# Patient Record
Sex: Female | Born: 1935 | Race: White | Hispanic: No | Smoking: Former smoker
Health system: Southern US, Community
[De-identification: ages and names within clinical notes are randomized; demographics above are authoritative.]

## PROBLEM LIST (undated history)

## (undated) DIAGNOSIS — I872 Venous insufficiency (chronic) (peripheral): Secondary | ICD-10-CM

## (undated) DIAGNOSIS — I350 Nonrheumatic aortic (valve) stenosis: Secondary | ICD-10-CM

## (undated) DIAGNOSIS — Z7901 Long term (current) use of anticoagulants: Secondary | ICD-10-CM

## (undated) DIAGNOSIS — G459 Transient cerebral ischemic attack, unspecified: Secondary | ICD-10-CM

## (undated) DIAGNOSIS — I482 Chronic atrial fibrillation, unspecified: Secondary | ICD-10-CM

## (undated) DIAGNOSIS — Z950 Presence of cardiac pacemaker: Secondary | ICD-10-CM

## (undated) DIAGNOSIS — E079 Disorder of thyroid, unspecified: Secondary | ICD-10-CM

## (undated) DIAGNOSIS — S42009A Fracture of unspecified part of unspecified clavicle, initial encounter for closed fracture: Secondary | ICD-10-CM

## (undated) DIAGNOSIS — M1990 Unspecified osteoarthritis, unspecified site: Secondary | ICD-10-CM

## (undated) DIAGNOSIS — T4145XA Adverse effect of unspecified anesthetic, initial encounter: Secondary | ICD-10-CM

## (undated) DIAGNOSIS — I499 Cardiac arrhythmia, unspecified: Secondary | ICD-10-CM

## (undated) DIAGNOSIS — T8859XA Other complications of anesthesia, initial encounter: Secondary | ICD-10-CM

## (undated) DIAGNOSIS — I495 Sick sinus syndrome: Secondary | ICD-10-CM

## (undated) HISTORY — PX: INSERT / REPLACE / REMOVE PACEMAKER: SUR710

## (undated) HISTORY — PX: ABDOMINAL HYSTERECTOMY: SHX81

## (undated) HISTORY — PX: REPLACEMENT TOTAL KNEE BILATERAL: SUR1225

## (undated) HISTORY — PX: VARICOSE VEIN SURGERY: SHX832

## (undated) HISTORY — DX: Venous insufficiency (chronic) (peripheral): I87.2

## (undated) HISTORY — DX: Sick sinus syndrome: I49.5

## (undated) HISTORY — PX: PACEMAKER INSERTION: SHX728

## (undated) HISTORY — DX: Nonrheumatic aortic (valve) stenosis: I35.0

## (undated) HISTORY — DX: Long term (current) use of anticoagulants: Z79.01

## (undated) HISTORY — DX: Unspecified osteoarthritis, unspecified site: M19.90

## (undated) HISTORY — DX: Disorder of thyroid, unspecified: E07.9

## (undated) HISTORY — DX: Chronic atrial fibrillation, unspecified: I48.20

## (undated) HISTORY — DX: Presence of cardiac pacemaker: Z95.0

## (undated) HISTORY — DX: Transient cerebral ischemic attack, unspecified: G45.9

## (undated) HISTORY — PX: KNEE SURGERY: SHX244

## (undated) HISTORY — DX: Cardiac arrhythmia, unspecified: I49.9

---

## 1998-12-19 ENCOUNTER — Ambulatory Visit (HOSPITAL_COMMUNITY): Admission: RE | Admit: 1998-12-19 | Discharge: 1998-12-19 | Payer: Self-pay | Admitting: Obstetrics & Gynecology

## 1998-12-23 ENCOUNTER — Encounter: Payer: Self-pay | Admitting: Orthopedic Surgery

## 1998-12-30 ENCOUNTER — Inpatient Hospital Stay (HOSPITAL_COMMUNITY): Admission: RE | Admit: 1998-12-30 | Discharge: 1999-01-06 | Payer: Self-pay | Admitting: Orthopedic Surgery

## 1999-04-06 ENCOUNTER — Ambulatory Visit (HOSPITAL_COMMUNITY): Admission: RE | Admit: 1999-04-06 | Discharge: 1999-04-06 | Payer: Self-pay | Admitting: Cardiology

## 1999-05-29 ENCOUNTER — Inpatient Hospital Stay (HOSPITAL_COMMUNITY): Admission: RE | Admit: 1999-05-29 | Discharge: 1999-06-01 | Payer: Self-pay | Admitting: Orthopedic Surgery

## 1999-06-01 ENCOUNTER — Inpatient Hospital Stay (HOSPITAL_COMMUNITY)
Admission: RE | Admit: 1999-06-01 | Discharge: 1999-06-08 | Payer: Self-pay | Admitting: Physical Medicine & Rehabilitation

## 2001-08-03 ENCOUNTER — Other Ambulatory Visit: Admission: RE | Admit: 2001-08-03 | Discharge: 2001-08-03 | Payer: Self-pay | Admitting: *Deleted

## 2001-08-16 ENCOUNTER — Encounter: Payer: Self-pay | Admitting: Orthopedic Surgery

## 2001-08-16 ENCOUNTER — Encounter: Admission: RE | Admit: 2001-08-16 | Discharge: 2001-08-16 | Payer: Self-pay | Admitting: Orthopedic Surgery

## 2002-01-07 ENCOUNTER — Emergency Department (HOSPITAL_COMMUNITY): Admission: EM | Admit: 2002-01-07 | Discharge: 2002-01-07 | Payer: Self-pay | Admitting: Emergency Medicine

## 2002-10-22 ENCOUNTER — Emergency Department (HOSPITAL_COMMUNITY): Admission: EM | Admit: 2002-10-22 | Discharge: 2002-10-22 | Payer: Self-pay | Admitting: Emergency Medicine

## 2003-01-04 ENCOUNTER — Emergency Department (HOSPITAL_COMMUNITY): Admission: EM | Admit: 2003-01-04 | Discharge: 2003-01-04 | Payer: Self-pay | Admitting: Emergency Medicine

## 2003-01-04 ENCOUNTER — Encounter: Payer: Self-pay | Admitting: Emergency Medicine

## 2005-08-06 ENCOUNTER — Encounter: Admission: RE | Admit: 2005-08-06 | Discharge: 2005-08-06 | Payer: Self-pay | Admitting: Cardiology

## 2007-09-29 ENCOUNTER — Encounter: Admission: RE | Admit: 2007-09-29 | Discharge: 2007-09-29 | Payer: Self-pay | Admitting: Cardiology

## 2008-01-08 ENCOUNTER — Ambulatory Visit: Payer: Self-pay | Admitting: Vascular Surgery

## 2008-01-26 ENCOUNTER — Ambulatory Visit: Payer: Self-pay | Admitting: Vascular Surgery

## 2008-03-29 ENCOUNTER — Ambulatory Visit: Payer: Self-pay | Admitting: Vascular Surgery

## 2008-04-24 ENCOUNTER — Ambulatory Visit: Payer: Self-pay | Admitting: Vascular Surgery

## 2008-05-01 ENCOUNTER — Ambulatory Visit: Payer: Self-pay | Admitting: Vascular Surgery

## 2008-08-05 ENCOUNTER — Encounter: Admission: RE | Admit: 2008-08-05 | Discharge: 2008-08-05 | Payer: Self-pay | Admitting: Cardiology

## 2009-04-14 ENCOUNTER — Encounter: Admission: RE | Admit: 2009-04-14 | Discharge: 2009-04-14 | Payer: Self-pay | Admitting: Cardiology

## 2009-04-23 ENCOUNTER — Ambulatory Visit (HOSPITAL_COMMUNITY): Admission: RE | Admit: 2009-04-23 | Discharge: 2009-04-23 | Payer: Self-pay | Admitting: Cardiology

## 2009-07-31 ENCOUNTER — Ambulatory Visit (HOSPITAL_COMMUNITY): Admission: RE | Admit: 2009-07-31 | Discharge: 2009-07-31 | Payer: Self-pay | Admitting: Internal Medicine

## 2009-11-03 ENCOUNTER — Encounter (INDEPENDENT_AMBULATORY_CARE_PROVIDER_SITE_OTHER): Payer: Self-pay | Admitting: Surgery

## 2009-11-03 ENCOUNTER — Ambulatory Visit (HOSPITAL_COMMUNITY): Admission: RE | Admit: 2009-11-03 | Discharge: 2009-11-04 | Payer: Self-pay | Admitting: Surgery

## 2010-03-05 ENCOUNTER — Ambulatory Visit (HOSPITAL_COMMUNITY)
Admission: RE | Admit: 2010-03-05 | Discharge: 2010-03-05 | Payer: Self-pay | Source: Home / Self Care | Attending: Orthopedic Surgery | Admitting: Orthopedic Surgery

## 2010-05-27 ENCOUNTER — Other Ambulatory Visit: Payer: Self-pay | Admitting: Orthopedic Surgery

## 2010-05-27 ENCOUNTER — Encounter (HOSPITAL_COMMUNITY): Payer: Medicare Other

## 2010-05-27 ENCOUNTER — Ambulatory Visit (HOSPITAL_COMMUNITY)
Admission: RE | Admit: 2010-05-27 | Discharge: 2010-05-27 | Disposition: A | Discharge status: Home / Self Care | Payer: Medicare Other | Source: Ambulatory Visit | Attending: Orthopedic Surgery | Admitting: Orthopedic Surgery

## 2010-05-27 ENCOUNTER — Other Ambulatory Visit (HOSPITAL_COMMUNITY): Payer: Self-pay | Admitting: Orthopedic Surgery

## 2010-05-27 DIAGNOSIS — J449 Chronic obstructive pulmonary disease, unspecified: Secondary | ICD-10-CM | POA: Insufficient documentation

## 2010-05-27 DIAGNOSIS — Z950 Presence of cardiac pacemaker: Secondary | ICD-10-CM | POA: Insufficient documentation

## 2010-05-27 DIAGNOSIS — I517 Cardiomegaly: Secondary | ICD-10-CM | POA: Insufficient documentation

## 2010-05-27 DIAGNOSIS — Z01812 Encounter for preprocedural laboratory examination: Secondary | ICD-10-CM | POA: Insufficient documentation

## 2010-05-27 DIAGNOSIS — Z01818 Encounter for other preprocedural examination: Secondary | ICD-10-CM

## 2010-05-27 DIAGNOSIS — J4489 Other specified chronic obstructive pulmonary disease: Secondary | ICD-10-CM | POA: Insufficient documentation

## 2010-05-27 DIAGNOSIS — I4891 Unspecified atrial fibrillation: Secondary | ICD-10-CM | POA: Insufficient documentation

## 2010-05-27 LAB — URINALYSIS, ROUTINE W REFLEX MICROSCOPIC
Bilirubin Urine: NEGATIVE
Glucose, UA: NEGATIVE mg/dL
Ketones, ur: NEGATIVE mg/dL
Protein, ur: NEGATIVE mg/dL
pH: 7.5 (ref 5.0–8.0)

## 2010-05-27 LAB — CBC
MCHC: 31.6 g/dL (ref 30.0–36.0)
MCV: 82.3 fL (ref 78.0–100.0)
Platelets: 351 10*3/uL (ref 150–400)
WBC: 8.2 10*3/uL (ref 4.0–10.5)

## 2010-05-27 LAB — COMPREHENSIVE METABOLIC PANEL
AST: 15 U/L (ref 0–37)
Albumin: 3.8 g/dL (ref 3.5–5.2)
Alkaline Phosphatase: 86 U/L (ref 39–117)
BUN: 13 mg/dL (ref 6–23)
CO2: 30 mEq/L (ref 19–32)
Calcium: 9 mg/dL (ref 8.4–10.5)
Chloride: 98 mEq/L (ref 96–112)
GFR calc non Af Amer: >60 mL/min (ref >60)
Glucose, Bld: 91 mg/dL (ref 70–99)
Total Bilirubin: 0.5 mg/dL (ref 0.3–1.2)
Total Protein: 7.7 g/dL (ref 6.0–8.3)

## 2010-05-27 LAB — PROTIME-INR: Prothrombin Time: 27 seconds — ABNORMAL HIGH (ref 11.6–15.2)

## 2010-05-27 LAB — SURGICAL PCR SCREEN: MRSA, PCR: NEGATIVE

## 2010-06-03 ENCOUNTER — Other Ambulatory Visit: Payer: Self-pay | Admitting: Orthopedic Surgery

## 2010-06-03 ENCOUNTER — Inpatient Hospital Stay (HOSPITAL_COMMUNITY)
Admission: RE | Admit: 2010-06-03 | Discharge: 2010-06-08 | DRG: 468 | Disposition: A | Discharge status: Home Health | Payer: Medicare Other | Source: Ambulatory Visit | Attending: Orthopedic Surgery | Admitting: Orthopedic Surgery

## 2010-06-03 DIAGNOSIS — E039 Hypothyroidism, unspecified: Secondary | ICD-10-CM | POA: Diagnosis present

## 2010-06-03 DIAGNOSIS — M1710 Unilateral primary osteoarthritis, unspecified knee: Secondary | ICD-10-CM | POA: Diagnosis present

## 2010-06-03 DIAGNOSIS — I4891 Unspecified atrial fibrillation: Secondary | ICD-10-CM | POA: Diagnosis present

## 2010-06-03 DIAGNOSIS — Z950 Presence of cardiac pacemaker: Secondary | ICD-10-CM

## 2010-06-03 DIAGNOSIS — T84099A Other mechanical complication of unspecified internal joint prosthesis, initial encounter: Principal | ICD-10-CM | POA: Diagnosis present

## 2010-06-03 DIAGNOSIS — Z96659 Presence of unspecified artificial knee joint: Secondary | ICD-10-CM

## 2010-06-03 DIAGNOSIS — R0681 Apnea, not elsewhere classified: Secondary | ICD-10-CM | POA: Diagnosis not present

## 2010-06-03 DIAGNOSIS — Z01818 Encounter for other preprocedural examination: Secondary | ICD-10-CM

## 2010-06-03 DIAGNOSIS — Y831 Surgical operation with implant of artificial internal device as the cause of abnormal reaction of the patient, or of later complication, without mention of misadventure at the time of the procedure: Secondary | ICD-10-CM | POA: Diagnosis present

## 2010-06-03 LAB — GRAM STAIN

## 2010-06-03 LAB — ABO/RH: ABO/RH(D): O NEG

## 2010-06-04 LAB — CBC
Hemoglobin: 9.3 g/dL — ABNORMAL LOW (ref 12.0–15.0)
MCH: 25.4 pg — ABNORMAL LOW (ref 26.0–34.0)
Platelets: 242 10*3/uL (ref 150–400)
RBC: 3.66 MIL/uL — ABNORMAL LOW (ref 3.87–5.11)
RDW: 15.7 % — ABNORMAL HIGH (ref 11.5–15.5)

## 2010-06-04 LAB — BASIC METABOLIC PANEL
BUN: 12 mg/dL (ref 6–23)
CO2: 28 mEq/L (ref 19–32)
Chloride: 100 mEq/L (ref 96–112)
Creatinine, Ser: 0.69 mg/dL (ref 0.4–1.2)
GFR calc Af Amer: >60 mL/min (ref >60)
Glucose, Bld: 166 mg/dL — ABNORMAL HIGH (ref 70–99)

## 2010-06-04 LAB — PROTIME-INR: Prothrombin Time: 17.1 seconds — ABNORMAL HIGH (ref 11.6–15.2)

## 2010-06-05 LAB — BASIC METABOLIC PANEL
BUN: 7 mg/dL (ref 6–23)
BUN: 14 mg/dL (ref 6–23)
CO2: 29 mEq/L (ref 19–32)
Chloride: 96 mEq/L (ref 96–112)
Creatinine, Ser: 0.68 mg/dL (ref 0.4–1.2)
GFR calc Af Amer: >60 mL/min (ref >60)
GFR calc non Af Amer: >60 mL/min (ref >60)
GFR calc non Af Amer: >60 mL/min (ref >60)
Glucose, Bld: 124 mg/dL — ABNORMAL HIGH (ref 70–99)
Glucose, Bld: 93 mg/dL (ref 70–99)
Potassium: 4.4 mEq/L (ref 3.5–5.1)

## 2010-06-05 LAB — PROTIME-INR
INR: 1.97 — ABNORMAL HIGH (ref 0.00–1.49)
INR: 1.26 (ref 0.00–1.49)
Prothrombin Time: 16 seconds — ABNORMAL HIGH (ref 11.6–15.2)

## 2010-06-05 LAB — CBC
HCT: 29.5 % — ABNORMAL LOW (ref 36.0–46.0)
HCT: 38.7 % (ref 36.0–46.0)
Hemoglobin: 9.2 g/dL — ABNORMAL LOW (ref 12.0–15.0)
Hemoglobin: 13 g/dL (ref 12.0–15.0)
MCH: 28.1 pg (ref 26.0–34.0)
MCHC: 33.6 g/dL (ref 30.0–36.0)
MCV: 82.2 fL (ref 78.0–100.0)
RBC: 3.59 MIL/uL — ABNORMAL LOW (ref 3.87–5.11)
RDW: 16.6 % — ABNORMAL HIGH (ref 11.5–15.5)
WBC: 11 10*3/uL — ABNORMAL HIGH (ref 4.0–10.5)

## 2010-06-05 LAB — APTT: aPTT: 33 seconds (ref 24–37)

## 2010-06-05 LAB — URINALYSIS, ROUTINE W REFLEX MICROSCOPIC
Bilirubin Urine: NEGATIVE
Hgb urine dipstick: NEGATIVE
Ketones, ur: NEGATIVE mg/dL
Nitrite: NEGATIVE
Urobilinogen, UA: 0.2 mg/dL (ref 0.0–1.0)
pH: 7 (ref 5.0–8.0)

## 2010-06-05 LAB — DIFFERENTIAL
Basophils Absolute: 0.1 10*3/uL (ref 0.0–0.1)
Basophils Relative: 1 % (ref 0–1)
Eosinophils Absolute: 0.1 10*3/uL (ref 0.0–0.7)
Eosinophils Relative: 1 % (ref 0–5)
Monocytes Absolute: 0.8 10*3/uL (ref 0.1–1.0)
Monocytes Relative: 12 % (ref 3–12)
Neutro Abs: 4.8 10*3/uL (ref 1.7–7.7)

## 2010-06-05 LAB — SURGICAL PCR SCREEN: Staphylococcus aureus: NEGATIVE

## 2010-06-05 LAB — CALCIUM: Calcium: 9.6 mg/dL (ref 8.4–10.5)

## 2010-06-06 LAB — TISSUE CULTURE

## 2010-06-06 LAB — CBC
HCT: 28.7 % — ABNORMAL LOW (ref 36.0–46.0)
Hemoglobin: 9.2 g/dL — ABNORMAL LOW (ref 12.0–15.0)
MCH: 26.1 pg (ref 26.0–34.0)
MCHC: 32.1 g/dL (ref 30.0–36.0)
MCV: 81.3 fL (ref 78.0–100.0)
RBC: 3.53 MIL/uL — ABNORMAL LOW (ref 3.87–5.11)

## 2010-06-06 LAB — BASIC METABOLIC PANEL
CO2: 28 mEq/L (ref 19–32)
Calcium: 8 mg/dL — ABNORMAL LOW (ref 8.4–10.5)
Chloride: 102 mEq/L (ref 96–112)
Creatinine, Ser: 0.75 mg/dL (ref 0.4–1.2)
GFR calc Af Amer: >60 mL/min (ref >60)
Glucose, Bld: 102 mg/dL — ABNORMAL HIGH (ref 70–99)

## 2010-06-06 LAB — PROTIME-INR: INR: 1.76 — ABNORMAL HIGH (ref 0.00–1.49)

## 2010-06-06 LAB — WOUND CULTURE

## 2010-06-08 LAB — ANAEROBIC CULTURE

## 2010-06-08 LAB — PROTIME-INR
INR: 1.45 (ref 0.00–1.49)
Prothrombin Time: 17.8 seconds — ABNORMAL HIGH (ref 11.6–15.2)

## 2010-06-10 NOTE — Op Note (Signed)
NAMEREX, MAGEE          ACCOUNT NO.:  [id number]  MEDICAL RECORD NO.:  [id number]           PATIENT TYPE:  I  LOCATION:  0006                         FACILITY:  [facility]  PHYSICIAN:  Ollen Gross, M.D.    DATE OF BIRTH:  11/25/1930  DATE OF PROCEDURE: DATE OF DISCHARGE:                              OPERATIVE REPORT   PREOPERATIVE DIAGNOSIS:  Failed left total knee arthroplasty.  POSTOPERATIVE DIAGNOSIS:  Failed left total knee arthroplasty.  PROCEDURE:  Left total knee arthroplasty revision.  SURGEON:  Ollen Gross, MD  ASSISTANT:  Alexzandrew L. Perkins, [facility]  ANESTHESIA:  General.  BLOOD LOSS:  Minimal.  DRAIN:  Hemovac x1.  TOURNIQUET TIME:  UP 38 minutes and 300 mmHg; down 8 minutes, up additional 22 minutes at 300 mmHg.  COMPLICATIONS:  None.  CONDITION:  Stable to recovery.  BRIEF CLINICAL NOTE:  Ms. Swor is a 77 year old female, who had a left total knee arthroplasty done in 2001.  She did very well until about a year or 2 ago when she started developing discomfort with weightbearing.  She had a few episodes of bloody effusion.  Infection workup had been negative.  Bone scan suggested loosening of the knee. She presents now for revision versus resection arthroplasty.  PROCEDURE IN DETAIL:  After successful administration of general anesthetic, a tourniquet was placed on the left thigh and left lower extremity prepped and draped in the usual sterile fashion.  Extremities wrapped in Esmarch, knee flexed, tourniquet inflated to 300 mmHg. Midline incision was made with 10 blade through subcutaneous tissue to the level of the extensor mechanism.  Fresh blade used to make a medial parapatellar arthrotomy.  There was some bloody fluid present in the knee but no purulence.  This was sent for stat Gram stain which was negative.  There were moderate white cells and no organisms.  There is a lot of hemosiderin stained synovium in the joint.  There was  evidence of some gapping between the femoral component and bone on the medial side. I probed in this and there was some hemosiderin stained tissue that came out.  I did a thorough synovectomy and sent this also for Gram stain which showed no organisms, no white cells.  I sent this for frozen section also which showed scattered foci of the 5 neutrophils but no fields of greater than 10 neutrophils per high-power field.  We discussed this with pathology and he did see areas of 5 but not 10.  I then removed the tibial polyethylene from the tibial tray.  I fixed bearing tray.  There is evidence of significant wear around the post of the posterior stabilized portion.  This was consistent with some rotational motion and impingement of the post on the femur.  This very well could have created polyethylene debris leading to the osteolysis.  We decided to remove the complements disrupting interface first between the femoral component bone, removing it with minimal bone loss.  There was significant cavitary defect on the medial side as well as a smaller defect on lateral side.  Fortunately, the cortex remained intact.  I then removed the tibial component and there  was small cavitary defect on the medial side and then going down into the metaphysis, but again the cortical rim was intact.  We thoroughly debrided all of the lytic tissue from the defects.  I then removed the remainder of the cement from the tibial canal.  We then thoroughly irrigated both canals, then reamed on the femoral side up to 18 mm and the tibial side up to 13 mm.  Tibia subluxed forward and retractors placed.  I decided to place a tibial sleeve given the cavitary defect and blotched up to a size 45. This had excellent rotational stability.  We then made the cut off of the sleeve to get a flat platform for placement of the component.  She had a size 3 and then we placed a size 3 and did revision trial with a 45 sleeve in  neutral position and 13 x 30 stem extension.  This had excellent fit on the cut tibial bone surface.  Then placed 18 mm reamer to serve as our intramedullary guide to the distal femoral cut.  We did 5 degrees of valgus.  I went into the +4 position to cut the bone and then placed 4 mm augments medial and lateral distally.  We then broached for the sleeve on the femur and taken up to 34 mm sleeve.  We impacted that in the appropriate depth of the TC3 component.  The size 3 is the most appropriate AP cutting block and the rotation is marked by placing a spacer block in the flexion space.  The anterior and posterior cuts are made taking minimal bone. Impacted in a +4 position posteriorly, both medial and lateral, thus needing 4 mm medial and lateral augments posteriorly.  The intercondylar block is placed and neck cuts made with TC3.  The trial was then configured which is a size 3 TC3 femur with a 18 x 75 stem in a +2 position with 4 mm medial and lateral distal augments, 4 mm medial and lateral posterior augments, and then the 34 sleeve set in slight external rotation.  We placed both trial components and we had excellent fit on the cut bone surfaces.  A 15 mm TC3 rotating platform insert is placed.  Full extensions achieved with excellent varus-valgus anterior- posterior balance throughout the full range of motion.  The patella is then inspected and we did patelloplasty removing all the tissue that was covering the patella.  The components in good condition, in good position.  Tracked normally.  We then let the tourniquet down at 38 minutes, keeping down for 8 minutes while the components were assembled on the back table.  The components are of the same sizes and with the same augments that we did with the trials.  There was minimal bleeding in the joint, that bleeding was stopped with electrocautery.  After 8 minutes, we wrapped the leg in Esmarch and reinflated to 300 mmHg.  I then  removed the trials and placed a size 4 trial restrictor at the appropriate depth in tibial canal.  We thoroughly irrigated the joint about 2 L of saline with pulsatile lavage.  The components were assembled on the back table.  We mixed 3 batches of gentamicin impregnated cement and once ready for implantation, injected into the tibial canal and impacted the tibial component, removed all extruded cement.  We then cemented the femoral component distally and had a Press- Fit stem and sleeve.  The components impacted and extruded cement removed.  A 15 mm trial  was placed, knee held in full extension, and any additional extruded cement was removed.  When the cement fully hardened, we placed a permanent 15 mm TC3 rotating platform insert into the tibial tray.  Wound was then copiously irrigated with saline solution.  The arthrotomy closed over Hemovac drain with interrupted #1 PDS.  Flexion against gravity about 135 degrees.  Patella tracks normally.  Tourniquet was released.  Second tourniquet time was 22 minutes.  Subcu was closed with interrupted 2-0 Vicryl and skin with staples.  Catheter for Marcaine pain pump is placed and pumps initiated.  The drains hooked to suction.  A bulky sterile dressing was applied.  She is placed into a knee immobilizer, awakened and transported to recovery in stable condition.     Ollen Gross, M.D.     FA/MEDQ  D:  06/03/2010  T:  06/03/2010  Job:  [id number]  Electronically Signed by Ollen Gross M.D. on 06/10/2010 08:19:09 AM

## 2010-06-12 LAB — BASIC METABOLIC PANEL
BUN: 18 mg/dL (ref 6–23)
Chloride: 102 mEq/L (ref 96–112)
GFR calc non Af Amer: >60 mL/min (ref >60)
Glucose, Bld: 96 mg/dL (ref 70–99)
Potassium: 4.7 mEq/L (ref 3.5–5.1)
Sodium: 136 mEq/L (ref 135–145)

## 2010-06-12 LAB — APTT: aPTT: 32 seconds (ref 24–37)

## 2010-06-12 LAB — PROTIME-INR: Prothrombin Time: 16.8 seconds — ABNORMAL HIGH (ref 11.6–15.2)

## 2010-06-12 LAB — CBC
HCT: 38.4 % (ref 36.0–46.0)
Hemoglobin: 12.9 g/dL (ref 12.0–15.0)
Platelets: 251 10*3/uL (ref 150–400)
RDW: 15.8 % — ABNORMAL HIGH (ref 11.5–15.5)
WBC: 8.4 10*3/uL (ref 4.0–10.5)

## 2010-06-15 NOTE — H&P (Signed)
[name], [name]          ACCOUNT NO.:  [id number]  MEDICAL RECORD NO.:  [id number]           PATIENT TYPE:  I  LOCATION:  1236                         FACILITY:  [facility]  PHYSICIAN:  Ollen Gross, M.D.    DATE OF BIRTH:  [birth year]  DATE OF ADMISSION:  06/03/2010 DATE OF DISCHARGE:                             HISTORY & PHYSICAL   CHIEF COMPLAINT:  Left knee pain.  HISTORY OF PRESENT ILLNESS:  The patient is a 77 year old female well known to Dr. Lequita Halt having previously been treated for ongoing left knee pain.  She has a left total knee and has been in for many years now and started developing some swelling and continued problems with ambulation.  X-ray shows apparent lucency under the anterior flange of the femur.  Bone scan showed some uptake in all 3 phases and felt that she probably had some mechanical symptoms possibly loosening with no obvious signs of infection.  It was felt that she would benefit undergoing revision versus resection depending on the operative findings and subsequently admitted to the hospital.  ALLERGIES:  PENICILLIN.  CURRENT MEDICATIONS: 1. Thyroid medication. 2. Digoxin. 3. Diltiazem. 4. Celebrex. 5. Oxybutynin. 6. Coumadin. 7. Sertraline.  PAST MEDICAL HISTORY: 1. History of shingles. 2. Depression. 3. Past history of bronchitis. 4. Past history of pneumonia. 5. Valvular heart disease with cardiac murmur. 6. Chronic atrial fibrillation. 7. Status post pacemaker placement. 8. Hiatal hernia. 9. Hypothyroidism. 10.Past history of superficial phlebitis (no DVT or PE). 11.History of cystitis. 12.Degenerative disk disease.  CHILDHOOD ILLNESSES: 1. Measles. 2. Mumps.  PAST SURGICAL HISTORY: 1. Hysterectomy. 2. Gallbladder surgery. 3. Aneurysm repair. 4. Cataracts with lens implants. 5. Varicose veins surgery. 6. Pacemaker placement x3. 7. Parathyroid removal. 8. Knee replacement.  FAMILY HISTORY:  Father deceased at age  33 with heart disease and diabetes.  Mother deceased at 29 with diabetes and mental illness.  SOCIAL HISTORY:  Widowed.  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  Past smoker.  No alcohol.  She would like to go home following her surgery and try and arrange some assistance and care at home.  She does have healthcare power of attorney.  REVIEW OF SYSTEMS:  GENERAL:  No fevers or chills, occasional night sweats.  NEURO:  Little bit of blurred vision.  No seizures or syncope. RESPIRATORY:  No shortness breath, productive cough, or hemoptysis. CARDIOVASCULAR:  No chest pain, PND, or orthopnea.  GI:  No nausea, vomiting, diarrhea, or constipation.  GU:  Little bit of frequency, nocturia, and some incontinence.  No dysuria or hematuria. MUSCULOSKELETAL:  Knee pain.  PHYSICAL EXAMINATION:  VITAL SIGNS:  Pulse 68, respirations 12, and blood pressure 124/68. GENERAL:  A 77 year old white female well nourished, well developed, petite frame, alert, oriented and cooperative, good historian. HEENT:  Normocephalic, atraumatic.  Pupils are round and reactive.  EOMs intact. NECK:  Supple. CHEST:  Clear anterior posterior chest wall.  She does have somewhat of a kyphotic thoracic region.  She has a right-sided chest wall pacemaker. HEART:  Regular rate and rhythm with a grade 2-3/6 pansystolic ejection murmur. ABDOMEN:  Soft, nontender.  Bowel sounds  present. RECTAL, BREASTS, AND GENITALIA:  Not done, not pertinent to present illness. EXTREMITIES:  Left knee, no effusion.  No obvious erythema or warmth. Range of motion 0-125, slight laxity with varus and valgus but no gross instability.  IMPRESSION:  Left knee pain.  PLAN:  The patient admitted to [facility] to undergo revision versus resection of her left total knee.  Surgery will be performed by Ollen Gross.     Alexzandrew L. Julien Girt, P.A.C.   ______________________________ Ollen Gross, M.D.    ALP/MEDQ   D:  06/04/2010  T:  06/04/2010  Job:  [id number]  cc:   Geoffry Paradise, M.D. Fax: [phone]  W. Viann Fish, M.D. Fax: [phone] Email: [email or web address]  Electronically Signed by Patrica Duel P.A.C. on 06/12/2010 10:30:34 AM Electronically Signed by Ollen Gross M.D. on 06/15/2010 07:10:51 AM

## 2010-07-15 ENCOUNTER — Emergency Department (HOSPITAL_COMMUNITY): Payer: Medicare Other

## 2010-07-15 ENCOUNTER — Emergency Department (HOSPITAL_COMMUNITY)
Admission: EM | Admit: 2010-07-15 | Discharge: 2010-07-15 | Disposition: A | Discharge status: Home / Self Care | Payer: Medicare Other | Attending: Emergency Medicine | Admitting: Emergency Medicine

## 2010-07-15 DIAGNOSIS — Z950 Presence of cardiac pacemaker: Secondary | ICD-10-CM | POA: Insufficient documentation

## 2010-07-15 DIAGNOSIS — E039 Hypothyroidism, unspecified: Secondary | ICD-10-CM | POA: Insufficient documentation

## 2010-07-15 DIAGNOSIS — F411 Generalized anxiety disorder: Secondary | ICD-10-CM | POA: Insufficient documentation

## 2010-07-15 DIAGNOSIS — I10 Essential (primary) hypertension: Secondary | ICD-10-CM | POA: Insufficient documentation

## 2010-07-15 DIAGNOSIS — Z79899 Other long term (current) drug therapy: Secondary | ICD-10-CM | POA: Insufficient documentation

## 2010-07-15 DIAGNOSIS — S0120XA Unspecified open wound of nose, initial encounter: Secondary | ICD-10-CM | POA: Insufficient documentation

## 2010-07-15 DIAGNOSIS — Y929 Unspecified place or not applicable: Secondary | ICD-10-CM | POA: Insufficient documentation

## 2010-07-15 DIAGNOSIS — I4891 Unspecified atrial fibrillation: Secondary | ICD-10-CM | POA: Insufficient documentation

## 2010-07-15 DIAGNOSIS — M25569 Pain in unspecified knee: Secondary | ICD-10-CM | POA: Insufficient documentation

## 2010-07-15 DIAGNOSIS — Z7901 Long term (current) use of anticoagulants: Secondary | ICD-10-CM | POA: Insufficient documentation

## 2010-07-15 DIAGNOSIS — W06XXXA Fall from bed, initial encounter: Secondary | ICD-10-CM | POA: Insufficient documentation

## 2010-07-15 DIAGNOSIS — R51 Headache: Secondary | ICD-10-CM | POA: Insufficient documentation

## 2010-07-15 DIAGNOSIS — S022XXA Fracture of nasal bones, initial encounter for closed fracture: Secondary | ICD-10-CM | POA: Insufficient documentation

## 2010-07-15 DIAGNOSIS — D649 Anemia, unspecified: Secondary | ICD-10-CM | POA: Insufficient documentation

## 2010-07-15 LAB — BASIC METABOLIC PANEL
BUN: 16 mg/dL (ref 6–23)
CO2: 28 mEq/L (ref 19–32)
Chloride: 98 mEq/L (ref 96–112)
Creatinine, Ser: 0.84 mg/dL (ref 0.4–1.2)
Glucose, Bld: 107 mg/dL — ABNORMAL HIGH (ref 70–99)
Potassium: 3.9 mEq/L (ref 3.5–5.1)

## 2010-07-15 LAB — PROTIME-INR
INR: 2.52 — ABNORMAL HIGH (ref 0.00–1.49)
Prothrombin Time: 27.3 seconds — ABNORMAL HIGH (ref 11.6–15.2)

## 2010-07-15 LAB — CBC
Hemoglobin: 11 g/dL — ABNORMAL LOW (ref 12.0–15.0)
Platelets: 302 10*3/uL (ref 150–400)
RBC: 4.23 MIL/uL (ref 3.87–5.11)
WBC: 6.5 10*3/uL (ref 4.0–10.5)

## 2010-07-15 LAB — DIFFERENTIAL
Basophils Relative: 1 % (ref 0–1)
Eosinophils Absolute: 0.3 10*3/uL (ref 0.0–0.7)
Neutro Abs: 3.7 10*3/uL (ref 1.7–7.7)
Neutrophils Relative %: 57 % (ref 43–77)

## 2010-08-04 NOTE — Assessment & Plan Note (Signed)
OFFICE VISIT   [name], [name]  DOB:  12-Jun-1930                                       01/26/2008  CHART#:[id number]   The patient presents today for evaluation of right leg venous pathology.  She is a very pleasant 77 year old white female with a long history of  right leg venous difficulties.  She had undergone ligation and stripping  of tributary varicosities by me at [facility] in February  1996.  She had some progression over the following 13 years.  I had last  seen her in August 2007.  She had a venous ulcer at that time and had  eventual healing.  She did have remote history of bleeding prior to her  surgery in 1996 and has had no venous bleeding since that time.  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  She had been on  chronic Coumadin therapy for other indications, and had stopped this in  preparation for eye surgery.  She had extensive superficial  thrombophlebitis episode, which is slowly resolving.  Past history is  significant also for bilateral total knee replacement.  She has a  history of pacemaker and atrial fibrillation.   SOCIAL HISTORY:  She is widowed.  She does not smoke, having quit in  1965.  Does not drink alcohol.   REVIEW OF SYSTEMS:  GENERAL:  Multiple positives for weight loss, loss  of appetite.  Her weight is reported at 147 pounds.  She is 5 feet 2  inches tall.  CARDIAC:  Noted for palpitations, shortness of breath, atrial  fibrillation, and heart murmur.  PULMONARY:  Negative.  NEUROLOGIC:  She does have headaches and dizziness.  Does have eye  changes.   Her only medication allergy is penicillin.   PHYSICAL EXAM:  She does have 2+ dorsalis pedis pulses bilaterally.  She  has marked tributary varicosities in her right calf and also extending  up onto her thigh.  She does have a palpable thrombus in these  with  accompanying surrounding erythema.   She underwent formal venous duplex in our office on 01/08/2008 showing  no evidence of deep vein thrombosis.  She did have thrombus in her  varicosities.  The patient has been extremely diligent with her  graduated compression garments for many years.  She has had a difficult  time wearing these recently due to the severe pain associated with  placing these over the superficial thrombophlebitis.  On screening  duplex by me, she did have reflux in a saphenous vein from the knee to  the groin and did have communication of these with the thrombosed large  varices.  I explained to the patient that with the current thrombus in  her varices, that she could not have treatment of the thrombophlebitic  vein by removal currently.  I have recommended that we see her again in  2 months to determine the resolution of these.  I suspect that she will  recannulize these and continue to have difficulty, will require laser  ablation of her right greater saphenous vein and stab phlebectomy of  these very large multiple tributary varicosities.  We will make this  determination following her next visit in 2 months.  In the meantime,  she will continue with  her compression garments as much as possible as  the pain resolves.  I feel it is safe for her to proceed with eye  surgery as indicated, and I do not see any specific indication for  Coumadin regarding her venous pathology.   [name], M.D.  Electronically Signed   TFE/MEDQ  D:  01/26/2008  T:  01/29/2008  Job:  [id number]   cc:   Geoffry Paradise, M.D.

## 2010-08-04 NOTE — Assessment & Plan Note (Signed)
OFFICE VISIT   [name], [name]  DOB:  07-18-1930                                       03/29/2008  CHART#:[id number]   The patient presents today for continued follow-up of her extensive  right leg venous hypertension and varicose veins.  She continues to have  some resolution of the marked superficial thrombophlebitis throughout  her right medial calf.  Ultrasound reveals continued reflux in her great  saphenous vein from her groin down to the level of her knee, feeding  into all these marked varicosities.  She also has healed venous ulcers  over her right distal ankle.  She has worn thigh-high graduated  compression garments 20-30 mm for two years and continues to have  discomfort despite this. The patient has great difficulty with exercise  (walking and golfing) due to right leg pain. She also has difficulty  with housework and yardwork secondary to leg pain and swelling. I have  recommend that we proceed with laser ablation of her saphenous vein and  stab phlebectomy of her multiple tributary varicosities.  We will  schedule this at her earliest convenience.  I did explain that we would  stop her Coumadin for several days prior to this procedure and will  discuss this further with Dr. Jacky Kindle as well.  We will determine  whether she requires Lovenox bridge or can simply stop her Coumadin.  This was for chronic atrial fibrillation.   [name], M.D.  Electronically Signed   TFE/MEDQ  D:  03/29/2008  T:  04/02/2008  Job:  [id number]   cc:   Geoffry Paradise, M.D.

## 2010-08-04 NOTE — Procedures (Signed)
DUPLEX DEEP VENOUS EXAM - LOWER EXTREMITY   INDICATION:  Followup post greater saphenous vein ablation.   HISTORY:  Edema:  Right leg.  Trauma/Surgery:  Right greater saphenous vein laser ablation on  04/24/2008.  Pain:  Right leg pain.  PE:  No.  Previous DVT:  No.  Anticoagulants:  The patient is on Coumadin.  Other:  No.   DUPLEX EXAM:                CFV   SFV   PopV  PTV    GSV                R  L  R  L  R  L  R   L  R  L  Thrombosis    0     0     0     0      +  Spontaneous   +     +     +     +      0  Phasic        +     +     +     +      0  Augmentation  +     +     +     +      0  Compressible  +     +     +     +      0  Competent     0     0     0     +      0   Legend:  + - yes  o - no  p - partial  D - decreased   IMPRESSION:  1. Right greater saphenous vein is thrombosed.  2. Deep venous system is incompetent throughout the right leg.  Right      posterior tibial vein is competent.  3. No evidence of DVT in the right leg.  4. The anterior branch of the greater saphenous vein is nonphasic,      incompetent, patent and extremely tortuous at the proximal groin.    _____________________________  [name], M.D.   MC/MEDQ  D:  05/01/2008  T:  05/01/2008  Job:  [id number]

## 2010-08-04 NOTE — Procedures (Signed)
DUPLEX DEEP VENOUS EXAM - LOWER EXTREMITY   INDICATION:  Right lower extremity pain and swelling.   HISTORY:  Edema:  Yes.  Trauma/Surgery:  No.  Pain:  Yes.  PE:  No.  Previous DVT:  No.  Anticoagulants:  No.  Other:   DUPLEX EXAM:                CFV   SFV   PopV  PTV    GSV                R  L  R  L  R  L  R   L  R  L  Thrombosis    o  o  o     o     o      +  Spontaneous   +  +  +     +     +      0  Phasic        +  +  +     +     +      0  Augmentation  +  +  +     +     +      0  Compressible  +  +  +     +     +      0  Competent     +  +  +     +     +      0   Legend:  + - yes  o - no  p - partial  D - decreased   IMPRESSION:  1. No evidence of deep venous thrombosis noted in the right leg.  2. Thrombus noted in the right varicose vein around the knee area.  3. What appears to be a ruptured baker's cyst noted at the right      popliteal fossa.    _____________________________  [name], M.D.   MG/MEDQ  D:  01/08/2008  T:  01/08/2008  Job:  [id number]

## 2010-08-04 NOTE — Assessment & Plan Note (Signed)
OFFICE VISIT   [name], [name]  DOB:  03-29-1930                                       05/01/2008  CHART#:[id number]   The patient presents today 1 week followup of laser ablation of her  right great saphenous vein from her knee to her groin and multiple  tributary stab phlebectomies throughout her thigh and calf.  She has  done extremely well since her procedure.  She has mild bruising and  minimal discomfort associated with this.  The stab phlebectomy sites all  look quite good.  She has the usual amount of bruising.  I have  instructed her to continue wearing her compression garments in the  daytime only for an additional 1 week.  I plan to see her again in 6  weeks for final followup.   [name], M.D.  Electronically Signed   TFE/MEDQ  D:  05/01/2008  T:  05/02/2008  Job:  [id number]   cc:   Geoffry Paradise, M.D.  Georga Hacking, M.D.

## 2010-08-07 NOTE — Discharge Summary (Signed)
[place]  Patient:    [name], [name]                   MRN: [id number] Adm. Date:  06/01/99 Disc. Date: 06/08/99 Attending:  Faith Rogue, M.D. Dictator:   Dian Situ, PA CC:         Darden Palmer., M.D.             Trudee Grip, M.D.             Richard A. Jacky Kindle, M.D.                           Discharge Summary  DISCHARGE DIAGNOSES: 1. Status post left total knee replacement. 2. Postoperative anemia, stable. 3. Abnormal liver function tests, resolving. 4. History of chronic atrial fibrillation.  HISTORY OF PRESENT ILLNESS:  [name] is a 77 year old female with history of chronic atrial fibrillation, hypothyroidism, DJD left knee who elected to undergo left total knee replacement secondary to failure of conservative therapy.  Postoperatively was found to have problems with hyponatremia.  This has been treated and currently resolved.  Patient on Coumadin chronically and currently INR at 1.7.  She has been making steady progress, is minimal assistance for bed mobility, minimal assistance for transfers, supervision for ambulating 30 feet with a rolling walker.  Rehab consulted for progressive ambulation and independence issues.  PAST MEDICAL HISTORY:  Significant for chronic atrial fibrillation with permanent pacemaker and cardiomyopathy, depression, hypothyroidism, hyperlipidemia, headaches, hysterectomy, cholecystectomy, and nocturia, as well as a cystocele causing frequency.  ALLERGIES:  PENICILLIN.  SOCIAL HISTORY:  Patient lives in a split-level home with 8 to 14 steps totally inside.  She was independent prior to admission.  Does not use any alcohol and quit tobacco use in 1985.  HOSPITAL COURSE:  [name] was admitted to rehab on June 01, 1999 for inpatient therapies to consist of PT/OT daily.  Past admission, Lovenox was added until INR therapeutic level.  Patients voiding was  measured PVR ______ secondary to patients history of frequency and stress incontinence.  PVR volumes were noted to be low.  She was started on low-dose Ditropan, which has helped with her frequency symptoms.  Pain control was assisted with resumption of Celebrex.  She was started on Pepcid for GI prophylaxis to prevent stress ulcers.  Workup at admission showed hemoglobin 9.5, hematocrit 28.2, sodium 136, potassium 3.9, chloride 100, CO2 30, BUN 11, creatinine 0.7, AST 50, ALT 63, alkaline phos 133.  Her LFTs were rechecked on March 16 and noted to be improved, with AST of 29, ALT 44, ALP 133.  Her H&H has been followed along, as patient was noted to have one out of four positive stools.  Last check of March 16 shows hemoglobin 10.0, hematocrit 30.2. UA/UCS done on March 12 showed insignificant growth.  Patients left knee has healed well without any signs and symptoms of infection, no drainage, no erythema noted.  Patient is to follow-up with Dr. Despina Hick on Friday for a recheck.  By time of discharge patient had progressed to being moderately independent for self-care needs, including toileting as well as living skills.  She is independent for bed mobility, moderately independent for transfers, moderately independent for ambulating greater than 150 feet x 3 with rolling walker.  Her range of motion of her knee is at active range of motion.  She has knee flexion at 84 degrees.  She  is able to climb 12 stairs with close supervision.  Patients family is to provide assistance as needed past discharge.  Follow-up therapies in terms of home health, PT/OT, as well as R.N. has been set up through ______. Patients PT and INR at time of discharge is at 20.8 and 2.5 and patient is discharged on Coumadin 5 mg a day, which is her home dose.  Next pro time is to be checked by home health nurse on Thursday, with the results to Dr. Tawana Scale office.  On June 08, 1999 patient is discharged. DD:   06/08/99 TD:  06/09/99 Job: [id number]

## 2010-08-07 NOTE — Discharge Summary (Signed)
[place] [facility]  Patient:    [name], [name]                   MRN: [id number] Adm. Date:  06/01/99 Disc. Date: 06/08/99 Attending:  Faith Rogue, M.D. Dictator:   Dian Situ, PA CC:         Darden Palmer., M.D.             Trudee Grip, M.D.             Richard A. Jacky Kindle, M.D.                           Discharge Summary  DISCHARGE INSTRUCTIONS:  MEDICATIONS:  1. Celebrex 200 mg b.i.d.  2. Coumadin 5 mg p.o. q.p.m.  3. Ditropan 2.5 mg b.i.d.  4. Trinsicon 1 p.o. b.i.d.  5. Tiazac 240 mg q.d.  6. Lanoxin 0.25 mg q.d.  7. Robaxin 500 mg t.i.d. p.r.n. spasms.  8. Levothyroxine 200 mcg q.d.  9. Lescol 20 mg q.h.s. 10. Zoloft 50 mg 1.5 p.o. q.d. 11. Percocet 1-2 p.o. q.4-6h. p.r.n. pain.  ACTIVITY:  To use walker.  DIET:  Low fat.  WOUND CARE:  Keep area clean and dry.  SPECIAL INSTRUCTIONS:  No alcohol; no smoking; no driving; no aspirin ______, or NSAIDS while on Coumadin.  Home health R.N. to draw pro time Tuesday with the results to Dr. Donnie Aho.  FOLLOW-UP:  Patient to follow up with Dr. Despina Hick on Friday.  Follow up with Dr. Jacky Kindle per schedule.  Follow up with Dr. Riley Kill as needed. DD:  06/08/99 TD:  06/09/99 Job: [id number]

## 2010-08-07 NOTE — Procedures (Signed)
[place]  Patient:    [name]                  MRN: [id number] Proc. Date: 04/06/99 Adm. Date:  [id number] Attending:  Eleanora Neighbor CC:         Darden Palmer., M.D.             Richard A. Jacky Kindle, M.D.                           Procedure Report  REFERRING PHYSICIANS:  Darden Palmer., M.D. and Pearletha Furl. Jacky Kindle, M.D.  HISTORY:  Ms. Rubel has had a VVI pacemaker since 1992, which is showing end-of-life parameters.  She is admitted for generator exchange.  PROCEDURE PERFORMED:  Permanent pacemaker generator replacement of single-chamber system.  DESCRIPTION OF PROCEDURE:  The patient was prepped and draped.  The pulse generator was located on the right subclavicular area.  I infiltrated the area with 1% Xylocaine.  Sharp dissection and bovie dissection were used to explant the Intermedics 253-25 pulse generator, serial number [id number].  The pocket was enlarged slightly.  The pocket was flushed with kanamycin solution.  A new Medtronic VVIR pulse generator, Ensigma, model number E7012060, serial number [id number], was connected to the lead.  The unit was sutured in place with 2-0 and 5-0 Dexon and Steri-Strips were applied.  The thresholds on the old ventricular Intermedics lead, model 431-04, serial number [id number], that was implanted on April 05, 1990, revealed R-wave 9.6 mV, impedance 498 ohms, capture was at 1.2 V at 0.5 msec at 2.7 mA current.  The lead was a unipolar lead.  The patient was not pacer dependent.  The pulse generator that was removed was an Intermedics 453-25, serial number [id number] and it also was implanted on April 05, 1990. DD:  04/06/99 TD:  04/06/99 Job: [id number]

## 2010-08-20 NOTE — Discharge Summary (Addendum)
[name], [name]          ACCOUNT NO.:  [id number]  MEDICAL RECORD NO.:  [id number]           PATIENT TYPE:  I  LOCATION:  1606                         FACILITY:  [facility]  PHYSICIAN:  [name], M.D.    DATE OF BIRTH:  Dec 30, 1930  DATE OF ADMISSION:  06/03/2010 DATE OF DISCHARGE:  06/08/2010                              DISCHARGE SUMMARY   ADMITTING DIAGNOSES: 1. Painful left total knee. 2. History of shingles. 3. Depression. 4. Past history of bronchitis. 5. Past history of pneumonia. 6. Valvular heart disease with cardiac murmur. 7. Chronic atrial fibrillation. 8. Pacemaker placement. 9. Hiatal hernia. 10.Hypothyroidism. 11.History of superficial phlebitis (no deep venous thrombosis or no     pulmonary embolism). 12.History of cystitis. 13.Degenerative disk disease.  DISCHARGE DIAGNOSES: 1. Failed left total knee arthroplasty, status post left total knee     arthroplasty revision. 2. Postop hyponatremia, improved. 3. Painful left total knee. 4. History of shingles. 5. Depression. 6. Past history of bronchitis. 7. Past history of pneumonia. 8. Valvular heart disease with cardiac murmur. 9. Chronic atrial fibrillation. 10.Pacemaker placement. 11.Hiatal hernia. 12.Hypothyroidism. 13.History of superficial phlebitis (no deep venous thrombosis or no     pulmonary embolism). 14.History of cystitis. 15.Degenerative disk disease.  PROCEDURE:  On June 03, 2010, revision left total knee arthroplasty.  SURGEON:  [name], M.D.  ASSISTANT:  [name], P.A.C.  ANESTHESIA:  General.  TOURNIQUET TIME:  38 minutes and down for 8 minutes and an additional 22 minutes.  CONSULTS:  Medical, [name], M.D.  BRIEF HISTORY:  The patient is a 77 year old female with left total knee done back in 2001.  She did very well until about a year or two ago when she started developing discomfort with weightbearing.  She had a few episodes of  bloody effusion, infection, workup was negative.  Bone scan showed loosening, now presents for resection versus revision.  LABORATORY DATA:  The original CBC preop is not scanned into the chart, not available.  Her postop CBC showed hemoglobin 9.3, remained stable. Last time H and H shows 9.2 and 28.7.  Her PT/INR on the day of surgery was 16.9 and 1.35.  Serial prothrombin times followed per Coumadin protocol, last time PT/INR 17.8/1.45.  Admission Chem panel, not scanned into the chart, not available for dictation, but three BMETs were followed daily, postoperatively.  Sodium on postop day #1 was 134, dropped down to 130, back up to 135.  Remaining electrolytes remained within normal limits.  Glucose was elevated postop at 166, became down to normal level of 102.  Blood group type O negative.  Stat Gram stains taken at the time of surgery showed no organisms x2.  Wound culture showed no organisms on the smear and no growth.  Tissue culture showed no organisms on the smear, no growth.  Anaerobic culture x2 showed no organisms on both of the smears and no anaerobes were isolated.  HOSPITAL COURSE:  Patient was admitted to [facility], taken to OR, underwent above-stated procedure without complication, was later transferred to step-down due to periods of apnea following the procedure, afterwards remained in step-down and did fairly  well through the night.  Patient was seen in the morning of day #1 and O2 saturations were good, heart rate was good, pressure was good and we transferred out of step-down to a telemetry bed.  Her sodium was a little low at 134, felt to be a dilutional component.  Hemovac drain was pulled.  She was started on Coumadin for DVT prophylaxis and also given a Lovenox bridge. There were no beds available on telemetry, so she remained in step-down another day.  Patient was seen in consultation by Dr. Jacky [name] from a postop standpoint and from medical  standpoint followed very closely. She remained fairly stable after surgery.  By day #2, she had a good night, had some thigh pain from the tourniquet.  Her pressures were still stable.  Sodium was a little bit further down to 130.  Hemoglobin was stable though and all the cultures were negative.  Dressing changed on day #2 and the incision looked good, at that point, she was transferred up to a telemetry.  By postop day #3, she was slowly progressing with physical therapy.  She did want to go home.  She is seen over the weekend by the orthopedic cross coverage for Saturday and Sunday.  She remained in the hospital over the weekend.  Continued on her Coumadin, which was slowly titrating up, progressing with therapy, no complaints, and by postop day #5, patient seen back in rounds with Dr. Lequita Halt and still wanted to go home, but not quite ready, so she had 2 sessions of therapy.  When she met her goals, she was discharged home.  DISCHARGE/PLAN: 1. Patient discharged home on June 08, 2010. 2. Discharge diagnoses please see above.  DISCHARGE MEDICATIONS: 1. Lovenox. 2. Robaxin. 3. Tramadol. 4. Continue Cardizem. 5. Celebrex. 6. Digoxin. 7. Vicodin. 8. Oxybutynin. 9. Citrulline. 10.Synthroid. 11.Coumadin. 12.[employer]ch [employer] Lomb Advanced eye relief.  DIET:  Heart-healthy diet.  ACTIVITY:  Weightbearing as tolerated total hip protocol.  FOLLOWUP:  Home health PT, home health nursing, follow up in 2 weeks.  DISPOSITION:  Home.  CONDITION ON DISCHARGE:  Improved.     [name], P.A.C.   ______________________________ [name], M.D.    ALP/MEDQ  D:  08/18/2010  T:  08/18/2010  Job:  [id number]  cc:   [name], M.D. Fax: [phone]  Juleen China, MD  Electronically Signed by Patrica Duel P.A.C. on 08/20/2010 08:59:07 AM Electronically Signed by [name] M.D. on 08/22/2010 04:04:44 PM

## 2010-12-11 ENCOUNTER — Ambulatory Visit (HOSPITAL_COMMUNITY)
Admission: RE | Admit: 2010-12-11 | Discharge: 2010-12-12 | Disposition: A | Discharge status: Home / Self Care | Payer: Medicare Other | Source: Ambulatory Visit | Attending: Orthopedic Surgery | Admitting: Orthopedic Surgery

## 2010-12-11 DIAGNOSIS — Z950 Presence of cardiac pacemaker: Secondary | ICD-10-CM | POA: Insufficient documentation

## 2010-12-11 DIAGNOSIS — I10 Essential (primary) hypertension: Secondary | ICD-10-CM | POA: Insufficient documentation

## 2010-12-11 DIAGNOSIS — Z96659 Presence of unspecified artificial knee joint: Secondary | ICD-10-CM | POA: Insufficient documentation

## 2010-12-11 DIAGNOSIS — I4891 Unspecified atrial fibrillation: Secondary | ICD-10-CM | POA: Insufficient documentation

## 2010-12-11 DIAGNOSIS — M25069 Hemarthrosis, unspecified knee: Secondary | ICD-10-CM | POA: Insufficient documentation

## 2010-12-11 LAB — COMPREHENSIVE METABOLIC PANEL
ALT: 16 U/L (ref 0–35)
CO2: 30 mEq/L (ref 19–32)
Calcium: 8.9 mg/dL (ref 8.4–10.5)
Chloride: 97 mEq/L (ref 96–112)
Creatinine, Ser: 0.67 mg/dL (ref 0.50–1.10)
GFR calc Af Amer: >60 mL/min (ref >60)
GFR calc non Af Amer: >60 mL/min (ref >60)
Glucose, Bld: 89 mg/dL (ref 70–99)
Total Bilirubin: 0.4 mg/dL (ref 0.3–1.2)

## 2010-12-11 LAB — CBC
HCT: 37.7 % (ref 36.0–46.0)
MCH: 26.5 pg (ref 26.0–34.0)
MCV: 83.2 fL (ref 78.0–100.0)
Platelets: 264 10*3/uL (ref 150–400)
RDW: 16.7 % — ABNORMAL HIGH (ref 11.5–15.5)

## 2010-12-11 LAB — URINALYSIS, ROUTINE W REFLEX MICROSCOPIC
Bilirubin Urine: NEGATIVE
Nitrite: NEGATIVE
Protein, ur: NEGATIVE mg/dL
Specific Gravity, Urine: 1.005 (ref 1.005–1.030)
Urobilinogen, UA: 0.2 mg/dL (ref 0.0–1.0)

## 2010-12-11 LAB — URINE MICROSCOPIC-ADD ON

## 2010-12-12 LAB — PROTIME-INR
INR: 2.19 — ABNORMAL HIGH (ref 0.00–1.49)
Prothrombin Time: 24.7 seconds — ABNORMAL HIGH (ref 11.6–15.2)

## 2010-12-15 LAB — BODY FLUID CULTURE

## 2010-12-16 LAB — ANAEROBIC CULTURE

## 2010-12-16 NOTE — Op Note (Signed)
[name], [name]          ACCOUNT NO.:  [id number]  MEDICAL RECORD NO.:  [id number]  LOCATION:  1619                         FACILITY:  [facility]  PHYSICIAN:  Ollen Gross, M.D.    DATE OF BIRTH:  1930/10/24  DATE OF PROCEDURE:  12/11/2010 DATE OF DISCHARGE:                              OPERATIVE REPORT   PREOPERATIVE DIAGNOSIS:  Left knee hematoma.  POSTOPERATIVE DIAGNOSIS:  Left knee hematoma.  PROCEDURE:  Left knee irrigation and debridement.  SURGEON:  Ollen Gross, M.D.  ASSISTANT:  Alexzandrew L. Perkins, P.A.C.  ANESTHESIA:  General.  ESTIMATED BLOOD LOSS:  Minimal.  DRAINS:  Hemovac x1.  TOURNIQUET TIME:  19 minutes at 200 mmHg.  COMPLICATIONS:  None.  CONDITION:  Stable to Recovery.  BRIEF CLINICAL NOTE:  [name] is a 77 year old female, who had a left total knee arthroplasty revision done earlier this year.  She did very well initially and then earlier in summer fell and developed a hematoma proximal to medial tibia.  She had swelling for quite awhile with increased pain. She developed spontaneous drainage about 4 to 5 weeks ago.  We treated with doxycycline.  She has had persistent drainage, but the swelling has gone down and the pain is slowly decreased.  Given the persistent drainage, I fear of intraarticular fluid collection with possible infection.  We have decided I and D her today as she was not responding to nonoperative management.  PROCEDURE IN DETAIL:  After successful administration of general anesthetic, a tourniquet was placed on her left thigh, and left lower extremity was prepped and draped in the usual sterile fashion. Extremity was wrapped in Esmarch, knee flexed, tourniquet inflated to 200 mmHg.  The bottom half of her knee incision was utilized with a 10 blade through the subcutaneous tissue.  I ellipsed out the tiny pinpoint open area.  There was evidence of some fibrinous debris in the subcutaneous tissue along the  proximal medial tibia, but there was no obvious communication with the joint.  I debrided this with a knife and rongeur back to healthy appearing tissue.  I then extended the incision proximally, I created subcu flaps to examine the rest of tissue, which appeared normal.  After I thoroughly irrigated with saline, then I opened the joint and there was a small amount of fluid in the joint, but no evidence of the fibrinous debris in the joint.  I then flexed the knee.  Retraction was held by my assistant on the patella and on the medial retinacular structures to prevent damage to the prosthesis.  It was necessary to have this retraction in order to successfully irrigate the joint; otherwise, we would not have been able to irrigate the full expanses of the joint and have not been able to safely clean it without possibly damaging the prosthesis.  Joint was thoroughly irrigated with 3 L of saline with pulsatile lavage.  Again, I did not see any fibrinous debris in the joint.  All the tissues looked healthy in the joint.  We then closed the joint with interrupted #1 PDS.  Subcu was then closed with interrupted 2-0 Vicryl over Hemovac drain.  Skin was closed with staples.  The drain was freely mobile.  Incision was cleaned and dried. Tourniquet released for a total time of 19 minutes.  Bulky sterile dressing was applied and she was awakened and transferred to recovery in stable condition.  Please note that cultures were sent of the fluid collected from the subcutaneous space.  Also note, that surgical assistant was a medical necessity in order to safely perform this procedure without damaging the prosthesis and to allow for adequate retraction for irrigation of the joint.     Ollen Gross, M.D.     FA/MEDQ  D:  12/11/2010  T:  12/12/2010  Job:  [id number]  Electronically Signed by Ollen Gross M.D. on 12/16/2010 10:33:38 AM

## 2011-03-30 DIAGNOSIS — M25519 Pain in unspecified shoulder: Secondary | ICD-10-CM | POA: Diagnosis not present

## 2011-03-30 DIAGNOSIS — M7520 Bicipital tendinitis, unspecified shoulder: Secondary | ICD-10-CM | POA: Diagnosis not present

## 2011-04-02 DIAGNOSIS — E785 Hyperlipidemia, unspecified: Secondary | ICD-10-CM | POA: Diagnosis not present

## 2011-04-02 DIAGNOSIS — Z7901 Long term (current) use of anticoagulants: Secondary | ICD-10-CM | POA: Diagnosis not present

## 2011-04-02 DIAGNOSIS — I4891 Unspecified atrial fibrillation: Secondary | ICD-10-CM | POA: Diagnosis not present

## 2011-04-02 DIAGNOSIS — I872 Venous insufficiency (chronic) (peripheral): Secondary | ICD-10-CM | POA: Diagnosis not present

## 2011-04-02 DIAGNOSIS — E039 Hypothyroidism, unspecified: Secondary | ICD-10-CM | POA: Diagnosis not present

## 2011-04-02 DIAGNOSIS — Z950 Presence of cardiac pacemaker: Secondary | ICD-10-CM | POA: Diagnosis not present

## 2011-04-30 DIAGNOSIS — I872 Venous insufficiency (chronic) (peripheral): Secondary | ICD-10-CM | POA: Diagnosis not present

## 2011-04-30 DIAGNOSIS — I4891 Unspecified atrial fibrillation: Secondary | ICD-10-CM | POA: Diagnosis not present

## 2011-04-30 DIAGNOSIS — E785 Hyperlipidemia, unspecified: Secondary | ICD-10-CM | POA: Diagnosis not present

## 2011-04-30 DIAGNOSIS — Z950 Presence of cardiac pacemaker: Secondary | ICD-10-CM | POA: Diagnosis not present

## 2011-04-30 DIAGNOSIS — E039 Hypothyroidism, unspecified: Secondary | ICD-10-CM | POA: Diagnosis not present

## 2011-04-30 DIAGNOSIS — Z7901 Long term (current) use of anticoagulants: Secondary | ICD-10-CM | POA: Diagnosis not present

## 2011-05-14 DIAGNOSIS — Z7901 Long term (current) use of anticoagulants: Secondary | ICD-10-CM | POA: Diagnosis not present

## 2011-05-15 ENCOUNTER — Encounter (HOSPITAL_COMMUNITY): Payer: Self-pay | Admitting: *Deleted

## 2011-05-15 ENCOUNTER — Emergency Department (HOSPITAL_COMMUNITY)
Admission: EM | Admit: 2011-05-15 | Discharge: 2011-05-16 | Disposition: A | Discharge status: Home / Self Care | Payer: Medicare Other | Attending: Emergency Medicine | Admitting: Emergency Medicine

## 2011-05-15 DIAGNOSIS — Z7901 Long term (current) use of anticoagulants: Secondary | ICD-10-CM | POA: Insufficient documentation

## 2011-05-15 DIAGNOSIS — X58XXXA Exposure to other specified factors, initial encounter: Secondary | ICD-10-CM | POA: Insufficient documentation

## 2011-05-15 DIAGNOSIS — Z950 Presence of cardiac pacemaker: Secondary | ICD-10-CM | POA: Insufficient documentation

## 2011-05-15 DIAGNOSIS — T7840XA Allergy, unspecified, initial encounter: Secondary | ICD-10-CM

## 2011-05-15 DIAGNOSIS — L509 Urticaria, unspecified: Secondary | ICD-10-CM | POA: Insufficient documentation

## 2011-05-15 DIAGNOSIS — L500 Allergic urticaria: Secondary | ICD-10-CM | POA: Diagnosis not present

## 2011-05-15 NOTE — ED Notes (Signed)
Pt is here for an allergic reaction.  She has itching red rash all over her body.  No facial swelling or sob.  Pt took a benadryl po and a "[facility] allergy pill", she also applied benadryl spray without relief.  Pt is unsure what caused this

## 2011-05-16 DIAGNOSIS — Z7901 Long term (current) use of anticoagulants: Secondary | ICD-10-CM | POA: Diagnosis not present

## 2011-05-16 DIAGNOSIS — Z950 Presence of cardiac pacemaker: Secondary | ICD-10-CM | POA: Diagnosis not present

## 2011-05-16 DIAGNOSIS — T7840XA Allergy, unspecified, initial encounter: Secondary | ICD-10-CM | POA: Diagnosis not present

## 2011-05-16 DIAGNOSIS — L509 Urticaria, unspecified: Secondary | ICD-10-CM | POA: Diagnosis not present

## 2011-05-16 MED ORDER — FAMOTIDINE 20 MG PO TABS: 20.0000 mg | ORAL_TABLET | Freq: Two times a day (BID) | ORAL | Status: DC

## 2011-05-16 MED ORDER — PREDNISONE 20 MG PO TABS
60.0000 mg | ORAL_TABLET | Freq: Once | ORAL | Status: AC
  Administered 2011-05-16: 60 mg via ORAL
  Filled 2011-05-16: qty 3

## 2011-05-16 MED ORDER — FAMOTIDINE 20 MG PO TABS
20.0000 mg | ORAL_TABLET | Freq: Once | ORAL | Status: AC
  Administered 2011-05-16: 20 mg via ORAL
  Filled 2011-05-16: qty 1

## 2011-05-16 MED ORDER — HYDROXYZINE HCL 25 MG PO TABS
25.0000 mg | ORAL_TABLET | Freq: Once | ORAL | Status: AC
  Administered 2011-05-16: 25 mg via ORAL
  Filled 2011-05-16: qty 1

## 2011-05-16 MED ORDER — PREDNISONE 20 MG PO TABS: 40.0000 mg | ORAL_TABLET | Freq: Once | ORAL | Status: AC

## 2011-05-16 MED ORDER — HYDROXYZINE HCL 25 MG PO TABS: 25.0000 mg | ORAL_TABLET | Freq: Four times a day (QID) | ORAL | Status: AC | PRN

## 2011-05-16 NOTE — ED Provider Notes (Signed)
History     CSN: [id number]  Arrival date & time 05/15/11  2249   First MD Initiated Contact with Patient 05/15/11 2341      Chief Complaint  Patient presents with  . Allergic Reaction    (Consider location/radiation/quality/duration/timing/severity/associated sxs/prior treatment) HPI Comments: Patient here with rash and itching all over her body - states that this started on bilateral hands after she took the cat litter out to the garbage - states that she took 2 benadryl at home without  Relief of symptoms - denies shortness of breath, throat swelling, tongue swelling, chest tightness, wheezing.  Patient is a 78 y.o. female presenting with allergic reaction. The history is provided by the patient. No language interpreter was used.  Allergic Reaction The primary symptoms are  rash and urticaria. The primary symptoms do not include wheezing, shortness of breath, cough, abdominal pain, nausea, vomiting, diarrhea, dizziness, palpitations, altered mental status or angioedema. The current episode started 3 to 5 hours ago. The problem has not changed since onset.This is a new problem.  The rash is associated with itching.  Significant symptoms also include itching. Significant symptoms that are not present include eye redness, flushing or rhinorrhea.    No past medical history on file.  Past Surgical History  Procedure Date  . Pacemaker insertion   . Knee surgery     No family history on file.  History  Substance Use Topics  . Smoking status: Not on file  . Smokeless tobacco: Not on file  . Alcohol Use: No    OB History    Grav Para Term Preterm Abortions TAB SAB Ect Mult Living                  Review of Systems  Constitutional: Negative for fever and chills.  HENT: Negative for facial swelling, rhinorrhea, drooling, trouble swallowing and neck stiffness.   Eyes: Negative for redness and itching.  Respiratory: Negative for cough, shortness of breath and wheezing.     Cardiovascular: Negative for palpitations.  Gastrointestinal: Negative for nausea, vomiting, abdominal pain and diarrhea.  Genitourinary: Negative for difficulty urinating.  Musculoskeletal: Negative for back pain and joint swelling.  Skin: Positive for itching and rash. Negative for flushing.  Neurological: Negative for dizziness and headaches.  Psychiatric/Behavioral: Negative for altered mental status.  All other systems reviewed and are negative.    Allergies  Penicillins  Home Medications   Current Outpatient Rx  Name Route Sig Dispense Refill  . CELECOXIB 200 MG PO CAPS Oral Take 200 mg by mouth 2 (two) times daily.    Marland Kitchen DIGOXIN 0.25 MG PO TABS Oral Take 250 mcg by mouth daily.    Marland Kitchen DILTIAZEM HCL ER COATED BEADS 120 MG PO CP24 Oral Take 120 mg by mouth daily.    Marland Kitchen DIPHENHYDRAMINE HCL 25 MG PO TABS Oral Take 50 mg by mouth every 6 (six) hours as needed. Allergic reaction    . LEVOTHYROXINE SODIUM 125 MCG PO TABS Oral Take 125 mcg by mouth daily.    . OXYBUTYNIN CHLORIDE 5 MG PO TABS Oral Take 2.5 mg by mouth daily.    . SERTRALINE HCL 50 MG PO TABS Oral Take 25 mg by mouth daily.    . TRAMADOL HCL 50 MG PO TABS Oral Take 50 mg by mouth every 6 (six) hours as needed. For pain    . WARFARIN SODIUM 4 MG PO TABS Oral Take 4 mg by mouth daily.    Marland Kitchen FAMOTIDINE 20  MG PO TABS Oral Take 1 tablet (20 mg total) by mouth 2 (two) times daily. 30 tablet 0  . HYDROXYZINE HCL 25 MG PO TABS Oral Take 1 tablet (25 mg total) by mouth every 6 (six) hours as needed for itching. 30 tablet 0  . PREDNISONE 20 MG PO TABS Oral Take 2 tablets (40 mg total) by mouth once. 10 tablet 0    BP 147/76  Pulse 62  Temp(Src) 97 F (36.1 C) (Oral)  Resp 20  SpO2 93%  Physical Exam  Nursing note and vitals reviewed. Constitutional: She is oriented to person, place, and time. She appears well-developed and well-nourished. No distress.  HENT:  Head: Normocephalic and atraumatic.  Right Ear: External ear  normal.  Left Ear: External ear normal.  Nose: Nose normal.  Mouth/Throat: Oropharynx is clear and moist. No oropharyngeal exudate.  Eyes: Conjunctivae are normal. Pupils are equal, round, and reactive to light. No scleral icterus.  Neck: Normal range of motion.  Cardiovascular: Normal rate, regular rhythm and normal heart sounds.  Exam reveals no gallop and no friction rub.   No murmur heard. Pulmonary/Chest: Effort normal and breath sounds normal. No respiratory distress. She has no wheezes. She exhibits no tenderness.  Abdominal: Soft. Bowel sounds are normal. She exhibits no distension.  Musculoskeletal: Normal range of motion. She exhibits no edema and no tenderness.  Lymphadenopathy:    She has no cervical adenopathy.  Neurological: She is alert and oriented to person, place, and time. No cranial nerve deficit.  Skin: Skin is warm and dry. Rash noted. Rash is urticarial.       Patient with diffuse urticarial rash noted to most of body.  Psychiatric: She has a normal mood and affect. Her behavior is normal. Judgment and thought content normal.    ED Course  Procedures (including critical care time)  Labs Reviewed - No data to display No results found.   1. Allergic reaction       MDM  Mild improvement in symptoms after the vistaril, pepcid and prednisone - watched for 1 hour - still without sensation of tongue swelling, chest tightness - mild rash remains - will discharge home with instructions to return if worsening.        Izola Price [place], [place] 05/16/11 [phone]

## 2011-05-16 NOTE — ED Provider Notes (Signed)
Medical screening examination/treatment/procedure(s) were performed by non-physician practitioner and as supervising physician I was immediately available for consultation/collaboration.   Vida Roller, MD 05/16/11 [phone]

## 2011-05-16 NOTE — Discharge Instructions (Signed)

## 2011-05-25 DIAGNOSIS — T84039A Mechanical loosening of unspecified internal prosthetic joint, initial encounter: Secondary | ICD-10-CM | POA: Diagnosis not present

## 2011-05-27 DIAGNOSIS — L509 Urticaria, unspecified: Secondary | ICD-10-CM | POA: Diagnosis not present

## 2011-05-27 DIAGNOSIS — H02109 Unspecified ectropion of unspecified eye, unspecified eyelid: Secondary | ICD-10-CM | POA: Diagnosis not present

## 2011-05-27 DIAGNOSIS — L500 Allergic urticaria: Secondary | ICD-10-CM | POA: Diagnosis not present

## 2011-05-28 DIAGNOSIS — I872 Venous insufficiency (chronic) (peripheral): Secondary | ICD-10-CM | POA: Diagnosis not present

## 2011-05-28 DIAGNOSIS — E039 Hypothyroidism, unspecified: Secondary | ICD-10-CM | POA: Diagnosis not present

## 2011-05-28 DIAGNOSIS — Z950 Presence of cardiac pacemaker: Secondary | ICD-10-CM | POA: Diagnosis not present

## 2011-05-28 DIAGNOSIS — E785 Hyperlipidemia, unspecified: Secondary | ICD-10-CM | POA: Diagnosis not present

## 2011-05-28 DIAGNOSIS — Z7901 Long term (current) use of anticoagulants: Secondary | ICD-10-CM | POA: Diagnosis not present

## 2011-05-28 DIAGNOSIS — I4891 Unspecified atrial fibrillation: Secondary | ICD-10-CM | POA: Diagnosis not present

## 2011-06-04 DIAGNOSIS — J3089 Other allergic rhinitis: Secondary | ICD-10-CM | POA: Diagnosis not present

## 2011-06-04 DIAGNOSIS — L509 Urticaria, unspecified: Secondary | ICD-10-CM | POA: Diagnosis not present

## 2011-06-04 DIAGNOSIS — E785 Hyperlipidemia, unspecified: Secondary | ICD-10-CM | POA: Diagnosis not present

## 2011-06-04 DIAGNOSIS — H1045 Other chronic allergic conjunctivitis: Secondary | ICD-10-CM | POA: Diagnosis not present

## 2011-06-04 DIAGNOSIS — E039 Hypothyroidism, unspecified: Secondary | ICD-10-CM | POA: Diagnosis not present

## 2011-06-04 DIAGNOSIS — Z7901 Long term (current) use of anticoagulants: Secondary | ICD-10-CM | POA: Diagnosis not present

## 2011-06-04 DIAGNOSIS — I4891 Unspecified atrial fibrillation: Secondary | ICD-10-CM | POA: Diagnosis not present

## 2011-06-04 DIAGNOSIS — I872 Venous insufficiency (chronic) (peripheral): Secondary | ICD-10-CM | POA: Diagnosis not present

## 2011-06-04 DIAGNOSIS — Z950 Presence of cardiac pacemaker: Secondary | ICD-10-CM | POA: Diagnosis not present

## 2011-06-10 DIAGNOSIS — H52219 Irregular astigmatism, unspecified eye: Secondary | ICD-10-CM | POA: Diagnosis not present

## 2011-06-10 DIAGNOSIS — H5310 Unspecified subjective visual disturbances: Secondary | ICD-10-CM | POA: Diagnosis not present

## 2011-06-10 DIAGNOSIS — Z961 Presence of intraocular lens: Secondary | ICD-10-CM | POA: Diagnosis not present

## 2011-06-10 DIAGNOSIS — H02409 Unspecified ptosis of unspecified eyelid: Secondary | ICD-10-CM | POA: Diagnosis not present

## 2011-06-11 DIAGNOSIS — Z950 Presence of cardiac pacemaker: Secondary | ICD-10-CM | POA: Diagnosis not present

## 2011-06-11 DIAGNOSIS — I872 Venous insufficiency (chronic) (peripheral): Secondary | ICD-10-CM | POA: Diagnosis not present

## 2011-06-11 DIAGNOSIS — E039 Hypothyroidism, unspecified: Secondary | ICD-10-CM | POA: Diagnosis not present

## 2011-06-11 DIAGNOSIS — E785 Hyperlipidemia, unspecified: Secondary | ICD-10-CM | POA: Diagnosis not present

## 2011-06-11 DIAGNOSIS — I4891 Unspecified atrial fibrillation: Secondary | ICD-10-CM | POA: Diagnosis not present

## 2011-06-11 DIAGNOSIS — Z7901 Long term (current) use of anticoagulants: Secondary | ICD-10-CM | POA: Diagnosis not present

## 2011-06-16 DIAGNOSIS — I872 Venous insufficiency (chronic) (peripheral): Secondary | ICD-10-CM | POA: Diagnosis not present

## 2011-06-16 DIAGNOSIS — E785 Hyperlipidemia, unspecified: Secondary | ICD-10-CM | POA: Diagnosis not present

## 2011-06-16 DIAGNOSIS — I4891 Unspecified atrial fibrillation: Secondary | ICD-10-CM | POA: Diagnosis not present

## 2011-06-16 DIAGNOSIS — Z950 Presence of cardiac pacemaker: Secondary | ICD-10-CM | POA: Diagnosis not present

## 2011-06-16 DIAGNOSIS — Z7901 Long term (current) use of anticoagulants: Secondary | ICD-10-CM | POA: Diagnosis not present

## 2011-06-16 DIAGNOSIS — E039 Hypothyroidism, unspecified: Secondary | ICD-10-CM | POA: Diagnosis not present

## 2011-07-02 DIAGNOSIS — E785 Hyperlipidemia, unspecified: Secondary | ICD-10-CM | POA: Diagnosis not present

## 2011-07-02 DIAGNOSIS — E039 Hypothyroidism, unspecified: Secondary | ICD-10-CM | POA: Diagnosis not present

## 2011-07-02 DIAGNOSIS — Z950 Presence of cardiac pacemaker: Secondary | ICD-10-CM | POA: Diagnosis not present

## 2011-07-02 DIAGNOSIS — I872 Venous insufficiency (chronic) (peripheral): Secondary | ICD-10-CM | POA: Diagnosis not present

## 2011-07-02 DIAGNOSIS — Z7901 Long term (current) use of anticoagulants: Secondary | ICD-10-CM | POA: Diagnosis not present

## 2011-07-02 DIAGNOSIS — I4891 Unspecified atrial fibrillation: Secondary | ICD-10-CM | POA: Diagnosis not present

## 2011-07-23 DIAGNOSIS — J3089 Other allergic rhinitis: Secondary | ICD-10-CM | POA: Diagnosis not present

## 2011-07-23 DIAGNOSIS — L509 Urticaria, unspecified: Secondary | ICD-10-CM | POA: Diagnosis not present

## 2011-07-23 DIAGNOSIS — H1045 Other chronic allergic conjunctivitis: Secondary | ICD-10-CM | POA: Diagnosis not present

## 2011-07-29 ENCOUNTER — Encounter: Payer: Self-pay | Admitting: *Deleted

## 2011-08-02 DIAGNOSIS — Z950 Presence of cardiac pacemaker: Secondary | ICD-10-CM | POA: Diagnosis not present

## 2011-08-02 DIAGNOSIS — E039 Hypothyroidism, unspecified: Secondary | ICD-10-CM | POA: Diagnosis not present

## 2011-08-02 DIAGNOSIS — Z7901 Long term (current) use of anticoagulants: Secondary | ICD-10-CM | POA: Diagnosis not present

## 2011-08-02 DIAGNOSIS — I872 Venous insufficiency (chronic) (peripheral): Secondary | ICD-10-CM | POA: Diagnosis not present

## 2011-08-02 DIAGNOSIS — I4891 Unspecified atrial fibrillation: Secondary | ICD-10-CM | POA: Diagnosis not present

## 2011-08-02 DIAGNOSIS — E785 Hyperlipidemia, unspecified: Secondary | ICD-10-CM | POA: Diagnosis not present

## 2011-08-17 DIAGNOSIS — I872 Venous insufficiency (chronic) (peripheral): Secondary | ICD-10-CM | POA: Diagnosis not present

## 2011-08-17 DIAGNOSIS — I4891 Unspecified atrial fibrillation: Secondary | ICD-10-CM | POA: Diagnosis not present

## 2011-08-17 DIAGNOSIS — E039 Hypothyroidism, unspecified: Secondary | ICD-10-CM | POA: Diagnosis not present

## 2011-08-17 DIAGNOSIS — E785 Hyperlipidemia, unspecified: Secondary | ICD-10-CM | POA: Diagnosis not present

## 2011-08-17 DIAGNOSIS — Z950 Presence of cardiac pacemaker: Secondary | ICD-10-CM | POA: Diagnosis not present

## 2011-08-17 DIAGNOSIS — Z7901 Long term (current) use of anticoagulants: Secondary | ICD-10-CM | POA: Diagnosis not present

## 2011-09-03 DIAGNOSIS — Z7901 Long term (current) use of anticoagulants: Secondary | ICD-10-CM | POA: Diagnosis not present

## 2011-09-03 DIAGNOSIS — Z950 Presence of cardiac pacemaker: Secondary | ICD-10-CM | POA: Diagnosis not present

## 2011-09-03 DIAGNOSIS — I4891 Unspecified atrial fibrillation: Secondary | ICD-10-CM | POA: Diagnosis not present

## 2011-09-03 DIAGNOSIS — E785 Hyperlipidemia, unspecified: Secondary | ICD-10-CM | POA: Diagnosis not present

## 2011-09-03 DIAGNOSIS — I872 Venous insufficiency (chronic) (peripheral): Secondary | ICD-10-CM | POA: Diagnosis not present

## 2011-09-03 DIAGNOSIS — E039 Hypothyroidism, unspecified: Secondary | ICD-10-CM | POA: Diagnosis not present

## 2011-09-16 DIAGNOSIS — E039 Hypothyroidism, unspecified: Secondary | ICD-10-CM | POA: Diagnosis not present

## 2011-09-16 DIAGNOSIS — E785 Hyperlipidemia, unspecified: Secondary | ICD-10-CM | POA: Diagnosis not present

## 2011-09-16 DIAGNOSIS — Z950 Presence of cardiac pacemaker: Secondary | ICD-10-CM | POA: Diagnosis not present

## 2011-09-16 DIAGNOSIS — I4891 Unspecified atrial fibrillation: Secondary | ICD-10-CM | POA: Diagnosis not present

## 2011-09-16 DIAGNOSIS — Z7901 Long term (current) use of anticoagulants: Secondary | ICD-10-CM | POA: Diagnosis not present

## 2011-09-16 DIAGNOSIS — I872 Venous insufficiency (chronic) (peripheral): Secondary | ICD-10-CM | POA: Diagnosis not present

## 2011-09-17 DIAGNOSIS — I4891 Unspecified atrial fibrillation: Secondary | ICD-10-CM | POA: Diagnosis not present

## 2011-09-17 DIAGNOSIS — Z950 Presence of cardiac pacemaker: Secondary | ICD-10-CM | POA: Diagnosis not present

## 2011-09-17 DIAGNOSIS — E039 Hypothyroidism, unspecified: Secondary | ICD-10-CM | POA: Diagnosis not present

## 2011-09-17 DIAGNOSIS — E785 Hyperlipidemia, unspecified: Secondary | ICD-10-CM | POA: Diagnosis not present

## 2011-09-17 DIAGNOSIS — I872 Venous insufficiency (chronic) (peripheral): Secondary | ICD-10-CM | POA: Diagnosis not present

## 2011-09-17 DIAGNOSIS — Z7901 Long term (current) use of anticoagulants: Secondary | ICD-10-CM | POA: Diagnosis not present

## 2011-10-22 DIAGNOSIS — M5412 Radiculopathy, cervical region: Secondary | ICD-10-CM | POA: Diagnosis not present

## 2011-10-22 DIAGNOSIS — R5383 Other fatigue: Secondary | ICD-10-CM | POA: Diagnosis not present

## 2011-10-22 DIAGNOSIS — E039 Hypothyroidism, unspecified: Secondary | ICD-10-CM | POA: Diagnosis not present

## 2011-10-22 DIAGNOSIS — G589 Mononeuropathy, unspecified: Secondary | ICD-10-CM | POA: Diagnosis not present

## 2011-10-29 DIAGNOSIS — Z7901 Long term (current) use of anticoagulants: Secondary | ICD-10-CM | POA: Diagnosis not present

## 2011-10-29 DIAGNOSIS — Z950 Presence of cardiac pacemaker: Secondary | ICD-10-CM | POA: Diagnosis not present

## 2011-10-29 DIAGNOSIS — E039 Hypothyroidism, unspecified: Secondary | ICD-10-CM | POA: Diagnosis not present

## 2011-10-29 DIAGNOSIS — I4891 Unspecified atrial fibrillation: Secondary | ICD-10-CM | POA: Diagnosis not present

## 2011-10-29 DIAGNOSIS — I872 Venous insufficiency (chronic) (peripheral): Secondary | ICD-10-CM | POA: Diagnosis not present

## 2011-10-29 DIAGNOSIS — E785 Hyperlipidemia, unspecified: Secondary | ICD-10-CM | POA: Diagnosis not present

## 2011-11-04 ENCOUNTER — Ambulatory Visit: Payer: Medicare Other | Attending: Internal Medicine | Admitting: Physical Therapy

## 2011-11-04 DIAGNOSIS — M25519 Pain in unspecified shoulder: Secondary | ICD-10-CM | POA: Insufficient documentation

## 2011-11-04 DIAGNOSIS — M542 Cervicalgia: Secondary | ICD-10-CM | POA: Insufficient documentation

## 2011-11-04 DIAGNOSIS — IMO0001 Reserved for inherently not codable concepts without codable children: Secondary | ICD-10-CM | POA: Insufficient documentation

## 2011-11-05 ENCOUNTER — Other Ambulatory Visit: Payer: Self-pay | Admitting: Cardiology

## 2011-11-09 ENCOUNTER — Ambulatory Visit: Payer: Medicare Other | Admitting: Rehabilitative and Restorative Service Providers"

## 2011-11-09 DIAGNOSIS — IMO0001 Reserved for inherently not codable concepts without codable children: Secondary | ICD-10-CM | POA: Diagnosis not present

## 2011-11-09 DIAGNOSIS — M25519 Pain in unspecified shoulder: Secondary | ICD-10-CM | POA: Diagnosis not present

## 2011-11-09 DIAGNOSIS — M542 Cervicalgia: Secondary | ICD-10-CM | POA: Diagnosis not present

## 2011-11-11 DIAGNOSIS — J3089 Other allergic rhinitis: Secondary | ICD-10-CM | POA: Diagnosis not present

## 2011-11-11 DIAGNOSIS — H1045 Other chronic allergic conjunctivitis: Secondary | ICD-10-CM | POA: Diagnosis not present

## 2011-11-11 DIAGNOSIS — L509 Urticaria, unspecified: Secondary | ICD-10-CM | POA: Diagnosis not present

## 2011-11-16 ENCOUNTER — Ambulatory Visit: Payer: Medicare Other | Admitting: Physical Therapy

## 2011-11-16 DIAGNOSIS — M25519 Pain in unspecified shoulder: Secondary | ICD-10-CM | POA: Diagnosis not present

## 2011-11-16 DIAGNOSIS — IMO0001 Reserved for inherently not codable concepts without codable children: Secondary | ICD-10-CM | POA: Diagnosis not present

## 2011-11-16 DIAGNOSIS — M542 Cervicalgia: Secondary | ICD-10-CM | POA: Diagnosis not present

## 2011-11-18 ENCOUNTER — Ambulatory Visit: Payer: Medicare Other | Admitting: Physical Therapy

## 2011-11-18 DIAGNOSIS — IMO0001 Reserved for inherently not codable concepts without codable children: Secondary | ICD-10-CM | POA: Diagnosis not present

## 2011-11-18 DIAGNOSIS — M542 Cervicalgia: Secondary | ICD-10-CM | POA: Diagnosis not present

## 2011-11-18 DIAGNOSIS — M25519 Pain in unspecified shoulder: Secondary | ICD-10-CM | POA: Diagnosis not present

## 2011-11-19 ENCOUNTER — Encounter: Payer: Medicare Other | Admitting: Rehabilitation

## 2011-11-23 ENCOUNTER — Ambulatory Visit: Payer: Medicare Other | Attending: Internal Medicine | Admitting: Rehabilitation

## 2011-11-23 DIAGNOSIS — M25519 Pain in unspecified shoulder: Secondary | ICD-10-CM | POA: Insufficient documentation

## 2011-11-23 DIAGNOSIS — IMO0001 Reserved for inherently not codable concepts without codable children: Secondary | ICD-10-CM | POA: Diagnosis not present

## 2011-11-23 DIAGNOSIS — M542 Cervicalgia: Secondary | ICD-10-CM | POA: Diagnosis not present

## 2011-11-25 ENCOUNTER — Ambulatory Visit: Payer: Medicare Other | Admitting: Rehabilitation

## 2011-12-02 ENCOUNTER — Encounter: Payer: Medicare Other | Admitting: Rehabilitative and Restorative Service Providers"

## 2011-12-03 DIAGNOSIS — I872 Venous insufficiency (chronic) (peripheral): Secondary | ICD-10-CM | POA: Diagnosis not present

## 2011-12-03 DIAGNOSIS — I4891 Unspecified atrial fibrillation: Secondary | ICD-10-CM | POA: Diagnosis not present

## 2011-12-03 DIAGNOSIS — E785 Hyperlipidemia, unspecified: Secondary | ICD-10-CM | POA: Diagnosis not present

## 2011-12-03 DIAGNOSIS — E039 Hypothyroidism, unspecified: Secondary | ICD-10-CM | POA: Diagnosis not present

## 2011-12-03 DIAGNOSIS — Z950 Presence of cardiac pacemaker: Secondary | ICD-10-CM | POA: Diagnosis not present

## 2011-12-03 DIAGNOSIS — Z7901 Long term (current) use of anticoagulants: Secondary | ICD-10-CM | POA: Diagnosis not present

## 2011-12-06 DIAGNOSIS — Z23 Encounter for immunization: Secondary | ICD-10-CM | POA: Diagnosis not present

## 2011-12-07 ENCOUNTER — Ambulatory Visit: Payer: Medicare Other | Admitting: Physical Therapy

## 2011-12-09 ENCOUNTER — Ambulatory Visit: Payer: Medicare Other | Admitting: Physical Therapy

## 2011-12-14 ENCOUNTER — Ambulatory Visit: Payer: Medicare Other | Admitting: Rehabilitation

## 2011-12-16 ENCOUNTER — Ambulatory Visit: Payer: Medicare Other | Admitting: Physical Therapy

## 2011-12-17 DIAGNOSIS — I4891 Unspecified atrial fibrillation: Secondary | ICD-10-CM | POA: Diagnosis not present

## 2011-12-17 DIAGNOSIS — E785 Hyperlipidemia, unspecified: Secondary | ICD-10-CM | POA: Diagnosis not present

## 2011-12-17 DIAGNOSIS — Z7901 Long term (current) use of anticoagulants: Secondary | ICD-10-CM | POA: Diagnosis not present

## 2011-12-17 DIAGNOSIS — Z950 Presence of cardiac pacemaker: Secondary | ICD-10-CM | POA: Diagnosis not present

## 2011-12-17 DIAGNOSIS — I872 Venous insufficiency (chronic) (peripheral): Secondary | ICD-10-CM | POA: Diagnosis not present

## 2011-12-17 DIAGNOSIS — E039 Hypothyroidism, unspecified: Secondary | ICD-10-CM | POA: Diagnosis not present

## 2011-12-28 DIAGNOSIS — Z950 Presence of cardiac pacemaker: Secondary | ICD-10-CM | POA: Diagnosis not present

## 2011-12-28 DIAGNOSIS — Z7901 Long term (current) use of anticoagulants: Secondary | ICD-10-CM | POA: Diagnosis not present

## 2011-12-28 DIAGNOSIS — E785 Hyperlipidemia, unspecified: Secondary | ICD-10-CM | POA: Diagnosis not present

## 2011-12-28 DIAGNOSIS — I4891 Unspecified atrial fibrillation: Secondary | ICD-10-CM | POA: Diagnosis not present

## 2011-12-28 DIAGNOSIS — E039 Hypothyroidism, unspecified: Secondary | ICD-10-CM | POA: Diagnosis not present

## 2011-12-28 DIAGNOSIS — I872 Venous insufficiency (chronic) (peripheral): Secondary | ICD-10-CM | POA: Diagnosis not present

## 2012-01-11 ENCOUNTER — Ambulatory Visit: Payer: Medicare Other | Attending: Internal Medicine | Admitting: Physical Therapy

## 2012-01-11 DIAGNOSIS — Z950 Presence of cardiac pacemaker: Secondary | ICD-10-CM | POA: Insufficient documentation

## 2012-01-11 DIAGNOSIS — Z96659 Presence of unspecified artificial knee joint: Secondary | ICD-10-CM | POA: Insufficient documentation

## 2012-01-11 DIAGNOSIS — IMO0001 Reserved for inherently not codable concepts without codable children: Secondary | ICD-10-CM | POA: Insufficient documentation

## 2012-01-11 DIAGNOSIS — M25519 Pain in unspecified shoulder: Secondary | ICD-10-CM | POA: Insufficient documentation

## 2012-01-11 DIAGNOSIS — M542 Cervicalgia: Secondary | ICD-10-CM | POA: Insufficient documentation

## 2012-01-11 DIAGNOSIS — M2569 Stiffness of other specified joint, not elsewhere classified: Secondary | ICD-10-CM | POA: Insufficient documentation

## 2012-01-14 DIAGNOSIS — Z950 Presence of cardiac pacemaker: Secondary | ICD-10-CM | POA: Diagnosis not present

## 2012-01-14 DIAGNOSIS — E039 Hypothyroidism, unspecified: Secondary | ICD-10-CM | POA: Diagnosis not present

## 2012-01-14 DIAGNOSIS — I872 Venous insufficiency (chronic) (peripheral): Secondary | ICD-10-CM | POA: Diagnosis not present

## 2012-01-14 DIAGNOSIS — Z7901 Long term (current) use of anticoagulants: Secondary | ICD-10-CM | POA: Diagnosis not present

## 2012-01-14 DIAGNOSIS — E785 Hyperlipidemia, unspecified: Secondary | ICD-10-CM | POA: Diagnosis not present

## 2012-01-14 DIAGNOSIS — I4891 Unspecified atrial fibrillation: Secondary | ICD-10-CM | POA: Diagnosis not present

## 2012-01-21 DIAGNOSIS — M1710 Unilateral primary osteoarthritis, unspecified knee: Secondary | ICD-10-CM | POA: Diagnosis not present

## 2012-01-21 DIAGNOSIS — F329 Major depressive disorder, single episode, unspecified: Secondary | ICD-10-CM | POA: Diagnosis not present

## 2012-01-21 DIAGNOSIS — R5381 Other malaise: Secondary | ICD-10-CM | POA: Diagnosis not present

## 2012-01-21 DIAGNOSIS — E785 Hyperlipidemia, unspecified: Secondary | ICD-10-CM | POA: Diagnosis not present

## 2012-02-11 DIAGNOSIS — E785 Hyperlipidemia, unspecified: Secondary | ICD-10-CM | POA: Diagnosis not present

## 2012-02-11 DIAGNOSIS — I872 Venous insufficiency (chronic) (peripheral): Secondary | ICD-10-CM | POA: Diagnosis not present

## 2012-02-11 DIAGNOSIS — I4891 Unspecified atrial fibrillation: Secondary | ICD-10-CM | POA: Diagnosis not present

## 2012-02-11 DIAGNOSIS — Z950 Presence of cardiac pacemaker: Secondary | ICD-10-CM | POA: Diagnosis not present

## 2012-02-11 DIAGNOSIS — E039 Hypothyroidism, unspecified: Secondary | ICD-10-CM | POA: Diagnosis not present

## 2012-02-11 DIAGNOSIS — Z7901 Long term (current) use of anticoagulants: Secondary | ICD-10-CM | POA: Diagnosis not present

## 2012-02-25 DIAGNOSIS — Z7901 Long term (current) use of anticoagulants: Secondary | ICD-10-CM | POA: Diagnosis not present

## 2012-02-25 DIAGNOSIS — I4891 Unspecified atrial fibrillation: Secondary | ICD-10-CM | POA: Diagnosis not present

## 2012-02-25 DIAGNOSIS — Z950 Presence of cardiac pacemaker: Secondary | ICD-10-CM | POA: Diagnosis not present

## 2012-02-25 DIAGNOSIS — E785 Hyperlipidemia, unspecified: Secondary | ICD-10-CM | POA: Diagnosis not present

## 2012-02-25 DIAGNOSIS — E039 Hypothyroidism, unspecified: Secondary | ICD-10-CM | POA: Diagnosis not present

## 2012-02-25 DIAGNOSIS — I872 Venous insufficiency (chronic) (peripheral): Secondary | ICD-10-CM | POA: Diagnosis not present

## 2012-03-30 DIAGNOSIS — Z950 Presence of cardiac pacemaker: Secondary | ICD-10-CM | POA: Diagnosis not present

## 2012-03-30 DIAGNOSIS — I4891 Unspecified atrial fibrillation: Secondary | ICD-10-CM | POA: Diagnosis not present

## 2012-05-01 DIAGNOSIS — E039 Hypothyroidism, unspecified: Secondary | ICD-10-CM | POA: Diagnosis not present

## 2012-05-01 DIAGNOSIS — Z7901 Long term (current) use of anticoagulants: Secondary | ICD-10-CM | POA: Diagnosis not present

## 2012-05-01 DIAGNOSIS — Z950 Presence of cardiac pacemaker: Secondary | ICD-10-CM | POA: Diagnosis not present

## 2012-05-01 DIAGNOSIS — I4891 Unspecified atrial fibrillation: Secondary | ICD-10-CM | POA: Diagnosis not present

## 2012-05-01 DIAGNOSIS — E785 Hyperlipidemia, unspecified: Secondary | ICD-10-CM | POA: Diagnosis not present

## 2012-05-01 DIAGNOSIS — I872 Venous insufficiency (chronic) (peripheral): Secondary | ICD-10-CM | POA: Diagnosis not present

## 2012-05-25 DIAGNOSIS — R82998 Other abnormal findings in urine: Secondary | ICD-10-CM | POA: Diagnosis not present

## 2012-05-25 DIAGNOSIS — E785 Hyperlipidemia, unspecified: Secondary | ICD-10-CM | POA: Diagnosis not present

## 2012-05-25 DIAGNOSIS — E039 Hypothyroidism, unspecified: Secondary | ICD-10-CM | POA: Diagnosis not present

## 2012-05-25 DIAGNOSIS — I4891 Unspecified atrial fibrillation: Secondary | ICD-10-CM | POA: Diagnosis not present

## 2012-05-25 DIAGNOSIS — Z79899 Other long term (current) drug therapy: Secondary | ICD-10-CM | POA: Diagnosis not present

## 2012-06-01 DIAGNOSIS — Z0000 Encounter for general adult medical examination without abnormal findings: Secondary | ICD-10-CM | POA: Diagnosis not present

## 2012-06-01 DIAGNOSIS — Z1331 Encounter for screening for depression: Secondary | ICD-10-CM | POA: Diagnosis not present

## 2012-06-01 DIAGNOSIS — E785 Hyperlipidemia, unspecified: Secondary | ICD-10-CM | POA: Diagnosis not present

## 2012-06-01 DIAGNOSIS — M1710 Unilateral primary osteoarthritis, unspecified knee: Secondary | ICD-10-CM | POA: Diagnosis not present

## 2012-06-01 DIAGNOSIS — E039 Hypothyroidism, unspecified: Secondary | ICD-10-CM | POA: Diagnosis not present

## 2012-06-01 DIAGNOSIS — I4891 Unspecified atrial fibrillation: Secondary | ICD-10-CM | POA: Diagnosis not present

## 2012-06-01 DIAGNOSIS — F329 Major depressive disorder, single episode, unspecified: Secondary | ICD-10-CM | POA: Diagnosis not present

## 2012-06-01 DIAGNOSIS — Z79899 Other long term (current) drug therapy: Secondary | ICD-10-CM | POA: Diagnosis not present

## 2012-06-05 DIAGNOSIS — Z1212 Encounter for screening for malignant neoplasm of rectum: Secondary | ICD-10-CM | POA: Diagnosis not present

## 2012-06-09 DIAGNOSIS — J3089 Other allergic rhinitis: Secondary | ICD-10-CM | POA: Diagnosis not present

## 2012-06-09 DIAGNOSIS — R05 Cough: Secondary | ICD-10-CM | POA: Diagnosis not present

## 2012-06-09 DIAGNOSIS — H1045 Other chronic allergic conjunctivitis: Secondary | ICD-10-CM | POA: Diagnosis not present

## 2012-06-09 DIAGNOSIS — L509 Urticaria, unspecified: Secondary | ICD-10-CM | POA: Diagnosis not present

## 2012-06-12 DIAGNOSIS — E039 Hypothyroidism, unspecified: Secondary | ICD-10-CM | POA: Diagnosis not present

## 2012-06-12 DIAGNOSIS — Z7901 Long term (current) use of anticoagulants: Secondary | ICD-10-CM | POA: Diagnosis not present

## 2012-06-12 DIAGNOSIS — Z950 Presence of cardiac pacemaker: Secondary | ICD-10-CM | POA: Diagnosis not present

## 2012-06-12 DIAGNOSIS — I4891 Unspecified atrial fibrillation: Secondary | ICD-10-CM | POA: Diagnosis not present

## 2012-06-12 DIAGNOSIS — E785 Hyperlipidemia, unspecified: Secondary | ICD-10-CM | POA: Diagnosis not present

## 2012-06-12 DIAGNOSIS — I872 Venous insufficiency (chronic) (peripheral): Secondary | ICD-10-CM | POA: Diagnosis not present

## 2012-06-23 DIAGNOSIS — E039 Hypothyroidism, unspecified: Secondary | ICD-10-CM | POA: Diagnosis not present

## 2012-06-23 DIAGNOSIS — I872 Venous insufficiency (chronic) (peripheral): Secondary | ICD-10-CM | POA: Diagnosis not present

## 2012-06-23 DIAGNOSIS — E785 Hyperlipidemia, unspecified: Secondary | ICD-10-CM | POA: Diagnosis not present

## 2012-06-23 DIAGNOSIS — Z7901 Long term (current) use of anticoagulants: Secondary | ICD-10-CM | POA: Diagnosis not present

## 2012-06-23 DIAGNOSIS — I4891 Unspecified atrial fibrillation: Secondary | ICD-10-CM | POA: Diagnosis not present

## 2012-06-23 DIAGNOSIS — Z950 Presence of cardiac pacemaker: Secondary | ICD-10-CM | POA: Diagnosis not present

## 2012-06-27 DIAGNOSIS — Z7901 Long term (current) use of anticoagulants: Secondary | ICD-10-CM | POA: Diagnosis not present

## 2012-06-27 DIAGNOSIS — I4891 Unspecified atrial fibrillation: Secondary | ICD-10-CM | POA: Diagnosis not present

## 2012-06-27 DIAGNOSIS — R011 Cardiac murmur, unspecified: Secondary | ICD-10-CM | POA: Diagnosis not present

## 2012-06-27 DIAGNOSIS — E039 Hypothyroidism, unspecified: Secondary | ICD-10-CM | POA: Diagnosis not present

## 2012-06-27 DIAGNOSIS — Z950 Presence of cardiac pacemaker: Secondary | ICD-10-CM | POA: Diagnosis not present

## 2012-06-27 DIAGNOSIS — E785 Hyperlipidemia, unspecified: Secondary | ICD-10-CM | POA: Diagnosis not present

## 2012-06-27 DIAGNOSIS — I872 Venous insufficiency (chronic) (peripheral): Secondary | ICD-10-CM | POA: Diagnosis not present

## 2012-06-28 DIAGNOSIS — R011 Cardiac murmur, unspecified: Secondary | ICD-10-CM | POA: Diagnosis not present

## 2012-07-06 DIAGNOSIS — I872 Venous insufficiency (chronic) (peripheral): Secondary | ICD-10-CM | POA: Diagnosis not present

## 2012-07-06 DIAGNOSIS — E039 Hypothyroidism, unspecified: Secondary | ICD-10-CM | POA: Diagnosis not present

## 2012-07-06 DIAGNOSIS — Z950 Presence of cardiac pacemaker: Secondary | ICD-10-CM | POA: Diagnosis not present

## 2012-07-06 DIAGNOSIS — R011 Cardiac murmur, unspecified: Secondary | ICD-10-CM | POA: Diagnosis not present

## 2012-07-06 DIAGNOSIS — Z7901 Long term (current) use of anticoagulants: Secondary | ICD-10-CM | POA: Diagnosis not present

## 2012-07-06 DIAGNOSIS — I4891 Unspecified atrial fibrillation: Secondary | ICD-10-CM | POA: Diagnosis not present

## 2012-07-06 DIAGNOSIS — E785 Hyperlipidemia, unspecified: Secondary | ICD-10-CM | POA: Diagnosis not present

## 2012-07-17 DIAGNOSIS — E785 Hyperlipidemia, unspecified: Secondary | ICD-10-CM | POA: Diagnosis not present

## 2012-07-17 DIAGNOSIS — R011 Cardiac murmur, unspecified: Secondary | ICD-10-CM | POA: Diagnosis not present

## 2012-07-17 DIAGNOSIS — I4891 Unspecified atrial fibrillation: Secondary | ICD-10-CM | POA: Diagnosis not present

## 2012-07-17 DIAGNOSIS — I872 Venous insufficiency (chronic) (peripheral): Secondary | ICD-10-CM | POA: Diagnosis not present

## 2012-07-17 DIAGNOSIS — Z7901 Long term (current) use of anticoagulants: Secondary | ICD-10-CM | POA: Diagnosis not present

## 2012-07-17 DIAGNOSIS — Z950 Presence of cardiac pacemaker: Secondary | ICD-10-CM | POA: Diagnosis not present

## 2012-07-17 DIAGNOSIS — E039 Hypothyroidism, unspecified: Secondary | ICD-10-CM | POA: Diagnosis not present

## 2012-07-21 DIAGNOSIS — M25569 Pain in unspecified knee: Secondary | ICD-10-CM | POA: Diagnosis not present

## 2012-07-24 DIAGNOSIS — Z7901 Long term (current) use of anticoagulants: Secondary | ICD-10-CM | POA: Diagnosis not present

## 2012-07-24 DIAGNOSIS — Z950 Presence of cardiac pacemaker: Secondary | ICD-10-CM | POA: Diagnosis not present

## 2012-07-24 DIAGNOSIS — E039 Hypothyroidism, unspecified: Secondary | ICD-10-CM | POA: Diagnosis not present

## 2012-07-24 DIAGNOSIS — R011 Cardiac murmur, unspecified: Secondary | ICD-10-CM | POA: Diagnosis not present

## 2012-07-24 DIAGNOSIS — I872 Venous insufficiency (chronic) (peripheral): Secondary | ICD-10-CM | POA: Diagnosis not present

## 2012-07-24 DIAGNOSIS — E785 Hyperlipidemia, unspecified: Secondary | ICD-10-CM | POA: Diagnosis not present

## 2012-07-24 DIAGNOSIS — I4891 Unspecified atrial fibrillation: Secondary | ICD-10-CM | POA: Diagnosis not present

## 2012-08-04 DIAGNOSIS — I872 Venous insufficiency (chronic) (peripheral): Secondary | ICD-10-CM | POA: Diagnosis not present

## 2012-08-04 DIAGNOSIS — Z7901 Long term (current) use of anticoagulants: Secondary | ICD-10-CM | POA: Diagnosis not present

## 2012-08-04 DIAGNOSIS — E785 Hyperlipidemia, unspecified: Secondary | ICD-10-CM | POA: Diagnosis not present

## 2012-08-04 DIAGNOSIS — E039 Hypothyroidism, unspecified: Secondary | ICD-10-CM | POA: Diagnosis not present

## 2012-08-04 DIAGNOSIS — I4891 Unspecified atrial fibrillation: Secondary | ICD-10-CM | POA: Diagnosis not present

## 2012-08-04 DIAGNOSIS — R609 Edema, unspecified: Secondary | ICD-10-CM | POA: Diagnosis not present

## 2012-08-04 DIAGNOSIS — Z950 Presence of cardiac pacemaker: Secondary | ICD-10-CM | POA: Diagnosis not present

## 2012-08-04 DIAGNOSIS — R011 Cardiac murmur, unspecified: Secondary | ICD-10-CM | POA: Diagnosis not present

## 2012-08-18 DIAGNOSIS — I872 Venous insufficiency (chronic) (peripheral): Secondary | ICD-10-CM | POA: Diagnosis not present

## 2012-08-18 DIAGNOSIS — M1710 Unilateral primary osteoarthritis, unspecified knee: Secondary | ICD-10-CM | POA: Diagnosis not present

## 2012-08-18 DIAGNOSIS — R011 Cardiac murmur, unspecified: Secondary | ICD-10-CM | POA: Diagnosis not present

## 2012-08-18 DIAGNOSIS — E785 Hyperlipidemia, unspecified: Secondary | ICD-10-CM | POA: Diagnosis not present

## 2012-08-18 DIAGNOSIS — Z950 Presence of cardiac pacemaker: Secondary | ICD-10-CM | POA: Diagnosis not present

## 2012-08-18 DIAGNOSIS — R609 Edema, unspecified: Secondary | ICD-10-CM | POA: Diagnosis not present

## 2012-08-18 DIAGNOSIS — Z7901 Long term (current) use of anticoagulants: Secondary | ICD-10-CM | POA: Diagnosis not present

## 2012-08-18 DIAGNOSIS — E039 Hypothyroidism, unspecified: Secondary | ICD-10-CM | POA: Diagnosis not present

## 2012-08-18 DIAGNOSIS — I4891 Unspecified atrial fibrillation: Secondary | ICD-10-CM | POA: Diagnosis not present

## 2012-09-12 DIAGNOSIS — R209 Unspecified disturbances of skin sensation: Secondary | ICD-10-CM | POA: Diagnosis not present

## 2012-09-12 DIAGNOSIS — G5600 Carpal tunnel syndrome, unspecified upper limb: Secondary | ICD-10-CM | POA: Diagnosis not present

## 2012-09-15 DIAGNOSIS — I872 Venous insufficiency (chronic) (peripheral): Secondary | ICD-10-CM | POA: Diagnosis not present

## 2012-09-15 DIAGNOSIS — R011 Cardiac murmur, unspecified: Secondary | ICD-10-CM | POA: Diagnosis not present

## 2012-09-15 DIAGNOSIS — Z950 Presence of cardiac pacemaker: Secondary | ICD-10-CM | POA: Diagnosis not present

## 2012-09-15 DIAGNOSIS — E785 Hyperlipidemia, unspecified: Secondary | ICD-10-CM | POA: Diagnosis not present

## 2012-09-15 DIAGNOSIS — E039 Hypothyroidism, unspecified: Secondary | ICD-10-CM | POA: Diagnosis not present

## 2012-09-15 DIAGNOSIS — Z7901 Long term (current) use of anticoagulants: Secondary | ICD-10-CM | POA: Diagnosis not present

## 2012-09-15 DIAGNOSIS — I4891 Unspecified atrial fibrillation: Secondary | ICD-10-CM | POA: Diagnosis not present

## 2012-09-15 DIAGNOSIS — R609 Edema, unspecified: Secondary | ICD-10-CM | POA: Diagnosis not present

## 2012-09-18 ENCOUNTER — Ambulatory Visit
Admission: RE | Admit: 2012-09-18 | Discharge: 2012-09-18 | Disposition: A | Discharge status: Home / Self Care | Payer: Medicare Other | Source: Ambulatory Visit | Attending: Internal Medicine | Admitting: Internal Medicine

## 2012-09-18 ENCOUNTER — Other Ambulatory Visit: Payer: Self-pay | Admitting: Internal Medicine

## 2012-09-18 DIAGNOSIS — R5381 Other malaise: Secondary | ICD-10-CM | POA: Diagnosis not present

## 2012-09-18 DIAGNOSIS — I4891 Unspecified atrial fibrillation: Secondary | ICD-10-CM | POA: Diagnosis not present

## 2012-09-18 DIAGNOSIS — R209 Unspecified disturbances of skin sensation: Secondary | ICD-10-CM | POA: Diagnosis not present

## 2012-09-18 DIAGNOSIS — Z6826 Body mass index (BMI) 26.0-26.9, adult: Secondary | ICD-10-CM | POA: Diagnosis not present

## 2012-09-18 DIAGNOSIS — R5383 Other fatigue: Secondary | ICD-10-CM | POA: Diagnosis not present

## 2012-09-18 DIAGNOSIS — R42 Dizziness and giddiness: Secondary | ICD-10-CM | POA: Diagnosis not present

## 2012-09-18 DIAGNOSIS — E039 Hypothyroidism, unspecified: Secondary | ICD-10-CM | POA: Diagnosis not present

## 2012-09-18 DIAGNOSIS — E785 Hyperlipidemia, unspecified: Secondary | ICD-10-CM | POA: Diagnosis not present

## 2012-09-18 DIAGNOSIS — M1710 Unilateral primary osteoarthritis, unspecified knee: Secondary | ICD-10-CM | POA: Diagnosis not present

## 2012-10-06 DIAGNOSIS — R609 Edema, unspecified: Secondary | ICD-10-CM | POA: Diagnosis not present

## 2012-10-06 DIAGNOSIS — E039 Hypothyroidism, unspecified: Secondary | ICD-10-CM | POA: Diagnosis not present

## 2012-10-06 DIAGNOSIS — Z950 Presence of cardiac pacemaker: Secondary | ICD-10-CM | POA: Diagnosis not present

## 2012-10-06 DIAGNOSIS — Z7901 Long term (current) use of anticoagulants: Secondary | ICD-10-CM | POA: Diagnosis not present

## 2012-10-06 DIAGNOSIS — R011 Cardiac murmur, unspecified: Secondary | ICD-10-CM | POA: Diagnosis not present

## 2012-10-06 DIAGNOSIS — E785 Hyperlipidemia, unspecified: Secondary | ICD-10-CM | POA: Diagnosis not present

## 2012-10-06 DIAGNOSIS — I4891 Unspecified atrial fibrillation: Secondary | ICD-10-CM | POA: Diagnosis not present

## 2012-10-06 DIAGNOSIS — I872 Venous insufficiency (chronic) (peripheral): Secondary | ICD-10-CM | POA: Diagnosis not present

## 2012-10-23 DIAGNOSIS — I872 Venous insufficiency (chronic) (peripheral): Secondary | ICD-10-CM | POA: Diagnosis not present

## 2012-10-23 DIAGNOSIS — Z950 Presence of cardiac pacemaker: Secondary | ICD-10-CM | POA: Diagnosis not present

## 2012-10-23 DIAGNOSIS — I4891 Unspecified atrial fibrillation: Secondary | ICD-10-CM | POA: Diagnosis not present

## 2012-10-23 DIAGNOSIS — E785 Hyperlipidemia, unspecified: Secondary | ICD-10-CM | POA: Diagnosis not present

## 2012-10-23 DIAGNOSIS — E039 Hypothyroidism, unspecified: Secondary | ICD-10-CM | POA: Diagnosis not present

## 2012-10-23 DIAGNOSIS — R609 Edema, unspecified: Secondary | ICD-10-CM | POA: Diagnosis not present

## 2012-10-23 DIAGNOSIS — R011 Cardiac murmur, unspecified: Secondary | ICD-10-CM | POA: Diagnosis not present

## 2012-10-23 DIAGNOSIS — Z7901 Long term (current) use of anticoagulants: Secondary | ICD-10-CM | POA: Diagnosis not present

## 2012-10-24 DIAGNOSIS — G5600 Carpal tunnel syndrome, unspecified upper limb: Secondary | ICD-10-CM | POA: Diagnosis not present

## 2012-11-09 DIAGNOSIS — E039 Hypothyroidism, unspecified: Secondary | ICD-10-CM | POA: Diagnosis not present

## 2012-11-09 DIAGNOSIS — Z7901 Long term (current) use of anticoagulants: Secondary | ICD-10-CM | POA: Diagnosis not present

## 2012-11-09 DIAGNOSIS — R609 Edema, unspecified: Secondary | ICD-10-CM | POA: Diagnosis not present

## 2012-11-09 DIAGNOSIS — E785 Hyperlipidemia, unspecified: Secondary | ICD-10-CM | POA: Diagnosis not present

## 2012-11-09 DIAGNOSIS — I4891 Unspecified atrial fibrillation: Secondary | ICD-10-CM | POA: Diagnosis not present

## 2012-11-09 DIAGNOSIS — Z950 Presence of cardiac pacemaker: Secondary | ICD-10-CM | POA: Diagnosis not present

## 2012-11-09 DIAGNOSIS — I872 Venous insufficiency (chronic) (peripheral): Secondary | ICD-10-CM | POA: Diagnosis not present

## 2012-11-09 DIAGNOSIS — R011 Cardiac murmur, unspecified: Secondary | ICD-10-CM | POA: Diagnosis not present

## 2012-12-05 DIAGNOSIS — R209 Unspecified disturbances of skin sensation: Secondary | ICD-10-CM | POA: Diagnosis not present

## 2012-12-07 DIAGNOSIS — E039 Hypothyroidism, unspecified: Secondary | ICD-10-CM | POA: Diagnosis not present

## 2012-12-07 DIAGNOSIS — R609 Edema, unspecified: Secondary | ICD-10-CM | POA: Diagnosis not present

## 2012-12-07 DIAGNOSIS — E785 Hyperlipidemia, unspecified: Secondary | ICD-10-CM | POA: Diagnosis not present

## 2012-12-07 DIAGNOSIS — R011 Cardiac murmur, unspecified: Secondary | ICD-10-CM | POA: Diagnosis not present

## 2012-12-07 DIAGNOSIS — I4891 Unspecified atrial fibrillation: Secondary | ICD-10-CM | POA: Diagnosis not present

## 2012-12-07 DIAGNOSIS — Z950 Presence of cardiac pacemaker: Secondary | ICD-10-CM | POA: Diagnosis not present

## 2012-12-07 DIAGNOSIS — Z7901 Long term (current) use of anticoagulants: Secondary | ICD-10-CM | POA: Diagnosis not present

## 2012-12-07 DIAGNOSIS — I872 Venous insufficiency (chronic) (peripheral): Secondary | ICD-10-CM | POA: Diagnosis not present

## 2012-12-08 DIAGNOSIS — M1710 Unilateral primary osteoarthritis, unspecified knee: Secondary | ICD-10-CM | POA: Diagnosis not present

## 2012-12-08 DIAGNOSIS — E039 Hypothyroidism, unspecified: Secondary | ICD-10-CM | POA: Diagnosis not present

## 2012-12-08 DIAGNOSIS — Z23 Encounter for immunization: Secondary | ICD-10-CM | POA: Diagnosis not present

## 2012-12-08 DIAGNOSIS — E785 Hyperlipidemia, unspecified: Secondary | ICD-10-CM | POA: Diagnosis not present

## 2012-12-08 DIAGNOSIS — Z6827 Body mass index (BMI) 27.0-27.9, adult: Secondary | ICD-10-CM | POA: Diagnosis not present

## 2012-12-08 DIAGNOSIS — F329 Major depressive disorder, single episode, unspecified: Secondary | ICD-10-CM | POA: Diagnosis not present

## 2012-12-08 DIAGNOSIS — I4891 Unspecified atrial fibrillation: Secondary | ICD-10-CM | POA: Diagnosis not present

## 2012-12-15 DIAGNOSIS — L509 Urticaria, unspecified: Secondary | ICD-10-CM | POA: Diagnosis not present

## 2012-12-15 DIAGNOSIS — H1045 Other chronic allergic conjunctivitis: Secondary | ICD-10-CM | POA: Diagnosis not present

## 2012-12-15 DIAGNOSIS — J3089 Other allergic rhinitis: Secondary | ICD-10-CM | POA: Diagnosis not present

## 2012-12-25 DIAGNOSIS — R209 Unspecified disturbances of skin sensation: Secondary | ICD-10-CM | POA: Diagnosis not present

## 2013-01-02 DIAGNOSIS — R209 Unspecified disturbances of skin sensation: Secondary | ICD-10-CM | POA: Diagnosis not present

## 2013-01-02 DIAGNOSIS — G5600 Carpal tunnel syndrome, unspecified upper limb: Secondary | ICD-10-CM | POA: Diagnosis not present

## 2013-01-05 DIAGNOSIS — E039 Hypothyroidism, unspecified: Secondary | ICD-10-CM | POA: Diagnosis not present

## 2013-01-05 DIAGNOSIS — I4891 Unspecified atrial fibrillation: Secondary | ICD-10-CM | POA: Diagnosis not present

## 2013-01-05 DIAGNOSIS — Z7901 Long term (current) use of anticoagulants: Secondary | ICD-10-CM | POA: Diagnosis not present

## 2013-01-05 DIAGNOSIS — E785 Hyperlipidemia, unspecified: Secondary | ICD-10-CM | POA: Diagnosis not present

## 2013-01-05 DIAGNOSIS — R609 Edema, unspecified: Secondary | ICD-10-CM | POA: Diagnosis not present

## 2013-01-05 DIAGNOSIS — R011 Cardiac murmur, unspecified: Secondary | ICD-10-CM | POA: Diagnosis not present

## 2013-01-05 DIAGNOSIS — I872 Venous insufficiency (chronic) (peripheral): Secondary | ICD-10-CM | POA: Diagnosis not present

## 2013-01-05 DIAGNOSIS — Z950 Presence of cardiac pacemaker: Secondary | ICD-10-CM | POA: Diagnosis not present

## 2013-01-19 DIAGNOSIS — R609 Edema, unspecified: Secondary | ICD-10-CM | POA: Diagnosis not present

## 2013-01-19 DIAGNOSIS — I4891 Unspecified atrial fibrillation: Secondary | ICD-10-CM | POA: Diagnosis not present

## 2013-01-19 DIAGNOSIS — I872 Venous insufficiency (chronic) (peripheral): Secondary | ICD-10-CM | POA: Diagnosis not present

## 2013-01-19 DIAGNOSIS — Z950 Presence of cardiac pacemaker: Secondary | ICD-10-CM | POA: Diagnosis not present

## 2013-01-19 DIAGNOSIS — R011 Cardiac murmur, unspecified: Secondary | ICD-10-CM | POA: Diagnosis not present

## 2013-01-19 DIAGNOSIS — E039 Hypothyroidism, unspecified: Secondary | ICD-10-CM | POA: Diagnosis not present

## 2013-01-19 DIAGNOSIS — E785 Hyperlipidemia, unspecified: Secondary | ICD-10-CM | POA: Diagnosis not present

## 2013-01-19 DIAGNOSIS — Z7901 Long term (current) use of anticoagulants: Secondary | ICD-10-CM | POA: Diagnosis not present

## 2013-01-26 DIAGNOSIS — R609 Edema, unspecified: Secondary | ICD-10-CM | POA: Diagnosis not present

## 2013-01-26 DIAGNOSIS — E039 Hypothyroidism, unspecified: Secondary | ICD-10-CM | POA: Diagnosis not present

## 2013-01-26 DIAGNOSIS — I4891 Unspecified atrial fibrillation: Secondary | ICD-10-CM | POA: Diagnosis not present

## 2013-01-26 DIAGNOSIS — Z7901 Long term (current) use of anticoagulants: Secondary | ICD-10-CM | POA: Diagnosis not present

## 2013-01-26 DIAGNOSIS — Z950 Presence of cardiac pacemaker: Secondary | ICD-10-CM | POA: Diagnosis not present

## 2013-01-26 DIAGNOSIS — E785 Hyperlipidemia, unspecified: Secondary | ICD-10-CM | POA: Diagnosis not present

## 2013-01-26 DIAGNOSIS — R011 Cardiac murmur, unspecified: Secondary | ICD-10-CM | POA: Diagnosis not present

## 2013-01-26 DIAGNOSIS — I872 Venous insufficiency (chronic) (peripheral): Secondary | ICD-10-CM | POA: Diagnosis not present

## 2013-02-06 DIAGNOSIS — R209 Unspecified disturbances of skin sensation: Secondary | ICD-10-CM | POA: Diagnosis not present

## 2013-02-28 DIAGNOSIS — R011 Cardiac murmur, unspecified: Secondary | ICD-10-CM | POA: Diagnosis not present

## 2013-02-28 DIAGNOSIS — Z7901 Long term (current) use of anticoagulants: Secondary | ICD-10-CM | POA: Diagnosis not present

## 2013-02-28 DIAGNOSIS — I872 Venous insufficiency (chronic) (peripheral): Secondary | ICD-10-CM | POA: Diagnosis not present

## 2013-02-28 DIAGNOSIS — Z950 Presence of cardiac pacemaker: Secondary | ICD-10-CM | POA: Diagnosis not present

## 2013-02-28 DIAGNOSIS — E039 Hypothyroidism, unspecified: Secondary | ICD-10-CM | POA: Diagnosis not present

## 2013-02-28 DIAGNOSIS — E785 Hyperlipidemia, unspecified: Secondary | ICD-10-CM | POA: Diagnosis not present

## 2013-02-28 DIAGNOSIS — R609 Edema, unspecified: Secondary | ICD-10-CM | POA: Diagnosis not present

## 2013-02-28 DIAGNOSIS — I4891 Unspecified atrial fibrillation: Secondary | ICD-10-CM | POA: Diagnosis not present

## 2013-04-06 DIAGNOSIS — G5600 Carpal tunnel syndrome, unspecified upper limb: Secondary | ICD-10-CM | POA: Diagnosis not present

## 2013-04-06 DIAGNOSIS — R209 Unspecified disturbances of skin sensation: Secondary | ICD-10-CM | POA: Diagnosis not present

## 2013-04-16 DIAGNOSIS — I872 Venous insufficiency (chronic) (peripheral): Secondary | ICD-10-CM | POA: Diagnosis not present

## 2013-04-16 DIAGNOSIS — E785 Hyperlipidemia, unspecified: Secondary | ICD-10-CM | POA: Diagnosis not present

## 2013-04-16 DIAGNOSIS — Z7901 Long term (current) use of anticoagulants: Secondary | ICD-10-CM | POA: Diagnosis not present

## 2013-04-16 DIAGNOSIS — E039 Hypothyroidism, unspecified: Secondary | ICD-10-CM | POA: Diagnosis not present

## 2013-04-16 DIAGNOSIS — Z950 Presence of cardiac pacemaker: Secondary | ICD-10-CM | POA: Diagnosis not present

## 2013-04-16 DIAGNOSIS — R011 Cardiac murmur, unspecified: Secondary | ICD-10-CM | POA: Diagnosis not present

## 2013-04-16 DIAGNOSIS — R609 Edema, unspecified: Secondary | ICD-10-CM | POA: Diagnosis not present

## 2013-04-16 DIAGNOSIS — I4891 Unspecified atrial fibrillation: Secondary | ICD-10-CM | POA: Diagnosis not present

## 2013-04-18 DIAGNOSIS — H5231 Anisometropia: Secondary | ICD-10-CM | POA: Diagnosis not present

## 2013-04-18 DIAGNOSIS — Z961 Presence of intraocular lens: Secondary | ICD-10-CM | POA: Diagnosis not present

## 2013-04-18 DIAGNOSIS — H43819 Vitreous degeneration, unspecified eye: Secondary | ICD-10-CM | POA: Diagnosis not present

## 2013-04-18 DIAGNOSIS — H5315 Visual distortions of shape and size: Secondary | ICD-10-CM | POA: Diagnosis not present

## 2013-05-01 DIAGNOSIS — I872 Venous insufficiency (chronic) (peripheral): Secondary | ICD-10-CM | POA: Diagnosis not present

## 2013-05-01 DIAGNOSIS — E785 Hyperlipidemia, unspecified: Secondary | ICD-10-CM | POA: Diagnosis not present

## 2013-05-01 DIAGNOSIS — Z950 Presence of cardiac pacemaker: Secondary | ICD-10-CM | POA: Diagnosis not present

## 2013-05-01 DIAGNOSIS — E039 Hypothyroidism, unspecified: Secondary | ICD-10-CM | POA: Diagnosis not present

## 2013-05-01 DIAGNOSIS — R011 Cardiac murmur, unspecified: Secondary | ICD-10-CM | POA: Diagnosis not present

## 2013-05-01 DIAGNOSIS — R609 Edema, unspecified: Secondary | ICD-10-CM | POA: Diagnosis not present

## 2013-05-01 DIAGNOSIS — Z7901 Long term (current) use of anticoagulants: Secondary | ICD-10-CM | POA: Diagnosis not present

## 2013-05-01 DIAGNOSIS — I4891 Unspecified atrial fibrillation: Secondary | ICD-10-CM | POA: Diagnosis not present

## 2013-05-21 DIAGNOSIS — E039 Hypothyroidism, unspecified: Secondary | ICD-10-CM | POA: Diagnosis not present

## 2013-05-21 DIAGNOSIS — I872 Venous insufficiency (chronic) (peripheral): Secondary | ICD-10-CM | POA: Diagnosis not present

## 2013-05-21 DIAGNOSIS — R011 Cardiac murmur, unspecified: Secondary | ICD-10-CM | POA: Diagnosis not present

## 2013-05-21 DIAGNOSIS — Z950 Presence of cardiac pacemaker: Secondary | ICD-10-CM | POA: Diagnosis not present

## 2013-05-21 DIAGNOSIS — Z7901 Long term (current) use of anticoagulants: Secondary | ICD-10-CM | POA: Diagnosis not present

## 2013-05-21 DIAGNOSIS — R609 Edema, unspecified: Secondary | ICD-10-CM | POA: Diagnosis not present

## 2013-05-21 DIAGNOSIS — E785 Hyperlipidemia, unspecified: Secondary | ICD-10-CM | POA: Diagnosis not present

## 2013-05-21 DIAGNOSIS — I4891 Unspecified atrial fibrillation: Secondary | ICD-10-CM | POA: Diagnosis not present

## 2013-05-31 DIAGNOSIS — E785 Hyperlipidemia, unspecified: Secondary | ICD-10-CM | POA: Diagnosis not present

## 2013-05-31 DIAGNOSIS — I4891 Unspecified atrial fibrillation: Secondary | ICD-10-CM | POA: Diagnosis not present

## 2013-05-31 DIAGNOSIS — E039 Hypothyroidism, unspecified: Secondary | ICD-10-CM | POA: Diagnosis not present

## 2013-06-07 DIAGNOSIS — G589 Mononeuropathy, unspecified: Secondary | ICD-10-CM | POA: Diagnosis not present

## 2013-06-07 DIAGNOSIS — I4891 Unspecified atrial fibrillation: Secondary | ICD-10-CM | POA: Diagnosis not present

## 2013-06-07 DIAGNOSIS — Z6827 Body mass index (BMI) 27.0-27.9, adult: Secondary | ICD-10-CM | POA: Diagnosis not present

## 2013-06-07 DIAGNOSIS — E039 Hypothyroidism, unspecified: Secondary | ICD-10-CM | POA: Diagnosis not present

## 2013-06-07 DIAGNOSIS — Z1331 Encounter for screening for depression: Secondary | ICD-10-CM | POA: Diagnosis not present

## 2013-06-07 DIAGNOSIS — E785 Hyperlipidemia, unspecified: Secondary | ICD-10-CM | POA: Diagnosis not present

## 2013-06-07 DIAGNOSIS — Z0000 Encounter for general adult medical examination without abnormal findings: Secondary | ICD-10-CM | POA: Diagnosis not present

## 2013-06-11 DIAGNOSIS — Z1212 Encounter for screening for malignant neoplasm of rectum: Secondary | ICD-10-CM | POA: Diagnosis not present

## 2013-06-19 DIAGNOSIS — M1710 Unilateral primary osteoarthritis, unspecified knee: Secondary | ICD-10-CM | POA: Diagnosis not present

## 2013-06-19 DIAGNOSIS — IMO0002 Reserved for concepts with insufficient information to code with codable children: Secondary | ICD-10-CM | POA: Diagnosis not present

## 2013-06-19 DIAGNOSIS — M545 Low back pain, unspecified: Secondary | ICD-10-CM | POA: Diagnosis not present

## 2013-06-19 DIAGNOSIS — Z6827 Body mass index (BMI) 27.0-27.9, adult: Secondary | ICD-10-CM | POA: Diagnosis not present

## 2013-08-03 DIAGNOSIS — Z950 Presence of cardiac pacemaker: Secondary | ICD-10-CM | POA: Diagnosis not present

## 2013-08-03 DIAGNOSIS — I872 Venous insufficiency (chronic) (peripheral): Secondary | ICD-10-CM | POA: Diagnosis not present

## 2013-08-03 DIAGNOSIS — E785 Hyperlipidemia, unspecified: Secondary | ICD-10-CM | POA: Diagnosis not present

## 2013-08-03 DIAGNOSIS — I4891 Unspecified atrial fibrillation: Secondary | ICD-10-CM | POA: Diagnosis not present

## 2013-08-03 DIAGNOSIS — Z7901 Long term (current) use of anticoagulants: Secondary | ICD-10-CM | POA: Diagnosis not present

## 2013-08-03 DIAGNOSIS — E039 Hypothyroidism, unspecified: Secondary | ICD-10-CM | POA: Diagnosis not present

## 2013-08-03 DIAGNOSIS — R011 Cardiac murmur, unspecified: Secondary | ICD-10-CM | POA: Diagnosis not present

## 2013-08-31 DIAGNOSIS — L738 Other specified follicular disorders: Secondary | ICD-10-CM | POA: Diagnosis not present

## 2013-08-31 DIAGNOSIS — L821 Other seborrheic keratosis: Secondary | ICD-10-CM | POA: Diagnosis not present

## 2013-08-31 DIAGNOSIS — L678 Other hair color and hair shaft abnormalities: Secondary | ICD-10-CM | POA: Diagnosis not present

## 2013-08-31 DIAGNOSIS — L820 Inflamed seborrheic keratosis: Secondary | ICD-10-CM | POA: Diagnosis not present

## 2013-08-31 DIAGNOSIS — D1801 Hemangioma of skin and subcutaneous tissue: Secondary | ICD-10-CM | POA: Diagnosis not present

## 2013-08-31 DIAGNOSIS — Z7901 Long term (current) use of anticoagulants: Secondary | ICD-10-CM | POA: Diagnosis not present

## 2013-09-05 DIAGNOSIS — I4891 Unspecified atrial fibrillation: Secondary | ICD-10-CM | POA: Diagnosis not present

## 2013-09-05 DIAGNOSIS — Z950 Presence of cardiac pacemaker: Secondary | ICD-10-CM | POA: Diagnosis not present

## 2013-09-05 DIAGNOSIS — Z7901 Long term (current) use of anticoagulants: Secondary | ICD-10-CM | POA: Diagnosis not present

## 2013-09-05 DIAGNOSIS — E039 Hypothyroidism, unspecified: Secondary | ICD-10-CM | POA: Diagnosis not present

## 2013-09-05 DIAGNOSIS — I872 Venous insufficiency (chronic) (peripheral): Secondary | ICD-10-CM | POA: Diagnosis not present

## 2013-09-05 DIAGNOSIS — R011 Cardiac murmur, unspecified: Secondary | ICD-10-CM | POA: Diagnosis not present

## 2013-09-05 DIAGNOSIS — E785 Hyperlipidemia, unspecified: Secondary | ICD-10-CM | POA: Diagnosis not present

## 2013-09-19 DIAGNOSIS — I872 Venous insufficiency (chronic) (peripheral): Secondary | ICD-10-CM | POA: Diagnosis not present

## 2013-09-19 DIAGNOSIS — R011 Cardiac murmur, unspecified: Secondary | ICD-10-CM | POA: Diagnosis not present

## 2013-09-19 DIAGNOSIS — I4891 Unspecified atrial fibrillation: Secondary | ICD-10-CM | POA: Diagnosis not present

## 2013-09-19 DIAGNOSIS — Z7901 Long term (current) use of anticoagulants: Secondary | ICD-10-CM | POA: Diagnosis not present

## 2013-09-19 DIAGNOSIS — Z950 Presence of cardiac pacemaker: Secondary | ICD-10-CM | POA: Diagnosis not present

## 2013-09-19 DIAGNOSIS — E039 Hypothyroidism, unspecified: Secondary | ICD-10-CM | POA: Diagnosis not present

## 2013-09-19 DIAGNOSIS — E785 Hyperlipidemia, unspecified: Secondary | ICD-10-CM | POA: Diagnosis not present

## 2013-09-20 DIAGNOSIS — L738 Other specified follicular disorders: Secondary | ICD-10-CM | POA: Diagnosis not present

## 2013-09-20 DIAGNOSIS — L821 Other seborrheic keratosis: Secondary | ICD-10-CM | POA: Diagnosis not present

## 2013-09-20 DIAGNOSIS — L678 Other hair color and hair shaft abnormalities: Secondary | ICD-10-CM | POA: Diagnosis not present

## 2013-10-09 DIAGNOSIS — M79609 Pain in unspecified limb: Secondary | ICD-10-CM | POA: Diagnosis not present

## 2013-10-09 DIAGNOSIS — M722 Plantar fascial fibromatosis: Secondary | ICD-10-CM | POA: Diagnosis not present

## 2013-10-19 DIAGNOSIS — R011 Cardiac murmur, unspecified: Secondary | ICD-10-CM | POA: Diagnosis not present

## 2013-10-19 DIAGNOSIS — E785 Hyperlipidemia, unspecified: Secondary | ICD-10-CM | POA: Diagnosis not present

## 2013-10-19 DIAGNOSIS — Z7901 Long term (current) use of anticoagulants: Secondary | ICD-10-CM | POA: Diagnosis not present

## 2013-10-19 DIAGNOSIS — E039 Hypothyroidism, unspecified: Secondary | ICD-10-CM | POA: Diagnosis not present

## 2013-10-19 DIAGNOSIS — I872 Venous insufficiency (chronic) (peripheral): Secondary | ICD-10-CM | POA: Diagnosis not present

## 2013-10-19 DIAGNOSIS — I4891 Unspecified atrial fibrillation: Secondary | ICD-10-CM | POA: Diagnosis not present

## 2013-10-19 DIAGNOSIS — Z950 Presence of cardiac pacemaker: Secondary | ICD-10-CM | POA: Diagnosis not present

## 2013-11-02 DIAGNOSIS — E039 Hypothyroidism, unspecified: Secondary | ICD-10-CM | POA: Diagnosis not present

## 2013-11-02 DIAGNOSIS — E785 Hyperlipidemia, unspecified: Secondary | ICD-10-CM | POA: Diagnosis not present

## 2013-11-02 DIAGNOSIS — R209 Unspecified disturbances of skin sensation: Secondary | ICD-10-CM | POA: Diagnosis not present

## 2013-11-02 DIAGNOSIS — R011 Cardiac murmur, unspecified: Secondary | ICD-10-CM | POA: Diagnosis not present

## 2013-11-02 DIAGNOSIS — Z7901 Long term (current) use of anticoagulants: Secondary | ICD-10-CM | POA: Diagnosis not present

## 2013-11-02 DIAGNOSIS — Z950 Presence of cardiac pacemaker: Secondary | ICD-10-CM | POA: Diagnosis not present

## 2013-11-02 DIAGNOSIS — I4891 Unspecified atrial fibrillation: Secondary | ICD-10-CM | POA: Diagnosis not present

## 2013-11-02 DIAGNOSIS — I872 Venous insufficiency (chronic) (peripheral): Secondary | ICD-10-CM | POA: Diagnosis not present

## 2013-11-06 DIAGNOSIS — M79609 Pain in unspecified limb: Secondary | ICD-10-CM | POA: Diagnosis not present

## 2013-11-06 DIAGNOSIS — M722 Plantar fascial fibromatosis: Secondary | ICD-10-CM | POA: Diagnosis not present

## 2013-11-09 DIAGNOSIS — Z471 Aftercare following joint replacement surgery: Secondary | ICD-10-CM | POA: Diagnosis not present

## 2013-11-30 DIAGNOSIS — R011 Cardiac murmur, unspecified: Secondary | ICD-10-CM | POA: Diagnosis not present

## 2013-11-30 DIAGNOSIS — E039 Hypothyroidism, unspecified: Secondary | ICD-10-CM | POA: Diagnosis not present

## 2013-11-30 DIAGNOSIS — I872 Venous insufficiency (chronic) (peripheral): Secondary | ICD-10-CM | POA: Diagnosis not present

## 2013-11-30 DIAGNOSIS — Z950 Presence of cardiac pacemaker: Secondary | ICD-10-CM | POA: Diagnosis not present

## 2013-11-30 DIAGNOSIS — E785 Hyperlipidemia, unspecified: Secondary | ICD-10-CM | POA: Diagnosis not present

## 2013-11-30 DIAGNOSIS — I4891 Unspecified atrial fibrillation: Secondary | ICD-10-CM | POA: Diagnosis not present

## 2013-11-30 DIAGNOSIS — Z7901 Long term (current) use of anticoagulants: Secondary | ICD-10-CM | POA: Diagnosis not present

## 2013-12-07 DIAGNOSIS — S0010XA Contusion of unspecified eyelid and periocular area, initial encounter: Secondary | ICD-10-CM | POA: Diagnosis not present

## 2013-12-14 DIAGNOSIS — J3089 Other allergic rhinitis: Secondary | ICD-10-CM | POA: Diagnosis not present

## 2013-12-14 DIAGNOSIS — H1045 Other chronic allergic conjunctivitis: Secondary | ICD-10-CM | POA: Diagnosis not present

## 2013-12-14 DIAGNOSIS — L509 Urticaria, unspecified: Secondary | ICD-10-CM | POA: Diagnosis not present

## 2013-12-15 DIAGNOSIS — I4891 Unspecified atrial fibrillation: Secondary | ICD-10-CM | POA: Diagnosis not present

## 2013-12-15 DIAGNOSIS — Z7901 Long term (current) use of anticoagulants: Secondary | ICD-10-CM | POA: Diagnosis not present

## 2013-12-15 DIAGNOSIS — E785 Hyperlipidemia, unspecified: Secondary | ICD-10-CM | POA: Diagnosis not present

## 2013-12-15 DIAGNOSIS — R011 Cardiac murmur, unspecified: Secondary | ICD-10-CM | POA: Diagnosis not present

## 2013-12-15 DIAGNOSIS — Z950 Presence of cardiac pacemaker: Secondary | ICD-10-CM | POA: Diagnosis not present

## 2013-12-15 DIAGNOSIS — E039 Hypothyroidism, unspecified: Secondary | ICD-10-CM | POA: Diagnosis not present

## 2013-12-15 DIAGNOSIS — I872 Venous insufficiency (chronic) (peripheral): Secondary | ICD-10-CM | POA: Diagnosis not present

## 2013-12-21 DIAGNOSIS — E039 Hypothyroidism, unspecified: Secondary | ICD-10-CM | POA: Diagnosis not present

## 2013-12-21 DIAGNOSIS — M1712 Unilateral primary osteoarthritis, left knee: Secondary | ICD-10-CM | POA: Diagnosis not present

## 2013-12-21 DIAGNOSIS — I480 Paroxysmal atrial fibrillation: Secondary | ICD-10-CM | POA: Diagnosis not present

## 2013-12-21 DIAGNOSIS — Z23 Encounter for immunization: Secondary | ICD-10-CM | POA: Diagnosis not present

## 2013-12-21 DIAGNOSIS — Z6827 Body mass index (BMI) 27.0-27.9, adult: Secondary | ICD-10-CM | POA: Diagnosis not present

## 2013-12-21 DIAGNOSIS — E785 Hyperlipidemia, unspecified: Secondary | ICD-10-CM | POA: Diagnosis not present

## 2013-12-21 DIAGNOSIS — G629 Polyneuropathy, unspecified: Secondary | ICD-10-CM | POA: Diagnosis not present

## 2013-12-25 DIAGNOSIS — S7001XA Contusion of right hip, initial encounter: Secondary | ICD-10-CM | POA: Diagnosis not present

## 2013-12-26 ENCOUNTER — Other Ambulatory Visit: Payer: Self-pay | Admitting: Orthopedic Surgery

## 2013-12-26 DIAGNOSIS — S7001XA Contusion of right hip, initial encounter: Secondary | ICD-10-CM

## 2013-12-27 ENCOUNTER — Ambulatory Visit
Admission: RE | Admit: 2013-12-27 | Discharge: 2013-12-27 | Disposition: A | Discharge status: Home / Self Care | Payer: Medicare Other | Source: Ambulatory Visit | Attending: Orthopedic Surgery | Admitting: Orthopedic Surgery

## 2013-12-27 DIAGNOSIS — S7000XA Contusion of unspecified hip, initial encounter: Secondary | ICD-10-CM | POA: Diagnosis not present

## 2013-12-27 DIAGNOSIS — S7001XA Contusion of right hip, initial encounter: Secondary | ICD-10-CM

## 2013-12-31 DIAGNOSIS — Z950 Presence of cardiac pacemaker: Secondary | ICD-10-CM | POA: Diagnosis not present

## 2013-12-31 DIAGNOSIS — E039 Hypothyroidism, unspecified: Secondary | ICD-10-CM | POA: Diagnosis not present

## 2013-12-31 DIAGNOSIS — I482 Chronic atrial fibrillation: Secondary | ICD-10-CM | POA: Diagnosis not present

## 2013-12-31 DIAGNOSIS — I872 Venous insufficiency (chronic) (peripheral): Secondary | ICD-10-CM | POA: Diagnosis not present

## 2013-12-31 DIAGNOSIS — R011 Cardiac murmur, unspecified: Secondary | ICD-10-CM | POA: Diagnosis not present

## 2013-12-31 DIAGNOSIS — E785 Hyperlipidemia, unspecified: Secondary | ICD-10-CM | POA: Diagnosis not present

## 2013-12-31 DIAGNOSIS — Z7901 Long term (current) use of anticoagulants: Secondary | ICD-10-CM | POA: Diagnosis not present

## 2014-01-01 DIAGNOSIS — S7001XD Contusion of right hip, subsequent encounter: Secondary | ICD-10-CM | POA: Diagnosis not present

## 2014-01-11 DIAGNOSIS — Z961 Presence of intraocular lens: Secondary | ICD-10-CM | POA: Diagnosis not present

## 2014-01-11 DIAGNOSIS — H5310 Unspecified subjective visual disturbances: Secondary | ICD-10-CM | POA: Diagnosis not present

## 2014-01-21 DIAGNOSIS — R011 Cardiac murmur, unspecified: Secondary | ICD-10-CM | POA: Diagnosis not present

## 2014-01-21 DIAGNOSIS — E039 Hypothyroidism, unspecified: Secondary | ICD-10-CM | POA: Diagnosis not present

## 2014-01-21 DIAGNOSIS — Z950 Presence of cardiac pacemaker: Secondary | ICD-10-CM | POA: Diagnosis not present

## 2014-01-21 DIAGNOSIS — Z7901 Long term (current) use of anticoagulants: Secondary | ICD-10-CM | POA: Diagnosis not present

## 2014-01-21 DIAGNOSIS — E785 Hyperlipidemia, unspecified: Secondary | ICD-10-CM | POA: Diagnosis not present

## 2014-01-21 DIAGNOSIS — I872 Venous insufficiency (chronic) (peripheral): Secondary | ICD-10-CM | POA: Diagnosis not present

## 2014-01-21 DIAGNOSIS — I482 Chronic atrial fibrillation: Secondary | ICD-10-CM | POA: Diagnosis not present

## 2014-01-28 DIAGNOSIS — R040 Epistaxis: Secondary | ICD-10-CM | POA: Diagnosis not present

## 2014-02-19 DIAGNOSIS — R011 Cardiac murmur, unspecified: Secondary | ICD-10-CM | POA: Diagnosis not present

## 2014-02-19 DIAGNOSIS — Z7901 Long term (current) use of anticoagulants: Secondary | ICD-10-CM | POA: Diagnosis not present

## 2014-02-19 DIAGNOSIS — Z950 Presence of cardiac pacemaker: Secondary | ICD-10-CM | POA: Diagnosis not present

## 2014-02-19 DIAGNOSIS — I872 Venous insufficiency (chronic) (peripheral): Secondary | ICD-10-CM | POA: Diagnosis not present

## 2014-02-19 DIAGNOSIS — E039 Hypothyroidism, unspecified: Secondary | ICD-10-CM | POA: Diagnosis not present

## 2014-02-19 DIAGNOSIS — I482 Chronic atrial fibrillation: Secondary | ICD-10-CM | POA: Diagnosis not present

## 2014-02-19 DIAGNOSIS — E785 Hyperlipidemia, unspecified: Secondary | ICD-10-CM | POA: Diagnosis not present

## 2014-03-19 DIAGNOSIS — Z950 Presence of cardiac pacemaker: Secondary | ICD-10-CM | POA: Diagnosis not present

## 2014-03-19 DIAGNOSIS — I872 Venous insufficiency (chronic) (peripheral): Secondary | ICD-10-CM | POA: Diagnosis not present

## 2014-03-19 DIAGNOSIS — Z7901 Long term (current) use of anticoagulants: Secondary | ICD-10-CM | POA: Diagnosis not present

## 2014-03-19 DIAGNOSIS — R011 Cardiac murmur, unspecified: Secondary | ICD-10-CM | POA: Diagnosis not present

## 2014-03-19 DIAGNOSIS — E785 Hyperlipidemia, unspecified: Secondary | ICD-10-CM | POA: Diagnosis not present

## 2014-03-19 DIAGNOSIS — I482 Chronic atrial fibrillation: Secondary | ICD-10-CM | POA: Diagnosis not present

## 2014-03-19 DIAGNOSIS — E039 Hypothyroidism, unspecified: Secondary | ICD-10-CM | POA: Diagnosis not present

## 2014-04-26 DIAGNOSIS — E785 Hyperlipidemia, unspecified: Secondary | ICD-10-CM | POA: Diagnosis not present

## 2014-04-26 DIAGNOSIS — R011 Cardiac murmur, unspecified: Secondary | ICD-10-CM | POA: Diagnosis not present

## 2014-04-26 DIAGNOSIS — Z7901 Long term (current) use of anticoagulants: Secondary | ICD-10-CM | POA: Diagnosis not present

## 2014-04-26 DIAGNOSIS — Z950 Presence of cardiac pacemaker: Secondary | ICD-10-CM | POA: Diagnosis not present

## 2014-04-26 DIAGNOSIS — I482 Chronic atrial fibrillation: Secondary | ICD-10-CM | POA: Diagnosis not present

## 2014-04-26 DIAGNOSIS — I872 Venous insufficiency (chronic) (peripheral): Secondary | ICD-10-CM | POA: Diagnosis not present

## 2014-04-26 DIAGNOSIS — E039 Hypothyroidism, unspecified: Secondary | ICD-10-CM | POA: Diagnosis not present

## 2014-05-15 DIAGNOSIS — T8529XA Other mechanical complication of intraocular lens, initial encounter: Secondary | ICD-10-CM | POA: Insufficient documentation

## 2014-05-15 DIAGNOSIS — H524 Presbyopia: Secondary | ICD-10-CM

## 2014-05-15 DIAGNOSIS — H26492 Other secondary cataract, left eye: Secondary | ICD-10-CM | POA: Insufficient documentation

## 2014-05-15 DIAGNOSIS — H5203 Hypermetropia, bilateral: Secondary | ICD-10-CM | POA: Insufficient documentation

## 2014-05-15 DIAGNOSIS — H43393 Other vitreous opacities, bilateral: Secondary | ICD-10-CM | POA: Insufficient documentation

## 2014-05-15 DIAGNOSIS — H52203 Unspecified astigmatism, bilateral: Secondary | ICD-10-CM

## 2014-05-15 DIAGNOSIS — H04123 Dry eye syndrome of bilateral lacrimal glands: Secondary | ICD-10-CM | POA: Insufficient documentation

## 2014-05-15 DIAGNOSIS — H52211 Irregular astigmatism, right eye: Secondary | ICD-10-CM | POA: Insufficient documentation

## 2014-05-15 DIAGNOSIS — H02839 Dermatochalasis of unspecified eye, unspecified eyelid: Secondary | ICD-10-CM | POA: Insufficient documentation

## 2014-05-15 DIAGNOSIS — H02403 Unspecified ptosis of bilateral eyelids: Secondary | ICD-10-CM | POA: Insufficient documentation

## 2014-05-15 DIAGNOSIS — H5315 Visual distortions of shape and size: Secondary | ICD-10-CM | POA: Insufficient documentation

## 2014-05-16 DIAGNOSIS — H04123 Dry eye syndrome of bilateral lacrimal glands: Secondary | ICD-10-CM | POA: Diagnosis not present

## 2014-05-16 DIAGNOSIS — H5203 Hypermetropia, bilateral: Secondary | ICD-10-CM | POA: Diagnosis not present

## 2014-05-16 DIAGNOSIS — H02839 Dermatochalasis of unspecified eye, unspecified eyelid: Secondary | ICD-10-CM | POA: Diagnosis not present

## 2014-05-16 DIAGNOSIS — T8529XA Other mechanical complication of intraocular lens, initial encounter: Secondary | ICD-10-CM | POA: Diagnosis not present

## 2014-05-16 DIAGNOSIS — H5315 Visual distortions of shape and size: Secondary | ICD-10-CM | POA: Diagnosis not present

## 2014-05-16 DIAGNOSIS — H43393 Other vitreous opacities, bilateral: Secondary | ICD-10-CM | POA: Diagnosis not present

## 2014-05-16 DIAGNOSIS — H52211 Irregular astigmatism, right eye: Secondary | ICD-10-CM | POA: Diagnosis not present

## 2014-05-16 DIAGNOSIS — H02403 Unspecified ptosis of bilateral eyelids: Secondary | ICD-10-CM | POA: Diagnosis not present

## 2014-05-16 DIAGNOSIS — H52203 Unspecified astigmatism, bilateral: Secondary | ICD-10-CM | POA: Diagnosis not present

## 2014-05-16 DIAGNOSIS — H26492 Other secondary cataract, left eye: Secondary | ICD-10-CM | POA: Diagnosis not present

## 2014-05-17 DIAGNOSIS — G5602 Carpal tunnel syndrome, left upper limb: Secondary | ICD-10-CM | POA: Diagnosis not present

## 2014-05-17 DIAGNOSIS — G5601 Carpal tunnel syndrome, right upper limb: Secondary | ICD-10-CM | POA: Diagnosis not present

## 2014-06-19 DIAGNOSIS — R011 Cardiac murmur, unspecified: Secondary | ICD-10-CM | POA: Diagnosis not present

## 2014-06-19 DIAGNOSIS — Z950 Presence of cardiac pacemaker: Secondary | ICD-10-CM | POA: Diagnosis not present

## 2014-06-19 DIAGNOSIS — I482 Chronic atrial fibrillation: Secondary | ICD-10-CM | POA: Diagnosis not present

## 2014-06-19 DIAGNOSIS — Z7901 Long term (current) use of anticoagulants: Secondary | ICD-10-CM | POA: Diagnosis not present

## 2014-06-19 DIAGNOSIS — E785 Hyperlipidemia, unspecified: Secondary | ICD-10-CM | POA: Diagnosis not present

## 2014-06-19 DIAGNOSIS — E039 Hypothyroidism, unspecified: Secondary | ICD-10-CM | POA: Diagnosis not present

## 2014-06-19 DIAGNOSIS — I872 Venous insufficiency (chronic) (peripheral): Secondary | ICD-10-CM | POA: Diagnosis not present

## 2014-06-21 DIAGNOSIS — I482 Chronic atrial fibrillation: Secondary | ICD-10-CM | POA: Diagnosis not present

## 2014-06-21 DIAGNOSIS — I872 Venous insufficiency (chronic) (peripheral): Secondary | ICD-10-CM | POA: Diagnosis not present

## 2014-06-21 DIAGNOSIS — E039 Hypothyroidism, unspecified: Secondary | ICD-10-CM | POA: Diagnosis not present

## 2014-06-21 DIAGNOSIS — R011 Cardiac murmur, unspecified: Secondary | ICD-10-CM | POA: Diagnosis not present

## 2014-06-21 DIAGNOSIS — Z7901 Long term (current) use of anticoagulants: Secondary | ICD-10-CM | POA: Diagnosis not present

## 2014-06-21 DIAGNOSIS — E785 Hyperlipidemia, unspecified: Secondary | ICD-10-CM | POA: Diagnosis not present

## 2014-06-21 DIAGNOSIS — Z950 Presence of cardiac pacemaker: Secondary | ICD-10-CM | POA: Diagnosis not present

## 2014-06-26 DIAGNOSIS — E039 Hypothyroidism, unspecified: Secondary | ICD-10-CM | POA: Diagnosis not present

## 2014-06-26 DIAGNOSIS — Z1212 Encounter for screening for malignant neoplasm of rectum: Secondary | ICD-10-CM | POA: Diagnosis not present

## 2014-06-26 DIAGNOSIS — E785 Hyperlipidemia, unspecified: Secondary | ICD-10-CM | POA: Diagnosis not present

## 2014-06-26 DIAGNOSIS — I480 Paroxysmal atrial fibrillation: Secondary | ICD-10-CM | POA: Diagnosis not present

## 2014-06-26 DIAGNOSIS — Z79899 Other long term (current) drug therapy: Secondary | ICD-10-CM | POA: Diagnosis not present

## 2014-07-03 DIAGNOSIS — M1712 Unilateral primary osteoarthritis, left knee: Secondary | ICD-10-CM | POA: Diagnosis not present

## 2014-07-03 DIAGNOSIS — I480 Paroxysmal atrial fibrillation: Secondary | ICD-10-CM | POA: Diagnosis not present

## 2014-07-03 DIAGNOSIS — Z1389 Encounter for screening for other disorder: Secondary | ICD-10-CM | POA: Diagnosis not present

## 2014-07-03 DIAGNOSIS — Z0000 Encounter for general adult medical examination without abnormal findings: Secondary | ICD-10-CM | POA: Diagnosis not present

## 2014-07-03 DIAGNOSIS — Z23 Encounter for immunization: Secondary | ICD-10-CM | POA: Diagnosis not present

## 2014-07-03 DIAGNOSIS — G629 Polyneuropathy, unspecified: Secondary | ICD-10-CM | POA: Diagnosis not present

## 2014-07-03 DIAGNOSIS — E039 Hypothyroidism, unspecified: Secondary | ICD-10-CM | POA: Diagnosis not present

## 2014-07-03 DIAGNOSIS — F329 Major depressive disorder, single episode, unspecified: Secondary | ICD-10-CM | POA: Diagnosis not present

## 2014-07-03 DIAGNOSIS — E785 Hyperlipidemia, unspecified: Secondary | ICD-10-CM | POA: Diagnosis not present

## 2014-07-19 DIAGNOSIS — E039 Hypothyroidism, unspecified: Secondary | ICD-10-CM | POA: Diagnosis not present

## 2014-07-19 DIAGNOSIS — I872 Venous insufficiency (chronic) (peripheral): Secondary | ICD-10-CM | POA: Diagnosis not present

## 2014-07-19 DIAGNOSIS — E663 Overweight: Secondary | ICD-10-CM | POA: Diagnosis not present

## 2014-07-19 DIAGNOSIS — Z7901 Long term (current) use of anticoagulants: Secondary | ICD-10-CM | POA: Diagnosis not present

## 2014-07-19 DIAGNOSIS — I482 Chronic atrial fibrillation: Secondary | ICD-10-CM | POA: Diagnosis not present

## 2014-07-19 DIAGNOSIS — Z950 Presence of cardiac pacemaker: Secondary | ICD-10-CM | POA: Diagnosis not present

## 2014-07-19 DIAGNOSIS — R011 Cardiac murmur, unspecified: Secondary | ICD-10-CM | POA: Diagnosis not present

## 2014-07-19 DIAGNOSIS — E785 Hyperlipidemia, unspecified: Secondary | ICD-10-CM | POA: Diagnosis not present

## 2014-08-08 DIAGNOSIS — G5602 Carpal tunnel syndrome, left upper limb: Secondary | ICD-10-CM | POA: Diagnosis not present

## 2014-08-08 DIAGNOSIS — G5601 Carpal tunnel syndrome, right upper limb: Secondary | ICD-10-CM | POA: Diagnosis not present

## 2014-08-16 DIAGNOSIS — Z950 Presence of cardiac pacemaker: Secondary | ICD-10-CM | POA: Diagnosis not present

## 2014-08-16 DIAGNOSIS — I482 Chronic atrial fibrillation: Secondary | ICD-10-CM | POA: Diagnosis not present

## 2014-08-16 DIAGNOSIS — Z7901 Long term (current) use of anticoagulants: Secondary | ICD-10-CM | POA: Diagnosis not present

## 2014-08-16 DIAGNOSIS — I872 Venous insufficiency (chronic) (peripheral): Secondary | ICD-10-CM | POA: Diagnosis not present

## 2014-08-16 DIAGNOSIS — R011 Cardiac murmur, unspecified: Secondary | ICD-10-CM | POA: Diagnosis not present

## 2014-08-16 DIAGNOSIS — E039 Hypothyroidism, unspecified: Secondary | ICD-10-CM | POA: Diagnosis not present

## 2014-08-16 DIAGNOSIS — E785 Hyperlipidemia, unspecified: Secondary | ICD-10-CM | POA: Diagnosis not present

## 2014-08-16 DIAGNOSIS — E663 Overweight: Secondary | ICD-10-CM | POA: Diagnosis not present

## 2014-09-10 DIAGNOSIS — L0889 Other specified local infections of the skin and subcutaneous tissue: Secondary | ICD-10-CM | POA: Diagnosis not present

## 2014-09-10 DIAGNOSIS — Z6827 Body mass index (BMI) 27.0-27.9, adult: Secondary | ICD-10-CM | POA: Diagnosis not present

## 2014-09-12 DIAGNOSIS — G5601 Carpal tunnel syndrome, right upper limb: Secondary | ICD-10-CM | POA: Diagnosis not present

## 2014-09-12 DIAGNOSIS — G5602 Carpal tunnel syndrome, left upper limb: Secondary | ICD-10-CM | POA: Diagnosis not present

## 2014-09-13 DIAGNOSIS — Z950 Presence of cardiac pacemaker: Secondary | ICD-10-CM | POA: Diagnosis not present

## 2014-09-13 DIAGNOSIS — I482 Chronic atrial fibrillation: Secondary | ICD-10-CM | POA: Diagnosis not present

## 2014-09-13 DIAGNOSIS — I872 Venous insufficiency (chronic) (peripheral): Secondary | ICD-10-CM | POA: Diagnosis not present

## 2014-09-13 DIAGNOSIS — E663 Overweight: Secondary | ICD-10-CM | POA: Diagnosis not present

## 2014-09-13 DIAGNOSIS — Z7901 Long term (current) use of anticoagulants: Secondary | ICD-10-CM | POA: Diagnosis not present

## 2014-09-13 DIAGNOSIS — E039 Hypothyroidism, unspecified: Secondary | ICD-10-CM | POA: Diagnosis not present

## 2014-09-13 DIAGNOSIS — R011 Cardiac murmur, unspecified: Secondary | ICD-10-CM | POA: Diagnosis not present

## 2014-09-13 DIAGNOSIS — E785 Hyperlipidemia, unspecified: Secondary | ICD-10-CM | POA: Diagnosis not present

## 2014-09-18 DIAGNOSIS — I482 Chronic atrial fibrillation: Secondary | ICD-10-CM | POA: Diagnosis not present

## 2014-09-18 DIAGNOSIS — I872 Venous insufficiency (chronic) (peripheral): Secondary | ICD-10-CM | POA: Diagnosis not present

## 2014-09-18 DIAGNOSIS — R011 Cardiac murmur, unspecified: Secondary | ICD-10-CM | POA: Diagnosis not present

## 2014-09-18 DIAGNOSIS — E663 Overweight: Secondary | ICD-10-CM | POA: Diagnosis not present

## 2014-09-18 DIAGNOSIS — Z7901 Long term (current) use of anticoagulants: Secondary | ICD-10-CM | POA: Diagnosis not present

## 2014-09-18 DIAGNOSIS — Z950 Presence of cardiac pacemaker: Secondary | ICD-10-CM | POA: Diagnosis not present

## 2014-09-18 DIAGNOSIS — E039 Hypothyroidism, unspecified: Secondary | ICD-10-CM | POA: Diagnosis not present

## 2014-09-18 DIAGNOSIS — E785 Hyperlipidemia, unspecified: Secondary | ICD-10-CM | POA: Diagnosis not present

## 2014-10-24 DIAGNOSIS — G5601 Carpal tunnel syndrome, right upper limb: Secondary | ICD-10-CM | POA: Diagnosis not present

## 2014-10-24 DIAGNOSIS — G5602 Carpal tunnel syndrome, left upper limb: Secondary | ICD-10-CM | POA: Diagnosis not present

## 2014-11-15 DIAGNOSIS — I872 Venous insufficiency (chronic) (peripheral): Secondary | ICD-10-CM | POA: Diagnosis not present

## 2014-11-15 DIAGNOSIS — Z7901 Long term (current) use of anticoagulants: Secondary | ICD-10-CM | POA: Diagnosis not present

## 2014-11-15 DIAGNOSIS — E785 Hyperlipidemia, unspecified: Secondary | ICD-10-CM | POA: Diagnosis not present

## 2014-11-15 DIAGNOSIS — R011 Cardiac murmur, unspecified: Secondary | ICD-10-CM | POA: Diagnosis not present

## 2014-11-15 DIAGNOSIS — E663 Overweight: Secondary | ICD-10-CM | POA: Diagnosis not present

## 2014-11-15 DIAGNOSIS — Z950 Presence of cardiac pacemaker: Secondary | ICD-10-CM | POA: Diagnosis not present

## 2014-11-15 DIAGNOSIS — I482 Chronic atrial fibrillation: Secondary | ICD-10-CM | POA: Diagnosis not present

## 2014-11-15 DIAGNOSIS — E039 Hypothyroidism, unspecified: Secondary | ICD-10-CM | POA: Diagnosis not present

## 2014-11-29 DIAGNOSIS — E663 Overweight: Secondary | ICD-10-CM | POA: Diagnosis not present

## 2014-11-29 DIAGNOSIS — R011 Cardiac murmur, unspecified: Secondary | ICD-10-CM | POA: Diagnosis not present

## 2014-11-29 DIAGNOSIS — E785 Hyperlipidemia, unspecified: Secondary | ICD-10-CM | POA: Diagnosis not present

## 2014-11-29 DIAGNOSIS — I482 Chronic atrial fibrillation: Secondary | ICD-10-CM | POA: Diagnosis not present

## 2014-11-29 DIAGNOSIS — E039 Hypothyroidism, unspecified: Secondary | ICD-10-CM | POA: Diagnosis not present

## 2014-11-29 DIAGNOSIS — Z7901 Long term (current) use of anticoagulants: Secondary | ICD-10-CM | POA: Diagnosis not present

## 2014-11-29 DIAGNOSIS — Z950 Presence of cardiac pacemaker: Secondary | ICD-10-CM | POA: Diagnosis not present

## 2014-11-29 DIAGNOSIS — I872 Venous insufficiency (chronic) (peripheral): Secondary | ICD-10-CM | POA: Diagnosis not present

## 2014-12-19 DIAGNOSIS — Z1231 Encounter for screening mammogram for malignant neoplasm of breast: Secondary | ICD-10-CM | POA: Diagnosis not present

## 2014-12-20 DIAGNOSIS — J3089 Other allergic rhinitis: Secondary | ICD-10-CM | POA: Diagnosis not present

## 2014-12-20 DIAGNOSIS — L509 Urticaria, unspecified: Secondary | ICD-10-CM | POA: Diagnosis not present

## 2014-12-20 DIAGNOSIS — H1045 Other chronic allergic conjunctivitis: Secondary | ICD-10-CM | POA: Diagnosis not present

## 2015-01-01 DIAGNOSIS — E785 Hyperlipidemia, unspecified: Secondary | ICD-10-CM | POA: Diagnosis not present

## 2015-01-01 DIAGNOSIS — I482 Chronic atrial fibrillation: Secondary | ICD-10-CM | POA: Diagnosis not present

## 2015-01-01 DIAGNOSIS — E663 Overweight: Secondary | ICD-10-CM | POA: Diagnosis not present

## 2015-01-01 DIAGNOSIS — Z950 Presence of cardiac pacemaker: Secondary | ICD-10-CM | POA: Diagnosis not present

## 2015-01-01 DIAGNOSIS — Z7901 Long term (current) use of anticoagulants: Secondary | ICD-10-CM | POA: Diagnosis not present

## 2015-01-01 DIAGNOSIS — E039 Hypothyroidism, unspecified: Secondary | ICD-10-CM | POA: Diagnosis not present

## 2015-01-01 DIAGNOSIS — R011 Cardiac murmur, unspecified: Secondary | ICD-10-CM | POA: Diagnosis not present

## 2015-01-01 DIAGNOSIS — I872 Venous insufficiency (chronic) (peripheral): Secondary | ICD-10-CM | POA: Diagnosis not present

## 2015-01-03 DIAGNOSIS — Z7901 Long term (current) use of anticoagulants: Secondary | ICD-10-CM | POA: Diagnosis not present

## 2015-01-03 DIAGNOSIS — E663 Overweight: Secondary | ICD-10-CM | POA: Diagnosis not present

## 2015-01-03 DIAGNOSIS — I482 Chronic atrial fibrillation: Secondary | ICD-10-CM | POA: Diagnosis not present

## 2015-01-03 DIAGNOSIS — E039 Hypothyroidism, unspecified: Secondary | ICD-10-CM | POA: Diagnosis not present

## 2015-01-03 DIAGNOSIS — R011 Cardiac murmur, unspecified: Secondary | ICD-10-CM | POA: Diagnosis not present

## 2015-01-03 DIAGNOSIS — E785 Hyperlipidemia, unspecified: Secondary | ICD-10-CM | POA: Diagnosis not present

## 2015-01-03 DIAGNOSIS — Z950 Presence of cardiac pacemaker: Secondary | ICD-10-CM | POA: Diagnosis not present

## 2015-01-03 DIAGNOSIS — I872 Venous insufficiency (chronic) (peripheral): Secondary | ICD-10-CM | POA: Diagnosis not present

## 2015-01-06 DIAGNOSIS — E039 Hypothyroidism, unspecified: Secondary | ICD-10-CM | POA: Diagnosis not present

## 2015-01-06 DIAGNOSIS — M1712 Unilateral primary osteoarthritis, left knee: Secondary | ICD-10-CM | POA: Diagnosis not present

## 2015-01-06 DIAGNOSIS — E785 Hyperlipidemia, unspecified: Secondary | ICD-10-CM | POA: Diagnosis not present

## 2015-01-06 DIAGNOSIS — D692 Other nonthrombocytopenic purpura: Secondary | ICD-10-CM | POA: Diagnosis not present

## 2015-01-06 DIAGNOSIS — I480 Paroxysmal atrial fibrillation: Secondary | ICD-10-CM | POA: Diagnosis not present

## 2015-01-06 DIAGNOSIS — Z23 Encounter for immunization: Secondary | ICD-10-CM | POA: Diagnosis not present

## 2015-01-06 DIAGNOSIS — G629 Polyneuropathy, unspecified: Secondary | ICD-10-CM | POA: Diagnosis not present

## 2015-01-06 DIAGNOSIS — Z6827 Body mass index (BMI) 27.0-27.9, adult: Secondary | ICD-10-CM | POA: Diagnosis not present

## 2015-01-06 DIAGNOSIS — F329 Major depressive disorder, single episode, unspecified: Secondary | ICD-10-CM | POA: Diagnosis not present

## 2015-01-06 DIAGNOSIS — Z1389 Encounter for screening for other disorder: Secondary | ICD-10-CM | POA: Diagnosis not present

## 2015-01-17 DIAGNOSIS — R011 Cardiac murmur, unspecified: Secondary | ICD-10-CM | POA: Diagnosis not present

## 2015-01-17 DIAGNOSIS — Z7901 Long term (current) use of anticoagulants: Secondary | ICD-10-CM | POA: Diagnosis not present

## 2015-01-17 DIAGNOSIS — I872 Venous insufficiency (chronic) (peripheral): Secondary | ICD-10-CM | POA: Diagnosis not present

## 2015-01-17 DIAGNOSIS — E785 Hyperlipidemia, unspecified: Secondary | ICD-10-CM | POA: Diagnosis not present

## 2015-01-17 DIAGNOSIS — I482 Chronic atrial fibrillation: Secondary | ICD-10-CM | POA: Diagnosis not present

## 2015-01-17 DIAGNOSIS — E663 Overweight: Secondary | ICD-10-CM | POA: Diagnosis not present

## 2015-01-17 DIAGNOSIS — Z950 Presence of cardiac pacemaker: Secondary | ICD-10-CM | POA: Diagnosis not present

## 2015-01-17 DIAGNOSIS — E039 Hypothyroidism, unspecified: Secondary | ICD-10-CM | POA: Diagnosis not present

## 2015-02-21 DIAGNOSIS — I340 Nonrheumatic mitral (valve) insufficiency: Secondary | ICD-10-CM | POA: Diagnosis not present

## 2015-02-21 DIAGNOSIS — Z7901 Long term (current) use of anticoagulants: Secondary | ICD-10-CM | POA: Diagnosis not present

## 2015-02-21 DIAGNOSIS — Z950 Presence of cardiac pacemaker: Secondary | ICD-10-CM | POA: Diagnosis not present

## 2015-02-21 DIAGNOSIS — E785 Hyperlipidemia, unspecified: Secondary | ICD-10-CM | POA: Diagnosis not present

## 2015-02-21 DIAGNOSIS — I872 Venous insufficiency (chronic) (peripheral): Secondary | ICD-10-CM | POA: Diagnosis not present

## 2015-02-21 DIAGNOSIS — E039 Hypothyroidism, unspecified: Secondary | ICD-10-CM | POA: Diagnosis not present

## 2015-02-21 DIAGNOSIS — E663 Overweight: Secondary | ICD-10-CM | POA: Diagnosis not present

## 2015-02-21 DIAGNOSIS — I482 Chronic atrial fibrillation: Secondary | ICD-10-CM | POA: Diagnosis not present

## 2015-03-21 DIAGNOSIS — Z7901 Long term (current) use of anticoagulants: Secondary | ICD-10-CM | POA: Diagnosis not present

## 2015-04-02 DIAGNOSIS — E039 Hypothyroidism, unspecified: Secondary | ICD-10-CM | POA: Diagnosis not present

## 2015-04-02 DIAGNOSIS — I482 Chronic atrial fibrillation: Secondary | ICD-10-CM | POA: Diagnosis not present

## 2015-04-02 DIAGNOSIS — Z950 Presence of cardiac pacemaker: Secondary | ICD-10-CM | POA: Diagnosis not present

## 2015-04-02 DIAGNOSIS — I340 Nonrheumatic mitral (valve) insufficiency: Secondary | ICD-10-CM | POA: Diagnosis not present

## 2015-04-02 DIAGNOSIS — E663 Overweight: Secondary | ICD-10-CM | POA: Diagnosis not present

## 2015-04-02 DIAGNOSIS — Z7901 Long term (current) use of anticoagulants: Secondary | ICD-10-CM | POA: Diagnosis not present

## 2015-04-02 DIAGNOSIS — E785 Hyperlipidemia, unspecified: Secondary | ICD-10-CM | POA: Diagnosis not present

## 2015-04-02 DIAGNOSIS — I872 Venous insufficiency (chronic) (peripheral): Secondary | ICD-10-CM | POA: Diagnosis not present

## 2015-04-18 DIAGNOSIS — I482 Chronic atrial fibrillation: Secondary | ICD-10-CM | POA: Diagnosis not present

## 2015-04-18 DIAGNOSIS — E039 Hypothyroidism, unspecified: Secondary | ICD-10-CM | POA: Diagnosis not present

## 2015-04-18 DIAGNOSIS — I340 Nonrheumatic mitral (valve) insufficiency: Secondary | ICD-10-CM | POA: Diagnosis not present

## 2015-04-18 DIAGNOSIS — E785 Hyperlipidemia, unspecified: Secondary | ICD-10-CM | POA: Diagnosis not present

## 2015-04-18 DIAGNOSIS — E663 Overweight: Secondary | ICD-10-CM | POA: Diagnosis not present

## 2015-04-18 DIAGNOSIS — Z950 Presence of cardiac pacemaker: Secondary | ICD-10-CM | POA: Diagnosis not present

## 2015-04-18 DIAGNOSIS — Z7901 Long term (current) use of anticoagulants: Secondary | ICD-10-CM | POA: Diagnosis not present

## 2015-04-18 DIAGNOSIS — I872 Venous insufficiency (chronic) (peripheral): Secondary | ICD-10-CM | POA: Diagnosis not present

## 2015-05-16 DIAGNOSIS — I340 Nonrheumatic mitral (valve) insufficiency: Secondary | ICD-10-CM | POA: Diagnosis not present

## 2015-05-16 DIAGNOSIS — I872 Venous insufficiency (chronic) (peripheral): Secondary | ICD-10-CM | POA: Diagnosis not present

## 2015-05-16 DIAGNOSIS — E785 Hyperlipidemia, unspecified: Secondary | ICD-10-CM | POA: Diagnosis not present

## 2015-05-16 DIAGNOSIS — Z7901 Long term (current) use of anticoagulants: Secondary | ICD-10-CM | POA: Diagnosis not present

## 2015-05-16 DIAGNOSIS — Z950 Presence of cardiac pacemaker: Secondary | ICD-10-CM | POA: Diagnosis not present

## 2015-05-16 DIAGNOSIS — I482 Chronic atrial fibrillation: Secondary | ICD-10-CM | POA: Diagnosis not present

## 2015-05-16 DIAGNOSIS — E663 Overweight: Secondary | ICD-10-CM | POA: Diagnosis not present

## 2015-05-16 DIAGNOSIS — E039 Hypothyroidism, unspecified: Secondary | ICD-10-CM | POA: Diagnosis not present

## 2015-05-23 DIAGNOSIS — Z961 Presence of intraocular lens: Secondary | ICD-10-CM | POA: Diagnosis not present

## 2015-05-30 DIAGNOSIS — Z950 Presence of cardiac pacemaker: Secondary | ICD-10-CM | POA: Diagnosis not present

## 2015-05-30 DIAGNOSIS — I482 Chronic atrial fibrillation: Secondary | ICD-10-CM | POA: Diagnosis not present

## 2015-05-30 DIAGNOSIS — I340 Nonrheumatic mitral (valve) insufficiency: Secondary | ICD-10-CM | POA: Diagnosis not present

## 2015-05-30 DIAGNOSIS — E039 Hypothyroidism, unspecified: Secondary | ICD-10-CM | POA: Diagnosis not present

## 2015-05-30 DIAGNOSIS — E785 Hyperlipidemia, unspecified: Secondary | ICD-10-CM | POA: Diagnosis not present

## 2015-05-30 DIAGNOSIS — I872 Venous insufficiency (chronic) (peripheral): Secondary | ICD-10-CM | POA: Diagnosis not present

## 2015-05-30 DIAGNOSIS — E663 Overweight: Secondary | ICD-10-CM | POA: Diagnosis not present

## 2015-05-30 DIAGNOSIS — Z7901 Long term (current) use of anticoagulants: Secondary | ICD-10-CM | POA: Diagnosis not present

## 2015-06-13 DIAGNOSIS — G5603 Carpal tunnel syndrome, bilateral upper limbs: Secondary | ICD-10-CM | POA: Diagnosis not present

## 2015-06-13 DIAGNOSIS — G5602 Carpal tunnel syndrome, left upper limb: Secondary | ICD-10-CM | POA: Diagnosis not present

## 2015-06-13 DIAGNOSIS — G5601 Carpal tunnel syndrome, right upper limb: Secondary | ICD-10-CM | POA: Diagnosis not present

## 2015-07-04 DIAGNOSIS — I482 Chronic atrial fibrillation: Secondary | ICD-10-CM | POA: Diagnosis not present

## 2015-07-04 DIAGNOSIS — E663 Overweight: Secondary | ICD-10-CM | POA: Diagnosis not present

## 2015-07-04 DIAGNOSIS — Z7901 Long term (current) use of anticoagulants: Secondary | ICD-10-CM | POA: Diagnosis not present

## 2015-07-04 DIAGNOSIS — I872 Venous insufficiency (chronic) (peripheral): Secondary | ICD-10-CM | POA: Diagnosis not present

## 2015-07-04 DIAGNOSIS — E785 Hyperlipidemia, unspecified: Secondary | ICD-10-CM | POA: Diagnosis not present

## 2015-07-04 DIAGNOSIS — E039 Hypothyroidism, unspecified: Secondary | ICD-10-CM | POA: Diagnosis not present

## 2015-07-04 DIAGNOSIS — Z950 Presence of cardiac pacemaker: Secondary | ICD-10-CM | POA: Diagnosis not present

## 2015-07-04 DIAGNOSIS — I340 Nonrheumatic mitral (valve) insufficiency: Secondary | ICD-10-CM | POA: Diagnosis not present

## 2015-07-11 DIAGNOSIS — E663 Overweight: Secondary | ICD-10-CM | POA: Diagnosis not present

## 2015-07-11 DIAGNOSIS — E785 Hyperlipidemia, unspecified: Secondary | ICD-10-CM | POA: Diagnosis not present

## 2015-07-11 DIAGNOSIS — E038 Other specified hypothyroidism: Secondary | ICD-10-CM | POA: Diagnosis not present

## 2015-07-11 DIAGNOSIS — Z7901 Long term (current) use of anticoagulants: Secondary | ICD-10-CM | POA: Diagnosis not present

## 2015-07-11 DIAGNOSIS — E039 Hypothyroidism, unspecified: Secondary | ICD-10-CM | POA: Diagnosis not present

## 2015-07-11 DIAGNOSIS — Z79899 Other long term (current) drug therapy: Secondary | ICD-10-CM | POA: Diagnosis not present

## 2015-07-11 DIAGNOSIS — I340 Nonrheumatic mitral (valve) insufficiency: Secondary | ICD-10-CM | POA: Diagnosis not present

## 2015-07-11 DIAGNOSIS — E784 Other hyperlipidemia: Secondary | ICD-10-CM | POA: Diagnosis not present

## 2015-07-11 DIAGNOSIS — I872 Venous insufficiency (chronic) (peripheral): Secondary | ICD-10-CM | POA: Diagnosis not present

## 2015-07-11 DIAGNOSIS — I482 Chronic atrial fibrillation: Secondary | ICD-10-CM | POA: Diagnosis not present

## 2015-07-11 DIAGNOSIS — Z950 Presence of cardiac pacemaker: Secondary | ICD-10-CM | POA: Diagnosis not present

## 2015-07-16 DIAGNOSIS — M1712 Unilateral primary osteoarthritis, left knee: Secondary | ICD-10-CM | POA: Diagnosis not present

## 2015-07-16 DIAGNOSIS — I480 Paroxysmal atrial fibrillation: Secondary | ICD-10-CM | POA: Diagnosis not present

## 2015-07-16 DIAGNOSIS — E784 Other hyperlipidemia: Secondary | ICD-10-CM | POA: Diagnosis not present

## 2015-07-16 DIAGNOSIS — Z6827 Body mass index (BMI) 27.0-27.9, adult: Secondary | ICD-10-CM | POA: Diagnosis not present

## 2015-07-16 DIAGNOSIS — Z0000 Encounter for general adult medical examination without abnormal findings: Secondary | ICD-10-CM | POA: Diagnosis not present

## 2015-07-16 DIAGNOSIS — G629 Polyneuropathy, unspecified: Secondary | ICD-10-CM | POA: Diagnosis not present

## 2015-07-16 DIAGNOSIS — D692 Other nonthrombocytopenic purpura: Secondary | ICD-10-CM | POA: Diagnosis not present

## 2015-07-16 DIAGNOSIS — E038 Other specified hypothyroidism: Secondary | ICD-10-CM | POA: Diagnosis not present

## 2015-07-16 DIAGNOSIS — Z1389 Encounter for screening for other disorder: Secondary | ICD-10-CM | POA: Diagnosis not present

## 2015-07-16 DIAGNOSIS — M545 Low back pain: Secondary | ICD-10-CM | POA: Diagnosis not present

## 2015-07-16 DIAGNOSIS — F329 Major depressive disorder, single episode, unspecified: Secondary | ICD-10-CM | POA: Diagnosis not present

## 2015-07-16 DIAGNOSIS — Z950 Presence of cardiac pacemaker: Secondary | ICD-10-CM | POA: Diagnosis not present

## 2015-07-22 DIAGNOSIS — Z1212 Encounter for screening for malignant neoplasm of rectum: Secondary | ICD-10-CM | POA: Diagnosis not present

## 2015-08-08 DIAGNOSIS — I872 Venous insufficiency (chronic) (peripheral): Secondary | ICD-10-CM | POA: Diagnosis not present

## 2015-08-08 DIAGNOSIS — E663 Overweight: Secondary | ICD-10-CM | POA: Diagnosis not present

## 2015-08-08 DIAGNOSIS — I340 Nonrheumatic mitral (valve) insufficiency: Secondary | ICD-10-CM | POA: Diagnosis not present

## 2015-08-08 DIAGNOSIS — E785 Hyperlipidemia, unspecified: Secondary | ICD-10-CM | POA: Diagnosis not present

## 2015-08-08 DIAGNOSIS — I482 Chronic atrial fibrillation: Secondary | ICD-10-CM | POA: Diagnosis not present

## 2015-08-08 DIAGNOSIS — Z7901 Long term (current) use of anticoagulants: Secondary | ICD-10-CM | POA: Diagnosis not present

## 2015-08-08 DIAGNOSIS — E039 Hypothyroidism, unspecified: Secondary | ICD-10-CM | POA: Diagnosis not present

## 2015-08-08 DIAGNOSIS — Z950 Presence of cardiac pacemaker: Secondary | ICD-10-CM | POA: Diagnosis not present

## 2015-09-05 DIAGNOSIS — E663 Overweight: Secondary | ICD-10-CM | POA: Diagnosis not present

## 2015-09-05 DIAGNOSIS — I872 Venous insufficiency (chronic) (peripheral): Secondary | ICD-10-CM | POA: Diagnosis not present

## 2015-09-05 DIAGNOSIS — Z950 Presence of cardiac pacemaker: Secondary | ICD-10-CM | POA: Diagnosis not present

## 2015-09-05 DIAGNOSIS — I482 Chronic atrial fibrillation: Secondary | ICD-10-CM | POA: Diagnosis not present

## 2015-09-05 DIAGNOSIS — Z7901 Long term (current) use of anticoagulants: Secondary | ICD-10-CM | POA: Diagnosis not present

## 2015-09-05 DIAGNOSIS — E039 Hypothyroidism, unspecified: Secondary | ICD-10-CM | POA: Diagnosis not present

## 2015-09-05 DIAGNOSIS — I340 Nonrheumatic mitral (valve) insufficiency: Secondary | ICD-10-CM | POA: Diagnosis not present

## 2015-09-05 DIAGNOSIS — E785 Hyperlipidemia, unspecified: Secondary | ICD-10-CM | POA: Diagnosis not present

## 2015-09-08 DIAGNOSIS — E663 Overweight: Secondary | ICD-10-CM | POA: Diagnosis not present

## 2015-09-08 DIAGNOSIS — Z7901 Long term (current) use of anticoagulants: Secondary | ICD-10-CM | POA: Diagnosis not present

## 2015-09-08 DIAGNOSIS — I340 Nonrheumatic mitral (valve) insufficiency: Secondary | ICD-10-CM | POA: Diagnosis not present

## 2015-09-08 DIAGNOSIS — E785 Hyperlipidemia, unspecified: Secondary | ICD-10-CM | POA: Diagnosis not present

## 2015-09-08 DIAGNOSIS — E039 Hypothyroidism, unspecified: Secondary | ICD-10-CM | POA: Diagnosis not present

## 2015-09-08 DIAGNOSIS — R0602 Shortness of breath: Secondary | ICD-10-CM | POA: Diagnosis not present

## 2015-09-08 DIAGNOSIS — Z950 Presence of cardiac pacemaker: Secondary | ICD-10-CM | POA: Diagnosis not present

## 2015-09-08 DIAGNOSIS — I482 Chronic atrial fibrillation: Secondary | ICD-10-CM | POA: Diagnosis not present

## 2015-09-08 DIAGNOSIS — I872 Venous insufficiency (chronic) (peripheral): Secondary | ICD-10-CM | POA: Diagnosis not present

## 2015-09-15 DIAGNOSIS — R0609 Other forms of dyspnea: Secondary | ICD-10-CM | POA: Diagnosis not present

## 2015-09-15 DIAGNOSIS — Z6825 Body mass index (BMI) 25.0-25.9, adult: Secondary | ICD-10-CM | POA: Diagnosis not present

## 2015-09-15 DIAGNOSIS — R55 Syncope and collapse: Secondary | ICD-10-CM | POA: Diagnosis not present

## 2015-09-15 DIAGNOSIS — E038 Other specified hypothyroidism: Secondary | ICD-10-CM | POA: Diagnosis not present

## 2015-09-15 DIAGNOSIS — I480 Paroxysmal atrial fibrillation: Secondary | ICD-10-CM | POA: Diagnosis not present

## 2015-09-15 DIAGNOSIS — R0602 Shortness of breath: Secondary | ICD-10-CM | POA: Diagnosis not present

## 2015-09-15 DIAGNOSIS — M542 Cervicalgia: Secondary | ICD-10-CM | POA: Diagnosis not present

## 2015-09-22 DIAGNOSIS — R55 Syncope and collapse: Secondary | ICD-10-CM | POA: Insufficient documentation

## 2015-09-22 DIAGNOSIS — R0609 Other forms of dyspnea: Secondary | ICD-10-CM | POA: Insufficient documentation

## 2015-09-24 ENCOUNTER — Encounter (INDEPENDENT_AMBULATORY_CARE_PROVIDER_SITE_OTHER): Payer: Self-pay

## 2015-09-24 ENCOUNTER — Ambulatory Visit (INDEPENDENT_AMBULATORY_CARE_PROVIDER_SITE_OTHER): Payer: Medicare Other

## 2015-09-24 DIAGNOSIS — R55 Syncope and collapse: Secondary | ICD-10-CM | POA: Diagnosis not present

## 2015-09-24 DIAGNOSIS — R0609 Other forms of dyspnea: Secondary | ICD-10-CM | POA: Diagnosis not present

## 2015-09-29 DIAGNOSIS — M25511 Pain in right shoulder: Secondary | ICD-10-CM | POA: Diagnosis not present

## 2015-09-29 DIAGNOSIS — M542 Cervicalgia: Secondary | ICD-10-CM | POA: Diagnosis not present

## 2015-10-02 DIAGNOSIS — E663 Overweight: Secondary | ICD-10-CM | POA: Diagnosis not present

## 2015-10-02 DIAGNOSIS — E785 Hyperlipidemia, unspecified: Secondary | ICD-10-CM | POA: Diagnosis not present

## 2015-10-02 DIAGNOSIS — R0602 Shortness of breath: Secondary | ICD-10-CM | POA: Diagnosis not present

## 2015-10-02 DIAGNOSIS — I340 Nonrheumatic mitral (valve) insufficiency: Secondary | ICD-10-CM | POA: Diagnosis not present

## 2015-10-02 DIAGNOSIS — I482 Chronic atrial fibrillation: Secondary | ICD-10-CM | POA: Diagnosis not present

## 2015-10-02 DIAGNOSIS — Z950 Presence of cardiac pacemaker: Secondary | ICD-10-CM | POA: Diagnosis not present

## 2015-10-02 DIAGNOSIS — Z7901 Long term (current) use of anticoagulants: Secondary | ICD-10-CM | POA: Diagnosis not present

## 2015-10-02 DIAGNOSIS — E039 Hypothyroidism, unspecified: Secondary | ICD-10-CM | POA: Diagnosis not present

## 2015-10-02 DIAGNOSIS — I872 Venous insufficiency (chronic) (peripheral): Secondary | ICD-10-CM | POA: Diagnosis not present

## 2015-10-12 DIAGNOSIS — I872 Venous insufficiency (chronic) (peripheral): Secondary | ICD-10-CM | POA: Diagnosis not present

## 2015-10-12 DIAGNOSIS — Z7901 Long term (current) use of anticoagulants: Secondary | ICD-10-CM | POA: Diagnosis not present

## 2015-10-12 DIAGNOSIS — Z950 Presence of cardiac pacemaker: Secondary | ICD-10-CM | POA: Diagnosis not present

## 2015-10-12 DIAGNOSIS — R0602 Shortness of breath: Secondary | ICD-10-CM | POA: Diagnosis not present

## 2015-10-12 DIAGNOSIS — E039 Hypothyroidism, unspecified: Secondary | ICD-10-CM | POA: Diagnosis not present

## 2015-10-12 DIAGNOSIS — E785 Hyperlipidemia, unspecified: Secondary | ICD-10-CM | POA: Diagnosis not present

## 2015-10-12 DIAGNOSIS — E663 Overweight: Secondary | ICD-10-CM | POA: Diagnosis not present

## 2015-10-12 DIAGNOSIS — I482 Chronic atrial fibrillation: Secondary | ICD-10-CM | POA: Diagnosis not present

## 2015-10-12 DIAGNOSIS — I340 Nonrheumatic mitral (valve) insufficiency: Secondary | ICD-10-CM | POA: Diagnosis not present

## 2015-10-31 DIAGNOSIS — I872 Venous insufficiency (chronic) (peripheral): Secondary | ICD-10-CM | POA: Diagnosis not present

## 2015-10-31 DIAGNOSIS — Z950 Presence of cardiac pacemaker: Secondary | ICD-10-CM | POA: Diagnosis not present

## 2015-10-31 DIAGNOSIS — Z7901 Long term (current) use of anticoagulants: Secondary | ICD-10-CM | POA: Diagnosis not present

## 2015-10-31 DIAGNOSIS — E785 Hyperlipidemia, unspecified: Secondary | ICD-10-CM | POA: Diagnosis not present

## 2015-10-31 DIAGNOSIS — I340 Nonrheumatic mitral (valve) insufficiency: Secondary | ICD-10-CM | POA: Diagnosis not present

## 2015-10-31 DIAGNOSIS — E663 Overweight: Secondary | ICD-10-CM | POA: Diagnosis not present

## 2015-10-31 DIAGNOSIS — E039 Hypothyroidism, unspecified: Secondary | ICD-10-CM | POA: Diagnosis not present

## 2015-10-31 DIAGNOSIS — I482 Chronic atrial fibrillation: Secondary | ICD-10-CM | POA: Diagnosis not present

## 2015-10-31 DIAGNOSIS — R0602 Shortness of breath: Secondary | ICD-10-CM | POA: Diagnosis not present

## 2015-12-01 DIAGNOSIS — Z7901 Long term (current) use of anticoagulants: Secondary | ICD-10-CM | POA: Diagnosis not present

## 2015-12-01 DIAGNOSIS — I872 Venous insufficiency (chronic) (peripheral): Secondary | ICD-10-CM | POA: Diagnosis not present

## 2015-12-01 DIAGNOSIS — I340 Nonrheumatic mitral (valve) insufficiency: Secondary | ICD-10-CM | POA: Diagnosis not present

## 2015-12-01 DIAGNOSIS — E039 Hypothyroidism, unspecified: Secondary | ICD-10-CM | POA: Diagnosis not present

## 2015-12-01 DIAGNOSIS — E663 Overweight: Secondary | ICD-10-CM | POA: Diagnosis not present

## 2015-12-01 DIAGNOSIS — I482 Chronic atrial fibrillation: Secondary | ICD-10-CM | POA: Diagnosis not present

## 2015-12-01 DIAGNOSIS — R0602 Shortness of breath: Secondary | ICD-10-CM | POA: Diagnosis not present

## 2015-12-01 DIAGNOSIS — Z950 Presence of cardiac pacemaker: Secondary | ICD-10-CM | POA: Diagnosis not present

## 2015-12-01 DIAGNOSIS — E785 Hyperlipidemia, unspecified: Secondary | ICD-10-CM | POA: Diagnosis not present

## 2015-12-19 DIAGNOSIS — J019 Acute sinusitis, unspecified: Secondary | ICD-10-CM | POA: Diagnosis not present

## 2015-12-19 DIAGNOSIS — J3089 Other allergic rhinitis: Secondary | ICD-10-CM | POA: Diagnosis not present

## 2015-12-19 DIAGNOSIS — H1045 Other chronic allergic conjunctivitis: Secondary | ICD-10-CM | POA: Diagnosis not present

## 2015-12-19 DIAGNOSIS — L509 Urticaria, unspecified: Secondary | ICD-10-CM | POA: Diagnosis not present

## 2015-12-31 DIAGNOSIS — M25572 Pain in left ankle and joints of left foot: Secondary | ICD-10-CM | POA: Diagnosis not present

## 2015-12-31 DIAGNOSIS — Z7901 Long term (current) use of anticoagulants: Secondary | ICD-10-CM | POA: Diagnosis not present

## 2015-12-31 DIAGNOSIS — Z23 Encounter for immunization: Secondary | ICD-10-CM | POA: Diagnosis not present

## 2015-12-31 DIAGNOSIS — I739 Peripheral vascular disease, unspecified: Secondary | ICD-10-CM | POA: Diagnosis not present

## 2015-12-31 DIAGNOSIS — I480 Paroxysmal atrial fibrillation: Secondary | ICD-10-CM | POA: Diagnosis not present

## 2016-01-09 DIAGNOSIS — E039 Hypothyroidism, unspecified: Secondary | ICD-10-CM | POA: Diagnosis not present

## 2016-01-09 DIAGNOSIS — I482 Chronic atrial fibrillation: Secondary | ICD-10-CM | POA: Diagnosis not present

## 2016-01-09 DIAGNOSIS — E785 Hyperlipidemia, unspecified: Secondary | ICD-10-CM | POA: Diagnosis not present

## 2016-01-09 DIAGNOSIS — I340 Nonrheumatic mitral (valve) insufficiency: Secondary | ICD-10-CM | POA: Diagnosis not present

## 2016-01-09 DIAGNOSIS — R0602 Shortness of breath: Secondary | ICD-10-CM | POA: Diagnosis not present

## 2016-01-09 DIAGNOSIS — I872 Venous insufficiency (chronic) (peripheral): Secondary | ICD-10-CM | POA: Diagnosis not present

## 2016-01-09 DIAGNOSIS — E663 Overweight: Secondary | ICD-10-CM | POA: Diagnosis not present

## 2016-01-09 DIAGNOSIS — Z7901 Long term (current) use of anticoagulants: Secondary | ICD-10-CM | POA: Diagnosis not present

## 2016-01-09 DIAGNOSIS — Z950 Presence of cardiac pacemaker: Secondary | ICD-10-CM | POA: Diagnosis not present

## 2016-01-14 DIAGNOSIS — R55 Syncope and collapse: Secondary | ICD-10-CM | POA: Diagnosis not present

## 2016-01-14 DIAGNOSIS — R0609 Other forms of dyspnea: Secondary | ICD-10-CM | POA: Diagnosis not present

## 2016-01-14 DIAGNOSIS — Z950 Presence of cardiac pacemaker: Secondary | ICD-10-CM | POA: Diagnosis not present

## 2016-01-14 DIAGNOSIS — D692 Other nonthrombocytopenic purpura: Secondary | ICD-10-CM | POA: Diagnosis not present

## 2016-01-14 DIAGNOSIS — M545 Low back pain: Secondary | ICD-10-CM | POA: Diagnosis not present

## 2016-01-14 DIAGNOSIS — M542 Cervicalgia: Secondary | ICD-10-CM | POA: Diagnosis not present

## 2016-01-14 DIAGNOSIS — I739 Peripheral vascular disease, unspecified: Secondary | ICD-10-CM | POA: Diagnosis not present

## 2016-01-14 DIAGNOSIS — Z6826 Body mass index (BMI) 26.0-26.9, adult: Secondary | ICD-10-CM | POA: Diagnosis not present

## 2016-01-14 DIAGNOSIS — G629 Polyneuropathy, unspecified: Secondary | ICD-10-CM | POA: Diagnosis not present

## 2016-01-14 DIAGNOSIS — M1712 Unilateral primary osteoarthritis, left knee: Secondary | ICD-10-CM | POA: Diagnosis not present

## 2016-01-14 DIAGNOSIS — I480 Paroxysmal atrial fibrillation: Secondary | ICD-10-CM | POA: Diagnosis not present

## 2016-01-20 DIAGNOSIS — Z950 Presence of cardiac pacemaker: Secondary | ICD-10-CM | POA: Diagnosis not present

## 2016-01-20 DIAGNOSIS — R0602 Shortness of breath: Secondary | ICD-10-CM | POA: Diagnosis not present

## 2016-01-20 DIAGNOSIS — Z7901 Long term (current) use of anticoagulants: Secondary | ICD-10-CM | POA: Diagnosis not present

## 2016-01-20 DIAGNOSIS — E039 Hypothyroidism, unspecified: Secondary | ICD-10-CM | POA: Diagnosis not present

## 2016-01-20 DIAGNOSIS — I872 Venous insufficiency (chronic) (peripheral): Secondary | ICD-10-CM | POA: Diagnosis not present

## 2016-01-20 DIAGNOSIS — I340 Nonrheumatic mitral (valve) insufficiency: Secondary | ICD-10-CM | POA: Diagnosis not present

## 2016-01-20 DIAGNOSIS — E663 Overweight: Secondary | ICD-10-CM | POA: Diagnosis not present

## 2016-01-20 DIAGNOSIS — I482 Chronic atrial fibrillation: Secondary | ICD-10-CM | POA: Diagnosis not present

## 2016-01-20 DIAGNOSIS — E785 Hyperlipidemia, unspecified: Secondary | ICD-10-CM | POA: Diagnosis not present

## 2016-01-29 DIAGNOSIS — Z1231 Encounter for screening mammogram for malignant neoplasm of breast: Secondary | ICD-10-CM | POA: Diagnosis not present

## 2016-01-29 DIAGNOSIS — Z803 Family history of malignant neoplasm of breast: Secondary | ICD-10-CM | POA: Diagnosis not present

## 2016-02-05 DIAGNOSIS — G5602 Carpal tunnel syndrome, left upper limb: Secondary | ICD-10-CM | POA: Diagnosis not present

## 2016-02-05 DIAGNOSIS — G5601 Carpal tunnel syndrome, right upper limb: Secondary | ICD-10-CM | POA: Diagnosis not present

## 2016-02-05 DIAGNOSIS — G5603 Carpal tunnel syndrome, bilateral upper limbs: Secondary | ICD-10-CM | POA: Diagnosis not present

## 2016-02-09 DIAGNOSIS — E663 Overweight: Secondary | ICD-10-CM | POA: Diagnosis not present

## 2016-02-09 DIAGNOSIS — E785 Hyperlipidemia, unspecified: Secondary | ICD-10-CM | POA: Diagnosis not present

## 2016-02-09 DIAGNOSIS — I872 Venous insufficiency (chronic) (peripheral): Secondary | ICD-10-CM | POA: Diagnosis not present

## 2016-02-09 DIAGNOSIS — I482 Chronic atrial fibrillation: Secondary | ICD-10-CM | POA: Diagnosis not present

## 2016-02-09 DIAGNOSIS — I340 Nonrheumatic mitral (valve) insufficiency: Secondary | ICD-10-CM | POA: Diagnosis not present

## 2016-02-09 DIAGNOSIS — Z7901 Long term (current) use of anticoagulants: Secondary | ICD-10-CM | POA: Diagnosis not present

## 2016-02-09 DIAGNOSIS — Z950 Presence of cardiac pacemaker: Secondary | ICD-10-CM | POA: Diagnosis not present

## 2016-02-09 DIAGNOSIS — E039 Hypothyroidism, unspecified: Secondary | ICD-10-CM | POA: Diagnosis not present

## 2016-02-09 DIAGNOSIS — R0602 Shortness of breath: Secondary | ICD-10-CM | POA: Diagnosis not present

## 2016-02-25 DIAGNOSIS — Z950 Presence of cardiac pacemaker: Secondary | ICD-10-CM | POA: Diagnosis not present

## 2016-02-25 DIAGNOSIS — Z7901 Long term (current) use of anticoagulants: Secondary | ICD-10-CM | POA: Diagnosis not present

## 2016-02-25 DIAGNOSIS — I872 Venous insufficiency (chronic) (peripheral): Secondary | ICD-10-CM | POA: Diagnosis not present

## 2016-02-25 DIAGNOSIS — E785 Hyperlipidemia, unspecified: Secondary | ICD-10-CM | POA: Diagnosis not present

## 2016-02-25 DIAGNOSIS — R0602 Shortness of breath: Secondary | ICD-10-CM | POA: Diagnosis not present

## 2016-02-25 DIAGNOSIS — E039 Hypothyroidism, unspecified: Secondary | ICD-10-CM | POA: Diagnosis not present

## 2016-02-25 DIAGNOSIS — E663 Overweight: Secondary | ICD-10-CM | POA: Diagnosis not present

## 2016-02-25 DIAGNOSIS — I482 Chronic atrial fibrillation: Secondary | ICD-10-CM | POA: Diagnosis not present

## 2016-02-25 DIAGNOSIS — I340 Nonrheumatic mitral (valve) insufficiency: Secondary | ICD-10-CM | POA: Diagnosis not present

## 2016-03-10 DIAGNOSIS — E785 Hyperlipidemia, unspecified: Secondary | ICD-10-CM | POA: Diagnosis not present

## 2016-03-10 DIAGNOSIS — Z7901 Long term (current) use of anticoagulants: Secondary | ICD-10-CM | POA: Diagnosis not present

## 2016-03-10 DIAGNOSIS — I872 Venous insufficiency (chronic) (peripheral): Secondary | ICD-10-CM | POA: Diagnosis not present

## 2016-03-10 DIAGNOSIS — Z950 Presence of cardiac pacemaker: Secondary | ICD-10-CM | POA: Diagnosis not present

## 2016-03-10 DIAGNOSIS — I482 Chronic atrial fibrillation: Secondary | ICD-10-CM | POA: Diagnosis not present

## 2016-03-10 DIAGNOSIS — I340 Nonrheumatic mitral (valve) insufficiency: Secondary | ICD-10-CM | POA: Diagnosis not present

## 2016-03-10 DIAGNOSIS — E039 Hypothyroidism, unspecified: Secondary | ICD-10-CM | POA: Diagnosis not present

## 2016-03-10 DIAGNOSIS — E663 Overweight: Secondary | ICD-10-CM | POA: Diagnosis not present

## 2016-03-10 DIAGNOSIS — R0602 Shortness of breath: Secondary | ICD-10-CM | POA: Diagnosis not present

## 2016-03-26 DIAGNOSIS — R0602 Shortness of breath: Secondary | ICD-10-CM | POA: Diagnosis not present

## 2016-03-26 DIAGNOSIS — E039 Hypothyroidism, unspecified: Secondary | ICD-10-CM | POA: Diagnosis not present

## 2016-03-26 DIAGNOSIS — Z7901 Long term (current) use of anticoagulants: Secondary | ICD-10-CM | POA: Diagnosis not present

## 2016-03-26 DIAGNOSIS — E785 Hyperlipidemia, unspecified: Secondary | ICD-10-CM | POA: Diagnosis not present

## 2016-03-26 DIAGNOSIS — I340 Nonrheumatic mitral (valve) insufficiency: Secondary | ICD-10-CM | POA: Diagnosis not present

## 2016-03-26 DIAGNOSIS — I482 Chronic atrial fibrillation: Secondary | ICD-10-CM | POA: Diagnosis not present

## 2016-03-26 DIAGNOSIS — I872 Venous insufficiency (chronic) (peripheral): Secondary | ICD-10-CM | POA: Diagnosis not present

## 2016-03-26 DIAGNOSIS — E663 Overweight: Secondary | ICD-10-CM | POA: Diagnosis not present

## 2016-03-26 DIAGNOSIS — Z950 Presence of cardiac pacemaker: Secondary | ICD-10-CM | POA: Diagnosis not present

## 2016-04-20 DIAGNOSIS — E663 Overweight: Secondary | ICD-10-CM | POA: Diagnosis not present

## 2016-04-20 DIAGNOSIS — I872 Venous insufficiency (chronic) (peripheral): Secondary | ICD-10-CM | POA: Diagnosis not present

## 2016-04-20 DIAGNOSIS — E039 Hypothyroidism, unspecified: Secondary | ICD-10-CM | POA: Diagnosis not present

## 2016-04-20 DIAGNOSIS — I340 Nonrheumatic mitral (valve) insufficiency: Secondary | ICD-10-CM | POA: Diagnosis not present

## 2016-04-20 DIAGNOSIS — Z950 Presence of cardiac pacemaker: Secondary | ICD-10-CM | POA: Diagnosis not present

## 2016-04-20 DIAGNOSIS — I482 Chronic atrial fibrillation: Secondary | ICD-10-CM | POA: Diagnosis not present

## 2016-04-20 DIAGNOSIS — Z7901 Long term (current) use of anticoagulants: Secondary | ICD-10-CM | POA: Diagnosis not present

## 2016-04-20 DIAGNOSIS — E785 Hyperlipidemia, unspecified: Secondary | ICD-10-CM | POA: Diagnosis not present

## 2016-04-20 DIAGNOSIS — R0602 Shortness of breath: Secondary | ICD-10-CM | POA: Diagnosis not present

## 2016-04-23 DIAGNOSIS — I482 Chronic atrial fibrillation: Secondary | ICD-10-CM | POA: Diagnosis not present

## 2016-04-23 DIAGNOSIS — I872 Venous insufficiency (chronic) (peripheral): Secondary | ICD-10-CM | POA: Diagnosis not present

## 2016-04-23 DIAGNOSIS — E785 Hyperlipidemia, unspecified: Secondary | ICD-10-CM | POA: Diagnosis not present

## 2016-04-23 DIAGNOSIS — E663 Overweight: Secondary | ICD-10-CM | POA: Diagnosis not present

## 2016-04-23 DIAGNOSIS — Z7901 Long term (current) use of anticoagulants: Secondary | ICD-10-CM | POA: Diagnosis not present

## 2016-04-23 DIAGNOSIS — R0602 Shortness of breath: Secondary | ICD-10-CM | POA: Diagnosis not present

## 2016-04-23 DIAGNOSIS — Z950 Presence of cardiac pacemaker: Secondary | ICD-10-CM | POA: Diagnosis not present

## 2016-04-23 DIAGNOSIS — E039 Hypothyroidism, unspecified: Secondary | ICD-10-CM | POA: Diagnosis not present

## 2016-04-23 DIAGNOSIS — I340 Nonrheumatic mitral (valve) insufficiency: Secondary | ICD-10-CM | POA: Diagnosis not present

## 2016-05-21 DIAGNOSIS — I482 Chronic atrial fibrillation: Secondary | ICD-10-CM | POA: Diagnosis not present

## 2016-05-21 DIAGNOSIS — Z950 Presence of cardiac pacemaker: Secondary | ICD-10-CM | POA: Diagnosis not present

## 2016-05-21 DIAGNOSIS — E663 Overweight: Secondary | ICD-10-CM | POA: Diagnosis not present

## 2016-05-21 DIAGNOSIS — E785 Hyperlipidemia, unspecified: Secondary | ICD-10-CM | POA: Diagnosis not present

## 2016-05-21 DIAGNOSIS — I340 Nonrheumatic mitral (valve) insufficiency: Secondary | ICD-10-CM | POA: Diagnosis not present

## 2016-05-21 DIAGNOSIS — R0602 Shortness of breath: Secondary | ICD-10-CM | POA: Diagnosis not present

## 2016-05-21 DIAGNOSIS — I872 Venous insufficiency (chronic) (peripheral): Secondary | ICD-10-CM | POA: Diagnosis not present

## 2016-05-21 DIAGNOSIS — E039 Hypothyroidism, unspecified: Secondary | ICD-10-CM | POA: Diagnosis not present

## 2016-05-21 DIAGNOSIS — Z7901 Long term (current) use of anticoagulants: Secondary | ICD-10-CM | POA: Diagnosis not present

## 2016-05-26 DIAGNOSIS — B070 Plantar wart: Secondary | ICD-10-CM | POA: Diagnosis not present

## 2016-06-17 DIAGNOSIS — I340 Nonrheumatic mitral (valve) insufficiency: Secondary | ICD-10-CM | POA: Diagnosis not present

## 2016-06-17 DIAGNOSIS — I482 Chronic atrial fibrillation: Secondary | ICD-10-CM | POA: Diagnosis not present

## 2016-06-17 DIAGNOSIS — I872 Venous insufficiency (chronic) (peripheral): Secondary | ICD-10-CM | POA: Diagnosis not present

## 2016-06-17 DIAGNOSIS — R0602 Shortness of breath: Secondary | ICD-10-CM | POA: Diagnosis not present

## 2016-06-17 DIAGNOSIS — E663 Overweight: Secondary | ICD-10-CM | POA: Diagnosis not present

## 2016-06-17 DIAGNOSIS — E039 Hypothyroidism, unspecified: Secondary | ICD-10-CM | POA: Diagnosis not present

## 2016-06-17 DIAGNOSIS — E785 Hyperlipidemia, unspecified: Secondary | ICD-10-CM | POA: Diagnosis not present

## 2016-06-17 DIAGNOSIS — Z7901 Long term (current) use of anticoagulants: Secondary | ICD-10-CM | POA: Diagnosis not present

## 2016-06-17 DIAGNOSIS — Z950 Presence of cardiac pacemaker: Secondary | ICD-10-CM | POA: Diagnosis not present

## 2016-07-09 DIAGNOSIS — E785 Hyperlipidemia, unspecified: Secondary | ICD-10-CM | POA: Diagnosis not present

## 2016-07-09 DIAGNOSIS — Z950 Presence of cardiac pacemaker: Secondary | ICD-10-CM | POA: Diagnosis not present

## 2016-07-09 DIAGNOSIS — I872 Venous insufficiency (chronic) (peripheral): Secondary | ICD-10-CM | POA: Diagnosis not present

## 2016-07-09 DIAGNOSIS — E663 Overweight: Secondary | ICD-10-CM | POA: Diagnosis not present

## 2016-07-09 DIAGNOSIS — I340 Nonrheumatic mitral (valve) insufficiency: Secondary | ICD-10-CM | POA: Diagnosis not present

## 2016-07-09 DIAGNOSIS — Z7901 Long term (current) use of anticoagulants: Secondary | ICD-10-CM | POA: Diagnosis not present

## 2016-07-09 DIAGNOSIS — R0602 Shortness of breath: Secondary | ICD-10-CM | POA: Diagnosis not present

## 2016-07-09 DIAGNOSIS — E039 Hypothyroidism, unspecified: Secondary | ICD-10-CM | POA: Diagnosis not present

## 2016-07-09 DIAGNOSIS — I482 Chronic atrial fibrillation: Secondary | ICD-10-CM | POA: Diagnosis not present

## 2016-07-14 DIAGNOSIS — E784 Other hyperlipidemia: Secondary | ICD-10-CM | POA: Diagnosis not present

## 2016-07-14 DIAGNOSIS — Z79899 Other long term (current) drug therapy: Secondary | ICD-10-CM | POA: Diagnosis not present

## 2016-07-14 DIAGNOSIS — I480 Paroxysmal atrial fibrillation: Secondary | ICD-10-CM | POA: Diagnosis not present

## 2016-07-14 DIAGNOSIS — E038 Other specified hypothyroidism: Secondary | ICD-10-CM | POA: Diagnosis not present

## 2016-07-19 DIAGNOSIS — Z950 Presence of cardiac pacemaker: Secondary | ICD-10-CM | POA: Diagnosis not present

## 2016-07-19 DIAGNOSIS — E039 Hypothyroidism, unspecified: Secondary | ICD-10-CM | POA: Diagnosis not present

## 2016-07-19 DIAGNOSIS — E663 Overweight: Secondary | ICD-10-CM | POA: Diagnosis not present

## 2016-07-19 DIAGNOSIS — I482 Chronic atrial fibrillation: Secondary | ICD-10-CM | POA: Diagnosis not present

## 2016-07-19 DIAGNOSIS — E785 Hyperlipidemia, unspecified: Secondary | ICD-10-CM | POA: Diagnosis not present

## 2016-07-19 DIAGNOSIS — I340 Nonrheumatic mitral (valve) insufficiency: Secondary | ICD-10-CM | POA: Diagnosis not present

## 2016-07-19 DIAGNOSIS — I872 Venous insufficiency (chronic) (peripheral): Secondary | ICD-10-CM | POA: Diagnosis not present

## 2016-07-19 DIAGNOSIS — R0602 Shortness of breath: Secondary | ICD-10-CM | POA: Diagnosis not present

## 2016-07-19 DIAGNOSIS — Z7901 Long term (current) use of anticoagulants: Secondary | ICD-10-CM | POA: Diagnosis not present

## 2016-07-21 DIAGNOSIS — I340 Nonrheumatic mitral (valve) insufficiency: Secondary | ICD-10-CM | POA: Diagnosis not present

## 2016-07-21 DIAGNOSIS — E039 Hypothyroidism, unspecified: Secondary | ICD-10-CM | POA: Diagnosis not present

## 2016-07-21 DIAGNOSIS — R0602 Shortness of breath: Secondary | ICD-10-CM | POA: Diagnosis not present

## 2016-07-21 DIAGNOSIS — I872 Venous insufficiency (chronic) (peripheral): Secondary | ICD-10-CM | POA: Diagnosis not present

## 2016-07-21 DIAGNOSIS — I482 Chronic atrial fibrillation: Secondary | ICD-10-CM | POA: Diagnosis not present

## 2016-07-21 DIAGNOSIS — Z7901 Long term (current) use of anticoagulants: Secondary | ICD-10-CM | POA: Diagnosis not present

## 2016-07-21 DIAGNOSIS — E785 Hyperlipidemia, unspecified: Secondary | ICD-10-CM | POA: Diagnosis not present

## 2016-07-21 DIAGNOSIS — E663 Overweight: Secondary | ICD-10-CM | POA: Diagnosis not present

## 2016-07-21 DIAGNOSIS — Z950 Presence of cardiac pacemaker: Secondary | ICD-10-CM | POA: Diagnosis not present

## 2016-07-23 DIAGNOSIS — I480 Paroxysmal atrial fibrillation: Secondary | ICD-10-CM | POA: Diagnosis not present

## 2016-07-23 DIAGNOSIS — Z6826 Body mass index (BMI) 26.0-26.9, adult: Secondary | ICD-10-CM | POA: Diagnosis not present

## 2016-07-23 DIAGNOSIS — D692 Other nonthrombocytopenic purpura: Secondary | ICD-10-CM | POA: Diagnosis not present

## 2016-07-23 DIAGNOSIS — Z1389 Encounter for screening for other disorder: Secondary | ICD-10-CM | POA: Diagnosis not present

## 2016-07-23 DIAGNOSIS — I739 Peripheral vascular disease, unspecified: Secondary | ICD-10-CM | POA: Diagnosis not present

## 2016-07-23 DIAGNOSIS — M1712 Unilateral primary osteoarthritis, left knee: Secondary | ICD-10-CM | POA: Diagnosis not present

## 2016-07-23 DIAGNOSIS — G629 Polyneuropathy, unspecified: Secondary | ICD-10-CM | POA: Diagnosis not present

## 2016-07-23 DIAGNOSIS — Z950 Presence of cardiac pacemaker: Secondary | ICD-10-CM | POA: Diagnosis not present

## 2016-07-23 DIAGNOSIS — E784 Other hyperlipidemia: Secondary | ICD-10-CM | POA: Diagnosis not present

## 2016-07-23 DIAGNOSIS — Z0000 Encounter for general adult medical examination without abnormal findings: Secondary | ICD-10-CM | POA: Diagnosis not present

## 2016-07-23 DIAGNOSIS — M542 Cervicalgia: Secondary | ICD-10-CM | POA: Diagnosis not present

## 2016-07-23 DIAGNOSIS — I872 Venous insufficiency (chronic) (peripheral): Secondary | ICD-10-CM | POA: Diagnosis not present

## 2016-07-27 DIAGNOSIS — Z1212 Encounter for screening for malignant neoplasm of rectum: Secondary | ICD-10-CM | POA: Diagnosis not present

## 2016-08-03 DIAGNOSIS — G5603 Carpal tunnel syndrome, bilateral upper limbs: Secondary | ICD-10-CM | POA: Diagnosis not present

## 2016-08-03 DIAGNOSIS — G5601 Carpal tunnel syndrome, right upper limb: Secondary | ICD-10-CM | POA: Diagnosis not present

## 2016-08-03 DIAGNOSIS — G5602 Carpal tunnel syndrome, left upper limb: Secondary | ICD-10-CM | POA: Diagnosis not present

## 2016-08-13 DIAGNOSIS — E663 Overweight: Secondary | ICD-10-CM | POA: Diagnosis not present

## 2016-08-13 DIAGNOSIS — E039 Hypothyroidism, unspecified: Secondary | ICD-10-CM | POA: Diagnosis not present

## 2016-08-13 DIAGNOSIS — Z950 Presence of cardiac pacemaker: Secondary | ICD-10-CM | POA: Diagnosis not present

## 2016-08-13 DIAGNOSIS — I340 Nonrheumatic mitral (valve) insufficiency: Secondary | ICD-10-CM | POA: Diagnosis not present

## 2016-08-13 DIAGNOSIS — E785 Hyperlipidemia, unspecified: Secondary | ICD-10-CM | POA: Diagnosis not present

## 2016-08-13 DIAGNOSIS — I872 Venous insufficiency (chronic) (peripheral): Secondary | ICD-10-CM | POA: Diagnosis not present

## 2016-08-13 DIAGNOSIS — Z7901 Long term (current) use of anticoagulants: Secondary | ICD-10-CM | POA: Diagnosis not present

## 2016-08-13 DIAGNOSIS — I482 Chronic atrial fibrillation: Secondary | ICD-10-CM | POA: Diagnosis not present

## 2016-08-13 DIAGNOSIS — R0602 Shortness of breath: Secondary | ICD-10-CM | POA: Diagnosis not present

## 2016-08-31 DIAGNOSIS — G5601 Carpal tunnel syndrome, right upper limb: Secondary | ICD-10-CM | POA: Diagnosis not present

## 2016-08-31 DIAGNOSIS — G5603 Carpal tunnel syndrome, bilateral upper limbs: Secondary | ICD-10-CM | POA: Diagnosis not present

## 2016-08-31 DIAGNOSIS — G5602 Carpal tunnel syndrome, left upper limb: Secondary | ICD-10-CM | POA: Diagnosis not present

## 2016-09-08 ENCOUNTER — Other Ambulatory Visit: Payer: Self-pay | Admitting: Internal Medicine

## 2016-09-08 DIAGNOSIS — I480 Paroxysmal atrial fibrillation: Secondary | ICD-10-CM | POA: Diagnosis not present

## 2016-09-08 DIAGNOSIS — H538 Other visual disturbances: Secondary | ICD-10-CM | POA: Diagnosis not present

## 2016-09-08 DIAGNOSIS — M542 Cervicalgia: Secondary | ICD-10-CM | POA: Diagnosis not present

## 2016-09-08 DIAGNOSIS — Z6825 Body mass index (BMI) 25.0-25.9, adult: Secondary | ICD-10-CM | POA: Diagnosis not present

## 2016-09-08 DIAGNOSIS — N63 Unspecified lump in unspecified breast: Secondary | ICD-10-CM | POA: Diagnosis not present

## 2016-09-08 DIAGNOSIS — R42 Dizziness and giddiness: Secondary | ICD-10-CM

## 2016-09-08 DIAGNOSIS — R5383 Other fatigue: Secondary | ICD-10-CM | POA: Diagnosis not present

## 2016-09-10 DIAGNOSIS — E039 Hypothyroidism, unspecified: Secondary | ICD-10-CM | POA: Diagnosis not present

## 2016-09-10 DIAGNOSIS — Z950 Presence of cardiac pacemaker: Secondary | ICD-10-CM | POA: Diagnosis not present

## 2016-09-10 DIAGNOSIS — I872 Venous insufficiency (chronic) (peripheral): Secondary | ICD-10-CM | POA: Diagnosis not present

## 2016-09-10 DIAGNOSIS — R0602 Shortness of breath: Secondary | ICD-10-CM | POA: Diagnosis not present

## 2016-09-10 DIAGNOSIS — I340 Nonrheumatic mitral (valve) insufficiency: Secondary | ICD-10-CM | POA: Diagnosis not present

## 2016-09-10 DIAGNOSIS — Z7901 Long term (current) use of anticoagulants: Secondary | ICD-10-CM | POA: Diagnosis not present

## 2016-09-10 DIAGNOSIS — E785 Hyperlipidemia, unspecified: Secondary | ICD-10-CM | POA: Diagnosis not present

## 2016-09-10 DIAGNOSIS — I482 Chronic atrial fibrillation: Secondary | ICD-10-CM | POA: Diagnosis not present

## 2016-09-10 DIAGNOSIS — E663 Overweight: Secondary | ICD-10-CM | POA: Diagnosis not present

## 2016-09-13 ENCOUNTER — Other Ambulatory Visit: Payer: Medicare Other

## 2016-09-13 ENCOUNTER — Ambulatory Visit
Admission: RE | Admit: 2016-09-13 | Discharge: 2016-09-13 | Disposition: A | Discharge status: Home / Self Care | Payer: Medicare Other | Source: Ambulatory Visit | Attending: Internal Medicine | Admitting: Internal Medicine

## 2016-09-13 DIAGNOSIS — R42 Dizziness and giddiness: Secondary | ICD-10-CM

## 2016-09-13 DIAGNOSIS — M4802 Spinal stenosis, cervical region: Secondary | ICD-10-CM | POA: Diagnosis not present

## 2016-09-13 DIAGNOSIS — M542 Cervicalgia: Secondary | ICD-10-CM

## 2016-10-06 DIAGNOSIS — H02051 Trichiasis without entropian right upper eyelid: Secondary | ICD-10-CM | POA: Diagnosis not present

## 2016-10-06 DIAGNOSIS — H26492 Other secondary cataract, left eye: Secondary | ICD-10-CM | POA: Diagnosis not present

## 2016-10-06 DIAGNOSIS — H5213 Myopia, bilateral: Secondary | ICD-10-CM | POA: Diagnosis not present

## 2016-10-06 DIAGNOSIS — Z961 Presence of intraocular lens: Secondary | ICD-10-CM | POA: Diagnosis not present

## 2016-10-08 DIAGNOSIS — I482 Chronic atrial fibrillation: Secondary | ICD-10-CM | POA: Diagnosis not present

## 2016-10-08 DIAGNOSIS — I872 Venous insufficiency (chronic) (peripheral): Secondary | ICD-10-CM | POA: Diagnosis not present

## 2016-10-08 DIAGNOSIS — E039 Hypothyroidism, unspecified: Secondary | ICD-10-CM | POA: Diagnosis not present

## 2016-10-08 DIAGNOSIS — E663 Overweight: Secondary | ICD-10-CM | POA: Diagnosis not present

## 2016-10-08 DIAGNOSIS — R0602 Shortness of breath: Secondary | ICD-10-CM | POA: Diagnosis not present

## 2016-10-08 DIAGNOSIS — Z7901 Long term (current) use of anticoagulants: Secondary | ICD-10-CM | POA: Diagnosis not present

## 2016-10-08 DIAGNOSIS — E785 Hyperlipidemia, unspecified: Secondary | ICD-10-CM | POA: Diagnosis not present

## 2016-10-08 DIAGNOSIS — I340 Nonrheumatic mitral (valve) insufficiency: Secondary | ICD-10-CM | POA: Diagnosis not present

## 2016-10-08 DIAGNOSIS — Z950 Presence of cardiac pacemaker: Secondary | ICD-10-CM | POA: Diagnosis not present

## 2016-10-12 DIAGNOSIS — G5602 Carpal tunnel syndrome, left upper limb: Secondary | ICD-10-CM | POA: Diagnosis not present

## 2016-10-12 DIAGNOSIS — G5601 Carpal tunnel syndrome, right upper limb: Secondary | ICD-10-CM | POA: Diagnosis not present

## 2016-10-13 DIAGNOSIS — M542 Cervicalgia: Secondary | ICD-10-CM | POA: Diagnosis not present

## 2016-10-13 DIAGNOSIS — M4682 Other specified inflammatory spondylopathies, cervical region: Secondary | ICD-10-CM | POA: Diagnosis not present

## 2016-10-21 DIAGNOSIS — H26492 Other secondary cataract, left eye: Secondary | ICD-10-CM | POA: Diagnosis not present

## 2016-10-25 DIAGNOSIS — R0602 Shortness of breath: Secondary | ICD-10-CM | POA: Diagnosis not present

## 2016-10-25 DIAGNOSIS — Z950 Presence of cardiac pacemaker: Secondary | ICD-10-CM | POA: Diagnosis not present

## 2016-10-25 DIAGNOSIS — I340 Nonrheumatic mitral (valve) insufficiency: Secondary | ICD-10-CM | POA: Diagnosis not present

## 2016-10-25 DIAGNOSIS — E039 Hypothyroidism, unspecified: Secondary | ICD-10-CM | POA: Diagnosis not present

## 2016-10-25 DIAGNOSIS — I872 Venous insufficiency (chronic) (peripheral): Secondary | ICD-10-CM | POA: Diagnosis not present

## 2016-10-25 DIAGNOSIS — I482 Chronic atrial fibrillation: Secondary | ICD-10-CM | POA: Diagnosis not present

## 2016-10-25 DIAGNOSIS — Z7901 Long term (current) use of anticoagulants: Secondary | ICD-10-CM | POA: Diagnosis not present

## 2016-10-25 DIAGNOSIS — E663 Overweight: Secondary | ICD-10-CM | POA: Diagnosis not present

## 2016-10-25 DIAGNOSIS — E785 Hyperlipidemia, unspecified: Secondary | ICD-10-CM | POA: Diagnosis not present

## 2016-10-28 DIAGNOSIS — E039 Hypothyroidism, unspecified: Secondary | ICD-10-CM | POA: Diagnosis not present

## 2016-10-28 DIAGNOSIS — I482 Chronic atrial fibrillation: Secondary | ICD-10-CM | POA: Diagnosis not present

## 2016-10-28 DIAGNOSIS — Z7901 Long term (current) use of anticoagulants: Secondary | ICD-10-CM | POA: Diagnosis not present

## 2016-10-28 DIAGNOSIS — R42 Dizziness and giddiness: Secondary | ICD-10-CM | POA: Diagnosis not present

## 2016-10-28 DIAGNOSIS — I872 Venous insufficiency (chronic) (peripheral): Secondary | ICD-10-CM | POA: Diagnosis not present

## 2016-10-28 DIAGNOSIS — I340 Nonrheumatic mitral (valve) insufficiency: Secondary | ICD-10-CM | POA: Diagnosis not present

## 2016-10-28 DIAGNOSIS — E785 Hyperlipidemia, unspecified: Secondary | ICD-10-CM | POA: Diagnosis not present

## 2016-10-28 DIAGNOSIS — E663 Overweight: Secondary | ICD-10-CM | POA: Diagnosis not present

## 2016-10-28 DIAGNOSIS — Z950 Presence of cardiac pacemaker: Secondary | ICD-10-CM | POA: Diagnosis not present

## 2016-10-28 DIAGNOSIS — R0602 Shortness of breath: Secondary | ICD-10-CM | POA: Diagnosis not present

## 2016-10-29 DIAGNOSIS — I951 Orthostatic hypotension: Secondary | ICD-10-CM | POA: Diagnosis not present

## 2016-11-05 DIAGNOSIS — E663 Overweight: Secondary | ICD-10-CM | POA: Diagnosis not present

## 2016-11-05 DIAGNOSIS — I951 Orthostatic hypotension: Secondary | ICD-10-CM | POA: Diagnosis not present

## 2016-11-05 DIAGNOSIS — E785 Hyperlipidemia, unspecified: Secondary | ICD-10-CM | POA: Diagnosis not present

## 2016-11-05 DIAGNOSIS — Z950 Presence of cardiac pacemaker: Secondary | ICD-10-CM | POA: Diagnosis not present

## 2016-11-05 DIAGNOSIS — E039 Hypothyroidism, unspecified: Secondary | ICD-10-CM | POA: Diagnosis not present

## 2016-11-05 DIAGNOSIS — Z7901 Long term (current) use of anticoagulants: Secondary | ICD-10-CM | POA: Diagnosis not present

## 2016-11-05 DIAGNOSIS — I340 Nonrheumatic mitral (valve) insufficiency: Secondary | ICD-10-CM | POA: Diagnosis not present

## 2016-11-05 DIAGNOSIS — I872 Venous insufficiency (chronic) (peripheral): Secondary | ICD-10-CM | POA: Diagnosis not present

## 2016-11-05 DIAGNOSIS — I482 Chronic atrial fibrillation: Secondary | ICD-10-CM | POA: Diagnosis not present

## 2016-11-05 DIAGNOSIS — R0602 Shortness of breath: Secondary | ICD-10-CM | POA: Diagnosis not present

## 2016-11-05 DIAGNOSIS — R42 Dizziness and giddiness: Secondary | ICD-10-CM | POA: Diagnosis not present

## 2016-11-19 DIAGNOSIS — E039 Hypothyroidism, unspecified: Secondary | ICD-10-CM | POA: Diagnosis not present

## 2016-11-19 DIAGNOSIS — E663 Overweight: Secondary | ICD-10-CM | POA: Diagnosis not present

## 2016-11-19 DIAGNOSIS — I482 Chronic atrial fibrillation: Secondary | ICD-10-CM | POA: Diagnosis not present

## 2016-11-19 DIAGNOSIS — R42 Dizziness and giddiness: Secondary | ICD-10-CM | POA: Diagnosis not present

## 2016-11-19 DIAGNOSIS — I340 Nonrheumatic mitral (valve) insufficiency: Secondary | ICD-10-CM | POA: Diagnosis not present

## 2016-11-19 DIAGNOSIS — E785 Hyperlipidemia, unspecified: Secondary | ICD-10-CM | POA: Diagnosis not present

## 2016-11-19 DIAGNOSIS — I872 Venous insufficiency (chronic) (peripheral): Secondary | ICD-10-CM | POA: Diagnosis not present

## 2016-11-19 DIAGNOSIS — R0602 Shortness of breath: Secondary | ICD-10-CM | POA: Diagnosis not present

## 2016-11-19 DIAGNOSIS — I951 Orthostatic hypotension: Secondary | ICD-10-CM | POA: Diagnosis not present

## 2016-11-19 DIAGNOSIS — Z7901 Long term (current) use of anticoagulants: Secondary | ICD-10-CM | POA: Diagnosis not present

## 2016-11-19 DIAGNOSIS — Z950 Presence of cardiac pacemaker: Secondary | ICD-10-CM | POA: Diagnosis not present

## 2016-12-03 DIAGNOSIS — I482 Chronic atrial fibrillation: Secondary | ICD-10-CM | POA: Diagnosis not present

## 2016-12-03 DIAGNOSIS — I951 Orthostatic hypotension: Secondary | ICD-10-CM | POA: Diagnosis not present

## 2016-12-03 DIAGNOSIS — R42 Dizziness and giddiness: Secondary | ICD-10-CM | POA: Diagnosis not present

## 2016-12-03 DIAGNOSIS — Z7901 Long term (current) use of anticoagulants: Secondary | ICD-10-CM | POA: Diagnosis not present

## 2016-12-03 DIAGNOSIS — E663 Overweight: Secondary | ICD-10-CM | POA: Diagnosis not present

## 2016-12-03 DIAGNOSIS — I340 Nonrheumatic mitral (valve) insufficiency: Secondary | ICD-10-CM | POA: Diagnosis not present

## 2016-12-03 DIAGNOSIS — Z950 Presence of cardiac pacemaker: Secondary | ICD-10-CM | POA: Diagnosis not present

## 2016-12-03 DIAGNOSIS — E785 Hyperlipidemia, unspecified: Secondary | ICD-10-CM | POA: Diagnosis not present

## 2016-12-03 DIAGNOSIS — I872 Venous insufficiency (chronic) (peripheral): Secondary | ICD-10-CM | POA: Diagnosis not present

## 2016-12-03 DIAGNOSIS — R0602 Shortness of breath: Secondary | ICD-10-CM | POA: Diagnosis not present

## 2016-12-03 DIAGNOSIS — E039 Hypothyroidism, unspecified: Secondary | ICD-10-CM | POA: Diagnosis not present

## 2016-12-17 DIAGNOSIS — I872 Venous insufficiency (chronic) (peripheral): Secondary | ICD-10-CM | POA: Diagnosis not present

## 2016-12-17 DIAGNOSIS — J3089 Other allergic rhinitis: Secondary | ICD-10-CM | POA: Diagnosis not present

## 2016-12-17 DIAGNOSIS — Z7901 Long term (current) use of anticoagulants: Secondary | ICD-10-CM | POA: Diagnosis not present

## 2016-12-17 DIAGNOSIS — I482 Chronic atrial fibrillation: Secondary | ICD-10-CM | POA: Diagnosis not present

## 2016-12-17 DIAGNOSIS — R0602 Shortness of breath: Secondary | ICD-10-CM | POA: Diagnosis not present

## 2016-12-17 DIAGNOSIS — I340 Nonrheumatic mitral (valve) insufficiency: Secondary | ICD-10-CM | POA: Diagnosis not present

## 2016-12-17 DIAGNOSIS — Z950 Presence of cardiac pacemaker: Secondary | ICD-10-CM | POA: Diagnosis not present

## 2016-12-17 DIAGNOSIS — I951 Orthostatic hypotension: Secondary | ICD-10-CM | POA: Diagnosis not present

## 2016-12-17 DIAGNOSIS — R42 Dizziness and giddiness: Secondary | ICD-10-CM | POA: Diagnosis not present

## 2016-12-17 DIAGNOSIS — H1045 Other chronic allergic conjunctivitis: Secondary | ICD-10-CM | POA: Diagnosis not present

## 2016-12-17 DIAGNOSIS — L509 Urticaria, unspecified: Secondary | ICD-10-CM | POA: Diagnosis not present

## 2016-12-17 DIAGNOSIS — E663 Overweight: Secondary | ICD-10-CM | POA: Diagnosis not present

## 2016-12-17 DIAGNOSIS — E039 Hypothyroidism, unspecified: Secondary | ICD-10-CM | POA: Diagnosis not present

## 2016-12-17 DIAGNOSIS — E785 Hyperlipidemia, unspecified: Secondary | ICD-10-CM | POA: Diagnosis not present

## 2016-12-17 DIAGNOSIS — S20212A Contusion of left front wall of thorax, initial encounter: Secondary | ICD-10-CM | POA: Diagnosis not present

## 2016-12-22 ENCOUNTER — Telehealth: Payer: Self-pay

## 2016-12-22 ENCOUNTER — Ambulatory Visit: Payer: Medicare Other | Admitting: Family Medicine

## 2016-12-22 DIAGNOSIS — I7389 Other specified peripheral vascular diseases: Secondary | ICD-10-CM | POA: Diagnosis not present

## 2016-12-22 DIAGNOSIS — Z23 Encounter for immunization: Secondary | ICD-10-CM | POA: Diagnosis not present

## 2016-12-22 DIAGNOSIS — R0781 Pleurodynia: Secondary | ICD-10-CM | POA: Diagnosis not present

## 2016-12-22 DIAGNOSIS — R0602 Shortness of breath: Secondary | ICD-10-CM | POA: Diagnosis not present

## 2016-12-22 DIAGNOSIS — Z6826 Body mass index (BMI) 26.0-26.9, adult: Secondary | ICD-10-CM | POA: Diagnosis not present

## 2016-12-22 NOTE — Telephone Encounter (Signed)
Pt walked in; 1 1/2 weeks ago pt fell; had lower rib cage pain which 3 days ago moved to sternal area; hurts worse when moves or takes deep breath. Has bruise on rt side of face. Offered appt as new pt with D Gessner today at 11 AM but pt does not want to see a NP she only wants to see a doctor; none of the doctors had available appts and pt will go to [facility] ED now to be evaluated. Pt does not appear in any distress; pt is driving; offered to call someone or 911 to take pt to ED but pt declined saying she could drive there. Pt may cb to establish care at a later time. Pt also wants to get pacemaker checked since the fall and pt will contact cardiologist.

## 2016-12-24 ENCOUNTER — Ambulatory Visit: Payer: Medicare Other | Admitting: Family Medicine

## 2016-12-28 DIAGNOSIS — S80922D Unspecified superficial injury of left lower leg, subsequent encounter: Secondary | ICD-10-CM | POA: Diagnosis not present

## 2016-12-28 DIAGNOSIS — R42 Dizziness and giddiness: Secondary | ICD-10-CM | POA: Diagnosis not present

## 2016-12-28 DIAGNOSIS — Z5189 Encounter for other specified aftercare: Secondary | ICD-10-CM | POA: Diagnosis not present

## 2016-12-28 DIAGNOSIS — Z6826 Body mass index (BMI) 26.0-26.9, adult: Secondary | ICD-10-CM | POA: Diagnosis not present

## 2016-12-31 DIAGNOSIS — Z5189 Encounter for other specified aftercare: Secondary | ICD-10-CM | POA: Diagnosis not present

## 2016-12-31 DIAGNOSIS — S80922S Unspecified superficial injury of left lower leg, sequela: Secondary | ICD-10-CM | POA: Diagnosis not present

## 2016-12-31 DIAGNOSIS — Z6826 Body mass index (BMI) 26.0-26.9, adult: Secondary | ICD-10-CM | POA: Diagnosis not present

## 2016-12-31 DIAGNOSIS — I739 Peripheral vascular disease, unspecified: Secondary | ICD-10-CM | POA: Diagnosis not present

## 2017-01-05 DIAGNOSIS — Z6827 Body mass index (BMI) 27.0-27.9, adult: Secondary | ICD-10-CM | POA: Diagnosis not present

## 2017-01-05 DIAGNOSIS — R2689 Other abnormalities of gait and mobility: Secondary | ICD-10-CM | POA: Diagnosis not present

## 2017-01-05 DIAGNOSIS — I7389 Other specified peripheral vascular diseases: Secondary | ICD-10-CM | POA: Diagnosis not present

## 2017-01-05 DIAGNOSIS — Z5189 Encounter for other specified aftercare: Secondary | ICD-10-CM | POA: Diagnosis not present

## 2017-01-18 DIAGNOSIS — I340 Nonrheumatic mitral (valve) insufficiency: Secondary | ICD-10-CM | POA: Diagnosis not present

## 2017-01-18 DIAGNOSIS — R0602 Shortness of breath: Secondary | ICD-10-CM | POA: Diagnosis not present

## 2017-01-18 DIAGNOSIS — E039 Hypothyroidism, unspecified: Secondary | ICD-10-CM | POA: Diagnosis not present

## 2017-01-18 DIAGNOSIS — R42 Dizziness and giddiness: Secondary | ICD-10-CM | POA: Diagnosis not present

## 2017-01-18 DIAGNOSIS — E785 Hyperlipidemia, unspecified: Secondary | ICD-10-CM | POA: Diagnosis not present

## 2017-01-18 DIAGNOSIS — I872 Venous insufficiency (chronic) (peripheral): Secondary | ICD-10-CM | POA: Diagnosis not present

## 2017-01-18 DIAGNOSIS — E663 Overweight: Secondary | ICD-10-CM | POA: Diagnosis not present

## 2017-01-18 DIAGNOSIS — Z950 Presence of cardiac pacemaker: Secondary | ICD-10-CM | POA: Diagnosis not present

## 2017-01-18 DIAGNOSIS — Z7901 Long term (current) use of anticoagulants: Secondary | ICD-10-CM | POA: Diagnosis not present

## 2017-01-18 DIAGNOSIS — I482 Chronic atrial fibrillation: Secondary | ICD-10-CM | POA: Diagnosis not present

## 2017-01-18 DIAGNOSIS — I951 Orthostatic hypotension: Secondary | ICD-10-CM | POA: Diagnosis not present

## 2017-01-19 DIAGNOSIS — M545 Low back pain: Secondary | ICD-10-CM | POA: Diagnosis not present

## 2017-01-19 DIAGNOSIS — D692 Other nonthrombocytopenic purpura: Secondary | ICD-10-CM | POA: Diagnosis not present

## 2017-01-19 DIAGNOSIS — R55 Syncope and collapse: Secondary | ICD-10-CM | POA: Diagnosis not present

## 2017-01-19 DIAGNOSIS — G6289 Other specified polyneuropathies: Secondary | ICD-10-CM | POA: Diagnosis not present

## 2017-01-19 DIAGNOSIS — M542 Cervicalgia: Secondary | ICD-10-CM | POA: Diagnosis not present

## 2017-01-19 DIAGNOSIS — Z6825 Body mass index (BMI) 25.0-25.9, adult: Secondary | ICD-10-CM | POA: Diagnosis not present

## 2017-01-19 DIAGNOSIS — M1712 Unilateral primary osteoarthritis, left knee: Secondary | ICD-10-CM | POA: Diagnosis not present

## 2017-01-19 DIAGNOSIS — Z950 Presence of cardiac pacemaker: Secondary | ICD-10-CM | POA: Diagnosis not present

## 2017-01-19 DIAGNOSIS — I7389 Other specified peripheral vascular diseases: Secondary | ICD-10-CM | POA: Diagnosis not present

## 2017-01-19 DIAGNOSIS — I872 Venous insufficiency (chronic) (peripheral): Secondary | ICD-10-CM | POA: Diagnosis not present

## 2017-01-19 DIAGNOSIS — R2689 Other abnormalities of gait and mobility: Secondary | ICD-10-CM | POA: Diagnosis not present

## 2017-01-27 ENCOUNTER — Telehealth: Payer: Self-pay | Admitting: Internal Medicine

## 2017-01-27 DIAGNOSIS — I340 Nonrheumatic mitral (valve) insufficiency: Secondary | ICD-10-CM | POA: Diagnosis not present

## 2017-01-27 DIAGNOSIS — E039 Hypothyroidism, unspecified: Secondary | ICD-10-CM | POA: Diagnosis not present

## 2017-01-27 DIAGNOSIS — Z7901 Long term (current) use of anticoagulants: Secondary | ICD-10-CM | POA: Diagnosis not present

## 2017-01-27 DIAGNOSIS — I872 Venous insufficiency (chronic) (peripheral): Secondary | ICD-10-CM | POA: Diagnosis not present

## 2017-01-27 DIAGNOSIS — R0602 Shortness of breath: Secondary | ICD-10-CM | POA: Diagnosis not present

## 2017-01-27 DIAGNOSIS — Z950 Presence of cardiac pacemaker: Secondary | ICD-10-CM | POA: Diagnosis not present

## 2017-01-27 DIAGNOSIS — I482 Chronic atrial fibrillation: Secondary | ICD-10-CM | POA: Diagnosis not present

## 2017-01-27 DIAGNOSIS — E785 Hyperlipidemia, unspecified: Secondary | ICD-10-CM | POA: Diagnosis not present

## 2017-01-27 DIAGNOSIS — I951 Orthostatic hypotension: Secondary | ICD-10-CM | POA: Diagnosis not present

## 2017-01-27 DIAGNOSIS — E663 Overweight: Secondary | ICD-10-CM | POA: Diagnosis not present

## 2017-01-27 DIAGNOSIS — R42 Dizziness and giddiness: Secondary | ICD-10-CM | POA: Diagnosis not present

## 2017-01-27 NOTE — Telephone Encounter (Signed)
01-27-17 :  Can one of you see Mrs. [name] for a generator change? Dr. Doreatha Lew replaced generator 2011 DOB 2030/05/03.   Medtronic. She is on warfarin. Chronic a fib. Looked like hit eri on Sept 9th and outputs are higher on current interrogation also.   Thanks,   Tollie Eth   Scheduled 01-31-17 at [place] with Brittainy for pre op H&P-needs CBC-BMET-PT/INR-Change out planned for 02-02-17  Lorenda Hatchet

## 2017-01-28 ENCOUNTER — Encounter: Payer: Self-pay | Admitting: Cardiology

## 2017-01-28 DIAGNOSIS — I872 Venous insufficiency (chronic) (peripheral): Secondary | ICD-10-CM

## 2017-01-28 DIAGNOSIS — Z950 Presence of cardiac pacemaker: Secondary | ICD-10-CM

## 2017-01-28 DIAGNOSIS — Z7901 Long term (current) use of anticoagulants: Secondary | ICD-10-CM | POA: Insufficient documentation

## 2017-01-28 DIAGNOSIS — I482 Chronic atrial fibrillation, unspecified: Secondary | ICD-10-CM | POA: Insufficient documentation

## 2017-01-28 HISTORY — DX: Chronic atrial fibrillation, unspecified: I48.20

## 2017-01-28 HISTORY — DX: Presence of cardiac pacemaker: Z95.0

## 2017-01-28 HISTORY — DX: Venous insufficiency (chronic) (peripheral): I87.2

## 2017-01-28 HISTORY — DX: Long term (current) use of anticoagulants: Z79.01

## 2017-01-28 NOTE — Telephone Encounter (Signed)
New Message  Brandy from Dr. Thurman Coyer office call requesting to speak with RN to inform that will need a generator change. Please call back to discuss

## 2017-01-28 NOTE — Telephone Encounter (Signed)
Left message for Brandy at Dr Thurman Coyer office pt has been scheduled for H&P and lab work on 11/12 and generator change for 11/14.  Requested she c/b if further questions or concerns.

## 2017-01-28 NOTE — Telephone Encounter (Signed)
Call placed to Pt.  Pt agreeable to appt for gen change 02/02/2017 @ 1430 with Dr. Caryl Comes.  Notified Pt I would meet with her on Monday to go over instructions.  Pt concerned about discharging same day d/t she lives alone and is quite feeble.  Will notify Dr. Caryl Comes.

## 2017-01-31 ENCOUNTER — Encounter (INDEPENDENT_AMBULATORY_CARE_PROVIDER_SITE_OTHER): Payer: Self-pay

## 2017-01-31 ENCOUNTER — Encounter: Payer: Self-pay | Admitting: Cardiology

## 2017-01-31 ENCOUNTER — Ambulatory Visit (INDEPENDENT_AMBULATORY_CARE_PROVIDER_SITE_OTHER): Payer: Medicare Other | Admitting: Cardiology

## 2017-01-31 ENCOUNTER — Other Ambulatory Visit: Payer: Medicare Other

## 2017-01-31 VITALS — BP 160/82 | HR 65 | Resp 16 | Ht 62.0 in | Wt 148.0 lb

## 2017-01-31 DIAGNOSIS — Z01812 Encounter for preprocedural laboratory examination: Secondary | ICD-10-CM

## 2017-01-31 DIAGNOSIS — I495 Sick sinus syndrome: Secondary | ICD-10-CM | POA: Diagnosis not present

## 2017-01-31 LAB — BASIC METABOLIC PANEL
BUN / CREAT RATIO: 18 (ref 12–28)
BUN: 17 mg/dL (ref 8–27)
CHLORIDE: 96 mmol/L (ref 96–106)
CO2: 25 mmol/L (ref 20–29)
Calcium: 9.4 mg/dL (ref 8.7–10.3)
Creatinine, Ser: 0.93 mg/dL (ref 0.57–1.00)
GFR calc non Af Amer: 57 mL/min/{1.73_m2} — ABNORMAL LOW (ref >59)
GFR, EST AFRICAN AMERICAN: 65 mL/min/{1.73_m2} (ref >59)
Glucose: 103 mg/dL — ABNORMAL HIGH (ref 65–99)
POTASSIUM: 4.6 mmol/L (ref 3.5–5.2)
SODIUM: 136 mmol/L (ref 134–144)

## 2017-01-31 LAB — CBC WITH DIFFERENTIAL/PLATELET
Basophils Absolute: 0.1 10*3/uL (ref 0.0–0.2)
Basos: 1 %
EOS (ABSOLUTE): 0.2 10*3/uL (ref 0.0–0.4)
Eos: 2 %
Hematocrit: 40.9 % (ref 34.0–46.6)
Hemoglobin: 13.8 g/dL (ref 11.1–15.9)
Immature Grans (Abs): 0 10*3/uL (ref 0.0–0.1)
Immature Granulocytes: 0 %
LYMPHS ABS: 1.1 10*3/uL (ref 0.7–3.1)
Lymphs: 14 %
MCH: 29.3 pg (ref 26.6–33.0)
MCHC: 33.7 g/dL (ref 31.5–35.7)
MCV: 87 fL (ref 79–97)
MONOS ABS: 0.9 10*3/uL (ref 0.1–0.9)
Monocytes: 11 %
NEUTROS ABS: 5.9 10*3/uL (ref 1.4–7.0)
Neutrophils: 72 %
PLATELETS: 259 10*3/uL (ref 150–379)
RBC: 4.71 x10E6/uL (ref 3.77–5.28)
RDW: 14.4 % (ref 12.3–15.4)
WBC: 8.1 10*3/uL (ref 3.4–10.8)

## 2017-01-31 LAB — PROTIME-INR
INR: 2.1 — ABNORMAL HIGH (ref 0.8–1.2)
Prothrombin Time: 20.7 s — ABNORMAL HIGH (ref 9.1–12.0)

## 2017-01-31 NOTE — Patient Instructions (Addendum)
Medication Instructions:  Your physician recommends that you continue on your current medications as directed. Please refer to the Current Medication list given to you today.  Labwork: You will get a CBC, BMP and a PT/INR drawn today.  Testing/Procedures: You are scheduled for a generator change for your Medtronic device.  Follow-Up: You will follow up with the device clinic 10-14 days after your procedure for a wound check.  You will follow up with Dr. Caryl Comes 91 days after your procedure.  Any Other Special Instructions Will Be Listed Below (If Applicable).  See instruction letter for surgical instructions.   If you need a refill on your cardiac medications before your next appointment, please call your pharmacy.   Pacemaker Battery Change A pacemaker battery usually lasts 5-15 years (6-7 years on average). A few times a year, you will be asked to visit your health care provider to have a full evaluation of your pacemaker. When the battery is low, your pacemaker battery and generator will be completely replaced. Most often, this procedure is simpler than the first surgery because the wires (leads) that connect the generator to the heart are already in place. There are many things that affect how long a pacemaker battery will last, including:  The age of the pacemaker.  The number of leads you have(1, 2, or 3).  The pacemaker workload. If the pacemaker is helping the heart more often, the battery will not last as long.  Power (voltage) settings.  Tell a health care provider about:  Any allergies you have.  All medicines you are taking, including vitamins, herbs, eye drops, creams, and over-the-counter medicines.  Any problems you or family members have had with anesthetic medicines.  Any blood disorders you have.  Any surgeries you have had, especially the surgeries you have had since your last pacemaker was placed.  Any medical conditions you have.  Whether you are  pregnant or may be pregnant.  Any symptoms of heart problems, such as chest pain, trouble breathing, palpitations, light-headedness, or feelings of an abnormal or irregular heartbeat.  Smoking habits. This can affect your reaction to anesthesia. What are the risks? Generally, this is a safe procedure. However, problems may occur, including:  Bleeding.  Bruising of the skin around where the surgical cut (incision) was made.  Pulling apart of the skin at the incision site.  Infection.  Nerve damage.  Injury to other organs, such as the lungs.  Allergic reaction to anesthetics or other medicines used during the procedure.  People with diabetes may have a temporary increase in blood sugar (glucose) after any surgical procedure.  What happens before the procedure? Staying hydrated Follow instructions from your health care provider about hydration, which may include:  Up to 2 hours before the procedure - you may continue to drink clear liquids, such as water, clear fruit juice, black coffee, and plain tea.  Eating and drinking restrictions Follow instructions from your health care provider about eating and drinking restrictions, which may include:  8 hours before the procedure - stop eating heavy meals or foods such as meat, fried foods, or fatty foods.  6 hours before the procedure - stop eating light meals or foods, such as toast or cereal.  6 hours before the procedure - stop drinking milk or drinks that contain milk.  2 hours before the procedure - stop drinking clear liquids.  General instructions  Ask your health care provider about: ? Changing or stopping your regular medicines. This is especially important  if you are taking diabetes medicines or blood thinners. ? Taking medicines such as aspirin and ibuprofen. These medicines can thin your blood. Do not take these medicines before your procedure if your health care provider instructs you not to. ? Taking a sip of water  with any approved medicines on the morning of the procedure.  Plan to have someone take you home after the procedure. What happens during the procedure?  To reduce your risk of infection: ? Your health care team will wash or sanitize their hands. ? The skin around the area of the chest will be washed with soap. ? Hair may be removed from the surgical area.  An IV tube will be inserted into one your veins to give you medicine and fluids.  You will be given one or more of the following: ? A medicine to help you relax (sedative). ? A medicine to numb the area where the pacemaker is located (local anesthetic).  You may be given antibiotic medicine to prevent infection.  Your health care provider will make an incision to reopen the pocket holding the pacemaker.  The old pacemaker will be disconnected from the leads.  The leads will be tested.  If needed, the leads will be replaced. If the leads are functioning properly, the new pacemaker will be connected to the existing leads.  A heart monitor and the pacemaker programmer will be used to make sure that the newly implanted pacemaker is working properly.  The incision site will be closed. A bandage (dressing) will be placed over the pacemaker site. The procedure may vary among health care providers and hospitals. What happens after the procedure?  Your blood pressure, heart rate, breathing rate, and blood oxygen level will be monitored until your health care team is satisfied that your pacemaker is working properly.  Your health care provider will tell you when your pacemaker will need to be tested again, or when to return to the office for removal of dressing and stitches.  Do not drive for 24 hours if you were given a sedative.  The dressing will be removed 24-48 hours after the procedure, or as told by your health care provider. Summary  A pacemaker battery usually lasts 5-15 years (6-7 years on average).  When the battery is  low, your pacemaker battery and generator will be completely replaced.  Risks of this procedure include bleeding, bruising, infection, damage to other structures, pulling apart of the skin at the incision site, and allergic reactions to medicines or anesthetics.  Most often, this procedure is simpler than the first surgery because the wires (leads) that connect the generator to the heart are already in place. This information is not intended to replace advice given to you by your health care provider. Make sure you discuss any questions you have with your health care provider. Document Released: 06/16/2006 Document Revised: 02/10/2016 Document Reviewed: 02/10/2016 Elsevier Interactive Patient Education  2017 [employer].

## 2017-01-31 NOTE — H&P (View-Only) (Signed)
01/31/2017 [name]   05-09-1930  [id number]  Primary Physician Burnard Bunting, MD Primary Cardiologist: Dr. Wynonia Lawman  Electrophysiologist: New (Dr. Caryl Comes)   Reason for Visit/CC: Pacemaker ERI   HPI:  [name] is a 83 y.o. female who is being seen today as a new patient for evaluation for PPM ERI of at the request of Dr. Wynonia Lawman.  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 Dr. Doreatha Lew preformed a gen change out several years ago. She has now reached ERI again and has been referred by Dr. Wynonia Lawman. She has PAF and is on chronic coumadin for a/c. No other cardiac issues. She has h/o HTN but med's were discontinued in the past due to orthostatic hypotension.   She is here with her daughter. No complaints. BP is elevated. Initial BP was 194/96. BP was rechecked several min later and improved down to 160/82. Pt eats a high sodium diet (frozen dinner's nightly for super). She also drinks a moderate amount of caffeine.  Her PPM was checked by Medtronic rep in clinic today. Interrogation confirms she is at [facility]. She is currently not pacer dependent. Underlying rhythm is afib.   Current Meds  Medication Sig  . acetaminophen (TYLENOL) 500 MG tablet Take 1,000 mg every 8 (eight) hours as needed by mouth for mild pain or moderate pain.  . Bepotastine Besilate (BEPREVE OP) Place 1 drop 2 (two) times daily as needed into both eyes (eye allergies).  . celecoxib (CELEBREX) 200 MG capsule Take 200 mg at bedtime by mouth.   . cholecalciferol (VITAMIN D) 1000 units tablet Take 1,000 Units daily by mouth.  . digoxin (LANOXIN) 0.125 MG tablet Take 0.125 mg every morning by mouth.  . diphenhydrAMINE (BENADRYL) 25 MG tablet Take 50 mg by mouth every 6 (six) hours as needed. Allergic reaction  . docusate sodium (COLACE) 100 MG capsule Take 100 mg daily as needed by mouth for mild constipation.  . fluticasone (FLONASE) 50 MCG/ACT nasal spray Place 1 spray daily  as needed into both nostrils for allergies or rhinitis.  Marland Kitchen levothyroxine (SYNTHROID, LEVOTHROID) 125 MCG tablet Take 125 mcg at bedtime by mouth.   . oxybutynin (DITROPAN) 5 MG tablet Take 5 mg daily by mouth.   . sertraline (ZOLOFT) 50 MG tablet Take 25 mg by mouth daily.  . vitamin B-12 (CYANOCOBALAMIN) 1000 MCG tablet Take 1,000 mcg daily by mouth.  . warfarin (COUMADIN) 4 MG tablet Take 4 mg at bedtime by mouth.    Allergies  Allergen Reactions  . Penicillins Hives and Rash    Has patient had a PCN reaction causing immediate rash, facial/tongue/throat swelling, SOB or lightheadedness with hypotension: unkn Has patient had a PCN reaction causing severe rash involving mucus membranes or skin necrosis: unkn Has patient had a PCN reaction that required hospitalization: unkn Has patient had a PCN reaction occurring within the last 10 years: unkn If all of the above answers are "NO", then may proceed with Cephalosporin use.    Past Medical History:  Diagnosis Date  . Arrhythmia    chronic atrial fib  . Atrial fibrillation, chronic ([place])   . Chronic anticoagulation   . Chronic atrial fibrillation ([place]) 01/28/2017  . Chronic venous insufficiency 01/28/2017  . Long term current use of anticoagulant therapy 01/28/2017  . Osteoarthritis   . Presence of permanent cardiac pacemaker 01/28/2017   Original implant 1992 Lead Intermedics 431-04 Generator change 2001 and 2011.  Medtronic generator.   Marland Kitchen  SSS (sick sinus syndrome) ([place])   . Thyroid disease    hypothyroidism   History reviewed. No pertinent family history. Past Surgical History:  Procedure Laterality Date  . ABDOMINAL HYSTERECTOMY    . INSERT / REPLACE / REMOVE PACEMAKER     generator change 2011 medtronic sigma  . KNEE SURGERY    . PACEMAKER INSERTION    . REPLACEMENT TOTAL KNEE BILATERAL    . VARICOSE VEIN SURGERY     Social History   Socioeconomic History  . Marital status: Widowed    Spouse name: Not on file  . Number  of children: Not on file  . Years of education: Not on file  . Highest education level: Not on file  Social Needs  . Financial resource strain: Not on file  . Food insecurity - worry: Not on file  . Food insecurity - inability: Not on file  . Transportation needs - medical: Not on file  . Transportation needs - non-medical: Not on file  Occupational History  . Not on file  Tobacco Use  . Smoking status: Never Smoker  . Smokeless tobacco: Never Used  Substance and Sexual Activity  . Alcohol use: No  . Drug use: No  . Sexual activity: No  Other Topics Concern  . Not on file  Social History Narrative  . Not on file     Review of Systems: General: negative for chills, fever, night sweats or weight changes.  Cardiovascular: negative for chest pain, dyspnea on exertion, edema, orthopnea, palpitations, paroxysmal nocturnal dyspnea or shortness of breath Dermatological: negative for rash Respiratory: negative for cough or wheezing Urologic: negative for hematuria Abdominal: negative for nausea, vomiting, diarrhea, bright red blood per rectum, melena, or hematemesis Neurologic: negative for visual changes, syncope, or dizziness All other systems reviewed and are otherwise negative except as noted above.   Physical Exam:  Blood pressure (!) 160/82, pulse 65, resp. rate 16, height 5\' 2"  (1.575 m), weight 148 lb (67.1 kg), SpO2 98 %.  General appearance: alert, cooperative and no distress Neck: no carotid bruit and no JVD Lungs: clear to auscultation bilaterally Heart: regular rate and rhythm, S1, S2 normal, no murmur, click, rub or gallop Extremities: extremities normal, atraumatic, no cyanosis or edema Pulses: 2+ and symmetric Skin: Skin color, texture, turgor normal. No rashes or lesions Neurologic: Grossly normal  EKG atrial sensed ventricular paced rhythm 65 bpm -- personally reviewed   ASSESSMENT AND PLAN:   1. PPM: implanted for symptomatic bradycardia 26 years ago. Last  gen change was by Dr. Doreatha Lew. She has reached ERI again. Dr. Caryl Comes is aware and is scheduled to perform gen change 02/02/17. I've also discussed with DOD, Dr. Rayann Heman. Pt was given pre procedure instructions. Will obtain labs today.   2. PAF: rate is controlled. On chronic coumadin for a/c.   3. HTN: followed by Dr. Wynonia Lawman. Pt not on medications given issues with orthostatic hypotension in the past. BP is elevated today however pt notes diet high in salt and moderate caffeine consumption. Pt typically eats frozen dinners for her evening meal several nights a week. Pt encouraged to reduce consumption of these types of meals and to f/u with Dr. Wynonia Lawman for further management.    Follow-Up in EP clinic post gen change.   [name], MHS [facility] 01/31/2017 12:14 PM

## 2017-01-31 NOTE — Progress Notes (Signed)
01/31/2017 [name]   [birth year]  [id number]  Primary Physician Burnard Bunting, MD Primary Cardiologist: Dr. Wynonia Lawman  Electrophysiologist: New (Dr. Caryl Comes)   Reason for Visit/CC: Pacemaker ERI   HPI:  [name] is a 83 y.o. female who is being seen today as a new patient for evaluation for PPM ERI of at the request of Dr. Wynonia Lawman.  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 Dr. Doreatha Lew preformed a gen change out several years ago. She has now reached ERI again and has been referred by Dr. Wynonia Lawman. She has PAF and is on chronic coumadin for a/c. No other cardiac issues. She has h/o HTN but med's were discontinued in the past due to orthostatic hypotension.   She is here with her daughter. No complaints. BP is elevated. Initial BP was 194/96. BP was rechecked several min later and improved down to 160/82. Pt eats a high sodium diet (frozen dinner's nightly for super). She also drinks a moderate amount of caffeine.  Her PPM was checked by Medtronic rep in clinic today. Interrogation confirms she is at [facility]. She is currently not pacer dependent. Underlying rhythm is afib.   Current Meds  Medication Sig  . acetaminophen (TYLENOL) 500 MG tablet Take 1,000 mg every 8 (eight) hours as needed by mouth for mild pain or moderate pain.  . Bepotastine Besilate (BEPREVE OP) Place 1 drop 2 (two) times daily as needed into both eyes (eye allergies).  . celecoxib (CELEBREX) 200 MG capsule Take 200 mg at bedtime by mouth.   . cholecalciferol (VITAMIN D) 1000 units tablet Take 1,000 Units daily by mouth.  . digoxin (LANOXIN) 0.125 MG tablet Take 0.125 mg every morning by mouth.  . diphenhydrAMINE (BENADRYL) 25 MG tablet Take 50 mg by mouth every 6 (six) hours as needed. Allergic reaction  . docusate sodium (COLACE) 100 MG capsule Take 100 mg daily as needed by mouth for mild constipation.  . fluticasone (FLONASE) 50 MCG/ACT nasal spray Place 1 spray daily  as needed into both nostrils for allergies or rhinitis.  Marland Kitchen levothyroxine (SYNTHROID, LEVOTHROID) 125 MCG tablet Take 125 mcg at bedtime by mouth.   . oxybutynin (DITROPAN) 5 MG tablet Take 5 mg daily by mouth.   . sertraline (ZOLOFT) 50 MG tablet Take 25 mg by mouth daily.  . vitamin B-12 (CYANOCOBALAMIN) 1000 MCG tablet Take 1,000 mcg daily by mouth.  . warfarin (COUMADIN) 4 MG tablet Take 4 mg at bedtime by mouth.    Allergies  Allergen Reactions  . Penicillins Hives and Rash    Has patient had a PCN reaction causing immediate rash, facial/tongue/throat swelling, SOB or lightheadedness with hypotension: unkn Has patient had a PCN reaction causing severe rash involving mucus membranes or skin necrosis: unkn Has patient had a PCN reaction that required hospitalization: unkn Has patient had a PCN reaction occurring within the last 10 years: unkn If all of the above answers are "NO", then may proceed with Cephalosporin use.    Past Medical History:  Diagnosis Date  . Arrhythmia    chronic atrial fib  . Atrial fibrillation, chronic ([place])   . Chronic anticoagulation   . Chronic atrial fibrillation ([place]) 01/28/2017  . Chronic venous insufficiency 01/28/2017  . Long term current use of anticoagulant therapy 01/28/2017  . Osteoarthritis   . Presence of permanent cardiac pacemaker 01/28/2017   Original implant 1992 Lead Intermedics 431-04 Generator change 2001 and 2011.  Medtronic generator.   Marland Kitchen  SSS (sick sinus syndrome) ([place])   . Thyroid disease    hypothyroidism   History reviewed. No pertinent family history. Past Surgical History:  Procedure Laterality Date  . ABDOMINAL HYSTERECTOMY    . INSERT / REPLACE / REMOVE PACEMAKER     generator change 2011 medtronic sigma  . KNEE SURGERY    . PACEMAKER INSERTION    . REPLACEMENT TOTAL KNEE BILATERAL    . VARICOSE VEIN SURGERY     Social History   Socioeconomic History  . Marital status: Widowed    Spouse name: Not on file  . Number  of children: Not on file  . Years of education: Not on file  . Highest education level: Not on file  Social Needs  . Financial resource strain: Not on file  . Food insecurity - worry: Not on file  . Food insecurity - inability: Not on file  . Transportation needs - medical: Not on file  . Transportation needs - non-medical: Not on file  Occupational History  . Not on file  Tobacco Use  . Smoking status: Never Smoker  . Smokeless tobacco: Never Used  Substance and Sexual Activity  . Alcohol use: No  . Drug use: No  . Sexual activity: No  Other Topics Concern  . Not on file  Social History Narrative  . Not on file     Review of Systems: General: negative for chills, fever, night sweats or weight changes.  Cardiovascular: negative for chest pain, dyspnea on exertion, edema, orthopnea, palpitations, paroxysmal nocturnal dyspnea or shortness of breath Dermatological: negative for rash Respiratory: negative for cough or wheezing Urologic: negative for hematuria Abdominal: negative for nausea, vomiting, diarrhea, bright red blood per rectum, melena, or hematemesis Neurologic: negative for visual changes, syncope, or dizziness All other systems reviewed and are otherwise negative except as noted above.   Physical Exam:  Blood pressure (!) 160/82, pulse 65, resp. rate 16, height 5\' 2"  (1.575 m), weight 148 lb (67.1 kg), SpO2 98 %.  General appearance: alert, cooperative and no distress Neck: no carotid bruit and no JVD Lungs: clear to auscultation bilaterally Heart: regular rate and rhythm, S1, S2 normal, no murmur, click, rub or gallop Extremities: extremities normal, atraumatic, no cyanosis or edema Pulses: 2+ and symmetric Skin: Skin color, texture, turgor normal. No rashes or lesions Neurologic: Grossly normal  EKG atrial sensed ventricular paced rhythm 65 bpm -- personally reviewed   ASSESSMENT AND PLAN:   1. PPM: implanted for symptomatic bradycardia 26 years ago. Last  gen change was by Dr. Doreatha Lew. She has reached ERI again. Dr. Caryl Comes is aware and is scheduled to perform gen change 02/02/17. I've also discussed with DOD, Dr. Rayann Heman. Pt was given pre procedure instructions. Will obtain labs today.   2. PAF: rate is controlled. On chronic coumadin for a/c.   3. HTN: followed by Dr. Wynonia Lawman. Pt not on medications given issues with orthostatic hypotension in the past. BP is elevated today however pt notes diet high in salt and moderate caffeine consumption. Pt typically eats frozen dinners for her evening meal several nights a week. Pt encouraged to reduce consumption of these types of meals and to f/u with Dr. Wynonia Lawman for further management.    Follow-Up in EP clinic post gen change.   [name], MHS [facility] 01/31/2017 12:14 PM

## 2017-02-02 ENCOUNTER — Encounter (HOSPITAL_COMMUNITY)
Admission: RE | Disposition: A | Discharge status: Home / Self Care | Payer: Self-pay | Source: Ambulatory Visit | Attending: Internal Medicine

## 2017-02-02 ENCOUNTER — Ambulatory Visit (HOSPITAL_COMMUNITY)
Admission: RE | Admit: 2017-02-02 | Discharge: 2017-02-02 | Disposition: A | Discharge status: Home / Self Care | Payer: Medicare Other | Source: Ambulatory Visit | Attending: Internal Medicine | Admitting: Internal Medicine

## 2017-02-02 DIAGNOSIS — I482 Chronic atrial fibrillation: Secondary | ICD-10-CM | POA: Insufficient documentation

## 2017-02-02 DIAGNOSIS — M1990 Unspecified osteoarthritis, unspecified site: Secondary | ICD-10-CM | POA: Insufficient documentation

## 2017-02-02 DIAGNOSIS — R001 Bradycardia, unspecified: Secondary | ICD-10-CM

## 2017-02-02 DIAGNOSIS — Z7901 Long term (current) use of anticoagulants: Secondary | ICD-10-CM | POA: Insufficient documentation

## 2017-02-02 DIAGNOSIS — E039 Hypothyroidism, unspecified: Secondary | ICD-10-CM | POA: Diagnosis not present

## 2017-02-02 DIAGNOSIS — I495 Sick sinus syndrome: Secondary | ICD-10-CM | POA: Insufficient documentation

## 2017-02-02 DIAGNOSIS — Z880 Allergy status to penicillin: Secondary | ICD-10-CM | POA: Insufficient documentation

## 2017-02-02 DIAGNOSIS — Z7951 Long term (current) use of inhaled steroids: Secondary | ICD-10-CM | POA: Diagnosis not present

## 2017-02-02 DIAGNOSIS — I872 Venous insufficiency (chronic) (peripheral): Secondary | ICD-10-CM | POA: Diagnosis not present

## 2017-02-02 DIAGNOSIS — Z45010 Encounter for checking and testing of cardiac pacemaker pulse generator [battery]: Secondary | ICD-10-CM | POA: Insufficient documentation

## 2017-02-02 DIAGNOSIS — I10 Essential (primary) hypertension: Secondary | ICD-10-CM | POA: Diagnosis not present

## 2017-02-02 HISTORY — PX: PPM GENERATOR CHANGEOUT: EP1233

## 2017-02-02 LAB — PROTIME-INR
INR: 2.32
Prothrombin Time: 25.3 seconds — ABNORMAL HIGH (ref 11.4–15.2)

## 2017-02-02 LAB — SURGICAL PCR SCREEN
MRSA, PCR: NEGATIVE
Staphylococcus aureus: NEGATIVE

## 2017-02-02 SURGERY — PPM GENERATOR CHANGEOUT

## 2017-02-02 MED ORDER — VANCOMYCIN HCL IN DEXTROSE 1-5 GM/200ML-% IV SOLN
INTRAVENOUS | Status: AC
  Filled 2017-02-02: qty 200

## 2017-02-02 MED ORDER — ACETAMINOPHEN 325 MG PO TABS: 325.0000 mg | ORAL_TABLET | ORAL | Status: DC | PRN

## 2017-02-02 MED ORDER — LIDOCAINE HCL (PF) 1 % IJ SOLN
INTRAMUSCULAR | Status: AC
  Filled 2017-02-02: qty 60

## 2017-02-02 MED ORDER — CHLORHEXIDINE GLUCONATE 4 % EX LIQD
60.0000 mL | Freq: Once | CUTANEOUS | Status: DC
  Filled 2017-02-02: qty 60

## 2017-02-02 MED ORDER — SODIUM CHLORIDE 0.9 % IV SOLN
INTRAVENOUS | Status: DC
  Administered 2017-02-02: 14:00:00 via INTRAVENOUS

## 2017-02-02 MED ORDER — SODIUM CHLORIDE 0.9 % IV SOLN: INTRAVENOUS | Status: AC

## 2017-02-02 MED ORDER — LIDOCAINE HCL (PF) 1 % IJ SOLN
INTRAMUSCULAR | Status: DC | PRN
  Administered 2017-02-02: 45 mL

## 2017-02-02 MED ORDER — MUPIROCIN 2 % EX OINT
1.0000 "application " | TOPICAL_OINTMENT | Freq: Once | CUTANEOUS | Status: AC
  Administered 2017-02-02: 1 via TOPICAL
  Filled 2017-02-02: qty 22

## 2017-02-02 MED ORDER — ONDANSETRON HCL 4 MG/2ML IJ SOLN: 4.0000 mg | Freq: Four times a day (QID) | INTRAMUSCULAR | Status: DC | PRN

## 2017-02-02 MED ORDER — SODIUM CHLORIDE 0.9 % IR SOLN
80.0000 mg | Status: AC
  Administered 2017-02-02: 80 mg
  Filled 2017-02-02: qty 2

## 2017-02-02 MED ORDER — SODIUM CHLORIDE 0.9 % IR SOLN
Status: AC
  Filled 2017-02-02: qty 2

## 2017-02-02 MED ORDER — MUPIROCIN 2 % EX OINT
TOPICAL_OINTMENT | CUTANEOUS | Status: AC
  Administered 2017-02-02: 1 via TOPICAL
  Filled 2017-02-02: qty 22

## 2017-02-02 MED ORDER — VANCOMYCIN HCL IN DEXTROSE 1-5 GM/200ML-% IV SOLN
1000.0000 mg | INTRAVENOUS | Status: AC
  Administered 2017-02-02: 1000 mg via INTRAVENOUS
  Filled 2017-02-02: qty 200

## 2017-02-02 SURGICAL SUPPLY — 7 items
CABLE SURGICAL S-101-97-12 (CABLE) ×3 IMPLANT
HEMOSTAT SURGICEL 2X4 FIBR (HEMOSTASIS) ×3 IMPLANT
PACEMAKER ADAPTA SR ADSR01 (Pacemaker) ×1 IMPLANT
PAD DEFIB LIFELINK (PAD) ×3 IMPLANT
POUCH AIGIS-R ANTIBACT PPM (Mesh General) ×3 IMPLANT
PPM ADAPTA SR ADSR01 (Pacemaker) ×3 IMPLANT
TRAY PACEMAKER INSERTION (PACKS) ×3 IMPLANT

## 2017-02-02 NOTE — Interval H&P Note (Signed)
History and Physical Interval Note:  02/02/2017 2:01 PM  [name]  has presented today for surgery, with the diagnosis of ERI  The various methods of treatment have been discussed with the patient and family. After consideration of risks, benefits and other options for treatment, the patient has consented to  Procedure(s): PPM GENERATOR CHANGEOUT (N/A) as a surgical intervention .  The patient's history has been reviewed, patient examined, no change in status, stable for surgery.  I have reviewed the patient's chart and labs.  Questions were answered to the patient's satisfaction.     Virl Axe  Significant dyspnea and hopes taht pacemaker generator replacement will address this. Her echo 8/18 >> EF normal w hyperdynamic LV function, mod MR and mild AS Not likely to improve with pacing but she is also not dependent, and so her comments about doing nothing are not  Reasonable  Reviewed benefits and risks inclu but not limited to infection and lead fracture Her lead is old but functioning normally  Will not replace it

## 2017-02-02 NOTE — Discharge Instructions (Signed)

## 2017-02-03 ENCOUNTER — Encounter (HOSPITAL_COMMUNITY): Payer: Self-pay | Admitting: Internal Medicine

## 2017-02-14 DIAGNOSIS — I872 Venous insufficiency (chronic) (peripheral): Secondary | ICD-10-CM | POA: Diagnosis not present

## 2017-02-14 DIAGNOSIS — R0602 Shortness of breath: Secondary | ICD-10-CM | POA: Diagnosis not present

## 2017-02-14 DIAGNOSIS — Z950 Presence of cardiac pacemaker: Secondary | ICD-10-CM | POA: Diagnosis not present

## 2017-02-14 DIAGNOSIS — I340 Nonrheumatic mitral (valve) insufficiency: Secondary | ICD-10-CM | POA: Diagnosis not present

## 2017-02-14 DIAGNOSIS — E039 Hypothyroidism, unspecified: Secondary | ICD-10-CM | POA: Diagnosis not present

## 2017-02-14 DIAGNOSIS — E663 Overweight: Secondary | ICD-10-CM | POA: Diagnosis not present

## 2017-02-14 DIAGNOSIS — E785 Hyperlipidemia, unspecified: Secondary | ICD-10-CM | POA: Diagnosis not present

## 2017-02-14 DIAGNOSIS — I119 Hypertensive heart disease without heart failure: Secondary | ICD-10-CM | POA: Diagnosis not present

## 2017-02-14 DIAGNOSIS — R42 Dizziness and giddiness: Secondary | ICD-10-CM | POA: Diagnosis not present

## 2017-02-14 DIAGNOSIS — I951 Orthostatic hypotension: Secondary | ICD-10-CM | POA: Diagnosis not present

## 2017-02-14 DIAGNOSIS — I482 Chronic atrial fibrillation: Secondary | ICD-10-CM | POA: Diagnosis not present

## 2017-02-14 DIAGNOSIS — Z7901 Long term (current) use of anticoagulants: Secondary | ICD-10-CM | POA: Diagnosis not present

## 2017-02-15 ENCOUNTER — Ambulatory Visit (INDEPENDENT_AMBULATORY_CARE_PROVIDER_SITE_OTHER): Payer: Medicare Other | Admitting: *Deleted

## 2017-02-15 DIAGNOSIS — R001 Bradycardia, unspecified: Secondary | ICD-10-CM | POA: Diagnosis not present

## 2017-02-15 LAB — CUP PACEART INCLINIC DEVICE CHECK
Implantable Lead Implant Date: 20181114
Lead Channel Pacing Threshold Amplitude: 1.625 V
Lead Channel Pacing Threshold Pulse Width: 0.4 ms
Lead Channel Sensing Intrinsic Amplitude: 4 mV
Lead Channel Setting Pacing Pulse Width: 1 ms
MDC IDC LEAD LOCATION: 753860
MDC IDC MSMT BATTERY IMPEDANCE: 100 Ohm
MDC IDC MSMT BATTERY REMAINING LONGEVITY: 81 mo
MDC IDC MSMT BATTERY VOLTAGE: 2.79 V
MDC IDC MSMT LEADCHNL RA IMPEDANCE VALUE: 0 Ohm
MDC IDC MSMT LEADCHNL RV IMPEDANCE VALUE: 337 Ohm
MDC IDC MSMT LEADCHNL RV PACING THRESHOLD AMPLITUDE: 1.5 V
MDC IDC MSMT LEADCHNL RV PACING THRESHOLD PULSEWIDTH: 0.4 ms
MDC IDC PG IMPLANT DT: 20181114
MDC IDC SESS DTM: 20181127190804
MDC IDC SET LEADCHNL RV PACING AMPLITUDE: 2.5 V
MDC IDC SET LEADCHNL RV SENSING SENSITIVITY: 2 mV
MDC IDC STAT BRADY RV PERCENT PACED: 99 %

## 2017-02-16 NOTE — Progress Notes (Signed)
Wound check appointment. Steri-strips removed. Wound without redness or edema. Incision edges approximated, wound well healed. Normal device function. Thresholds, sensing, and impedances consistent with implant measurements. Device programmed at chronic values. Histogram distribution appropriate for patient and level of activity. No high ventricular rates noted. Patient educated about wound care, arm mobility. ROV 05/10/17

## 2017-03-03 DIAGNOSIS — G5601 Carpal tunnel syndrome, right upper limb: Secondary | ICD-10-CM | POA: Diagnosis not present

## 2017-03-03 DIAGNOSIS — G5602 Carpal tunnel syndrome, left upper limb: Secondary | ICD-10-CM | POA: Diagnosis not present

## 2017-03-03 DIAGNOSIS — G5603 Carpal tunnel syndrome, bilateral upper limbs: Secondary | ICD-10-CM | POA: Diagnosis not present

## 2017-04-13 DIAGNOSIS — Z7901 Long term (current) use of anticoagulants: Secondary | ICD-10-CM | POA: Diagnosis not present

## 2017-04-13 DIAGNOSIS — I482 Chronic atrial fibrillation: Secondary | ICD-10-CM | POA: Diagnosis not present

## 2017-04-13 DIAGNOSIS — E663 Overweight: Secondary | ICD-10-CM | POA: Diagnosis not present

## 2017-04-13 DIAGNOSIS — I119 Hypertensive heart disease without heart failure: Secondary | ICD-10-CM | POA: Diagnosis not present

## 2017-04-13 DIAGNOSIS — I340 Nonrheumatic mitral (valve) insufficiency: Secondary | ICD-10-CM | POA: Diagnosis not present

## 2017-04-13 DIAGNOSIS — I951 Orthostatic hypotension: Secondary | ICD-10-CM | POA: Diagnosis not present

## 2017-04-13 DIAGNOSIS — Z950 Presence of cardiac pacemaker: Secondary | ICD-10-CM | POA: Diagnosis not present

## 2017-04-13 DIAGNOSIS — E039 Hypothyroidism, unspecified: Secondary | ICD-10-CM | POA: Diagnosis not present

## 2017-04-13 DIAGNOSIS — I872 Venous insufficiency (chronic) (peripheral): Secondary | ICD-10-CM | POA: Diagnosis not present

## 2017-04-22 DIAGNOSIS — N816 Rectocele: Secondary | ICD-10-CM | POA: Diagnosis not present

## 2017-04-22 DIAGNOSIS — Z960 Presence of urogenital implants: Secondary | ICD-10-CM | POA: Diagnosis not present

## 2017-04-22 DIAGNOSIS — N8110 Cystocele, unspecified: Secondary | ICD-10-CM | POA: Diagnosis not present

## 2017-04-27 DIAGNOSIS — E039 Hypothyroidism, unspecified: Secondary | ICD-10-CM | POA: Diagnosis not present

## 2017-04-27 DIAGNOSIS — Z7901 Long term (current) use of anticoagulants: Secondary | ICD-10-CM | POA: Diagnosis not present

## 2017-04-27 DIAGNOSIS — R5383 Other fatigue: Secondary | ICD-10-CM | POA: Diagnosis not present

## 2017-04-27 DIAGNOSIS — I872 Venous insufficiency (chronic) (peripheral): Secondary | ICD-10-CM | POA: Diagnosis not present

## 2017-04-27 DIAGNOSIS — R634 Abnormal weight loss: Secondary | ICD-10-CM | POA: Diagnosis not present

## 2017-04-27 DIAGNOSIS — Z6825 Body mass index (BMI) 25.0-25.9, adult: Secondary | ICD-10-CM | POA: Diagnosis not present

## 2017-04-27 DIAGNOSIS — H02403 Unspecified ptosis of bilateral eyelids: Secondary | ICD-10-CM | POA: Diagnosis not present

## 2017-04-27 DIAGNOSIS — R1013 Epigastric pain: Secondary | ICD-10-CM | POA: Diagnosis not present

## 2017-04-27 DIAGNOSIS — R296 Repeated falls: Secondary | ICD-10-CM | POA: Diagnosis not present

## 2017-04-27 DIAGNOSIS — R2689 Other abnormalities of gait and mobility: Secondary | ICD-10-CM | POA: Diagnosis not present

## 2017-04-27 DIAGNOSIS — R531 Weakness: Secondary | ICD-10-CM | POA: Diagnosis not present

## 2017-04-27 DIAGNOSIS — I739 Peripheral vascular disease, unspecified: Secondary | ICD-10-CM | POA: Diagnosis not present

## 2017-04-27 DIAGNOSIS — Z950 Presence of cardiac pacemaker: Secondary | ICD-10-CM | POA: Diagnosis not present

## 2017-04-27 DIAGNOSIS — I951 Orthostatic hypotension: Secondary | ICD-10-CM | POA: Diagnosis not present

## 2017-04-27 DIAGNOSIS — E663 Overweight: Secondary | ICD-10-CM | POA: Diagnosis not present

## 2017-04-27 DIAGNOSIS — E7849 Other hyperlipidemia: Secondary | ICD-10-CM | POA: Diagnosis not present

## 2017-04-27 DIAGNOSIS — I340 Nonrheumatic mitral (valve) insufficiency: Secondary | ICD-10-CM | POA: Diagnosis not present

## 2017-04-27 DIAGNOSIS — I119 Hypertensive heart disease without heart failure: Secondary | ICD-10-CM | POA: Diagnosis not present

## 2017-04-27 DIAGNOSIS — I482 Chronic atrial fibrillation: Secondary | ICD-10-CM | POA: Diagnosis not present

## 2017-04-30 DIAGNOSIS — M545 Low back pain: Secondary | ICD-10-CM | POA: Diagnosis not present

## 2017-04-30 DIAGNOSIS — R531 Weakness: Secondary | ICD-10-CM | POA: Diagnosis not present

## 2017-04-30 DIAGNOSIS — I872 Venous insufficiency (chronic) (peripheral): Secondary | ICD-10-CM | POA: Diagnosis not present

## 2017-04-30 DIAGNOSIS — I4891 Unspecified atrial fibrillation: Secondary | ICD-10-CM | POA: Diagnosis not present

## 2017-04-30 DIAGNOSIS — Z9181 History of falling: Secondary | ICD-10-CM | POA: Diagnosis not present

## 2017-04-30 DIAGNOSIS — R2689 Other abnormalities of gait and mobility: Secondary | ICD-10-CM | POA: Diagnosis not present

## 2017-04-30 DIAGNOSIS — R296 Repeated falls: Secondary | ICD-10-CM | POA: Diagnosis not present

## 2017-04-30 DIAGNOSIS — R5383 Other fatigue: Secondary | ICD-10-CM | POA: Diagnosis not present

## 2017-04-30 DIAGNOSIS — E039 Hypothyroidism, unspecified: Secondary | ICD-10-CM | POA: Diagnosis not present

## 2017-04-30 DIAGNOSIS — Z7901 Long term (current) use of anticoagulants: Secondary | ICD-10-CM | POA: Diagnosis not present

## 2017-04-30 DIAGNOSIS — M1712 Unilateral primary osteoarthritis, left knee: Secondary | ICD-10-CM | POA: Diagnosis not present

## 2017-04-30 DIAGNOSIS — Z79899 Other long term (current) drug therapy: Secondary | ICD-10-CM | POA: Diagnosis not present

## 2017-04-30 DIAGNOSIS — Z950 Presence of cardiac pacemaker: Secondary | ICD-10-CM | POA: Diagnosis not present

## 2017-04-30 DIAGNOSIS — G629 Polyneuropathy, unspecified: Secondary | ICD-10-CM | POA: Diagnosis not present

## 2017-04-30 DIAGNOSIS — F329 Major depressive disorder, single episode, unspecified: Secondary | ICD-10-CM | POA: Diagnosis not present

## 2017-05-01 ENCOUNTER — Other Ambulatory Visit: Payer: Self-pay | Admitting: Internal Medicine

## 2017-05-01 DIAGNOSIS — R1013 Epigastric pain: Secondary | ICD-10-CM

## 2017-05-01 DIAGNOSIS — R634 Abnormal weight loss: Secondary | ICD-10-CM

## 2017-05-03 DIAGNOSIS — R2689 Other abnormalities of gait and mobility: Secondary | ICD-10-CM | POA: Diagnosis not present

## 2017-05-03 DIAGNOSIS — R296 Repeated falls: Secondary | ICD-10-CM | POA: Diagnosis not present

## 2017-05-03 DIAGNOSIS — R531 Weakness: Secondary | ICD-10-CM | POA: Diagnosis not present

## 2017-05-03 DIAGNOSIS — M1712 Unilateral primary osteoarthritis, left knee: Secondary | ICD-10-CM | POA: Diagnosis not present

## 2017-05-03 DIAGNOSIS — G629 Polyneuropathy, unspecified: Secondary | ICD-10-CM | POA: Diagnosis not present

## 2017-05-03 DIAGNOSIS — R5383 Other fatigue: Secondary | ICD-10-CM | POA: Diagnosis not present

## 2017-05-05 DIAGNOSIS — R5383 Other fatigue: Secondary | ICD-10-CM | POA: Diagnosis not present

## 2017-05-05 DIAGNOSIS — R296 Repeated falls: Secondary | ICD-10-CM | POA: Diagnosis not present

## 2017-05-05 DIAGNOSIS — R531 Weakness: Secondary | ICD-10-CM | POA: Diagnosis not present

## 2017-05-05 DIAGNOSIS — R2689 Other abnormalities of gait and mobility: Secondary | ICD-10-CM | POA: Diagnosis not present

## 2017-05-05 DIAGNOSIS — G629 Polyneuropathy, unspecified: Secondary | ICD-10-CM | POA: Diagnosis not present

## 2017-05-05 DIAGNOSIS — M1712 Unilateral primary osteoarthritis, left knee: Secondary | ICD-10-CM | POA: Diagnosis not present

## 2017-05-09 DIAGNOSIS — G629 Polyneuropathy, unspecified: Secondary | ICD-10-CM | POA: Diagnosis not present

## 2017-05-09 DIAGNOSIS — R296 Repeated falls: Secondary | ICD-10-CM | POA: Diagnosis not present

## 2017-05-09 DIAGNOSIS — R5383 Other fatigue: Secondary | ICD-10-CM | POA: Diagnosis not present

## 2017-05-09 DIAGNOSIS — M1712 Unilateral primary osteoarthritis, left knee: Secondary | ICD-10-CM | POA: Diagnosis not present

## 2017-05-09 DIAGNOSIS — R531 Weakness: Secondary | ICD-10-CM | POA: Diagnosis not present

## 2017-05-09 DIAGNOSIS — R2689 Other abnormalities of gait and mobility: Secondary | ICD-10-CM | POA: Diagnosis not present

## 2017-05-10 ENCOUNTER — Ambulatory Visit (INDEPENDENT_AMBULATORY_CARE_PROVIDER_SITE_OTHER): Payer: Medicare Other | Admitting: Internal Medicine

## 2017-05-10 ENCOUNTER — Encounter: Payer: Self-pay | Admitting: Internal Medicine

## 2017-05-10 VITALS — BP 138/74 | HR 78 | Ht 64.0 in | Wt 144.6 lb

## 2017-05-10 DIAGNOSIS — Z950 Presence of cardiac pacemaker: Secondary | ICD-10-CM

## 2017-05-10 DIAGNOSIS — I495 Sick sinus syndrome: Secondary | ICD-10-CM | POA: Diagnosis not present

## 2017-05-10 DIAGNOSIS — R001 Bradycardia, unspecified: Secondary | ICD-10-CM | POA: Diagnosis not present

## 2017-05-10 DIAGNOSIS — I482 Chronic atrial fibrillation, unspecified: Secondary | ICD-10-CM

## 2017-05-10 NOTE — Progress Notes (Signed)
.      Patient Care Team: Burnard Bunting, MD as PCP - General (Internal Medicine)   HPI  [name] is a 84 y.o. female SEEN IN followup the pacemaker originally implanted by Dr. Doreatha Lew 25 years ago.  She has a history of complete heart block paroxysmal atrial fibrillation.  He underwent device generator replacement 11/18.  She has done well since then.  Her energy has improved markedly (despite my misgivings)  Records and Results Reviewed   Past Medical History:  Diagnosis Date  . Arrhythmia    chronic atrial fib  . Atrial fibrillation, chronic ([place])   . Chronic anticoagulation   . Chronic atrial fibrillation ([place]) 01/28/2017  . Chronic venous insufficiency 01/28/2017  . Long term current use of anticoagulant therapy 01/28/2017  . Osteoarthritis   . Presence of permanent cardiac pacemaker 01/28/2017   Original implant 1992 Lead [employer]ics 431-04 Generator change 2001 and 2011.  Medtronic generator.   . SSS (sick sinus syndrome) ([place])   . Thyroid disease    hypothyroidism    Past Surgical History:  Procedure Laterality Date  . ABDOMINAL HYSTERECTOMY    . INSERT / REPLACE / REMOVE PACEMAKER     generator change 2011 medtronic sigma  . KNEE SURGERY    . PACEMAKER INSERTION    . PPM GENERATOR CHANGEOUT N/A 02/02/2017   Procedure: PPM GENERATOR CHANGEOUT;  Surgeon: Deboraha Sprang, MD;  Location: [facility];  Service: Cardiovascular;  Laterality: N/A;  . REPLACEMENT TOTAL KNEE BILATERAL    . VARICOSE VEIN SURGERY      Current Outpatient Medications  Medication Sig Dispense Refill  . acetaminophen (TYLENOL) 500 MG tablet Take 1,000 mg every 8 (eight) hours as needed by mouth for mild pain or moderate pain.    . Bepotastine Besilate (BEPREVE OP) Place 1 drop 2 (two) times daily as needed into both eyes (eye allergies).    . celecoxib (CELEBREX) 200 MG capsule Take 200 mg at bedtime by mouth.     . cholecalciferol (VITAMIN D) 1000 units tablet Take  1,000 Units daily by mouth.    . digoxin (LANOXIN) 0.125 MG tablet Take 0.125 mg every morning by mouth.    . diphenhydrAMINE (BENADRYL) 25 MG tablet Take 50 mg by mouth every 6 (six) hours as needed. Allergic reaction    . docusate sodium (COLACE) 100 MG capsule Take 100 mg daily as needed by mouth for mild constipation.    . fluticasone (FLONASE) 50 MCG/ACT nasal spray Place 1 spray daily as needed into both nostrils for allergies or rhinitis.    Marland Kitchen levothyroxine (SYNTHROID, LEVOTHROID) 125 MCG tablet Take 125 mcg at bedtime by mouth.     . oxybutynin (DITROPAN) 5 MG tablet Take 5 mg daily by mouth.     . sertraline (ZOLOFT) 50 MG tablet Take 25 mg by mouth daily.    . vitamin B-12 (CYANOCOBALAMIN) 1000 MCG tablet Take 1,000 mcg daily by mouth.    . warfarin (COUMADIN) 4 MG tablet Take 4 mg at bedtime by mouth.      No current facility-administered medications for this visit.     Allergies  Allergen Reactions  . Penicillins Hives and Rash    Has patient had a PCN reaction causing immediate rash, facial/tongue/throat swelling, SOB or lightheadedness with hypotension: unkn Has patient had a PCN reaction causing severe rash involving mucus membranes or skin necrosis: unkn Has patient had a PCN reaction that required hospitalization: unkn Has patient had  a PCN reaction occurring within the last 10 years: unkn If all of the above answers are "NO", then may proceed with Cephalosporin use.       Review of Systems negative except from HPI and PMH  Physical Exam BP 138/74   Pulse 78   Ht 5\' 4"  (1.626 m)   Wt 144 lb 9.6 oz (65.6 kg)   SpO2 99%   BMI 24.82 kg/m  Well developed and well nourished in no acute distress HENT normal E scleral and icterus clear Neck Supple JVP flat; carotids brisk and full Clear to ausculation Pocket without  hematoma, swelling or tenderness  Regular rate and rhythm, no murmurs gallops or rub Soft with active bowel sounds No clubbing cyanosis larger  than the right leg to mid calf edema; venous varicosities Alert and oriented, grossly normal motor and sensory function Skin Warm and Dry  ECG demonstrates atrial fibrillation with ventricular pacing  Assessment and  Plan  Complete heart block  Atrial fibrillation permanent  Left leg swelling  Pacemaker-Medtronic  The patient's device was interrogated.  The information was reviewed. No changes were made in the programming.     On Anticoagulation;  No bleeding issues   Her left leg swelling is unlikely to be due to a DVT; she is on chronic Coumadin.  This is however possible.  I suspect more likely that this is related to her prior knee surgeries and perhaps secondary venous insufficiency.  I will forward these records to Dr. Reynaldo Minium and defer further evaluation to him  We spent more than 50% of our >25 min visit in face to face counseling regarding the above       Current medicines are reviewed at length with the patient today .  The patient does not  have concerns regarding medicines.

## 2017-05-10 NOTE — Patient Instructions (Signed)
Medication Instructions:  Your physician recommends that you continue on your current medications as directed. Please refer to the Current Medication list given to you today.   Labwork: None ordered.  Testing/Procedures: None ordered.  Follow-Up: Your physician recommends that you schedule a follow-up appointment in: One Year with Dr Caryl Comes  Remote monitoring is used to monitor your Pacemaker from home. This monitoring reduces the number of office visits required to check your device to one time per year. It allows [place] to keep an eye on the functioning of your device to ensure it is working properly. You are scheduled for a device check from home on 08/09/2017. You may send your transmission at any time that day. If you have a wireless device, the transmission will be sent automatically. After your physician reviews your transmission, you will receive a postcard with your next transmission date.     Any Other Special Instructions Will Be Listed Below (If Applicable).     If you need a refill on your cardiac medications before your next appointment, please call your pharmacy.

## 2017-05-11 DIAGNOSIS — R5383 Other fatigue: Secondary | ICD-10-CM | POA: Diagnosis not present

## 2017-05-11 DIAGNOSIS — G629 Polyneuropathy, unspecified: Secondary | ICD-10-CM | POA: Diagnosis not present

## 2017-05-11 DIAGNOSIS — R296 Repeated falls: Secondary | ICD-10-CM | POA: Diagnosis not present

## 2017-05-11 DIAGNOSIS — R531 Weakness: Secondary | ICD-10-CM | POA: Diagnosis not present

## 2017-05-11 DIAGNOSIS — R2689 Other abnormalities of gait and mobility: Secondary | ICD-10-CM | POA: Diagnosis not present

## 2017-05-11 DIAGNOSIS — M1712 Unilateral primary osteoarthritis, left knee: Secondary | ICD-10-CM | POA: Diagnosis not present

## 2017-05-12 DIAGNOSIS — I482 Chronic atrial fibrillation: Secondary | ICD-10-CM | POA: Diagnosis not present

## 2017-05-12 DIAGNOSIS — E039 Hypothyroidism, unspecified: Secondary | ICD-10-CM | POA: Diagnosis not present

## 2017-05-12 DIAGNOSIS — I872 Venous insufficiency (chronic) (peripheral): Secondary | ICD-10-CM | POA: Diagnosis not present

## 2017-05-12 DIAGNOSIS — I340 Nonrheumatic mitral (valve) insufficiency: Secondary | ICD-10-CM | POA: Diagnosis not present

## 2017-05-12 DIAGNOSIS — E663 Overweight: Secondary | ICD-10-CM | POA: Diagnosis not present

## 2017-05-12 DIAGNOSIS — I951 Orthostatic hypotension: Secondary | ICD-10-CM | POA: Diagnosis not present

## 2017-05-12 DIAGNOSIS — I119 Hypertensive heart disease without heart failure: Secondary | ICD-10-CM | POA: Diagnosis not present

## 2017-05-12 DIAGNOSIS — Z950 Presence of cardiac pacemaker: Secondary | ICD-10-CM | POA: Diagnosis not present

## 2017-05-12 DIAGNOSIS — Z7901 Long term (current) use of anticoagulants: Secondary | ICD-10-CM | POA: Diagnosis not present

## 2017-05-16 DIAGNOSIS — M1712 Unilateral primary osteoarthritis, left knee: Secondary | ICD-10-CM | POA: Diagnosis not present

## 2017-05-16 DIAGNOSIS — R531 Weakness: Secondary | ICD-10-CM | POA: Diagnosis not present

## 2017-05-16 DIAGNOSIS — R2689 Other abnormalities of gait and mobility: Secondary | ICD-10-CM | POA: Diagnosis not present

## 2017-05-16 DIAGNOSIS — R5383 Other fatigue: Secondary | ICD-10-CM | POA: Diagnosis not present

## 2017-05-16 DIAGNOSIS — G629 Polyneuropathy, unspecified: Secondary | ICD-10-CM | POA: Diagnosis not present

## 2017-05-16 DIAGNOSIS — R296 Repeated falls: Secondary | ICD-10-CM | POA: Diagnosis not present

## 2017-05-16 LAB — CUP PACEART INCLINIC DEVICE CHECK
Battery Impedance: 110 Ohm
Battery Remaining Longevity: 76 mo
Battery Voltage: 2.78 V
Brady Statistic RV Percent Paced: 99 %
Date Time Interrogation Session: 20190219213445
Implantable Lead Location: 753860
Lead Channel Impedance Value: 0 Ohm
Lead Channel Impedance Value: 318 Ohm
Lead Channel Setting Pacing Amplitude: 2.5 V
Lead Channel Setting Pacing Pulse Width: 1 ms
Lead Channel Setting Sensing Sensitivity: 2 mV
MDC IDC LEAD IMPLANT DT: 20181114
MDC IDC MSMT LEADCHNL RV PACING THRESHOLD AMPLITUDE: 1.25 V
MDC IDC MSMT LEADCHNL RV PACING THRESHOLD PULSEWIDTH: 1 ms
MDC IDC MSMT LEADCHNL RV SENSING INTR AMPL: 4 mV
MDC IDC PG IMPLANT DT: 20181114

## 2017-05-18 DIAGNOSIS — R296 Repeated falls: Secondary | ICD-10-CM | POA: Diagnosis not present

## 2017-05-18 DIAGNOSIS — R2689 Other abnormalities of gait and mobility: Secondary | ICD-10-CM | POA: Diagnosis not present

## 2017-05-18 DIAGNOSIS — R5383 Other fatigue: Secondary | ICD-10-CM | POA: Diagnosis not present

## 2017-05-18 DIAGNOSIS — R531 Weakness: Secondary | ICD-10-CM | POA: Diagnosis not present

## 2017-05-18 DIAGNOSIS — G629 Polyneuropathy, unspecified: Secondary | ICD-10-CM | POA: Diagnosis not present

## 2017-05-18 DIAGNOSIS — M1712 Unilateral primary osteoarthritis, left knee: Secondary | ICD-10-CM | POA: Diagnosis not present

## 2017-05-23 DIAGNOSIS — R2689 Other abnormalities of gait and mobility: Secondary | ICD-10-CM | POA: Diagnosis not present

## 2017-05-23 DIAGNOSIS — G629 Polyneuropathy, unspecified: Secondary | ICD-10-CM | POA: Diagnosis not present

## 2017-05-23 DIAGNOSIS — R531 Weakness: Secondary | ICD-10-CM | POA: Diagnosis not present

## 2017-05-23 DIAGNOSIS — M1712 Unilateral primary osteoarthritis, left knee: Secondary | ICD-10-CM | POA: Diagnosis not present

## 2017-05-23 DIAGNOSIS — R5383 Other fatigue: Secondary | ICD-10-CM | POA: Diagnosis not present

## 2017-05-23 DIAGNOSIS — R296 Repeated falls: Secondary | ICD-10-CM | POA: Diagnosis not present

## 2017-05-25 DIAGNOSIS — M1712 Unilateral primary osteoarthritis, left knee: Secondary | ICD-10-CM | POA: Diagnosis not present

## 2017-05-25 DIAGNOSIS — G629 Polyneuropathy, unspecified: Secondary | ICD-10-CM | POA: Diagnosis not present

## 2017-05-25 DIAGNOSIS — R296 Repeated falls: Secondary | ICD-10-CM | POA: Diagnosis not present

## 2017-05-25 DIAGNOSIS — R5383 Other fatigue: Secondary | ICD-10-CM | POA: Diagnosis not present

## 2017-05-25 DIAGNOSIS — R531 Weakness: Secondary | ICD-10-CM | POA: Diagnosis not present

## 2017-05-25 DIAGNOSIS — R2689 Other abnormalities of gait and mobility: Secondary | ICD-10-CM | POA: Diagnosis not present

## 2017-06-14 DIAGNOSIS — Z950 Presence of cardiac pacemaker: Secondary | ICD-10-CM | POA: Diagnosis not present

## 2017-06-14 DIAGNOSIS — Z7901 Long term (current) use of anticoagulants: Secondary | ICD-10-CM | POA: Diagnosis not present

## 2017-06-14 DIAGNOSIS — I951 Orthostatic hypotension: Secondary | ICD-10-CM | POA: Diagnosis not present

## 2017-06-14 DIAGNOSIS — E663 Overweight: Secondary | ICD-10-CM | POA: Diagnosis not present

## 2017-06-14 DIAGNOSIS — I482 Chronic atrial fibrillation: Secondary | ICD-10-CM | POA: Diagnosis not present

## 2017-06-14 DIAGNOSIS — I119 Hypertensive heart disease without heart failure: Secondary | ICD-10-CM | POA: Diagnosis not present

## 2017-06-14 DIAGNOSIS — I340 Nonrheumatic mitral (valve) insufficiency: Secondary | ICD-10-CM | POA: Diagnosis not present

## 2017-06-14 DIAGNOSIS — I872 Venous insufficiency (chronic) (peripheral): Secondary | ICD-10-CM | POA: Diagnosis not present

## 2017-06-14 DIAGNOSIS — E039 Hypothyroidism, unspecified: Secondary | ICD-10-CM | POA: Diagnosis not present

## 2017-06-23 DIAGNOSIS — I872 Venous insufficiency (chronic) (peripheral): Secondary | ICD-10-CM | POA: Diagnosis not present

## 2017-06-23 DIAGNOSIS — I340 Nonrheumatic mitral (valve) insufficiency: Secondary | ICD-10-CM | POA: Diagnosis not present

## 2017-06-23 DIAGNOSIS — E663 Overweight: Secondary | ICD-10-CM | POA: Diagnosis not present

## 2017-06-23 DIAGNOSIS — Z7901 Long term (current) use of anticoagulants: Secondary | ICD-10-CM | POA: Diagnosis not present

## 2017-06-23 DIAGNOSIS — E039 Hypothyroidism, unspecified: Secondary | ICD-10-CM | POA: Diagnosis not present

## 2017-06-23 DIAGNOSIS — I482 Chronic atrial fibrillation: Secondary | ICD-10-CM | POA: Diagnosis not present

## 2017-06-23 DIAGNOSIS — Z950 Presence of cardiac pacemaker: Secondary | ICD-10-CM | POA: Diagnosis not present

## 2017-06-23 DIAGNOSIS — I119 Hypertensive heart disease without heart failure: Secondary | ICD-10-CM | POA: Diagnosis not present

## 2017-06-23 DIAGNOSIS — I951 Orthostatic hypotension: Secondary | ICD-10-CM | POA: Diagnosis not present

## 2017-07-15 DIAGNOSIS — E663 Overweight: Secondary | ICD-10-CM | POA: Diagnosis not present

## 2017-07-15 DIAGNOSIS — Z7901 Long term (current) use of anticoagulants: Secondary | ICD-10-CM | POA: Diagnosis not present

## 2017-07-15 DIAGNOSIS — I951 Orthostatic hypotension: Secondary | ICD-10-CM | POA: Diagnosis not present

## 2017-07-15 DIAGNOSIS — Z950 Presence of cardiac pacemaker: Secondary | ICD-10-CM | POA: Diagnosis not present

## 2017-07-15 DIAGNOSIS — I119 Hypertensive heart disease without heart failure: Secondary | ICD-10-CM | POA: Diagnosis not present

## 2017-07-15 DIAGNOSIS — I340 Nonrheumatic mitral (valve) insufficiency: Secondary | ICD-10-CM | POA: Diagnosis not present

## 2017-07-15 DIAGNOSIS — I872 Venous insufficiency (chronic) (peripheral): Secondary | ICD-10-CM | POA: Diagnosis not present

## 2017-07-15 DIAGNOSIS — E039 Hypothyroidism, unspecified: Secondary | ICD-10-CM | POA: Diagnosis not present

## 2017-07-15 DIAGNOSIS — I482 Chronic atrial fibrillation: Secondary | ICD-10-CM | POA: Diagnosis not present

## 2017-07-18 DIAGNOSIS — E038 Other specified hypothyroidism: Secondary | ICD-10-CM | POA: Diagnosis not present

## 2017-07-18 DIAGNOSIS — R82998 Other abnormal findings in urine: Secondary | ICD-10-CM | POA: Diagnosis not present

## 2017-07-18 DIAGNOSIS — I480 Paroxysmal atrial fibrillation: Secondary | ICD-10-CM | POA: Diagnosis not present

## 2017-07-18 DIAGNOSIS — E7849 Other hyperlipidemia: Secondary | ICD-10-CM | POA: Diagnosis not present

## 2017-07-25 DIAGNOSIS — Z1389 Encounter for screening for other disorder: Secondary | ICD-10-CM | POA: Diagnosis not present

## 2017-07-25 DIAGNOSIS — E7849 Other hyperlipidemia: Secondary | ICD-10-CM | POA: Diagnosis not present

## 2017-07-25 DIAGNOSIS — Z6825 Body mass index (BMI) 25.0-25.9, adult: Secondary | ICD-10-CM | POA: Diagnosis not present

## 2017-07-25 DIAGNOSIS — I739 Peripheral vascular disease, unspecified: Secondary | ICD-10-CM | POA: Diagnosis not present

## 2017-07-25 DIAGNOSIS — G629 Polyneuropathy, unspecified: Secondary | ICD-10-CM | POA: Diagnosis not present

## 2017-07-25 DIAGNOSIS — I872 Venous insufficiency (chronic) (peripheral): Secondary | ICD-10-CM | POA: Diagnosis not present

## 2017-07-25 DIAGNOSIS — Z950 Presence of cardiac pacemaker: Secondary | ICD-10-CM | POA: Diagnosis not present

## 2017-07-25 DIAGNOSIS — Z0000 Encounter for general adult medical examination without abnormal findings: Secondary | ICD-10-CM | POA: Diagnosis not present

## 2017-07-25 DIAGNOSIS — E038 Other specified hypothyroidism: Secondary | ICD-10-CM | POA: Diagnosis not present

## 2017-07-25 DIAGNOSIS — R2689 Other abnormalities of gait and mobility: Secondary | ICD-10-CM | POA: Diagnosis not present

## 2017-07-25 DIAGNOSIS — R296 Repeated falls: Secondary | ICD-10-CM | POA: Diagnosis not present

## 2017-07-25 DIAGNOSIS — D692 Other nonthrombocytopenic purpura: Secondary | ICD-10-CM | POA: Diagnosis not present

## 2017-08-02 DIAGNOSIS — G5602 Carpal tunnel syndrome, left upper limb: Secondary | ICD-10-CM | POA: Diagnosis not present

## 2017-08-02 DIAGNOSIS — G5601 Carpal tunnel syndrome, right upper limb: Secondary | ICD-10-CM | POA: Diagnosis not present

## 2017-08-03 DIAGNOSIS — Z1212 Encounter for screening for malignant neoplasm of rectum: Secondary | ICD-10-CM | POA: Diagnosis not present

## 2017-08-09 ENCOUNTER — Encounter: Payer: Medicare Other | Admitting: *Deleted

## 2017-08-09 ENCOUNTER — Telehealth: Payer: Self-pay | Admitting: Cardiology

## 2017-08-09 NOTE — Telephone Encounter (Signed)
LMOVM reminding pt to send remote transmission.   

## 2017-08-10 DIAGNOSIS — I872 Venous insufficiency (chronic) (peripheral): Secondary | ICD-10-CM | POA: Diagnosis not present

## 2017-08-10 DIAGNOSIS — I951 Orthostatic hypotension: Secondary | ICD-10-CM | POA: Diagnosis not present

## 2017-08-10 DIAGNOSIS — I482 Chronic atrial fibrillation: Secondary | ICD-10-CM | POA: Diagnosis not present

## 2017-08-10 DIAGNOSIS — Z950 Presence of cardiac pacemaker: Secondary | ICD-10-CM | POA: Diagnosis not present

## 2017-08-10 DIAGNOSIS — E039 Hypothyroidism, unspecified: Secondary | ICD-10-CM | POA: Diagnosis not present

## 2017-08-10 DIAGNOSIS — Z7901 Long term (current) use of anticoagulants: Secondary | ICD-10-CM | POA: Diagnosis not present

## 2017-08-10 DIAGNOSIS — I119 Hypertensive heart disease without heart failure: Secondary | ICD-10-CM | POA: Diagnosis not present

## 2017-08-10 DIAGNOSIS — E663 Overweight: Secondary | ICD-10-CM | POA: Diagnosis not present

## 2017-08-10 DIAGNOSIS — I340 Nonrheumatic mitral (valve) insufficiency: Secondary | ICD-10-CM | POA: Diagnosis not present

## 2017-08-11 ENCOUNTER — Encounter: Payer: Self-pay | Admitting: Cardiology

## 2017-08-19 ENCOUNTER — Telehealth: Payer: Self-pay | Admitting: Cardiology

## 2017-08-19 NOTE — Telephone Encounter (Signed)
Informed patient that she could disregard the letter that she received about her remote transmission. I told her that the remote appt was made in error since her remotes are followed by Dr.Tilley. Patient verbalized understanding.

## 2017-08-19 NOTE — Telephone Encounter (Signed)
New Message:   Please call,she said she received a letter.

## 2017-09-02 ENCOUNTER — Emergency Department (HOSPITAL_COMMUNITY): Payer: Medicare Other

## 2017-09-02 ENCOUNTER — Emergency Department (HOSPITAL_COMMUNITY)
Admission: EM | Admit: 2017-09-02 | Discharge: 2017-09-02 | Disposition: A | Discharge status: Home / Self Care | Payer: Medicare Other | Attending: Emergency Medicine | Admitting: Emergency Medicine

## 2017-09-02 DIAGNOSIS — N3000 Acute cystitis without hematuria: Secondary | ICD-10-CM | POA: Diagnosis not present

## 2017-09-02 DIAGNOSIS — S0093XA Contusion of unspecified part of head, initial encounter: Secondary | ICD-10-CM | POA: Diagnosis not present

## 2017-09-02 DIAGNOSIS — E039 Hypothyroidism, unspecified: Secondary | ICD-10-CM | POA: Insufficient documentation

## 2017-09-02 DIAGNOSIS — S0191XA Laceration without foreign body of unspecified part of head, initial encounter: Secondary | ICD-10-CM | POA: Diagnosis not present

## 2017-09-02 DIAGNOSIS — Z79899 Other long term (current) drug therapy: Secondary | ICD-10-CM | POA: Diagnosis not present

## 2017-09-02 DIAGNOSIS — W19XXXA Unspecified fall, initial encounter: Secondary | ICD-10-CM

## 2017-09-02 DIAGNOSIS — R51 Headache: Secondary | ICD-10-CM | POA: Diagnosis present

## 2017-09-02 DIAGNOSIS — S199XXA Unspecified injury of neck, initial encounter: Secondary | ICD-10-CM | POA: Diagnosis not present

## 2017-09-02 DIAGNOSIS — Z043 Encounter for examination and observation following other accident: Secondary | ICD-10-CM | POA: Insufficient documentation

## 2017-09-02 DIAGNOSIS — S0003XA Contusion of scalp, initial encounter: Secondary | ICD-10-CM | POA: Diagnosis not present

## 2017-09-02 DIAGNOSIS — R52 Pain, unspecified: Secondary | ICD-10-CM | POA: Diagnosis not present

## 2017-09-02 DIAGNOSIS — I10 Essential (primary) hypertension: Secondary | ICD-10-CM | POA: Diagnosis not present

## 2017-09-02 LAB — PROTIME-INR
INR: 1.75
PROTHROMBIN TIME: 20.3 s — AB (ref 11.4–15.2)

## 2017-09-02 LAB — URINALYSIS, ROUTINE W REFLEX MICROSCOPIC
Bilirubin Urine: NEGATIVE
Glucose, UA: NEGATIVE mg/dL
Hgb urine dipstick: NEGATIVE
Ketones, ur: NEGATIVE mg/dL
Leukocytes, UA: NEGATIVE
Nitrite: POSITIVE — AB
PROTEIN: NEGATIVE mg/dL
Specific Gravity, Urine: 1.004 — ABNORMAL LOW (ref 1.005–1.030)
pH: 7 (ref 5.0–8.0)

## 2017-09-02 MED ORDER — NITROFURANTOIN MONOHYD MACRO 100 MG PO CAPS: 100.0000 mg | ORAL_CAPSULE | Freq: Two times a day (BID) | ORAL | 0 refills | Status: DC

## 2017-09-02 NOTE — Discharge Instructions (Signed)
Take antibiotics as prescribed.  Take the entire course, even if your symptoms improve. Use Tylenol and ice as needed for pain and swelling. Follow-up with your primary care doctor next week for further evaluation of your symptoms. Return to the emergency room if you develop vision changes, slurred speech, severe headache, vomiting, or any new or concerning symptoms.

## 2017-09-02 NOTE — ED Provider Notes (Signed)
[facility] EMERGENCY DEPARTMENT Provider Note   CSN: [id number] Arrival date & time: 09/02/17  1655     History   Chief Complaint Chief Complaint  Patient presents with  . Fall    HPI [name] is a 84 y.o. female presenting for evaluation after a fall.  Patient states she had a mechanical fall just prior to arrival.  She hit her front forehead on a picnic table.  She denies loss of consciousness.  She was able to stand up and ambulate without difficulty.  She is on warfarin.  She denies headache, neck pain, or pain elsewhere.  She denies feeling poorly prior to the fall.  She has not taken anything for pain.  She currently reports pain just at the site of injury on her forehead.  She initially reported left eye blurry vision, but reports that this is resolved.  She denies vision changes, slurred speech, decreased concentration, chest pain, shortness breath, nausea, vomiting, abdominal pain, loss of bowel or bladder control, numbness, or tingling.  She denies urinary symptoms or abnormal bowel movements.  HPI  Past Medical History:  Diagnosis Date  . Arrhythmia    chronic atrial fib  . Atrial fibrillation, chronic ([place])   . Chronic anticoagulation   . Chronic atrial fibrillation ([place]) 01/28/2017  . Chronic venous insufficiency 01/28/2017  . Long term current use of anticoagulant therapy 01/28/2017  . Osteoarthritis   . Presence of permanent cardiac pacemaker 01/28/2017   Original implant 1992 [employer] 431-04 Generator change 2001 and 2011.  Medtronic generator.   . SSS (sick sinus syndrome) ([place])   . Thyroid disease    hypothyroidism    Patient Active Problem List   Diagnosis Date Noted  . Bradycardia 02/02/2017  . Presence of permanent cardiac pacemaker 01/28/2017  . Chronic atrial fibrillation ([place]) 01/28/2017  . Long term current use of anticoagulant therapy 01/28/2017  . Chronic venous insufficiency 01/28/2017  . DOE (dyspnea on  exertion) 09/22/2015    Past Surgical History:  Procedure Laterality Date  . ABDOMINAL HYSTERECTOMY    . INSERT / REPLACE / REMOVE PACEMAKER     generator change 2011 medtronic sigma  . KNEE SURGERY    . PACEMAKER INSERTION    . PPM GENERATOR CHANGEOUT N/A 02/02/2017   Procedure: PPM GENERATOR CHANGEOUT;  Surgeon: Deboraha Sprang, MD;  Location: [facility];  Service: Cardiovascular;  Laterality: N/A;  . REPLACEMENT TOTAL KNEE BILATERAL    . VARICOSE VEIN SURGERY       OB History   None      Home Medications    Prior to Admission medications   Medication Sig Start Date End Date Taking? Authorizing Provider  acetaminophen (TYLENOL) 500 MG tablet Take 1,000 mg every 8 (eight) hours as needed by mouth for mild pain or moderate pain.    [provider]  Bepotastine Besilate (BEPREVE OP) Place 1 drop 2 (two) times daily as needed into both eyes (eye allergies).    [provider]  celecoxib (CELEBREX) 200 MG capsule Take 200 mg at bedtime by mouth.     [provider]  cholecalciferol (VITAMIN D) 1000 units tablet Take 1,000 Units daily by mouth.    [provider]  digoxin (LANOXIN) 0.125 MG tablet Take 0.125 mg every morning by mouth.    [provider]  diphenhydrAMINE (BENADRYL) 25 MG tablet Take 50 mg by mouth every 6 (six) hours as needed. Allergic reaction  [provider]  docusate sodium (COLACE) 100 MG capsule Take 100 mg daily as needed by mouth for mild constipation.    [provider]  fluticasone (FLONASE) 50 MCG/ACT nasal spray Place 1 spray daily as needed into both nostrils for allergies or rhinitis.    [provider]  levothyroxine (SYNTHROID, LEVOTHROID) 125 MCG tablet Take 125 mcg at bedtime by mouth.     [provider]  nitrofurantoin, macrocrystal-monohydrate, (MACROBID) 100 MG capsule Take 1 capsule (100 mg total) by mouth 2 (two) times daily. 09/02/17   Julizza Sassone,  PA-C  oxybutynin (DITROPAN) 5 MG tablet Take 5 mg daily by mouth.     [provider]  sertraline (ZOLOFT) 50 MG tablet Take 25 mg by mouth daily.    [provider]  vitamin B-12 (CYANOCOBALAMIN) 1000 MCG tablet Take 1,000 mcg daily by mouth.    [provider]  warfarin (COUMADIN) 4 MG tablet Take 4 mg at bedtime by mouth.     [provider]    Family History No family history on file.  Social History Social History   Tobacco Use  . Smoking status: Never Smoker  . Smokeless tobacco: Never Used  Substance Use Topics  . Alcohol use: No  . Drug use: No     Allergies   Penicillins   Review of Systems Review of Systems  HENT:       Head pain   Skin: Positive for wound.  Neurological: Negative for dizziness and headaches.  Hematological: Bruises/bleeds easily.  All other systems reviewed and are negative.    Physical Exam Updated Vital Signs BP (!) 162/68   Pulse 63   Temp 98.7 F (37.1 C) (Oral)   Resp 16   SpO2 98%   Physical Exam  Constitutional: She is oriented to person, place, and time. She appears well-developed and well-nourished. No distress.  Appears in no distress  HENT:  Head: Normocephalic. Head is with contusion and with laceration. Head is without raccoon's eyes and without Battle's sign.    Right Ear: Tympanic membrane, external ear and ear canal normal.  Left Ear: Tympanic membrane, external ear and ear canal normal.  Nose: Nose normal.  Mouth/Throat: Uvula is midline, oropharynx is clear and moist and mucous membranes are normal.  Eyes: Pupils are equal, round, and reactive to light. Conjunctivae and EOM are normal.  EOMI and PERRLA.  No nystagmus.  Neck: Normal range of motion. Neck supple.  No tenderness to palpation midline C-spine.  No step-offs.  Full active range of motion of the head/neck without pain.  Cardiovascular: Normal rate, regular rhythm and intact distal pulses.  Pulmonary/Chest: Effort  normal and breath sounds normal. No respiratory distress. She has no wheezes.  Abdominal: Soft. She exhibits no distension. There is no tenderness.  Musculoskeletal: Normal range of motion. She exhibits no tenderness or deformity.  Strength intact x4.  Sensation intact x4.  Radial and pedal pulses intact bilaterally.  No obvious injury or deformity.  Left leg is more swollen than the right, patient states this is baseline.  Neurological: She is alert and oriented to person, place, and time. She has normal strength. No cranial nerve deficit or sensory deficit. Coordination normal. GCS eye subscore is 4. GCS verbal subscore is 5. GCS motor subscore is 6.  No obvious neurologic deficits.  CN intact.  Coordination intact.  Skin: Skin is warm and dry. Capillary refill takes less than 2 seconds.  Psychiatric: She has a normal mood  and affect.  Nursing note and vitals reviewed.    ED Treatments / Results  Labs (all labs ordered are listed, but only abnormal results are displayed) Labs Reviewed  URINALYSIS, ROUTINE W REFLEX MICROSCOPIC - Abnormal; Notable for the following components:      Result Value   Specific Gravity, Urine 1.004 (*)    Nitrite POSITIVE (*)    Bacteria, UA MANY (*)    All other components within normal limits  PROTIME-INR - Abnormal; Notable for the following components:   Prothrombin Time 20.3 (*)    All other components within normal limits  URINE CULTURE    EKG None  Radiology Ct Head Wo Contrast  Result Date: 09/02/2017 CLINICAL DATA:  [place] and hit head laceration to left forehead on Coumadin EXAM: CT HEAD WITHOUT CONTRAST CT CERVICAL SPINE WITHOUT CONTRAST TECHNIQUE: Multidetector CT imaging of the head and cervical spine was performed following the standard protocol without intravenous contrast. Multiplanar CT image reconstructions of the cervical spine were also generated. COMPARISON:  Cervical spine CT 09/13/2016, head CT 09/18/2012 FINDINGS: CT HEAD FINDINGS  Brain: No acute territorial infarction, hemorrhage or intracranial mass is visualized. Moderate atrophy. Mild small vessel ischemic changes of the white matter. Stable ventricle size. Vascular: No hyperdense vessels.  Carotid vascular calcification Skull: No fracture Sinuses/Orbits: No acute finding. Other: Moderate left forehead scalp hematoma CT CERVICAL SPINE FINDINGS Alignment: Trace anterior listhesis C4 on C5, no change. Facet alignment within normal limits. Skull base and vertebrae: No acute fracture. No primary bone lesion or focal pathologic process. Soft tissues and spinal canal: No prevertebral fluid or swelling. No visible canal hematoma. Disc levels: Moderate degenerative changes at C4-C5. Mild degenerative changes at C3-C4, C5-C6. Multiple level bilateral facet degenerative change. Upper chest: Mild emphysematous disease in the apices. Thirteen mm oval soft tissue nodule anterior aspect of the left parotid. Other: None IMPRESSION: 1. No CT evidence for acute intracranial abnormality. Atrophy with small vessel ischemic changes of the white matter. Moderate left forehead scalp hematoma 2. Degenerative changes of the cervical spine. No acute osseous abnormality 3. 13 mm soft tissue nodule anterior aspect of left parotid. Nonemergent MRI follow-up could be obtained to further evaluate 4. Emphysematous disease at the lung apices Electronically Signed   By: Donavan Foil M.D.   On: 09/02/2017 19:15   Ct Cervical Spine Wo Contrast  Result Date: 09/02/2017 CLINICAL DATA:  [place] and hit head laceration to left forehead on Coumadin EXAM: CT HEAD WITHOUT CONTRAST CT CERVICAL SPINE WITHOUT CONTRAST TECHNIQUE: Multidetector CT imaging of the head and cervical spine was performed following the standard protocol without intravenous contrast. Multiplanar CT image reconstructions of the cervical spine were also generated. COMPARISON:  Cervical spine CT 09/13/2016, head CT 09/18/2012 FINDINGS: CT HEAD FINDINGS Brain:  No acute territorial infarction, hemorrhage or intracranial mass is visualized. Moderate atrophy. Mild small vessel ischemic changes of the white matter. Stable ventricle size. Vascular: No hyperdense vessels.  Carotid vascular calcification Skull: No fracture Sinuses/Orbits: No acute finding. Other: Moderate left forehead scalp hematoma CT CERVICAL SPINE FINDINGS Alignment: Trace anterior listhesis C4 on C5, no change. Facet alignment within normal limits. Skull base and vertebrae: No acute fracture. No primary bone lesion or focal pathologic process. Soft tissues and spinal canal: No prevertebral fluid or swelling. No visible canal hematoma. Disc levels: Moderate degenerative changes at C4-C5. Mild degenerative changes at C3-C4, C5-C6. Multiple level bilateral facet degenerative change. Upper chest: Mild emphysematous disease in the apices. Thirteen mm oval soft  tissue nodule anterior aspect of the left parotid. Other: None IMPRESSION: 1. No CT evidence for acute intracranial abnormality. Atrophy with small vessel ischemic changes of the white matter. Moderate left forehead scalp hematoma 2. Degenerative changes of the cervical spine. No acute osseous abnormality 3. 13 mm soft tissue nodule anterior aspect of left parotid. Nonemergent MRI follow-up could be obtained to further evaluate 4. Emphysematous disease at the lung apices Electronically Signed   By: Donavan Foil M.D.   On: 09/02/2017 19:15    Procedures Procedures (including critical care time)  Medications Ordered in ED Medications - No data to display   Initial Impression / Assessment and Plan / ED Course  I have reviewed the triage vital signs and the nursing notes.  Pertinent labs & imaging results that were available during my care of the patient were reviewed by me and considered in my medical decision making (see chart for details).     Pt presenting for evaluation after a fall.  Physical exam reassuring, no obvious neurologic  deficits.  However, patient is on warfarin and has a hematoma of her left forehead.  Will obtain CT head and neck for further evaluation.  CT head and neck negative for acute findings.  Discussed with patient.  Patient has been urinating frequently since in the ER, will obtain UA to ensure no infection.  UA with nitrites and many bacteria, will treat for possible infection.  Culture sent.  PT/INR shows pt is not at increased risk for bleeding. At this time, pt appears safe for d/c. Return precautions given. Pt states she understands and agrees to plan.  Final Clinical Impressions(s) / ED Diagnoses   Final diagnoses:  Fall, initial encounter  Acute cystitis without hematuria    ED Discharge Orders        Ordered    Protime-INR  Status:  Canceled     09/02/17 2022    nitrofurantoin, macrocrystal-monohydrate, (MACROBID) 100 MG capsule  2 times daily     09/02/17 2135       Franchot Heidelberg, PA-C 09/02/17 2144    Sherwood Gambler, MD 09/04/17 1615

## 2017-09-02 NOTE — ED Triage Notes (Signed)
Pt arrived via [facility] from home after falling and striking her head while reaching for a phone. Pt denies LOC and remembers entire incident. Pt has laceration to left forehead  And states she is taking coumadin. Pt denies neck pain at time of triage. EMS v/s 168/94, 88hr, 20 rr, 96%ra. PT is alert and oriented x4.

## 2017-09-05 LAB — URINE CULTURE

## 2017-09-06 ENCOUNTER — Telehealth: Payer: Self-pay | Admitting: *Deleted

## 2017-09-06 NOTE — Telephone Encounter (Signed)
Post ED Visit - Positive Culture Follow-up  Culture report reviewed by antimicrobial stewardship pharmacist:  []  Elenor Quinones, Pharm.D. []  Heide Guile, Pharm.D., BCPS AQ-ID []  Parks Neptune, Pharm.D., BCPS []  Alycia Rossetti, Pharm.D., BCPS []  [place], Pharm.D., BCPS, AAHIVP []  Legrand Como, Pharm.D., BCPS, AAHIVP []  Salome Arnt, PharmD, BCPS []  Wynell Balloon, PharmD []  Vincenza Hews, PharmD, BCPS Angus Seller, PharmD  Positive uirine culture Treated with Nitrofurantoin [facility], organism sensitive to the same and no further patient follow-up is required at this time.  Harlon Flor [facility] 09/06/2017, 12:31 PM

## 2017-09-07 DIAGNOSIS — I480 Paroxysmal atrial fibrillation: Secondary | ICD-10-CM | POA: Diagnosis not present

## 2017-09-07 DIAGNOSIS — S0990XA Unspecified injury of head, initial encounter: Secondary | ICD-10-CM | POA: Diagnosis not present

## 2017-09-07 DIAGNOSIS — N390 Urinary tract infection, site not specified: Secondary | ICD-10-CM | POA: Diagnosis not present

## 2017-09-07 DIAGNOSIS — Z7901 Long term (current) use of anticoagulants: Secondary | ICD-10-CM | POA: Diagnosis not present

## 2017-09-07 DIAGNOSIS — K118 Other diseases of salivary glands: Secondary | ICD-10-CM | POA: Diagnosis not present

## 2017-09-07 DIAGNOSIS — W01198A Fall on same level from slipping, tripping and stumbling with subsequent striking against other object, initial encounter: Secondary | ICD-10-CM | POA: Diagnosis not present

## 2017-09-07 DIAGNOSIS — Z6825 Body mass index (BMI) 25.0-25.9, adult: Secondary | ICD-10-CM | POA: Diagnosis not present

## 2017-09-16 DIAGNOSIS — H02051 Trichiasis without entropian right upper eyelid: Secondary | ICD-10-CM | POA: Diagnosis not present

## 2017-09-16 DIAGNOSIS — Z961 Presence of intraocular lens: Secondary | ICD-10-CM | POA: Diagnosis not present

## 2017-09-16 DIAGNOSIS — H52203 Unspecified astigmatism, bilateral: Secondary | ICD-10-CM | POA: Diagnosis not present

## 2017-09-16 DIAGNOSIS — H02403 Unspecified ptosis of bilateral eyelids: Secondary | ICD-10-CM | POA: Diagnosis not present

## 2017-09-19 ENCOUNTER — Ambulatory Visit
Admission: RE | Admit: 2017-09-19 | Discharge: 2017-09-19 | Disposition: A | Discharge status: Home / Self Care | Payer: Medicare Other | Source: Ambulatory Visit | Attending: Internal Medicine | Admitting: Internal Medicine

## 2017-09-19 DIAGNOSIS — R634 Abnormal weight loss: Secondary | ICD-10-CM

## 2017-09-19 DIAGNOSIS — N200 Calculus of kidney: Secondary | ICD-10-CM | POA: Diagnosis not present

## 2017-09-19 DIAGNOSIS — R1013 Epigastric pain: Secondary | ICD-10-CM

## 2017-10-07 DIAGNOSIS — I119 Hypertensive heart disease without heart failure: Secondary | ICD-10-CM | POA: Diagnosis not present

## 2017-10-07 DIAGNOSIS — I951 Orthostatic hypotension: Secondary | ICD-10-CM | POA: Diagnosis not present

## 2017-10-07 DIAGNOSIS — E039 Hypothyroidism, unspecified: Secondary | ICD-10-CM | POA: Diagnosis not present

## 2017-10-07 DIAGNOSIS — E663 Overweight: Secondary | ICD-10-CM | POA: Diagnosis not present

## 2017-10-07 DIAGNOSIS — I482 Chronic atrial fibrillation: Secondary | ICD-10-CM | POA: Diagnosis not present

## 2017-10-07 DIAGNOSIS — I340 Nonrheumatic mitral (valve) insufficiency: Secondary | ICD-10-CM | POA: Diagnosis not present

## 2017-10-07 DIAGNOSIS — Z950 Presence of cardiac pacemaker: Secondary | ICD-10-CM | POA: Diagnosis not present

## 2017-10-07 DIAGNOSIS — Z7901 Long term (current) use of anticoagulants: Secondary | ICD-10-CM | POA: Diagnosis not present

## 2017-10-07 DIAGNOSIS — I872 Venous insufficiency (chronic) (peripheral): Secondary | ICD-10-CM | POA: Diagnosis not present

## 2017-10-23 DIAGNOSIS — I119 Hypertensive heart disease without heart failure: Secondary | ICD-10-CM | POA: Diagnosis not present

## 2017-10-23 DIAGNOSIS — E663 Overweight: Secondary | ICD-10-CM | POA: Diagnosis not present

## 2017-10-23 DIAGNOSIS — Z950 Presence of cardiac pacemaker: Secondary | ICD-10-CM | POA: Diagnosis not present

## 2017-10-23 DIAGNOSIS — I951 Orthostatic hypotension: Secondary | ICD-10-CM | POA: Diagnosis not present

## 2017-10-23 DIAGNOSIS — E039 Hypothyroidism, unspecified: Secondary | ICD-10-CM | POA: Diagnosis not present

## 2017-10-23 DIAGNOSIS — I340 Nonrheumatic mitral (valve) insufficiency: Secondary | ICD-10-CM | POA: Diagnosis not present

## 2017-10-23 DIAGNOSIS — Z7901 Long term (current) use of anticoagulants: Secondary | ICD-10-CM | POA: Diagnosis not present

## 2017-10-23 DIAGNOSIS — I482 Chronic atrial fibrillation: Secondary | ICD-10-CM | POA: Diagnosis not present

## 2017-10-23 DIAGNOSIS — I872 Venous insufficiency (chronic) (peripheral): Secondary | ICD-10-CM | POA: Diagnosis not present

## 2017-11-10 ENCOUNTER — Other Ambulatory Visit: Payer: Self-pay | Admitting: Cardiology

## 2017-11-10 ENCOUNTER — Ambulatory Visit
Admission: RE | Admit: 2017-11-10 | Discharge: 2017-11-10 | Disposition: A | Discharge status: Home / Self Care | Payer: Medicare Other | Source: Ambulatory Visit | Attending: Cardiology | Admitting: Cardiology

## 2017-11-10 DIAGNOSIS — E039 Hypothyroidism, unspecified: Secondary | ICD-10-CM | POA: Diagnosis not present

## 2017-11-10 DIAGNOSIS — E663 Overweight: Secondary | ICD-10-CM | POA: Diagnosis not present

## 2017-11-10 DIAGNOSIS — I951 Orthostatic hypotension: Secondary | ICD-10-CM | POA: Diagnosis not present

## 2017-11-10 DIAGNOSIS — Z7901 Long term (current) use of anticoagulants: Secondary | ICD-10-CM | POA: Diagnosis not present

## 2017-11-10 DIAGNOSIS — R0602 Shortness of breath: Secondary | ICD-10-CM

## 2017-11-10 DIAGNOSIS — J449 Chronic obstructive pulmonary disease, unspecified: Secondary | ICD-10-CM | POA: Diagnosis not present

## 2017-11-10 DIAGNOSIS — Z950 Presence of cardiac pacemaker: Secondary | ICD-10-CM | POA: Diagnosis not present

## 2017-11-10 DIAGNOSIS — I119 Hypertensive heart disease without heart failure: Secondary | ICD-10-CM | POA: Diagnosis not present

## 2017-11-10 DIAGNOSIS — I482 Chronic atrial fibrillation: Secondary | ICD-10-CM | POA: Diagnosis not present

## 2017-11-10 DIAGNOSIS — I340 Nonrheumatic mitral (valve) insufficiency: Secondary | ICD-10-CM | POA: Diagnosis not present

## 2017-11-10 DIAGNOSIS — I872 Venous insufficiency (chronic) (peripheral): Secondary | ICD-10-CM | POA: Diagnosis not present

## 2017-11-11 DIAGNOSIS — E038 Other specified hypothyroidism: Secondary | ICD-10-CM | POA: Diagnosis not present

## 2017-11-18 DIAGNOSIS — G5602 Carpal tunnel syndrome, left upper limb: Secondary | ICD-10-CM | POA: Diagnosis not present

## 2017-11-18 DIAGNOSIS — G5601 Carpal tunnel syndrome, right upper limb: Secondary | ICD-10-CM | POA: Diagnosis not present

## 2017-11-20 ENCOUNTER — Emergency Department (HOSPITAL_COMMUNITY): Payer: Medicare Other

## 2017-11-20 ENCOUNTER — Other Ambulatory Visit: Payer: Self-pay

## 2017-11-20 ENCOUNTER — Encounter (HOSPITAL_COMMUNITY): Payer: Self-pay | Admitting: Emergency Medicine

## 2017-11-20 ENCOUNTER — Inpatient Hospital Stay (HOSPITAL_COMMUNITY)
Admission: EM | Admit: 2017-11-20 | Discharge: 2017-11-24 | DRG: 563 | Disposition: A | Discharge status: Skilled Nursing Facility | Payer: Medicare Other | Attending: Internal Medicine | Admitting: Internal Medicine

## 2017-11-20 DIAGNOSIS — D72829 Elevated white blood cell count, unspecified: Secondary | ICD-10-CM | POA: Diagnosis not present

## 2017-11-20 DIAGNOSIS — I482 Chronic atrial fibrillation, unspecified: Secondary | ICD-10-CM

## 2017-11-20 DIAGNOSIS — E871 Hypo-osmolality and hyponatremia: Secondary | ICD-10-CM | POA: Diagnosis not present

## 2017-11-20 DIAGNOSIS — I700 Atherosclerosis of aorta: Secondary | ICD-10-CM | POA: Diagnosis present

## 2017-11-20 DIAGNOSIS — S42009A Fracture of unspecified part of unspecified clavicle, initial encounter for closed fracture: Secondary | ICD-10-CM | POA: Diagnosis present

## 2017-11-20 DIAGNOSIS — S42021A Displaced fracture of shaft of right clavicle, initial encounter for closed fracture: Secondary | ICD-10-CM | POA: Diagnosis not present

## 2017-11-20 DIAGNOSIS — S299XXA Unspecified injury of thorax, initial encounter: Secondary | ICD-10-CM | POA: Diagnosis not present

## 2017-11-20 DIAGNOSIS — Z79890 Hormone replacement therapy (postmenopausal): Secondary | ICD-10-CM

## 2017-11-20 DIAGNOSIS — E039 Hypothyroidism, unspecified: Secondary | ICD-10-CM | POA: Diagnosis not present

## 2017-11-20 DIAGNOSIS — I129 Hypertensive chronic kidney disease with stage 1 through stage 4 chronic kidney disease, or unspecified chronic kidney disease: Secondary | ICD-10-CM | POA: Diagnosis present

## 2017-11-20 DIAGNOSIS — W19XXXA Unspecified fall, initial encounter: Secondary | ICD-10-CM | POA: Diagnosis not present

## 2017-11-20 DIAGNOSIS — R296 Repeated falls: Secondary | ICD-10-CM | POA: Diagnosis present

## 2017-11-20 DIAGNOSIS — I10 Essential (primary) hypertension: Secondary | ICD-10-CM | POA: Diagnosis not present

## 2017-11-20 DIAGNOSIS — R0902 Hypoxemia: Secondary | ICD-10-CM | POA: Diagnosis not present

## 2017-11-20 DIAGNOSIS — Z96653 Presence of artificial knee joint, bilateral: Secondary | ICD-10-CM | POA: Diagnosis present

## 2017-11-20 DIAGNOSIS — M25531 Pain in right wrist: Secondary | ICD-10-CM | POA: Diagnosis present

## 2017-11-20 DIAGNOSIS — W010XXA Fall on same level from slipping, tripping and stumbling without subsequent striking against object, initial encounter: Secondary | ICD-10-CM | POA: Diagnosis present

## 2017-11-20 DIAGNOSIS — I071 Rheumatic tricuspid insufficiency: Secondary | ICD-10-CM | POA: Diagnosis present

## 2017-11-20 DIAGNOSIS — S199XXA Unspecified injury of neck, initial encounter: Secondary | ICD-10-CM | POA: Diagnosis not present

## 2017-11-20 DIAGNOSIS — M25519 Pain in unspecified shoulder: Secondary | ICD-10-CM | POA: Diagnosis not present

## 2017-11-20 DIAGNOSIS — N183 Chronic kidney disease, stage 3 (moderate): Secondary | ICD-10-CM | POA: Diagnosis present

## 2017-11-20 DIAGNOSIS — F419 Anxiety disorder, unspecified: Secondary | ICD-10-CM | POA: Diagnosis present

## 2017-11-20 DIAGNOSIS — R5381 Other malaise: Secondary | ICD-10-CM | POA: Diagnosis present

## 2017-11-20 DIAGNOSIS — R001 Bradycardia, unspecified: Secondary | ICD-10-CM | POA: Diagnosis present

## 2017-11-20 DIAGNOSIS — F329 Major depressive disorder, single episode, unspecified: Secondary | ICD-10-CM | POA: Diagnosis present

## 2017-11-20 DIAGNOSIS — R000 Tachycardia, unspecified: Secondary | ICD-10-CM | POA: Diagnosis not present

## 2017-11-20 DIAGNOSIS — Y92009 Unspecified place in unspecified non-institutional (private) residence as the place of occurrence of the external cause: Secondary | ICD-10-CM

## 2017-11-20 DIAGNOSIS — Z950 Presence of cardiac pacemaker: Secondary | ICD-10-CM | POA: Diagnosis present

## 2017-11-20 DIAGNOSIS — S0990XA Unspecified injury of head, initial encounter: Secondary | ICD-10-CM | POA: Diagnosis not present

## 2017-11-20 DIAGNOSIS — I4891 Unspecified atrial fibrillation: Secondary | ICD-10-CM | POA: Diagnosis not present

## 2017-11-20 DIAGNOSIS — Z7901 Long term (current) use of anticoagulants: Secondary | ICD-10-CM

## 2017-11-20 DIAGNOSIS — S3993XA Unspecified injury of pelvis, initial encounter: Secondary | ICD-10-CM | POA: Diagnosis not present

## 2017-11-20 HISTORY — DX: Other complications of anesthesia, initial encounter: T88.59XA

## 2017-11-20 HISTORY — DX: Fracture of unspecified part of unspecified clavicle, initial encounter for closed fracture: S42.009A

## 2017-11-20 HISTORY — DX: Adverse effect of unspecified anesthetic, initial encounter: T41.45XA

## 2017-11-20 LAB — COMPREHENSIVE METABOLIC PANEL
ALK PHOS: 77 U/L (ref 38–126)
ALT: 21 U/L (ref 0–44)
AST: 26 U/L (ref 15–41)
Albumin: 4 g/dL (ref 3.5–5.0)
Anion gap: 11 (ref 5–15)
BUN: 35 mg/dL — ABNORMAL HIGH (ref 8–23)
CALCIUM: 9.2 mg/dL (ref 8.9–10.3)
CHLORIDE: 101 mmol/L (ref 98–111)
CO2: 26 mmol/L (ref 22–32)
CREATININE: 1.11 mg/dL — AB (ref 0.44–1.00)
GFR calc Af Amer: 51 mL/min — ABNORMAL LOW (ref >60)
GFR, EST NON AFRICAN AMERICAN: 44 mL/min — AB (ref >60)
Glucose, Bld: 156 mg/dL — ABNORMAL HIGH (ref 70–99)
Potassium: 3.9 mmol/L (ref 3.5–5.1)
Sodium: 138 mmol/L (ref 135–145)
TOTAL PROTEIN: 7.3 g/dL (ref 6.5–8.1)
Total Bilirubin: 1 mg/dL (ref 0.3–1.2)

## 2017-11-20 LAB — PROTIME-INR
INR: 2.41
Prothrombin Time: 26 seconds — ABNORMAL HIGH (ref 11.4–15.2)

## 2017-11-20 LAB — CBC WITH DIFFERENTIAL/PLATELET
ABS IMMATURE GRANULOCYTES: 0.1 10*3/uL (ref 0.0–0.1)
BASOS ABS: 0 10*3/uL (ref 0.0–0.1)
BASOS PCT: 0 %
EOS ABS: 0 10*3/uL (ref 0.0–0.7)
Eosinophils Relative: 0 %
HCT: 41.1 % (ref 36.0–46.0)
Hemoglobin: 13.3 g/dL (ref 12.0–15.0)
IMMATURE GRANULOCYTES: 1 %
Lymphocytes Relative: 5 %
Lymphs Abs: 0.6 10*3/uL — ABNORMAL LOW (ref 0.7–4.0)
MCH: 27.9 pg (ref 26.0–34.0)
MCHC: 32.4 g/dL (ref 30.0–36.0)
MCV: 86.3 fL (ref 78.0–100.0)
Monocytes Absolute: 1.1 10*3/uL — ABNORMAL HIGH (ref 0.1–1.0)
Monocytes Relative: 9 %
NEUTROS ABS: 10.2 10*3/uL — AB (ref 1.7–7.7)
NEUTROS PCT: 85 %
Platelets: 260 10*3/uL (ref 150–400)
RBC: 4.76 MIL/uL (ref 3.87–5.11)
RDW: 14.7 % (ref 11.5–15.5)
WBC: 12 10*3/uL — AB (ref 4.0–10.5)

## 2017-11-20 LAB — URINALYSIS, ROUTINE W REFLEX MICROSCOPIC
BACTERIA UA: NONE SEEN
BILIRUBIN URINE: NEGATIVE
GLUCOSE, UA: NEGATIVE mg/dL
KETONES UR: NEGATIVE mg/dL
Leukocytes, UA: NEGATIVE
NITRITE: NEGATIVE
Protein, ur: NEGATIVE mg/dL
SPECIFIC GRAVITY, URINE: 1.031 — AB (ref 1.005–1.030)
pH: 6 (ref 5.0–8.0)

## 2017-11-20 LAB — I-STAT TROPONIN, ED: TROPONIN I, POC: 0.02 ng/mL (ref 0.00–0.08)

## 2017-11-20 LAB — HEMOGLOBIN A1C
HEMOGLOBIN A1C: 6 % — AB (ref 4.8–5.6)
Mean Plasma Glucose: 125.5 mg/dL

## 2017-11-20 MED ORDER — MORPHINE SULFATE (PF) 4 MG/ML IV SOLN
4.0000 mg | Freq: Once | INTRAVENOUS | Status: AC
  Administered 2017-11-20: 4 mg via INTRAVENOUS
  Filled 2017-11-20: qty 1

## 2017-11-20 MED ORDER — IOPAMIDOL (ISOVUE-370) INJECTION 76%
100.0000 mL | Freq: Once | INTRAVENOUS | Status: AC | PRN
  Administered 2017-11-20: 100 mL via INTRAVENOUS

## 2017-11-20 MED ORDER — VITAMIN D 1000 UNITS PO TABS
1000.0000 [IU] | ORAL_TABLET | Freq: Every day | ORAL | Status: DC
  Administered 2017-11-20 – 2017-11-24 (×5): 1000 [IU] via ORAL
  Filled 2017-11-20 (×5): qty 1

## 2017-11-20 MED ORDER — SODIUM CHLORIDE 0.9 % IV BOLUS: 500.0000 mL | Freq: Once | INTRAVENOUS | Status: DC

## 2017-11-20 MED ORDER — BEPOTASTINE BESILATE 1.5 % OP SOLN: 1.0000 [drp] | Freq: Two times a day (BID) | OPHTHALMIC | Status: DC | PRN

## 2017-11-20 MED ORDER — POLYETHYLENE GLYCOL 3350 17 G PO PACK
17.0000 g | PACK | Freq: Every day | ORAL | Status: DC
  Administered 2017-11-22 – 2017-11-24 (×3): 17 g via ORAL
  Filled 2017-11-20 (×4): qty 1

## 2017-11-20 MED ORDER — FLUTICASONE PROPIONATE 50 MCG/ACT NA SUSP
1.0000 | Freq: Every day | NASAL | Status: DC | PRN
  Filled 2017-11-20: qty 16

## 2017-11-20 MED ORDER — SENNOSIDES-DOCUSATE SODIUM 8.6-50 MG PO TABS
1.0000 | ORAL_TABLET | Freq: Two times a day (BID) | ORAL | Status: DC
  Administered 2017-11-20 – 2017-11-24 (×8): 1 via ORAL
  Filled 2017-11-20 (×8): qty 1

## 2017-11-20 MED ORDER — HYDROMORPHONE HCL 1 MG/ML IJ SOLN
0.5000 mg | INTRAMUSCULAR | Status: DC | PRN
  Administered 2017-11-21: 0.5 mg via INTRAVENOUS
  Filled 2017-11-20: qty 1

## 2017-11-20 MED ORDER — MECLIZINE HCL 25 MG PO TABS
25.0000 mg | ORAL_TABLET | Freq: Every day | ORAL | Status: DC | PRN
  Administered 2017-11-22: 25 mg via ORAL
  Filled 2017-11-20 (×2): qty 1

## 2017-11-20 MED ORDER — LEVOTHYROXINE SODIUM 125 MCG PO TABS
125.0000 ug | ORAL_TABLET | Freq: Every day | ORAL | Status: DC
  Administered 2017-11-21 – 2017-11-24 (×4): 125 ug via ORAL
  Filled 2017-11-20 (×4): qty 1

## 2017-11-20 MED ORDER — IOPAMIDOL (ISOVUE-300) INJECTION 61%
INTRAVENOUS | Status: AC
  Filled 2017-11-20: qty 100

## 2017-11-20 MED ORDER — HYDRALAZINE HCL 20 MG/ML IJ SOLN
10.0000 mg | Freq: Four times a day (QID) | INTRAMUSCULAR | Status: DC | PRN
  Administered 2017-11-22: 10 mg via INTRAVENOUS
  Filled 2017-11-20: qty 1

## 2017-11-20 MED ORDER — SERTRALINE HCL 50 MG PO TABS
25.0000 mg | ORAL_TABLET | Freq: Every day | ORAL | Status: DC
  Administered 2017-11-20 – 2017-11-24 (×5): 25 mg via ORAL
  Filled 2017-11-20 (×5): qty 1

## 2017-11-20 MED ORDER — ONDANSETRON HCL 4 MG/2ML IJ SOLN: 4.0000 mg | Freq: Four times a day (QID) | INTRAMUSCULAR | Status: DC | PRN

## 2017-11-20 MED ORDER — VITAMIN B-12 1000 MCG PO TABS
1000.0000 ug | ORAL_TABLET | Freq: Every day | ORAL | Status: DC
  Administered 2017-11-20 – 2017-11-24 (×5): 1000 ug via ORAL
  Filled 2017-11-20 (×5): qty 1

## 2017-11-20 MED ORDER — SODIUM CHLORIDE 0.9 % IV SOLN
INTRAVENOUS | Status: DC
  Administered 2017-11-20 – 2017-11-21 (×2): via INTRAVENOUS

## 2017-11-20 MED ORDER — AMLODIPINE BESYLATE 5 MG PO TABS
5.0000 mg | ORAL_TABLET | Freq: Every day | ORAL | Status: DC
  Administered 2017-11-20 – 2017-11-24 (×5): 5 mg via ORAL
  Filled 2017-11-20 (×5): qty 1

## 2017-11-20 MED ORDER — ONDANSETRON HCL 4 MG/2ML IJ SOLN
4.0000 mg | Freq: Once | INTRAMUSCULAR | Status: AC
  Administered 2017-11-20: 4 mg via INTRAVENOUS
  Filled 2017-11-20: qty 2

## 2017-11-20 MED ORDER — DIGOXIN 125 MCG PO TABS
0.1250 mg | ORAL_TABLET | ORAL | Status: DC
  Administered 2017-11-21 – 2017-11-24 (×4): 0.125 mg via ORAL
  Filled 2017-11-20 (×4): qty 1

## 2017-11-20 MED ORDER — OXYCODONE HCL ER 10 MG PO T12A
10.0000 mg | EXTENDED_RELEASE_TABLET | Freq: Two times a day (BID) | ORAL | Status: DC
  Administered 2017-11-20 – 2017-11-21 (×2): 10 mg via ORAL
  Filled 2017-11-20 (×3): qty 1

## 2017-11-20 NOTE — Discharge Instructions (Addendum)
There is a fracture to the clavicle (collarbone) noted on the x-ray.  Please wear the sling at all times for comfort.  Follow-up with the orthopedic specialist on Tuesday, September 3.  Call the office in the morning to be seen in the afternoon.    Cardiology for possible righ heart insufficiency  Call Tuesday morning to be seen Tuesday afternoon   Information on my medicine - Coumadin   (Warfarin)  This medication education was reviewed with me or my healthcare representative as part of my discharge preparation.  The pharmacist that spoke with me during my hospital stay was:  Saundra Shelling, [facility]  Why was Coumadin prescribed for you? Coumadin was prescribed for you because you have a blood clot or a medical condition that can cause an increased risk of forming blood clots. Blood clots can cause serious health problems by blocking the flow of blood to the heart, lung, or brain. Coumadin can prevent harmful blood clots from forming. As a reminder your indication for Coumadin is:   Stroke Prevention Because Of Atrial Fibrillation  What test will check on my response to Coumadin? While on Coumadin (warfarin) you will need to have an INR test regularly to ensure that your dose is keeping you in the desired range. The INR (international normalized ratio) number is calculated from the result of the laboratory test called prothrombin time (PT).  If an INR APPOINTMENT HAS NOT ALREADY BEEN MADE FOR YOU please schedule an appointment to have this lab work done by your health care provider within 7 days. Your INR goal is usually a number between:  2 to 3 or your provider may give you a more narrow range like 2-2.5.  Ask your health care provider during an office visit what your goal INR is.  What  do you need to  know  About  COUMADIN? Take Coumadin (warfarin) exactly as prescribed by your healthcare provider about the same time each day.  DO NOT stop taking without talking to the doctor who prescribed  the medication.  Stopping without other blood clot prevention medication to take the place of Coumadin may increase your risk of developing a new clot or stroke.  Get refills before you run out.  What do you do if you miss a dose? If you miss a dose, take it as soon as you remember on the same day then continue your regularly scheduled regimen the next day.  Do not take two doses of Coumadin at the same time.  Important Safety Information A possible side effect of Coumadin (Warfarin) is an increased risk of bleeding. You should call your healthcare provider right away if you experience any of the following: ? Bleeding from an injury or your nose that does not stop. ? Unusual colored urine (red or dark brown) or unusual colored stools (red or black). ? Unusual bruising for unknown reasons. ? A serious fall or if you hit your head (even if there is no bleeding).  Some foods or medicines interact with Coumadin (warfarin) and might alter your response to warfarin. To help avoid this: ? Eat a balanced diet, maintaining a consistent amount of Vitamin K. ? Notify your provider about major diet changes you plan to make. ? Avoid alcohol or limit your intake to 1 drink for women and 2 drinks for men per day. (1 drink is 5 oz. wine, 12 oz. beer, or 1.5 oz. liquor.)  Make sure that ANY health care provider who prescribes medication for you  knows that you are taking Coumadin (warfarin).  Also make sure the healthcare provider who is monitoring your Coumadin knows when you have started a new medication including herbals and non-prescription products.  Coumadin (Warfarin)  Major Drug Interactions  Increased Warfarin Effect Decreased Warfarin Effect  Alcohol (large quantities) Antibiotics (esp. Septra/Bactrim, Flagyl, Cipro) Amiodarone (Cordarone) Aspirin (ASA) Cimetidine (Tagamet) Megestrol (Megace) NSAIDs (ibuprofen, naproxen, etc.) Piroxicam (Feldene) Propafenone (Rythmol SR) Propranolol  (Inderal) Isoniazid (INH) Posaconazole (Noxafil) Barbiturates (Phenobarbital) Carbamazepine (Tegretol) Chlordiazepoxide (Librium) Cholestyramine (Questran) Griseofulvin Oral Contraceptives Rifampin Sucralfate (Carafate) Vitamin K   Coumadin (Warfarin) Major Herbal Interactions  Increased Warfarin Effect Decreased Warfarin Effect  Garlic Ginseng Ginkgo biloba Coenzyme Q10 Green tea St. Johns wort    Coumadin (Warfarin) FOOD Interactions  Eat a consistent number of servings per week of foods HIGH in Vitamin K (1 serving =  cup)  Collards (cooked, or boiled & drained) Kale (cooked, or boiled & drained) Mustard greens (cooked, or boiled & drained) Parsley *serving size only =  cup Spinach (cooked, or boiled & drained) Swiss chard (cooked, or boiled & drained) Turnip greens (cooked, or boiled & drained)  Eat a consistent number of servings per week of foods MEDIUM-HIGH in Vitamin K (1 serving = 1 cup)  Asparagus (cooked, or boiled & drained) Broccoli (cooked, boiled & drained, or raw & chopped) Brussel sprouts (cooked, or boiled & drained) *serving size only =  cup Lettuce, raw (green leaf, endive, romaine) Spinach, raw Turnip greens, raw & chopped   These websites have more information on Coumadin (warfarin):  FailFactory.se; VeganReport.com.au;

## 2017-11-20 NOTE — ED Notes (Signed)
Pt to CT scan.

## 2017-11-20 NOTE — ED Triage Notes (Signed)
Pt to ED from home with c/o fall this am when getting out of bed, has pain in right clavicle area, with deformity. No other complaints -- pt lives alone, son lives across the street. Pt called 911 and son this am.  Received fentanyl 271mcg enroute.

## 2017-11-20 NOTE — Progress Notes (Signed)
Orthopedic Tech Progress Note Patient Details:  [name] 1930-09-24 [id number]  Ortho Devices Type of Ortho Device: Arm sling Ortho Device/Splint Location: rue Ortho Device/Splint Interventions: Application   Post Interventions Patient Tolerated: Well Instructions Provided: Care of device   Hildred Priest 11/20/2017, 1:53 PM

## 2017-11-20 NOTE — ED Notes (Signed)
medtronic pacemaker interrogated  

## 2017-11-20 NOTE — ED Provider Notes (Signed)
[facility] EMERGENCY DEPARTMENT Provider Note   CSN: [id number] Arrival date & time: 11/20/17  0749     History   Chief Complaint Chief Complaint  Patient presents with  . Fall    HPI [name] is a 84 y.o. female.  HPI   [name] is a 84 y.o. female, with a history of A. fib on anticoagulation and pacemaker, presenting to the ED with a fall that she suspects occurred around 4 AM this morning.  States "I got out of bed and must of been sleepy and disoriented.  I tripped and fell."  Endorses pain to the right chest, right shoulder, right upper back, radiating throughout this region.  Pain in these areas is currently 5/10, initially much more severe prior to fentanyl administration by EMS on scene.  Patient received 200 mcg fentanyl prior to arrival. She states she did hit her head, but is not certain where.  She does not have any new pain to the head or face.  She does have some feelings of difficulty breathing due to pain and not wanting to take a deep breath.  Denies midline neck/back pain, nausea/vomiting, vision deficit, numbness, weakness, abdominal pain, dizziness, headache, LOC, or any other complaints.    Past Medical History:  Diagnosis Date  . Arrhythmia    chronic atrial fib  . Atrial fibrillation, chronic ([place])   . Chronic anticoagulation   . Chronic atrial fibrillation ([place]) 01/28/2017  . Chronic venous insufficiency 01/28/2017  . Long term current use of anticoagulant therapy 01/28/2017  . Osteoarthritis   . Presence of permanent cardiac pacemaker 01/28/2017   Original implant 1992 Lead [employer] 431-04 Generator change 2001 and 2011.  Medtronic generator.   . SSS (sick sinus syndrome) ([place])   . Thyroid disease    hypothyroidism    Patient Active Problem List   Diagnosis Date Noted  . Bradycardia 02/02/2017  . Presence of permanent cardiac pacemaker 01/28/2017  . Chronic atrial fibrillation ([place]) 01/28/2017  . Long  term current use of anticoagulant therapy 01/28/2017  . Chronic venous insufficiency 01/28/2017  . DOE (dyspnea on exertion) 09/22/2015    Past Surgical History:  Procedure Laterality Date  . ABDOMINAL HYSTERECTOMY    . INSERT / REPLACE / REMOVE PACEMAKER     generator change 2011 medtronic sigma  . KNEE SURGERY    . PACEMAKER INSERTION    . PPM GENERATOR CHANGEOUT N/A 02/02/2017   Procedure: PPM GENERATOR CHANGEOUT;  Surgeon: Deboraha Sprang, MD;  Location: [facility];  Service: Cardiovascular;  Laterality: N/A;  . REPLACEMENT TOTAL KNEE BILATERAL    . VARICOSE VEIN SURGERY       OB History   None      Home Medications    Prior to Admission medications   Medication Sig Start Date End Date Taking? Authorizing Provider  acetaminophen (TYLENOL) 500 MG tablet Take 1,000 mg every 8 (eight) hours as needed by mouth for mild pain or moderate pain.   Yes [provider]  amLODipine (NORVASC) 5 MG tablet Take 5 mg by mouth daily.   Yes [provider]  Bepotastine Besilate (BEPREVE OP) Place 1 drop 2 (two) times daily as needed into both eyes (eye allergies).   Yes [provider]  celecoxib (CELEBREX) 200 MG capsule Take 200 mg at bedtime by mouth.    Yes [provider]  cholecalciferol (VITAMIN D) 1000 units tablet Take 1,000 Units daily by mouth.  Yes [provider]  digoxin (LANOXIN) 0.125 MG tablet Take 0.125 mg every morning by mouth.   Yes [provider]  diphenhydrAMINE (BENADRYL) 25 MG tablet Take 50 mg by mouth every 6 (six) hours as needed (for allergic reaction).    Yes [provider]  docusate sodium (COLACE) 100 MG capsule Take 100 mg daily as needed by mouth for mild constipation.   Yes [provider]  fluticasone (FLONASE) 50 MCG/ACT nasal spray Place 1 spray daily as needed into both nostrils for allergies or rhinitis.   Yes [provider]  levothyroxine (SYNTHROID,  LEVOTHROID) 125 MCG tablet Take 125 mcg at bedtime by mouth.    Yes [provider]  meclizine (ANTIVERT) 25 MG tablet Take 25 mg by mouth daily as needed for dizziness or nausea.    Yes [provider]  sertraline (ZOLOFT) 50 MG tablet Take 25 mg by mouth daily.   Yes [provider]  vitamin B-12 (CYANOCOBALAMIN) 1000 MCG tablet Take 1,000 mcg daily by mouth.   Yes [provider]  warfarin (COUMADIN) 4 MG tablet Take 4 mg at bedtime by mouth.    Yes [provider]    Family History No family history on file.  Social History Social History   Tobacco Use  . Smoking status: Never Smoker  . Smokeless tobacco: Never Used  Substance Use Topics  . Alcohol use: No  . Drug use: No     Allergies   Penicillins   Review of Systems Review of Systems  Constitutional: Negative for chills, diaphoresis and fever.  Respiratory: Positive for shortness of breath. Negative for cough.   Cardiovascular: Negative for chest pain.  Gastrointestinal: Negative for abdominal pain, nausea and vomiting.  Musculoskeletal: Positive for arthralgias, back pain and joint swelling. Negative for neck pain.  Neurological: Negative for dizziness, syncope, weakness, light-headedness, numbness and headaches.  All other systems reviewed and are negative.    Physical Exam Updated Vital Signs BP (!) 148/68 (BP Location: Left Arm)   Pulse 76   Temp (!) 97.5 F (36.4 C) (Oral)   Resp 14   Ht 5' (1.524 m)   Wt 63.5 kg   SpO2 97%   BMI 27.34 kg/m   Physical Exam  Constitutional: She is oriented to person, place, and time. She appears well-developed and well-nourished. No distress.  HENT:  Head: Normocephalic.  No noted tenderness, swelling, color change, crepitus, or deformity noted to the face or scalp.  Eyes: Conjunctivae are normal.  Neck: Neck supple.  Cardiovascular: Normal rate, regular rhythm, normal heart sounds and intact distal pulses.    Pulmonary/Chest: Effort normal and breath sounds normal. No respiratory distress. She exhibits tenderness.    Abdominal: Soft. There is no tenderness. There is no guarding.  Musculoskeletal: She exhibits edema and tenderness.  Tenderness with swelling noted over the right midshaft clavicle as well as the anterior right shoulder.  Tenderness extends to the superior right shoulder and over the right scapula without other scapular abnormalities noted. Movement in the right shoulder is limited due to pain.  Full range of motion in the right wrist and elbow without difficulty or pain. Normal motor function noted in each of the major joints of the other extremities without pain or noted difficulty.  No midline spinal tenderness, deformities, step-off, or swelling.   Lymphadenopathy:    She has no cervical adenopathy.  Neurological: She is alert and oriented to person, place, and time.  Motor function intact in  each of the patient's extremities.   Sensation light touch grossly intact throughout each of the extremities. Cranial nerves III through XII grossly intact.  Skin: Skin is warm and dry. She is not diaphoretic.  Psychiatric: She has a normal mood and affect. Her behavior is normal.  Nursing note and vitals reviewed.    ED Treatments / Results  Labs (all labs ordered are listed, but only abnormal results are displayed) Labs Reviewed  PROTIME-INR - Abnormal; Notable for the following components:      Result Value   Prothrombin Time 26.0 (*)    All other components within normal limits  COMPREHENSIVE METABOLIC PANEL - Abnormal; Notable for the following components:   Glucose, Bld 156 (*)    BUN 35 (*)    Creatinine, Ser 1.11 (*)    GFR calc non Af Amer 44 (*)    GFR calc Af Amer 51 (*)    All other components within normal limits  CBC WITH DIFFERENTIAL/PLATELET - Abnormal; Notable for the following components:   WBC 12.0 (*)    Neutro Abs 10.2 (*)    Lymphs Abs 0.6 (*)     Monocytes Absolute 1.1 (*)    All other components within normal limits  URINALYSIS, ROUTINE W REFLEX MICROSCOPIC - Abnormal; Notable for the following components:   Specific Gravity, Urine 1.031 (*)    Hgb urine dipstick SMALL (*)    All other components within normal limits  I-STAT TROPONIN, ED    EKG EKG Interpretation  Date/Time:  Sunday November 20 2017 07:59:35 EDT Ventricular Rate:  51 PR Interval:    QRS Duration: 176 QT Interval:  508 QTC Calculation: 468 R Axis:   -13 Text Interpretation:  Atrial fibrillation Right bundle branch block LVH with IVCD and secondary repol abnrm Inferior infarct, old Pacemaker with some ventricular paced beats Confirmed by Fredia Sorrow [phone]) on 11/20/2017 8:07:05 AM   Radiology Ct Head Wo Contrast  Result Date: 11/20/2017 CLINICAL DATA:  Fall, head and neck injury, right clavicle fracture EXAM: CT HEAD WITHOUT CONTRAST CT CERVICAL SPINE WITHOUT CONTRAST TECHNIQUE: Multidetector CT imaging of the head and cervical spine was performed following the standard protocol without intravenous contrast. Multiplanar CT image reconstructions of the cervical spine were also generated. COMPARISON:  None. FINDINGS: CT HEAD FINDINGS Brain: Brain atrophy noted with chronic white matter microvascular ischemic changes throughout both cerebral hemispheres. No acute intracranial hemorrhage, mass lesion, midline shift, or shin, herniation, or extra-axial fluid collection. Normal gray-white matter differentiation. Cisterns are patent. Cerebellar atrophy as well. Vascular: Cranial atherosclerosis noted.  No hyperdense vessel. Skull: Normal. Negative for fracture or focal lesion. Sinuses/Orbits: No acute finding. Other: None. CT CERVICAL SPINE FINDINGS Alignment: Normal. Skull base and vertebrae: Intact cervical spine. No cervical spine fracture, subluxation, or dislocation. Multilevel degenerative spondylosis and facet arthropathy of the cervical spine. Soft tissues and  spinal canal: No prevertebral fluid or swelling. No visible canal hematoma. Disc levels: Multilevel degenerative spondylosis with disc space narrowing, sclerosis and endplate osteophytes. Degenerative changes of the C1-2 articulation. Preserved vertebral body heights. Upper chest: Acute minimally comminuted displaced right mid clavicle fracture noted with surrounding mild hematoma. Degenerative changes of the shoulders. Clear lung apices. Other: Carotid atherosclerosis noted. IMPRESSION: Brain atrophy and chronic white matter microvascular ischemic changes. No acute intracranial abnormality by noncontrast CT. Cervical degenerative spondylosis and facet arthropathy without acute osseous finding or fracture Acute displaced right mid clavicle fracture Electronically Signed   By: Jerilynn Mages.  Shick M.D.   On:  11/20/2017 10:38   Ct Chest W Contrast  Result Date: 11/20/2017 CLINICAL DATA:  Status post fall this morning. Right clavicle pain. Initial encounter. EXAM: CT CHEST WITH CONTRAST TECHNIQUE: Multidetector CT imaging of the chest was performed during intravenous contrast administration. CONTRAST:  100 mL ISOVUE-370 IOPAMIDOL (ISOVUE-370) INJECTION 76% COMPARISON:  Plain film of the chest today. FINDINGS: Cardiovascular: There is marked cardiomegaly. Calcific aortic and coronary atherosclerosis is noted. No pericardial effusion. No aortic aneurysm. Mediastinum/Nodes: No enlarged mediastinal, hilar, or axillary lymph nodes. Thyroid gland, trachea, and esophagus demonstrate no significant findings. Lungs/Pleura: No pleural effusion. No pneumothorax. The lungs are clear. Upper Abdomen: Contrast refluxes into a dilated inferior vena cava in the patent veins consistent with right heart insufficiency. Musculoskeletal: The patient has an acute segmental fracture of the diaphysis of the right clavicle. The medial fracture line is angulated approximately 90 degrees with the segmental fracture fragment oriented almost directly  posteriorly. More lateral fracture line is also oriented approximately 90 degrees. No other acute fracture is seen. There is exaggeration of the normal thoracic kyphosis. No lytic or sclerotic lesion is identified. IMPRESSION: Acute segmental right clavicle fracture as described above. No other acute abnormality. Marked cardiomegaly. Reflux of contrast into the inferior vena cava and hepatic veins is compatible with right heart insufficiency. Calcific coronary artery disease. Aortic Atherosclerosis (ICD10-I70.0). Electronically Signed   By: Inge Rise M.D.   On: 11/20/2017 10:33   Ct Cervical Spine Wo Contrast  Result Date: 11/20/2017 CLINICAL DATA:  Fall, head and neck injury, right clavicle fracture EXAM: CT HEAD WITHOUT CONTRAST CT CERVICAL SPINE WITHOUT CONTRAST TECHNIQUE: Multidetector CT imaging of the head and cervical spine was performed following the standard protocol without intravenous contrast. Multiplanar CT image reconstructions of the cervical spine were also generated. COMPARISON:  None. FINDINGS: CT HEAD FINDINGS Brain: Brain atrophy noted with chronic white matter microvascular ischemic changes throughout both cerebral hemispheres. No acute intracranial hemorrhage, mass lesion, midline shift, or shin, herniation, or extra-axial fluid collection. Normal gray-white matter differentiation. Cisterns are patent. Cerebellar atrophy as well. Vascular: Cranial atherosclerosis noted.  No hyperdense vessel. Skull: Normal. Negative for fracture or focal lesion. Sinuses/Orbits: No acute finding. Other: None. CT CERVICAL SPINE FINDINGS Alignment: Normal. Skull base and vertebrae: Intact cervical spine. No cervical spine fracture, subluxation, or dislocation. Multilevel degenerative spondylosis and facet arthropathy of the cervical spine. Soft tissues and spinal canal: No prevertebral fluid or swelling. No visible canal hematoma. Disc levels: Multilevel degenerative spondylosis with disc space  narrowing, sclerosis and endplate osteophytes. Degenerative changes of the C1-2 articulation. Preserved vertebral body heights. Upper chest: Acute minimally comminuted displaced right mid clavicle fracture noted with surrounding mild hematoma. Degenerative changes of the shoulders. Clear lung apices. Other: Carotid atherosclerosis noted. IMPRESSION: Brain atrophy and chronic white matter microvascular ischemic changes. No acute intracranial abnormality by noncontrast CT. Cervical degenerative spondylosis and facet arthropathy without acute osseous finding or fracture Acute displaced right mid clavicle fracture Electronically Signed   By: Jerilynn Mages.  Shick M.D.   On: 11/20/2017 10:38   Dg Pelvis Portable  Result Date: 11/20/2017 CLINICAL DATA:  Fall. EXAM: PORTABLE PELVIS 1-2 VIEWS COMPARISON:  CT abdomen pelvis dated September 19, 2017. FINDINGS: No acute fracture or dislocation. The pubic symphysis and sacroiliac joints are intact. Unchanged moderate right hip joint space narrowing with large subchondral cysts and large femoral head osteophytes. Unchanged mild left hip joint space narrowing. Diffuse osteopenia. Soft tissues are unremarkable. IMPRESSION: 1. No acute osseous abnormality. 2. Moderate right and  mild left hip osteoarthritis. Electronically Signed   By: Titus Dubin M.D.   On: 11/20/2017 09:04   Dg Chest Portable 1 View  Result Date: 11/20/2017 CLINICAL DATA:  Fall.  Right clavicle pain. EXAM: PORTABLE CHEST 1 VIEW COMPARISON:  Chest x-ray dated November 10, 2017. FINDINGS: Unchanged single lead right chest wall pacemaker. Stable cardiomegaly. Atherosclerotic calcification of the aortic arch. Normal pulmonary vascularity. The lungs remain mildly hyperinflated. Chronic interstitial changes are similar to prior study. No focal consolidation, pleural effusion, or pneumothorax. Acute fracture of the right mid to distal clavicle with almost 2 cm of overlapping fragments. IMPRESSION: 1. Acute right mid to distal  clavicle fracture with 2 cm of overlapping fragments. 2. COPD and chronic interstitial changes. No active cardiopulmonary disease. Electronically Signed   By: Titus Dubin M.D.   On: 11/20/2017 09:01    Procedures Procedures (including critical care time)  Medications Ordered in ED Medications  iopamidol (ISOVUE-300) 61 % injection (has no administration in time range)  morphine 4 MG/ML injection 4 mg (4 mg Intravenous Given 11/20/17 0918)  ondansetron (ZOFRAN) injection 4 mg (4 mg Intravenous Given 11/20/17 0915)  iopamidol (ISOVUE-370) 76 % injection 100 mL (100 mLs Intravenous Contrast Given 11/20/17 1021)  morphine 4 MG/ML injection 4 mg (4 mg Intravenous Given 11/20/17 1110)     Initial Impression / Assessment and Plan / ED Course  I have reviewed the triage vital signs and the nursing notes.  Pertinent labs & imaging results that were available during my care of the patient were reviewed by me and considered in my medical decision making (see chart for details).  Clinical Course as of Nov 20 1356  Sun Nov 20, 2017  1055 Patient states her pain has again increased after the movement incurred during CT.   [SJ]  1214 Spoke with Dr. Onnie Graham, orthopedic surgeon on-call for [facility].  Place patient into an arm sling.  Have her call the office Tuesday morning to be seen Tuesday afternoon.   [SJ]  23 Spoke with Dr. Nevada Crane, hospitalist. Agrees to admit the patient.   [SJ]    Clinical Course User Index [SJ] Darria Corvera C, PA-C    Patient presents following a fall that she states was mechanical.  Evidence of displaced right clavicle fracture, but no other acute abnormalities noted.  Her cardiomegaly has been previously noted on x-rays. Patient had multiple doses of narcotic pain medication and pain was intermittently controlled for short amounts of time.  She will be admitted for pain management and observation.  Findings and plan of care discussed with Fredia Sorrow, MD. Dr. Rogene Houston  personally evaluated and examined this patient.  Vitals:   11/20/17 1045 11/20/17 1130 11/20/17 1200 11/20/17 1330  BP: (!) 149/92 (!) 148/75 (!) 149/81 (!) 150/99  Pulse: 60 (!) 58 60 69  Resp: 16 16 10 18   Temp:      TempSrc:      SpO2: 97% 94% 97% 100%  Weight:      Height:         Final Clinical Impressions(s) / ED Diagnoses   Final diagnoses:  Fall, initial encounter  Closed displaced fracture of shaft of right clavicle, initial encounter    ED Discharge Orders    None       Layla Maw 11/20/17 1401    Fredia Sorrow, MD 11/20/17 1625

## 2017-11-20 NOTE — H&P (Signed)
History and Physical  [name] IZT:[id number] DOB: [birth year] DOA: 11/20/2017  Referring physician: Dr Caryl Asp  PCP: Burnard Bunting, MD  Outpatient Specialists: Cardiology Dr. Wynonia Lawman Patient coming from: Home  Chief Complaint: Fall  HPI: [name] is a 84 y.o. female with medical history significant for chronic atrial fibrillation on Coumadin, post cardiac pacemaker, hypothyroidism, chronic depression/anxiety who presented to ED [facility] from home after a fall at home earlier this morning.  Patient is independent of her ADLs.  Patient reports she tripped.  She denies any dizziness, chest pain, palpitations, dyspnea or any other symptoms prior to falling.  States the night before she had been moving boxes for a new handicap accessible home she is remodeling.  States she was in her usual state of health prior to this.  However she has been having recurrent mechanical falls within the last few months.  Denies hitting her head.  Fell on her right shoulder and reports severe pain.  ED Course: Accelerated hypertension on presentation most likely contributed by severe right shoulder pain.  Right shoulder x-ray revealed acute right mid to distal clavicle fracture with 2 cm overlapping fragments.  Orthopedic surgery, Dr. Onnie Graham, consulted by ED physician.  CT head no contrast unremarkable for any acute intracranial findings.  Admitted as observation status due to severe pain requiring IV pain medications.  Review of Systems: Review of systems as noted in the HPI. All other systems reviewed and are negative.   Past Medical History:  Diagnosis Date  . Arrhythmia    chronic atrial fib  . Atrial fibrillation, chronic ([place])   . Chronic anticoagulation   . Chronic atrial fibrillation ([place]) 01/28/2017  . Chronic venous insufficiency 01/28/2017  . Long term current use of anticoagulant therapy 01/28/2017  . Osteoarthritis   . Presence of permanent cardiac pacemaker 01/28/2017   Original  implant 1992 [employer] 431-04 Generator change 2001 and 2011.  Medtronic generator.   . SSS (sick sinus syndrome) ([place])   . Thyroid disease    hypothyroidism   Past Surgical History:  Procedure Laterality Date  . ABDOMINAL HYSTERECTOMY    . INSERT / REPLACE / REMOVE PACEMAKER     generator change 2011 medtronic sigma  . KNEE SURGERY    . PACEMAKER INSERTION    . PPM GENERATOR CHANGEOUT N/A 02/02/2017   Procedure: PPM GENERATOR CHANGEOUT;  Surgeon: Deboraha Sprang, MD;  Location: [facility];  Service: Cardiovascular;  Laterality: N/A;  . REPLACEMENT TOTAL KNEE BILATERAL    . VARICOSE VEIN SURGERY      Social History:  reports that she has never smoked. She has never used smokeless tobacco. She reports that she does not drink alcohol or use drugs.   Allergies  Allergen Reactions  . Penicillins Hives and Rash    Has patient had a PCN reaction causing immediate rash, facial/tongue/throat swelling, SOB or lightheadedness with hypotension: unkn Has patient had a PCN reaction causing severe rash involving mucus membranes or skin necrosis: unkn Has patient had a PCN reaction that required hospitalization: unkn Has patient had a PCN reaction occurring within the last 10 years: unkn If all of the above answers are "NO", then may proceed with Cephalosporin use.     No family history on file.  Mother and father with history of diabetes.  Father deceased with complications from heart failure and mother deceased with complications from diabetes.  Prior to Admission medications   Medication Sig Start Date End Date Taking? Authorizing Provider  acetaminophen (TYLENOL) 500 MG tablet Take 1,000 mg every 8 (eight) hours as needed by mouth for mild pain or moderate pain.   Yes [provider]  amLODipine (NORVASC) 5 MG tablet Take 5 mg by mouth daily.   Yes [provider]  Bepotastine Besilate (BEPREVE OP) Place 1 drop 2 (two) times daily as needed into both eyes  (eye allergies).   Yes [provider]  celecoxib (CELEBREX) 200 MG capsule Take 200 mg at bedtime by mouth.    Yes [provider]  cholecalciferol (VITAMIN D) 1000 units tablet Take 1,000 Units daily by mouth.   Yes [provider]  digoxin (LANOXIN) 0.125 MG tablet Take 0.125 mg every morning by mouth.   Yes [provider]  diphenhydrAMINE (BENADRYL) 25 MG tablet Take 50 mg by mouth every 6 (six) hours as needed (for allergic reaction).    Yes [provider]  docusate sodium (COLACE) 100 MG capsule Take 100 mg daily as needed by mouth for mild constipation.   Yes [provider]  fluticasone (FLONASE) 50 MCG/ACT nasal spray Place 1 spray daily as needed into both nostrils for allergies or rhinitis.   Yes [provider]  levothyroxine (SYNTHROID, LEVOTHROID) 125 MCG tablet Take 125 mcg at bedtime by mouth.    Yes [provider]  meclizine (ANTIVERT) 25 MG tablet Take 25 mg by mouth daily as needed for dizziness or nausea.    Yes [provider]  sertraline (ZOLOFT) 50 MG tablet Take 25 mg by mouth daily.   Yes [provider]  vitamin B-12 (CYANOCOBALAMIN) 1000 MCG tablet Take 1,000 mcg daily by mouth.   Yes [provider]  warfarin (COUMADIN) 4 MG tablet Take 4 mg at bedtime by mouth.    Yes [provider]    Physical Exam: BP (!) 157/85   Pulse 64   Temp (!) 97.5 F (36.4 C) (Oral)   Resp 13   Ht 5' (1.524 m)   Wt 63.5 kg   SpO2 98%   BMI 27.34 kg/m   . General: 84 y.o. year-old female well developed well nourished in no acute distress.  Alert and oriented x3. . Cardiovascular: Irregular rate and rhythm with no rubs or gallops.  No thyromegaly or JVD noted.  No lower extremity edema. 2/4 pulses in all 4 extremities. Marland Kitchen Respiratory: Clear to auscultation with no wheezes or rales. Good inspiratory effort. . Abdomen: Soft nontender nondistended with normal bowel sounds x4  quadrants. . Muskuloskeletal: No cyanosis, clubbing noted bilaterally.  Right clavicle swelling from fracture.  Right arm in a sling.  Scars noted on knees bilaterally from previous surgeries. . Neuro: CN II-XII intact, strength, sensation, reflexes . Skin: No ulcerative lesions noted or rashes . Psychiatry: Judgement and insight appear normal. Mood is appropriate for condition and setting          Labs on Admission:  Basic Metabolic Panel: Recent Labs  Lab 11/20/17 0828  NA 138  K 3.9  CL 101  CO2 26  GLUCOSE 156*  BUN 35*  CREATININE 1.11*  CALCIUM 9.2   Liver Function Tests: Recent Labs  Lab 11/20/17 0828  AST 26  ALT 21  ALKPHOS 77  BILITOT 1.0  PROT 7.3  ALBUMIN 4.0   No results for input(s): LIPASE, AMYLASE in the last 168 hours. No results for input(s): AMMONIA in the last 168 hours. CBC: Recent Labs  Lab 11/20/17 0828  WBC 12.0*  NEUTROABS 10.2*  HGB  13.3  HCT 41.1  MCV 86.3  PLT 260   Cardiac Enzymes: No results for input(s): CKTOTAL, CKMB, CKMBINDEX, TROPONINI in the last 168 hours.  BNP (last 3 results) No results for input(s): BNP in the last 8760 hours.  ProBNP (last 3 results) No results for input(s): PROBNP in the last 8760 hours.  CBG: No results for input(s): GLUCAP in the last 168 hours.  Radiological Exams on Admission: Ct Head Wo Contrast  Result Date: 11/20/2017 CLINICAL DATA:  Fall, head and neck injury, right clavicle fracture EXAM: CT HEAD WITHOUT CONTRAST CT CERVICAL SPINE WITHOUT CONTRAST TECHNIQUE: Multidetector CT imaging of the head and cervical spine was performed following the standard protocol without intravenous contrast. Multiplanar CT image reconstructions of the cervical spine were also generated. COMPARISON:  None. FINDINGS: CT HEAD FINDINGS Brain: Brain atrophy noted with chronic white matter microvascular ischemic changes throughout both cerebral hemispheres. No acute intracranial hemorrhage, mass lesion, midline  shift, or shin, herniation, or extra-axial fluid collection. Normal gray-white matter differentiation. Cisterns are patent. Cerebellar atrophy as well. Vascular: Cranial atherosclerosis noted.  No hyperdense vessel. Skull: Normal. Negative for fracture or focal lesion. Sinuses/Orbits: No acute finding. Other: None. CT CERVICAL SPINE FINDINGS Alignment: Normal. Skull base and vertebrae: Intact cervical spine. No cervical spine fracture, subluxation, or dislocation. Multilevel degenerative spondylosis and facet arthropathy of the cervical spine. Soft tissues and spinal canal: No prevertebral fluid or swelling. No visible canal hematoma. Disc levels: Multilevel degenerative spondylosis with disc space narrowing, sclerosis and endplate osteophytes. Degenerative changes of the C1-2 articulation. Preserved vertebral body heights. Upper chest: Acute minimally comminuted displaced right mid clavicle fracture noted with surrounding mild hematoma. Degenerative changes of the shoulders. Clear lung apices. Other: Carotid atherosclerosis noted. IMPRESSION: Brain atrophy and chronic white matter microvascular ischemic changes. No acute intracranial abnormality by noncontrast CT. Cervical degenerative spondylosis and facet arthropathy without acute osseous finding or fracture Acute displaced right mid clavicle fracture Electronically Signed   By: Jerilynn Mages.  Shick M.D.   On: 11/20/2017 10:38   Ct Chest W Contrast  Result Date: 11/20/2017 CLINICAL DATA:  Status post fall this morning. Right clavicle pain. Initial encounter. EXAM: CT CHEST WITH CONTRAST TECHNIQUE: Multidetector CT imaging of the chest was performed during intravenous contrast administration. CONTRAST:  100 mL ISOVUE-370 IOPAMIDOL (ISOVUE-370) INJECTION 76% COMPARISON:  Plain film of the chest today. FINDINGS: Cardiovascular: There is marked cardiomegaly. Calcific aortic and coronary atherosclerosis is noted. No pericardial effusion. No aortic aneurysm. Mediastinum/Nodes:  No enlarged mediastinal, hilar, or axillary lymph nodes. Thyroid gland, trachea, and esophagus demonstrate no significant findings. Lungs/Pleura: No pleural effusion. No pneumothorax. The lungs are clear. Upper Abdomen: Contrast refluxes into a dilated inferior vena cava in the patent veins consistent with right heart insufficiency. Musculoskeletal: The patient has an acute segmental fracture of the diaphysis of the right clavicle. The medial fracture line is angulated approximately 90 degrees with the segmental fracture fragment oriented almost directly posteriorly. More lateral fracture line is also oriented approximately 90 degrees. No other acute fracture is seen. There is exaggeration of the normal thoracic kyphosis. No lytic or sclerotic lesion is identified. IMPRESSION: Acute segmental right clavicle fracture as described above. No other acute abnormality. Marked cardiomegaly. Reflux of contrast into the inferior vena cava and hepatic veins is compatible with right heart insufficiency. Calcific coronary artery disease. Aortic Atherosclerosis (ICD10-I70.0). Electronically Signed   By: Inge Rise M.D.   On: 11/20/2017 10:33   Ct Cervical Spine Wo Contrast  Result Date: 11/20/2017  CLINICAL DATA:  Fall, head and neck injury, right clavicle fracture EXAM: CT HEAD WITHOUT CONTRAST CT CERVICAL SPINE WITHOUT CONTRAST TECHNIQUE: Multidetector CT imaging of the head and cervical spine was performed following the standard protocol without intravenous contrast. Multiplanar CT image reconstructions of the cervical spine were also generated. COMPARISON:  None. FINDINGS: CT HEAD FINDINGS Brain: Brain atrophy noted with chronic white matter microvascular ischemic changes throughout both cerebral hemispheres. No acute intracranial hemorrhage, mass lesion, midline shift, or shin, herniation, or extra-axial fluid collection. Normal gray-white matter differentiation. Cisterns are patent. Cerebellar atrophy as well.  Vascular: Cranial atherosclerosis noted.  No hyperdense vessel. Skull: Normal. Negative for fracture or focal lesion. Sinuses/Orbits: No acute finding. Other: None. CT CERVICAL SPINE FINDINGS Alignment: Normal. Skull base and vertebrae: Intact cervical spine. No cervical spine fracture, subluxation, or dislocation. Multilevel degenerative spondylosis and facet arthropathy of the cervical spine. Soft tissues and spinal canal: No prevertebral fluid or swelling. No visible canal hematoma. Disc levels: Multilevel degenerative spondylosis with disc space narrowing, sclerosis and endplate osteophytes. Degenerative changes of the C1-2 articulation. Preserved vertebral body heights. Upper chest: Acute minimally comminuted displaced right mid clavicle fracture noted with surrounding mild hematoma. Degenerative changes of the shoulders. Clear lung apices. Other: Carotid atherosclerosis noted. IMPRESSION: Brain atrophy and chronic white matter microvascular ischemic changes. No acute intracranial abnormality by noncontrast CT. Cervical degenerative spondylosis and facet arthropathy without acute osseous finding or fracture Acute displaced right mid clavicle fracture Electronically Signed   By: Jerilynn Mages.  Shick M.D.   On: 11/20/2017 10:38   Dg Pelvis Portable  Result Date: 11/20/2017 CLINICAL DATA:  Fall. EXAM: PORTABLE PELVIS 1-2 VIEWS COMPARISON:  CT abdomen pelvis dated September 19, 2017. FINDINGS: No acute fracture or dislocation. The pubic symphysis and sacroiliac joints are intact. Unchanged moderate right hip joint space narrowing with large subchondral cysts and large femoral head osteophytes. Unchanged mild left hip joint space narrowing. Diffuse osteopenia. Soft tissues are unremarkable. IMPRESSION: 1. No acute osseous abnormality. 2. 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 Electronically Signed   By: Titus Dubin M.D.   On: 11/20/2017 09:04   Dg Chest Portable 1 View  Result Date: 11/20/2017 CLINICAL DATA:   Fall.  Right clavicle pain. EXAM: PORTABLE CHEST 1 VIEW COMPARISON:  Chest x-ray dated November 10, 2017. FINDINGS: Unchanged single lead right chest wall pacemaker. Stable cardiomegaly. Atherosclerotic calcification of the aortic arch. Normal pulmonary vascularity. The lungs remain mildly hyperinflated. Chronic interstitial changes are similar to prior study. No focal consolidation, pleural effusion, or pneumothorax. Acute fracture of the right mid to distal clavicle with almost 2 cm of overlapping fragments. IMPRESSION: 1. Acute right mid to distal clavicle fracture with 2 cm of overlapping fragments. 2. COPD and chronic interstitial changes. No active cardiopulmonary disease. Electronically Signed   By: Titus Dubin M.D.   On: 11/20/2017 09:01    EKG: I independently viewed the EKG done and my findings are as followed: A. Fib, RBB, ventricular paced with rate of 51.  Assessment/Plan Present on Admission: . Clavicle fracture  Active Problems:   Clavicle fracture  Acute right clavicle fracture status post mechanical fall Pain management in place with OxyContin 10 mg twice daily IV Dilaudid 1 mg every 4 hours as needed for severe pain Bowel regimen with Senokot 1 tablet twice daily and MiraLAX 17 g daily Dr. Onnie Graham of orthopedic surgery consulted by ED physician Has a sling in place Hold Coumadin  Ambulatory dysfunction status post mechanical fall At baseline ambulates  with a walker and a cane CT head no contrast unremarkable-independently reviewed and negative for any acute intracranial findings Fall precautions PT to assess  Chronic A. fib on Coumadin Hold Coumadin due to acute fracture INR is therapeutic on admission Troponin 0.02 Continue remote telemetry on MedSurg On digoxin  Hypothyroidism Continue levothyroxine  Accelerated hypertension Suspect contributed by pain Control pain Resume antihypertensive medications Will increase amlodipine to 10 mg if  persistent Continue to monitor vital signs  Leukocytosis Suspect reactive from fracture WBC 12.0 No sign of active infective process Afebrile Negative UA Chest x-ray independently reviewed revealed no lobular infiltrates Repeat CBC in the morning  CKD 3 Baseline creatinine 0.9 with GFR 57 Creatinine 1.11 on presentation Start gentle IV hydration normal saline at 50 cc/h Monitor urine output Repeat BMP in the morning  Post permanent cardiac pacemaker Appears stable Follows with Dr. Wynonia Lawman outpatient  Asymptomatic bradycardia Has AICD and ventricular paced Follows with cardiology outpatient, Dr. Wynonia Lawman Consult cardiology if becomes problematic  Chronic depression/anxiety Continue Zoloft   DVT prophylaxis: Previously on Coumadin- hold Coumadin due to acute right clavicle fracture; SCDs  Code Status: Full code  Family Communication: Son at bedside  Disposition Plan: Admit to [facility] with remote telemetry  Consults called: Orthopedic surgery Dr. Onnie Graham consulted by ED physician  Admission status: Observation status    Kayleen Memos MD [facility] Pager [phone]  If 7PM-7AM, please contact night-coverage [email or web address] Password [email or web address]  11/20/2017, 2:52 PM

## 2017-11-21 ENCOUNTER — Observation Stay (HOSPITAL_COMMUNITY): Payer: Medicare Other

## 2017-11-21 ENCOUNTER — Encounter (HOSPITAL_COMMUNITY): Payer: Self-pay | Admitting: General Practice

## 2017-11-21 DIAGNOSIS — Z79890 Hormone replacement therapy (postmenopausal): Secondary | ICD-10-CM | POA: Diagnosis not present

## 2017-11-21 DIAGNOSIS — Z950 Presence of cardiac pacemaker: Secondary | ICD-10-CM | POA: Diagnosis not present

## 2017-11-21 DIAGNOSIS — Z743 Need for continuous supervision: Secondary | ICD-10-CM | POA: Diagnosis not present

## 2017-11-21 DIAGNOSIS — R0902 Hypoxemia: Secondary | ICD-10-CM | POA: Diagnosis not present

## 2017-11-21 DIAGNOSIS — Z7901 Long term (current) use of anticoagulants: Secondary | ICD-10-CM | POA: Diagnosis not present

## 2017-11-21 DIAGNOSIS — R41841 Cognitive communication deficit: Secondary | ICD-10-CM | POA: Diagnosis not present

## 2017-11-21 DIAGNOSIS — R531 Weakness: Secondary | ICD-10-CM | POA: Diagnosis not present

## 2017-11-21 DIAGNOSIS — N183 Chronic kidney disease, stage 3 (moderate): Secondary | ICD-10-CM | POA: Diagnosis present

## 2017-11-21 DIAGNOSIS — S6991XA Unspecified injury of right wrist, hand and finger(s), initial encounter: Secondary | ICD-10-CM | POA: Diagnosis not present

## 2017-11-21 DIAGNOSIS — R5381 Other malaise: Secondary | ICD-10-CM | POA: Diagnosis present

## 2017-11-21 DIAGNOSIS — M25531 Pain in right wrist: Secondary | ICD-10-CM | POA: Diagnosis present

## 2017-11-21 DIAGNOSIS — E039 Hypothyroidism, unspecified: Secondary | ICD-10-CM | POA: Diagnosis present

## 2017-11-21 DIAGNOSIS — F419 Anxiety disorder, unspecified: Secondary | ICD-10-CM | POA: Diagnosis present

## 2017-11-21 DIAGNOSIS — W19XXXD Unspecified fall, subsequent encounter: Secondary | ICD-10-CM | POA: Diagnosis not present

## 2017-11-21 DIAGNOSIS — R296 Repeated falls: Secondary | ICD-10-CM | POA: Diagnosis present

## 2017-11-21 DIAGNOSIS — R278 Other lack of coordination: Secondary | ICD-10-CM | POA: Diagnosis not present

## 2017-11-21 DIAGNOSIS — M25511 Pain in right shoulder: Secondary | ICD-10-CM | POA: Diagnosis not present

## 2017-11-21 DIAGNOSIS — R42 Dizziness and giddiness: Secondary | ICD-10-CM | POA: Diagnosis not present

## 2017-11-21 DIAGNOSIS — R001 Bradycardia, unspecified: Secondary | ICD-10-CM | POA: Diagnosis present

## 2017-11-21 DIAGNOSIS — W19XXXA Unspecified fall, initial encounter: Secondary | ICD-10-CM | POA: Diagnosis not present

## 2017-11-21 DIAGNOSIS — T7840XD Allergy, unspecified, subsequent encounter: Secondary | ICD-10-CM | POA: Diagnosis not present

## 2017-11-21 DIAGNOSIS — Z96653 Presence of artificial knee joint, bilateral: Secondary | ICD-10-CM | POA: Diagnosis present

## 2017-11-21 DIAGNOSIS — F329 Major depressive disorder, single episode, unspecified: Secondary | ICD-10-CM | POA: Diagnosis present

## 2017-11-21 DIAGNOSIS — S42021A Displaced fracture of shaft of right clavicle, initial encounter for closed fracture: Secondary | ICD-10-CM | POA: Diagnosis present

## 2017-11-21 DIAGNOSIS — R2681 Unsteadiness on feet: Secondary | ICD-10-CM | POA: Diagnosis not present

## 2017-11-21 DIAGNOSIS — I482 Chronic atrial fibrillation: Secondary | ICD-10-CM | POA: Diagnosis present

## 2017-11-21 DIAGNOSIS — R0609 Other forms of dyspnea: Secondary | ICD-10-CM | POA: Diagnosis not present

## 2017-11-21 DIAGNOSIS — I10 Essential (primary) hypertension: Secondary | ICD-10-CM | POA: Diagnosis not present

## 2017-11-21 DIAGNOSIS — R279 Unspecified lack of coordination: Secondary | ICD-10-CM | POA: Diagnosis not present

## 2017-11-21 DIAGNOSIS — E871 Hypo-osmolality and hyponatremia: Secondary | ICD-10-CM | POA: Diagnosis present

## 2017-11-21 DIAGNOSIS — S42001D Fracture of unspecified part of right clavicle, subsequent encounter for fracture with routine healing: Secondary | ICD-10-CM | POA: Diagnosis not present

## 2017-11-21 DIAGNOSIS — W010XXA Fall on same level from slipping, tripping and stumbling without subsequent striking against object, initial encounter: Secondary | ICD-10-CM | POA: Diagnosis present

## 2017-11-21 DIAGNOSIS — I129 Hypertensive chronic kidney disease with stage 1 through stage 4 chronic kidney disease, or unspecified chronic kidney disease: Secondary | ICD-10-CM | POA: Diagnosis present

## 2017-11-21 DIAGNOSIS — K5900 Constipation, unspecified: Secondary | ICD-10-CM | POA: Diagnosis not present

## 2017-11-21 DIAGNOSIS — D72829 Elevated white blood cell count, unspecified: Secondary | ICD-10-CM | POA: Diagnosis present

## 2017-11-21 DIAGNOSIS — I071 Rheumatic tricuspid insufficiency: Secondary | ICD-10-CM | POA: Diagnosis present

## 2017-11-21 DIAGNOSIS — M6281 Muscle weakness (generalized): Secondary | ICD-10-CM | POA: Diagnosis not present

## 2017-11-21 DIAGNOSIS — I700 Atherosclerosis of aorta: Secondary | ICD-10-CM | POA: Diagnosis present

## 2017-11-21 DIAGNOSIS — Y92009 Unspecified place in unspecified non-institutional (private) residence as the place of occurrence of the external cause: Secondary | ICD-10-CM | POA: Diagnosis not present

## 2017-11-21 LAB — COMPREHENSIVE METABOLIC PANEL
ALT: 22 U/L (ref 0–44)
AST: 21 U/L (ref 15–41)
Albumin: 3.8 g/dL (ref 3.5–5.0)
Alkaline Phosphatase: 69 U/L (ref 38–126)
Anion gap: 10 (ref 5–15)
BUN: 22 mg/dL (ref 8–23)
CHLORIDE: 99 mmol/L (ref 98–111)
CO2: 29 mmol/L (ref 22–32)
CREATININE: 1 mg/dL (ref 0.44–1.00)
Calcium: 9.2 mg/dL (ref 8.9–10.3)
GFR calc Af Amer: 58 mL/min — ABNORMAL LOW (ref >60)
GFR, EST NON AFRICAN AMERICAN: 50 mL/min — AB (ref >60)
Glucose, Bld: 106 mg/dL — ABNORMAL HIGH (ref 70–99)
Potassium: 4.8 mmol/L (ref 3.5–5.1)
SODIUM: 138 mmol/L (ref 135–145)
Total Bilirubin: 1 mg/dL (ref 0.3–1.2)
Total Protein: 7.1 g/dL (ref 6.5–8.1)

## 2017-11-21 LAB — CBC WITH DIFFERENTIAL/PLATELET
ABS IMMATURE GRANULOCYTES: 0.1 10*3/uL (ref 0.0–0.1)
Basophils Absolute: 0 10*3/uL (ref 0.0–0.1)
Basophils Relative: 0 %
Eosinophils Absolute: 0 10*3/uL (ref 0.0–0.7)
Eosinophils Relative: 0 %
HEMATOCRIT: 39.5 % (ref 36.0–46.0)
HEMOGLOBIN: 12.7 g/dL (ref 12.0–15.0)
IMMATURE GRANULOCYTES: 0 %
LYMPHS ABS: 0.6 10*3/uL — AB (ref 0.7–4.0)
Lymphocytes Relative: 4 %
MCH: 27.9 pg (ref 26.0–34.0)
MCHC: 32.2 g/dL (ref 30.0–36.0)
MCV: 86.6 fL (ref 78.0–100.0)
MONOS PCT: 15 %
Monocytes Absolute: 2.1 10*3/uL — ABNORMAL HIGH (ref 0.1–1.0)
NEUTROS ABS: 11.1 10*3/uL — AB (ref 1.7–7.7)
NEUTROS PCT: 81 %
Platelets: 231 10*3/uL (ref 150–400)
RBC: 4.56 MIL/uL (ref 3.87–5.11)
RDW: 14.7 % (ref 11.5–15.5)
WBC: 13.8 10*3/uL — AB (ref 4.0–10.5)

## 2017-11-21 LAB — CBC
HCT: 43 % (ref 36.0–46.0)
Hemoglobin: 13.7 g/dL (ref 12.0–15.0)
MCH: 27.7 pg (ref 26.0–34.0)
MCHC: 31.9 g/dL (ref 30.0–36.0)
MCV: 86.9 fL (ref 78.0–100.0)
PLATELETS: 257 10*3/uL (ref 150–400)
RBC: 4.95 MIL/uL (ref 3.87–5.11)
RDW: 15.1 % (ref 11.5–15.5)
WBC: 11.7 10*3/uL — AB (ref 4.0–10.5)

## 2017-11-21 LAB — MAGNESIUM: MAGNESIUM: 2 mg/dL (ref 1.7–2.4)

## 2017-11-21 LAB — PROTIME-INR
INR: 2.15
PROTHROMBIN TIME: 23.9 s — AB (ref 11.4–15.2)

## 2017-11-21 MED ORDER — WARFARIN - PHARMACIST DOSING INPATIENT: Freq: Every day | Status: DC

## 2017-11-21 MED ORDER — OXYCODONE-ACETAMINOPHEN 5-325 MG PO TABS
1.0000 | ORAL_TABLET | ORAL | Status: DC | PRN
  Administered 2017-11-21: 2 via ORAL
  Filled 2017-11-21: qty 2

## 2017-11-21 MED ORDER — METHOCARBAMOL 500 MG PO TABS
500.0000 mg | ORAL_TABLET | Freq: Three times a day (TID) | ORAL | Status: DC | PRN
  Administered 2017-11-21 – 2017-11-23 (×3): 500 mg via ORAL
  Filled 2017-11-21 (×4): qty 1

## 2017-11-21 MED ORDER — WARFARIN SODIUM 4 MG PO TABS
4.0000 mg | ORAL_TABLET | Freq: Once | ORAL | Status: AC
  Administered 2017-11-21: 4 mg via ORAL
  Filled 2017-11-21: qty 1

## 2017-11-21 MED ORDER — OXYCODONE-ACETAMINOPHEN 5-325 MG PO TABS
1.0000 | ORAL_TABLET | ORAL | Status: DC | PRN
  Administered 2017-11-21 – 2017-11-22 (×2): 2 via ORAL
  Administered 2017-11-22 (×2): 1 via ORAL
  Administered 2017-11-23 – 2017-11-24 (×4): 2 via ORAL
  Filled 2017-11-21: qty 2
  Filled 2017-11-21: qty 1
  Filled 2017-11-21 (×6): qty 2

## 2017-11-21 NOTE — Social Work (Signed)
CSW acknowledging consult for SNF placement, pt is currently admitted as "observation," unless pt requires 3 midnight medically necessary stay pt unable to be admitted to SNF (unless pt family able to private pay), will continue to follow for pt disposition.   Alexander [place], [place] Work [phone]

## 2017-11-21 NOTE — Evaluation (Signed)
Occupational Therapy Evaluation Patient Details Name: [name] MRN: [id number] DOB: [birth year] Today's Date: 11/21/2017    History of Present Illness [name] is a 84 y.o. female with medical history significant for chronic atrial fibrillation on Coumadin, post cardiac pacemaker, hypothyroidism, chronic depression/anxiety who presented to ED [facility] from home after a fall at home earlier this morning. Fell on R shoulder with R clavicular fx.    Clinical Impression   This 84 yo female admitted with above presents to acute OT with increased pain, decreased use of RUE, decreased mobility, and decreased balance all affecting her PLOF of being totally independent to Mod I with all basic and IADLs. Pt presently min A -total A for all basic ADLs. She will benefit from acute OT with follow up OT at SNF.     Follow Up Recommendations  SNF;Supervision/Assistance - 24 hour    Equipment Recommendations  Other (comment)(TBD at next venue)       Precautions / Restrictions Precautions Precautions: Fall Precaution Comments: pt has fallen several times recently with other injuries Required Braces or Orthoses: Sling Restrictions Weight Bearing Restrictions: Yes RUE Weight Bearing: Non weight bearing Other Position/Activity Restrictions: no WB'ing orders specified in notes but kept pt NWB for pain control and while waiting for decision on sx      Mobility Bed Mobility Overal bed mobility: Needs Assistance Bed Mobility: Supine to Sit;Sit to Supine     Supine to sit: Min assist Sit to supine: Min assist    Transfers Overall transfer level: Needs assistance Equipment used: 1 person hand held assist Transfers: Sit to/from Stand Sit to Stand: Mod assist            Balance Overall balance assessment: Needs assistance Sitting-balance support: No upper extremity supported;Feet supported Sitting balance-Leahy Scale: Fair     Standing balance support: Single extremity  supported Standing balance-Leahy Scale: Poor Standing balance comment: very unsteady in standing without A                           ADL either performed or assessed with clinical judgement   ADL Overall ADL's : Needs assistance/impaired Eating/Feeding: Set up;Sitting   Grooming: Moderate assistance;Sitting   Upper Body Bathing: Moderate assistance;Sitting   Lower Body Bathing: Maximal assistance Lower Body Bathing Details (indicate cue type and reason): Mod A sit<>stand Upper Body Dressing : Maximal assistance;Sitting   Lower Body Dressing: Total assistance Lower Body Dressing Details (indicate cue type and reason): Mod A sit<>stand Toilet Transfer: Moderate assistance Toilet Transfer Details (indicate cue type and reason): 1 hand A side step up towards [place] and Hygiene: Total assistance Toileting - Clothing Manipulation Details (indicate cue type and reason): Mod A sit<>stand             Vision Patient Visual Report: No change from baseline              Pertinent Vitals/Pain Pain Assessment: 0-10 Pain Score: 5  Pain Location: R clavicle Pain Descriptors / Indicators: Sharp;Aching;Guarding Pain Intervention(s): Limited activity within patient's tolerance;Monitored during session;Repositioned     Hand Dominance Right   Extremity/Trunk Assessment Upper Extremity Assessment Upper Extremity Assessment: RUE deficits/detail RUE Deficits / Details: Right clavicle fx, pt also c/o right wrist/metacarpal pain ("I think they are going to xray it")--can open/close hand, bend wrist, partially supinate (but all with pain) RUE Coordination: decreased fine motor;decreased gross motor     Communication Communication Communication: No  difficulties   Cognition Arousal/Alertness: Awake/alert Behavior During Therapy: WFL for tasks assessed/performed Overall Cognitive Status: Within Functional Limits for tasks assessed                                                 Home Living Family/patient expects to be discharged to:: Skilled nursing facility Living Arrangements: Alone Available Help at Discharge: Family;Available PRN/intermittently Type of Home: House                 Bathroom Toilet: Standard     Home Equipment: Environmental consultant - 2 wheels;Cane - single point, bed rail          Prior Functioning/Environment Level of Independence: Independent        Comments: pt did not use AD on level ground but son and pt reported she has had several falls        OT Problem List: Decreased strength;Decreased range of motion;Impaired balance (sitting and/or standing);Pain;Impaired UE functional use;Decreased knowledge of precautions;Decreased coordination;Decreased knowledge of use of DME or AE      OT Treatment/Interventions: Self-care/ADL training;Balance training;Therapeutic activities;DME and/or AE instruction;Patient/family education    OT Goals(Current goals can be found in the care plan section) Acute Rehab OT Goals Patient Stated Goal: to be able to eat and find out about sx for arm OT Goal Formulation: With patient Time For Goal Achievement: 12/05/17 Potential to Achieve Goals: Good  OT Frequency: Min 2X/week   Barriers to D/C: Decreased caregiver support             AM-PAC PT "6 Clicks" Daily Activity     Outcome Measure Help from another person eating meals?: A Little Help from another person taking care of personal grooming?: A Lot Help from another person toileting, which includes using toliet, bedpan, or urinal?: A Lot Help from another person bathing (including washing, rinsing, drying)?: A Lot Help from another person to put on and taking off regular upper body clothing?: A Lot Help from another person to put on and taking off regular lower body clothing?: Total 6 Click Score: 12   End of Session    Activity Tolerance: Patient tolerated treatment well Patient left: in  bed;with call bell/phone within reach;with bed alarm set  OT Visit Diagnosis: Unsteadiness on feet (R26.81);Other abnormalities of gait and mobility (R26.89);Repeated falls (R29.6);Muscle weakness (generalized) (M62.81);History of falling (Z91.81);Pain Pain - Right/Left: Right Pain - part of body: Shoulder                Time: 9326-7124 OT Time Calculation (min): 20 min Charges:  OT General Charges $OT Visit: 1 Visit OT Evaluation $OT Eval Moderate Complexity: 1 Mod  [address], OTR/L Acute [facility] [phone] Office [phone]

## 2017-11-21 NOTE — Progress Notes (Signed)
PROGRESS NOTE    [name]  OXB:[id number] DOB: April 28, 1930 DOA: 11/20/2017 PCP: Burnard Bunting, MD  Brief Narrative:   HPI per Dr. Francia Greaves on 11/20/17 HPI: [name] is a 84 y.o. female with medical history significant for chronic atrial fibrillation on Coumadin, post cardiac pacemaker, hypothyroidism, chronic depression/anxiety who presented to ED [facility] from home after a fall at home earlier this morning.  Patient is independent of her ADLs.  Patient reports she tripped.  She denies any dizziness, chest pain, palpitations, dyspnea or any other symptoms prior to falling.  States the night before she had been moving boxes for a new handicap accessible home she is remodeling.  States she was in her usual state of health prior to this.  However she has been having recurrent mechanical falls within the last few months.  Denies hitting her head.  Fell on her right shoulder and reports severe pain.  ED Course: Accelerated hypertension on presentation most likely contributed by severe right shoulder pain.  Right shoulder x-ray revealed acute right mid to distal clavicle fracture with 2 cm overlapping fragments.  Orthopedic surgery, Dr. Onnie Graham, consulted by ED physician.  CT head no contrast unremarkable for any acute intracranial findings.  Admitted as observation status due to severe pain requiring IV pain medications.  **Patient still complaining of severe pain and debility. Has had recurrent falls and was complaining of wrist pain as well today.   Assessment & Plan:   Active Problems:   Presence of permanent cardiac pacemaker   Chronic a-fib (HCC)   Long term current use of anticoagulant therapy   Bradycardia   Clavicle fracture   Fall   Accelerated hypertension  Intractable Pain 2/2 Acute right clavicle fracture status post mechanical fall -Pain management in place with OxyContin 10 mg twice daily changed to Oxycodone 1-2 tab po q4hprn Moderate,Severe Pain -IV Dilaudid  1 mg every 4 hours as needed for severe pain -Continue with Robaxin 5 mg p.o. every 8 as needed for muscle spasms -Bowel regimen with Senokot 1 tablet twice daily and -MiraLAX 17 g daily -Dr. Onnie Graham of orthopedic surgery consulted by ED physician and I discussed the case with Orthopedics Dr. Doran Durand who recommends Shoulder immobilization and follow up with Ortho in the outpatient setting  -PT/OT Recommending SNF -Check Orthostatics  -Has a sling in place -Held Coumadin initially but because there will be no surgical intervention will resume home dose  Generalized Weakness/Debility and Ambulatory dysfunction status post mechanical fall -At baseline ambulates with a walker and a cane -CT Head w/o Contrast Brain atrophy and chronic white matter microvascular ischemic changes. No acute intracranial abnormality by noncontrast CT -Check TSH level  -CT of Chest: Showed Acute segmental right clavicle fracture as described above. No other acute abnormality.Marked cardiomegaly. Reflux of contrast into the inferior vena cava and hepatic veins is compatible with right heart insufficiency.Calcific coronary artery disease. Aortic Atherosclerosis  -CT of Neck showed Cervical degenerative spondylosis and facet arthropathy without acute osseous finding or fracture. Acute displaced right mid clavicle fracture -Check Orthostatics  -PT/OT Recommending SNF   Right Wrist Pain -Will get X-Ray   Chronic A. fib on Coumadin -Held Coumadin due to acute fracture for possible intervention but now resumed as Orthopedics wants to follow up as an outpatient  -INR is therapeutic on admission and repeat today was 2.15 -Troponin 0.02 -Continue remote telemetry on MedSurg -On Digoxin 0.125 mg p.o. daily  Hypothyroidism -Check TSH -Continue Levothyroxine  Accelerated Hypertension -Suspect contributed  by pain; BP now improved to 119/62 -Control pain -Resume antihypertensive medications with amlodipine 5 mg p.o.  daily -Continue with IV hydralazine 10 mg every 6 PRN for history of systolic blood pressure in 683 or diastolic blood pressure greater than 105 -Will increase amlodipine to 10 mg if persistent -Continue to monitor vital signs per protocol   Leukocytosis -Suspect reactive from fracture -WBC 12.0 on admission and repeat was 11.7 -No sign of active infective process -Afebrile -Negative UA -Chest x-ray showed Acute right mid to distal clavicle fracture with 2 cm of overlapping fragments. COPD and chronic interstitial changes. No active cardiopulmonary disease. -Repeat CBC in AM   CKD 3 -Baseline creatinine 0.9 with GFR 57 -Creatinine 1.11 on presentation and now CR is improved to 1.00 -Start gentle IV hydration normal saline at 50 cc/hr  -Monitor urine output -Repeat CMP in AM   S/P permanent cardiac pacemaker -CT showed Marked cardiomegaly. Reflux of contrast into the inferior vena cava and hepatic veins is compatible with right heart insufficiency.Calcific coronary artery disease. Aortic Atherosclerosis  -Pacemaker interrogation pending  -Follows with Dr. Wynonia Lawman outpatient  Asymptomatic Bradycardia -Has AICD and ventricular paced -Follows with cardiology outpatient, Dr. Wynonia Lawman -Consult cardiology if becomes problematic -Repeat ECHOCardiogram  Chronic Depression/Anxiety -Continue Sertraline 25 mg po Daily   DVT prophylaxis: Anticoagulated with Coumadin Code Status: FULL CODE Family Communication: No family present at bedside Disposition Plan: SNF vs Home Health PT  Consultants:  Discussed Case with Orthopedics Dr. Doran Durand  Procedures:  None  Antimicrobials:  Anti-infectives (From admission, onward)   None     Subjective: Seen and examined at bedside and states that she was still having significant pain and appears very weak.  Did not feel well.  Denied any chest pain but states her shoulder was "killing her."  Also complained of pain in the wrist to the nurse.   No shortness of breath.  No other concerns or complaints at this time and feels very weak.  Objective: Vitals:   11/20/17 1549 11/20/17 2127 11/21/17 0630 11/21/17 1517  BP: (!) 167/83 128/72 (!) 148/74 119/62  Pulse: 63 61 (!) 58 (!) 59  Resp: 17 14 14 20   Temp: 41.9 F (36.5 C) 97.7 F (36.5 C) 98.4 F (36.9 C) 98 F (36.7 C)  TempSrc: Oral Oral Oral Oral  SpO2: 94% 92% 94% 91%  Weight:   63.7 kg   Height: 5\' 1"  (1.549 m)       Intake/Output Summary (Last 24 hours) at 11/21/2017 1723 Last data filed at 11/21/2017 1500 Gross per 24 hour  Intake 267.49 ml  Output 400 ml  Net -132.51 ml   Filed Weights   11/20/17 0803 11/21/17 0630  Weight: 63.5 kg 63.7 kg   Examination: Physical Exam:  Constitutional: WN/WD Caucasian elderly female in some distress and appears extremely anxious and uncomfortable Eyes: Lids and conjunctivae normal, sclerae anicteric  ENMT: External Ears, Nose appear normal. Grossly normal hearing.  Neck: Appears normal, supple, no cervical masses, normal ROM, no appreciable thyromegaly, no JVD Respiratory: Diminished to auscultation bilaterally, no wheezing, rales, rhonchi or crackles. Normal respiratory effort and patient is not tachypenic. No accessory muscle use.  Cardiovascular: RRR but slightly bradycardic, Has a murmur. S1 and S2 auscultated.   Abdomen: Soft, non-tender, non-distended. No masses palpated. No appreciable hepatosplenomegaly. Bowel sounds positive x4.  GU: Deferred. Musculoskeletal: No clubbing / cyanosis of digits/nails. Right shoulder immobilized and has sling Skin: No rashes, lesions, ulcers. No induration; Warm and dry.  Neurologic: CN 2-12 grossly intact with no focal deficits.Romberg sign and cerebellar reflexes not assessed.  Psychiatric: Normal judgment and insight. Alert and oriented x 2. Anxious and tearful mood and appropriate affect.   Data Reviewed: I have personally reviewed following labs and imaging studies  CBC: Recent  Labs  Lab 11/20/17 0828 11/21/17 0501  WBC 12.0* 11.7*  NEUTROABS 10.2*  --   HGB 13.3 13.7  HCT 41.1 43.0  MCV 86.3 86.9  PLT 260 962   Basic Metabolic Panel: Recent Labs  Lab 11/20/17 0828 11/21/17 0501  NA 138 138  K 3.9 4.8  CL 101 99  CO2 26 29  GLUCOSE 156* 106*  BUN 35* 22  CREATININE 1.11* 1.00  CALCIUM 9.2 9.2  MG  --  2.0   GFR: Estimated Creatinine Clearance: 35.2 mL/min (by C-G formula based on SCr of 1 mg/dL). Liver Function Tests: Recent Labs  Lab 11/20/17 0828 11/21/17 0501  AST 26 21  ALT 21 22  ALKPHOS 77 69  BILITOT 1.0 1.0  PROT 7.3 7.1  ALBUMIN 4.0 3.8   No results for input(s): LIPASE, AMYLASE in the last 168 hours. No results for input(s): AMMONIA in the last 168 hours. Coagulation Profile: Recent Labs  Lab 11/20/17 0828 11/21/17 0501  INR 2.41 2.15   Cardiac Enzymes: No results for input(s): CKTOTAL, CKMB, CKMBINDEX, TROPONINI in the last 168 hours. BNP (last 3 results) No results for input(s): PROBNP in the last 8760 hours. HbA1C: Recent Labs    11/20/17 1544  HGBA1C 6.0*   CBG: No results for input(s): GLUCAP in the last 168 hours. Lipid Profile: No results for input(s): CHOL, HDL, LDLCALC, TRIG, CHOLHDL, LDLDIRECT in the last 72 hours. Thyroid Function Tests: No results for input(s): TSH, T4TOTAL, FREET4, T3FREE, THYROIDAB in the last 72 hours. Anemia Panel: No results for input(s): VITAMINB12, FOLATE, FERRITIN, TIBC, IRON, RETICCTPCT in the last 72 hours. Sepsis Labs: No results for input(s): PROCALCITON, LATICACIDVEN in the last 168 hours.  No results found for this or any previous visit (from the past 240 hour(s)).   Radiology Studies: Dg Wrist Complete Right  Result Date: 11/21/2017 CLINICAL DATA:  Wrist pain after fall EXAM: RIGHT WRIST - COMPLETE 3+ VIEW COMPARISON:  None. FINDINGS: No fracture or malalignment. Arthritis at the distal radial carpal interval. Cartilaginous calcifications. Moderate-to-marked  arthritis at the first [facility] joint. IMPRESSION: 1. No acute osseous abnormality 2. Moderate-to-marked arthritis first [facility] joint. 3. Chondrocalcinosis Electronically Signed   By: Donavan Foil M.D.   On: 11/21/2017 16:45   Ct Head Wo Contrast  Result Date: 11/20/2017 CLINICAL DATA:  Fall, head and neck injury, right clavicle fracture EXAM: CT HEAD WITHOUT CONTRAST CT CERVICAL SPINE WITHOUT CONTRAST TECHNIQUE: Multidetector CT imaging of the head and cervical spine was performed following the standard protocol without intravenous contrast. Multiplanar CT image reconstructions of the cervical spine were also generated. COMPARISON:  None. FINDINGS: CT HEAD FINDINGS Brain: Brain atrophy noted with chronic white matter microvascular ischemic changes throughout both cerebral hemispheres. No acute intracranial hemorrhage, mass lesion, midline shift, or shin, herniation, or extra-axial fluid collection. Normal gray-white matter differentiation. Cisterns are patent. Cerebellar atrophy as well. Vascular: Cranial atherosclerosis noted.  No hyperdense vessel. Skull: Normal. Negative for fracture or focal lesion. Sinuses/Orbits: No acute finding. Other: None. CT CERVICAL SPINE FINDINGS Alignment: Normal. Skull base and vertebrae: Intact cervical spine. No cervical spine fracture, subluxation, or dislocation. Multilevel degenerative spondylosis and facet arthropathy of the cervical spine. Soft tissues and  spinal canal: No prevertebral fluid or swelling. No visible canal hematoma. Disc levels: Multilevel degenerative spondylosis with disc space narrowing, sclerosis and endplate osteophytes. Degenerative changes of the C1-2 articulation. Preserved vertebral body heights. Upper chest: Acute minimally comminuted displaced right mid clavicle fracture noted with surrounding mild hematoma. Degenerative changes of the shoulders. Clear lung apices. Other: Carotid atherosclerosis noted. IMPRESSION: Brain atrophy and chronic white matter  microvascular ischemic changes. No acute intracranial abnormality by noncontrast CT. Cervical degenerative spondylosis and facet arthropathy without acute osseous finding or fracture Acute displaced right mid clavicle fracture Electronically Signed   By: Jerilynn Mages.  Shick M.D.   On: 11/20/2017 10:38   Ct Chest W Contrast  Result Date: 11/20/2017 CLINICAL DATA:  Status post fall this morning. Right clavicle pain. Initial encounter. EXAM: CT CHEST WITH CONTRAST TECHNIQUE: Multidetector CT imaging of the chest was performed during intravenous contrast administration. CONTRAST:  100 mL ISOVUE-370 IOPAMIDOL (ISOVUE-370) INJECTION 76% COMPARISON:  Plain film of the chest today. FINDINGS: Cardiovascular: There is marked cardiomegaly. Calcific aortic and coronary atherosclerosis is noted. No pericardial effusion. No aortic aneurysm. Mediastinum/Nodes: No enlarged mediastinal, hilar, or axillary lymph nodes. Thyroid gland, trachea, and esophagus demonstrate no significant findings. Lungs/Pleura: No pleural effusion. No pneumothorax. The lungs are clear. Upper Abdomen: Contrast refluxes into a dilated inferior vena cava in the patent veins consistent with right heart insufficiency. Musculoskeletal: The patient has an acute segmental fracture of the diaphysis of the right clavicle. The medial fracture line is angulated approximately 90 degrees with the segmental fracture fragment oriented almost directly posteriorly. More lateral fracture line is also oriented approximately 90 degrees. No other acute fracture is seen. There is exaggeration of the normal thoracic kyphosis. No lytic or sclerotic lesion is identified. IMPRESSION: Acute segmental right clavicle fracture as described above. No other acute abnormality. Marked cardiomegaly. Reflux of contrast into the inferior vena cava and hepatic veins is compatible with right heart insufficiency. Calcific coronary artery disease. Aortic Atherosclerosis (ICD10-I70.0). Electronically  Signed   By: Inge Rise M.D.   On: 11/20/2017 10:33   Ct Cervical Spine Wo Contrast  Result Date: 11/20/2017 CLINICAL DATA:  Fall, head and neck injury, right clavicle fracture EXAM: CT HEAD WITHOUT CONTRAST CT CERVICAL SPINE WITHOUT CONTRAST TECHNIQUE: Multidetector CT imaging of the head and cervical spine was performed following the standard protocol without intravenous contrast. Multiplanar CT image reconstructions of the cervical spine were also generated. COMPARISON:  None. FINDINGS: CT HEAD FINDINGS Brain: Brain atrophy noted with chronic white matter microvascular ischemic changes throughout both cerebral hemispheres. No acute intracranial hemorrhage, mass lesion, midline shift, or shin, herniation, or extra-axial fluid collection. Normal gray-white matter differentiation. Cisterns are patent. Cerebellar atrophy as well. Vascular: Cranial atherosclerosis noted.  No hyperdense vessel. Skull: Normal. Negative for fracture or focal lesion. Sinuses/Orbits: No acute finding. Other: None. CT CERVICAL SPINE FINDINGS Alignment: Normal. Skull base and vertebrae: Intact cervical spine. No cervical spine fracture, subluxation, or dislocation. Multilevel degenerative spondylosis and facet arthropathy of the cervical spine. Soft tissues and spinal canal: No prevertebral fluid or swelling. No visible canal hematoma. Disc levels: Multilevel degenerative spondylosis with disc space narrowing, sclerosis and endplate osteophytes. Degenerative changes of the C1-2 articulation. Preserved vertebral body heights. Upper chest: Acute minimally comminuted displaced right mid clavicle fracture noted with surrounding mild hematoma. Degenerative changes of the shoulders. Clear lung apices. Other: Carotid atherosclerosis noted. IMPRESSION: Brain atrophy and chronic white matter microvascular ischemic changes. No acute intracranial abnormality by noncontrast CT. Cervical degenerative spondylosis and  facet arthropathy without  acute osseous finding or fracture Acute displaced right mid clavicle fracture Electronically Signed   By: Jerilynn Mages.  Shick M.D.   On: 11/20/2017 10:38   Dg Pelvis Portable  Result Date: 11/20/2017 CLINICAL DATA:  Fall. EXAM: PORTABLE PELVIS 1-2 VIEWS COMPARISON:  CT abdomen pelvis dated September 19, 2017. FINDINGS: No acute fracture or dislocation. The pubic symphysis and sacroiliac joints are intact. Unchanged moderate right hip joint space narrowing with large subchondral cysts and large femoral head osteophytes. Unchanged mild left hip joint space narrowing. Diffuse osteopenia. Soft tissues are unremarkable. IMPRESSION: 1. No acute osseous abnormality. 2. 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 Electronically Signed   By: Titus Dubin M.D.   On: 11/20/2017 09:04   Dg Chest Portable 1 View  Result Date: 11/20/2017 CLINICAL DATA:  Fall.  Right clavicle pain. EXAM: PORTABLE CHEST 1 VIEW COMPARISON:  Chest x-ray dated November 10, 2017. FINDINGS: Unchanged single lead right chest wall pacemaker. Stable cardiomegaly. Atherosclerotic calcification of the aortic arch. Normal pulmonary vascularity. The lungs remain mildly hyperinflated. Chronic interstitial changes are similar to prior study. No focal consolidation, pleural effusion, or pneumothorax. Acute fracture of the right mid to distal clavicle with almost 2 cm of overlapping fragments. IMPRESSION: 1. Acute right mid to distal clavicle fracture with 2 cm of overlapping fragments. 2. COPD and chronic interstitial changes. No active cardiopulmonary disease. Electronically Signed   By: Titus Dubin M.D.   On: 11/20/2017 09:01   Scheduled Meds: . amLODipine  5 mg Oral Daily  . cholecalciferol  1,000 Units Oral Daily  . digoxin  0.125 mg Oral BH-q7a  . levothyroxine  125 mcg Oral QAC breakfast  . polyethylene glycol  17 g Oral Daily  . senna-docusate  1 tablet Oral BID  . sertraline  25 mg Oral Daily  . vitamin B-12  1,000 mcg Oral Daily  . warfarin  4  mg Oral ONCE-1800  . Warfarin - Pharmacist Dosing Inpatient   Does not apply q1800   Continuous Infusions: . sodium chloride 50 mL/hr at 11/21/17 1204    LOS: 0 days   Kerney Elbe, DO [facility] PAGER is on [place]  If 7PM-7AM, please contact night-coverage [email or web address] Password [facility] 11/21/2017, 5:23 PM

## 2017-11-21 NOTE — Progress Notes (Signed)
ANTICOAGULATION CONSULT NOTE - Initial Consult  Pharmacy Consult for warfarin Indication: atrial fibrillation  Allergies  Allergen Reactions  . Penicillins Hives and Rash    Has patient had a PCN reaction causing immediate rash, facial/tongue/throat swelling, SOB or lightheadedness with hypotension: unkn Has patient had a PCN reaction causing severe rash involving mucus membranes or skin necrosis: unkn Has patient had a PCN reaction that required hospitalization: unkn Has patient had a PCN reaction occurring within the last 10 years: unkn If all of the above answers are "NO", then may proceed with Cephalosporin use.     Patient Measurements: Height: 5\' 1"  (154.9 cm) Weight: 140 lb 6.9 oz (63.7 kg) IBW/kg (Calculated) : 47.8  Vital Signs: Temp: 98.4 F (36.9 C) (09/02 0630) Temp Source: Oral (09/02 0630) BP: 148/74 (09/02 0630) Pulse Rate: 58 (09/02 0630)  Labs: Recent Labs    11/20/17 0828 11/21/17 0501  HGB 13.3 13.7  HCT 41.1 43.0  PLT 260 257  LABPROT 26.0* 23.9*  INR 2.41 2.15  CREATININE 1.11* 1.00    Estimated Creatinine Clearance: 35.2 mL/min (by C-G formula based on SCr of 1 mg/dL).   Assessment: 74 yof on chronic warfarin for history of afib. Warfarin initially held due to clavicle fracture but now resuming. INR is therapeutic at 2.15. Due to missed dose yesterday, INR may decrease further. CBC is WNL and no bleeding noted.   Goal of Therapy:  INR 2-3 Monitor platelets by anticoagulation protocol: Yes   Plan:  Warfarin 4mg  PO x 1 tonight Daily INR  Srihith Aquilino, Rande Lawman 11/21/2017,1:31 PM

## 2017-11-21 NOTE — Evaluation (Signed)
Physical Therapy Evaluation Patient Details Name: [name] MRN: [id number] DOB: 11-27-1930 Today's Date: 11/21/2017   History of Present Illness  [name] is a 84 y.o. female with medical history significant for chronic atrial fibrillation on Coumadin, post cardiac pacemaker, hypothyroidism, chronic depression/anxiety who presented to ED [facility] from home after a fall at home earlier this morning. Fell on R shoulder with R clavicular fx.   Clinical Impression  Pt admitted with above diagnosis. Pt currently with functional limitations due to the deficits listed below (see PT Problem List). Pt mobility very limited by pain, needs mod A to stand and ambulate 12'. Very unsteady, unsafe to ambulate without assistance. Pt nauseous and feels that it is related to taking pain meds on empty stomach as she is currently NPO for potential surgery but no surgeon has consulted yet, RN made aware. Pt not safe to be home alone.   Pt will benefit from skilled PT to increase their independence and safety with mobility to allow discharge to the venue listed below.       Follow Up Recommendations SNF;Supervision/Assistance - 24 hour    Equipment Recommendations  None recommended by PT    Recommendations for Other Services OT consult     Precautions / Restrictions Precautions Precautions: Fall Precaution Comments: pt has fallen several times recently with other injuries Required Braces or Orthoses: Sling Restrictions Weight Bearing Restrictions: Yes RUE Weight Bearing: Non weight bearing Other Position/Activity Restrictions: no WB'ing orders specified in notes but kept pt NWB for pain control and while waiting for decision on sx      Mobility  Bed Mobility Overal bed mobility: Needs Assistance Bed Mobility: Sit to Supine       Sit to supine: Mod assist   General bed mobility comments: mod A for LE's into bed and positioning  Transfers Overall transfer level: Needs  assistance Equipment used: 1 person hand held assist Transfers: Sit to/from Stand Sit to Stand: Mod assist         General transfer comment: mod A for power up and to gain balance  Ambulation/Gait Ambulation/Gait assistance: Mod assist Gait Distance (Feet): 12 Feet Assistive device: 1 person hand held assist Gait Pattern/deviations: Shuffle Gait velocity: decreased Gait velocity interpretation: <1.31 ft/sec, indicative of household ambulator General Gait Details: shuffling, unsteady gait. Closing eyes due to pain  Stairs            Wheelchair Mobility    Modified Rankin (Stroke Patients Only)       Balance Overall balance assessment: Needs assistance;History of Falls Sitting-balance support: Single extremity supported Sitting balance-Leahy Scale: Fair     Standing balance support: Single extremity supported Standing balance-Leahy Scale: Poor Standing balance comment: very unsteady in standing                             Pertinent Vitals/Pain Pain Assessment: 0-10 Pain Score: 10-Worst pain ever Pain Location: R clavicle Pain Descriptors / Indicators: Grimacing;Aching Pain Intervention(s): Limited activity within patient's tolerance;Monitored during session;Patient requesting pain meds-RN notified    Home Living Family/patient expects to be discharged to:: Private residence Living Arrangements: Alone Available Help at Discharge: Family;Available PRN/intermittently Type of Home: House         Home Equipment: Walker - 2 wheels;Cane - single point      Prior Function Level of Independence: Independent         Comments: pt did not use AD on level  ground but son reports she has had several falls     Hand Dominance   Dominant Hand: Right    Extremity/Trunk Assessment   Upper Extremity Assessment Upper Extremity Assessment: Defer to OT evaluation    Lower Extremity Assessment Lower Extremity Assessment: Generalized weakness     Cervical / Trunk Assessment Cervical / Trunk Assessment: Kyphotic  Communication   Communication: No difficulties  Cognition Arousal/Alertness: Awake/alert Behavior During Therapy: WFL for tasks assessed/performed Overall Cognitive Status: Within Functional Limits for tasks assessed                                        General Comments General comments (skin integrity, edema, etc.): pt very frustrated because she did not received breakfast due to NPO status yet surgeon has not seen her yet. C/o nausea and hunger    Exercises     Assessment/Plan    PT Assessment Patient needs continued PT services  PT Problem List Decreased strength;Decreased range of motion;Decreased activity tolerance;Decreased balance;Decreased mobility;Decreased knowledge of use of DME;Decreased knowledge of precautions;Pain       PT Treatment Interventions DME instruction;Gait training;Functional mobility training;Therapeutic activities;Therapeutic exercise;Balance training;Neuromuscular re-education;Patient/family education    PT Goals (Current goals can be found in the Care Plan section)  Acute Rehab PT Goals Patient Stated Goal: have surgery PT Goal Formulation: With patient Time For Goal Achievement: 12/05/17 Potential to Achieve Goals: Fair    Frequency Min 3X/week   Barriers to discharge Decreased caregiver support lives alone    Co-evaluation               AM-PAC PT "6 Clicks" Daily Activity  Outcome Measure Difficulty turning over in bed (including adjusting bedclothes, sheets and blankets)?: Unable Difficulty moving from lying on back to sitting on the side of the bed? : Unable Difficulty sitting down on and standing up from a chair with arms (e.g., wheelchair, bedside commode, etc,.)?: Unable Help needed moving to and from a bed to chair (including a wheelchair)?: A Lot Help needed walking in hospital room?: A Lot Help needed climbing 3-5 steps with a railing? :  Total 6 Click Score: 8    End of Session Equipment Utilized During Treatment: Gait belt(sling) Activity Tolerance: Patient limited by pain Patient left: in bed;with call bell/phone within reach;with bed alarm set;with family/visitor present;with nursing/sitter in room Nurse Communication: Mobility status PT Visit Diagnosis: Unsteadiness on feet (R26.81);Repeated falls (R29.6);Difficulty in walking, not elsewhere classified (R26.2);Pain Pain - Right/Left: Right Pain - part of body: Shoulder    Time: 1287-8676 PT Time Calculation (min) (ACUTE ONLY): 23 min   Charges:   PT Evaluation $PT Eval Moderate Complexity: 1 Mod PT Treatments $Gait Training: 8-22 mins        Leighton Roach, PT  Acute Rehab Services  [place] 11/21/2017, 1:15 PM

## 2017-11-22 ENCOUNTER — Inpatient Hospital Stay (HOSPITAL_COMMUNITY): Payer: Medicare Other

## 2017-11-22 LAB — ECHOCARDIOGRAM COMPLETE
Height: 61 in
Weight: 2246.93 oz

## 2017-11-22 LAB — COMPREHENSIVE METABOLIC PANEL
ALT: 16 U/L (ref 0–44)
AST: 16 U/L (ref 15–41)
Albumin: 3.3 g/dL — ABNORMAL LOW (ref 3.5–5.0)
Alkaline Phosphatase: 62 U/L (ref 38–126)
Anion gap: 6 (ref 5–15)
BILIRUBIN TOTAL: 1.2 mg/dL (ref 0.3–1.2)
BUN: 23 mg/dL (ref 8–23)
CALCIUM: 8.5 mg/dL — AB (ref 8.9–10.3)
CHLORIDE: 99 mmol/L (ref 98–111)
CO2: 28 mmol/L (ref 22–32)
Creatinine, Ser: 0.92 mg/dL (ref 0.44–1.00)
GFR, EST NON AFRICAN AMERICAN: 55 mL/min — AB (ref >60)
Glucose, Bld: 119 mg/dL — ABNORMAL HIGH (ref 70–99)
Potassium: 3.9 mmol/L (ref 3.5–5.1)
Sodium: 133 mmol/L — ABNORMAL LOW (ref 135–145)
TOTAL PROTEIN: 6.4 g/dL — AB (ref 6.5–8.1)

## 2017-11-22 LAB — CBC WITH DIFFERENTIAL/PLATELET
ABS IMMATURE GRANULOCYTES: 0.1 10*3/uL (ref 0.0–0.1)
BASOS ABS: 0 10*3/uL (ref 0.0–0.1)
BASOS PCT: 0 %
EOS ABS: 0 10*3/uL (ref 0.0–0.7)
EOS PCT: 0 %
HCT: 41.5 % (ref 36.0–46.0)
Hemoglobin: 13.5 g/dL (ref 12.0–15.0)
Immature Granulocytes: 1 %
Lymphocytes Relative: 7 %
Lymphs Abs: 0.9 10*3/uL (ref 0.7–4.0)
MCH: 28 pg (ref 26.0–34.0)
MCHC: 32.5 g/dL (ref 30.0–36.0)
MCV: 86.1 fL (ref 78.0–100.0)
MONO ABS: 2.2 10*3/uL — AB (ref 0.1–1.0)
MONOS PCT: 16 %
Neutro Abs: 10.3 10*3/uL — ABNORMAL HIGH (ref 1.7–7.7)
Neutrophils Relative %: 76 %
Platelets: 226 10*3/uL (ref 150–400)
RBC: 4.82 MIL/uL (ref 3.87–5.11)
RDW: 14.6 % (ref 11.5–15.5)
WBC: 13.6 10*3/uL — ABNORMAL HIGH (ref 4.0–10.5)

## 2017-11-22 LAB — T4, FREE: Free T4: 1.05 ng/dL (ref 0.82–1.77)

## 2017-11-22 LAB — PHOSPHORUS: PHOSPHORUS: 3.1 mg/dL (ref 2.5–4.6)

## 2017-11-22 LAB — PROTIME-INR
INR: 2.53
PROTHROMBIN TIME: 27 s — AB (ref 11.4–15.2)

## 2017-11-22 LAB — TSH: TSH: 3.084 u[IU]/mL (ref 0.350–4.500)

## 2017-11-22 LAB — MAGNESIUM: MAGNESIUM: 1.8 mg/dL (ref 1.7–2.4)

## 2017-11-22 MED ORDER — WARFARIN SODIUM 3 MG PO TABS
3.0000 mg | ORAL_TABLET | Freq: Once | ORAL | Status: AC
  Administered 2017-11-22: 3 mg via ORAL
  Filled 2017-11-22: qty 1

## 2017-11-22 MED ORDER — SODIUM CHLORIDE 0.9 % IV SOLN: INTRAVENOUS | Status: DC

## 2017-11-22 MED ORDER — SODIUM CHLORIDE 0.9 % IV SOLN
INTRAVENOUS | Status: AC
  Administered 2017-11-22 – 2017-11-23 (×2): via INTRAVENOUS

## 2017-11-22 NOTE — Progress Notes (Signed)
ANTICOAGULATION CONSULT NOTE  Pharmacy Consult:  Coumadin Indication: atrial fibrillation  Allergies  Allergen Reactions  . Penicillins Hives and Rash    Has patient had a PCN reaction causing immediate rash, facial/tongue/throat swelling, SOB or lightheadedness with hypotension: unkn Has patient had a PCN reaction causing severe rash involving mucus membranes or skin necrosis: unkn Has patient had a PCN reaction that required hospitalization: unkn Has patient had a PCN reaction occurring within the last 10 years: unkn If all of the above answers are "NO", then may proceed with Cephalosporin use.     Patient Measurements: Height: 5\' 1"  (154.9 cm) Weight: 140 lb 6.9 oz (63.7 kg) IBW/kg (Calculated) : 47.8  Vital Signs: Temp: 98.6 F (37 C) (09/03 1025) Temp Source: Oral (09/03 1025) BP: 150/71 (09/03 1025) Pulse Rate: 65 (09/03 1025)  Labs: Recent Labs    11/20/17 0828 11/21/17 0501 11/21/17 1733 11/22/17 0430 11/22/17 0839  HGB 13.3 13.7 12.7  --  13.5  HCT 41.1 43.0 39.5  --  41.5  PLT 260 257 231  --  226  LABPROT 26.0* 23.9*  --  27.0*  --   INR 2.41 2.15  --  2.53  --   CREATININE 1.11* 1.00  --  0.92  --     Estimated Creatinine Clearance: 38.3 mL/min (by C-G formula based on SCr of 0.92 mg/dL).   Assessment: 87 YOF on Coumadin PTA for history of Afib. Coumadin initially held due to clavicle fracture but resumed on 11/21/17.  Surprisingly INR increased today (but remains therapeutic) despite missing a dose on 11/20/17.  No interacting meds; no PO intake charted.  No bleeding reported.   Goal of Therapy:  INR 2-3 Monitor platelets by anticoagulation protocol: Yes    Plan:  Coumadin 3mg  PO today Daily PT / INR   Tristy Udovich D. Mina Marble, PharmD, BCPS, [place] 11/22/2017, 1:08 PM

## 2017-11-22 NOTE — Progress Notes (Signed)
OT Cancellation Note  Patient Details Name: [name] MRN: [id number] DOB: Jun 25, 1930   Cancelled Treatment:    Reason Eval/Treat Not Completed: Patient at procedure or test/ unavailable. Pt currently out of room for ECHO. [place] Circle, OTR/L Acute Rehab Services Pager [phone] Office [phone]    11/22/2017, 9:35 AM

## 2017-11-22 NOTE — NC FL2 (Signed)
[place] LEVEL OF CARE SCREENING TOOL     IDENTIFICATION  Patient Name: [name] Birthdate: 04-Mar-1931 Sex: female Admission Date (Current Location): 11/20/2017  [facility] and [place] Number:  Herbalist and Address:  [facility] [place] [place] [address] [place], [place] [address]      Provider Number: [id number]  Attending Physician Name and Address:  Kerney Elbe, DO  Relative Name and Phone Number:       Current Level of Care: Hospital Recommended Level of Care: [place] Prior Approval Number:    Date Approved/Denied:   PASRR Number: [id number]  Discharge Plan: SNF    Current Diagnoses: Patient Active Problem List   Diagnosis Date Noted  . Clavicle fracture 11/20/2017  . Fall   . Accelerated hypertension   . Bradycardia 02/02/2017  . Presence of permanent cardiac pacemaker 01/28/2017  . Chronic a-fib ([place]) 01/28/2017  . Long term current use of anticoagulant therapy 01/28/2017  . Chronic venous insufficiency 01/28/2017  . DOE (dyspnea on exertion) 09/22/2015    Orientation RESPIRATION BLADDER Height & Weight     Time, Situation, Place, Self  O2(Nasal Canula 3L) Incontinent, External catheter Weight: 140 lb 6.9 oz (63.7 kg) Height:  5\' 1"  (154.9 cm)  BEHAVIORAL SYMPTOMS/MOOD NEUROLOGICAL BOWEL NUTRITION STATUS        Diet(Please see discharge summary)  AMBULATORY STATUS COMMUNICATION OF NEEDS Skin   Extensive Assist(moderate assist) Verbally Normal                       Personal Care Assistance Level of Assistance  Bathing, Feeding, Dressing Bathing Assistance: Maximum assistance Feeding assistance: Independent Dressing Assistance: Maximum assistance     Functional Limitations Info             SPECIAL CARE FACTORS FREQUENCY  OT (By licensed OT), PT (By licensed PT)     PT Frequency: 5x per week OT Frequency: 5x per week            Contractures Contractures  Info: Not present    Additional Factors Info  Psychotropic, Code Status, Allergies Code Status Info: Full Allergies Info: Penicillins Psychotropic Info: Zoloft         Current Medications (11/22/2017):  This is the current hospital active medication list Current Facility-Administered Medications  Medication Dose Route Frequency Provider Last Rate Last Dose  . 0.9 %  sodium chloride infusion   Intravenous Continuous Irene Pap N, DO 50 mL/hr at 11/22/17 0600    . amLODipine (NORVASC) tablet 5 mg  5 mg Oral Daily Arnett, Archie Patten N, DO   5 mg at 11/21/17 0957  . cholecalciferol (VITAMIN D) tablet 1,000 Units  1,000 Units Oral Daily Irene Pap N, DO   1,000 Units at 11/21/17 0957  . digoxin (LANOXIN) tablet 0.125 mg  0.125 mg Oral [address], Willey N, DO   0.125 mg at 11/22/17 0645  . fluticasone (FLONASE) 50 MCG/ACT nasal spray 1 spray  1 spray Each Nare Daily PRN Irene Pap N, DO      . hydrALAZINE (APRESOLINE) injection 10 mg  10 mg Intravenous Q6H PRN Irene Pap N, DO   10 mg at 11/22/17 0538  . HYDROmorphone (DILAUDID) injection 0.5 mg  0.5 mg Intravenous Q3H PRN Irene Pap N, DO   0.5 mg at 11/21/17 1003  . levothyroxine (SYNTHROID, LEVOTHROID) tablet 125 mcg  125 mcg Oral QAC breakfast Kayleen Memos, DO   125  mcg at 11/21/17 0733  . meclizine (ANTIVERT) tablet 25 mg  25 mg Oral Daily PRN Irene Pap N, DO      . methocarbamol (ROBAXIN) tablet 500 mg  500 mg Oral Q8H PRN Raiford Noble [place], DO   500 mg at 11/21/17 1202  . ondansetron (ZOFRAN) injection 4 mg  4 mg Intravenous Q6H PRN Irene Pap N, DO      . oxyCODONE-acetaminophen (PERCOCET/ROXICET) 5-325 MG per tablet 1-2 tablet  1-2 tablet Oral Q4H PRN Raiford Noble [place], DO   1 tablet at 11/22/17 0645  . polyethylene glycol (MIRALAX / GLYCOLAX) packet 17 g  17 g Oral Daily Hall, Carole N, DO      . senna-docusate (Senokot-S) tablet 1 tablet  1 tablet Oral BID Kayleen Memos, DO   1 tablet at 11/21/17 2221  . sertraline  (ZOLOFT) tablet 25 mg  25 mg Oral Daily Irene Pap N, DO   25 mg at 11/21/17 0957  . vitamin B-12 (CYANOCOBALAMIN) tablet 1,000 mcg  1,000 mcg Oral Daily Irene Pap N, DO   1,000 mcg at 11/21/17 0957  . Warfarin - Pharmacist Dosing Inpatient   Does not apply q1800 [place], Valeda Malm [facility]         Discharge Medications: Please see discharge summary for a list of discharge medications.  Relevant Imaging Results:  Relevant Lab Results:   Additional Information SSN: [id number]  Estanislado Emms, LCSW

## 2017-11-22 NOTE — Progress Notes (Signed)
  Echocardiogram 2D Echocardiogram has been performed.  Jennette Dubin 11/22/2017, 10:30 AM

## 2017-11-22 NOTE — Progress Notes (Signed)
Physical Therapy Treatment Patient Details Name: [name] MRN: [id number] DOB: 1930/04/15 Today's Date: 11/22/2017    History of Present Illness [name] is a 84 y.o. female with medical history significant for chronic atrial fibrillation on Coumadin, post cardiac pacemaker, hypothyroidism, chronic depression/anxiety who presented to ED [facility] from home after a fall at home earlier this morning. Fell on R shoulder with R clavicular fx.     PT Comments    Pt progressing with mobility but still having difficulty with pain management. She also reports symptoms including photosensitivity, intermittent dizziness, and "fogginess". She is keeping her eyes closed most of the time due to photosensitivity. Needs mod A to get to EOB and stand, min A for ambulation with cane with +2 for safety and equipment. She had one LOB to R with min A to correct. Continue to recommend rehab due to extremely high fall risk. PT will continue to follow.      Follow Up Recommendations  SNF;Supervision/Assistance - 24 hour     Equipment Recommendations  None recommended by PT    Recommendations for Other Services OT consult     Precautions / Restrictions Precautions Precautions: Fall Precaution Comments: pt has fallen several times recently with other injuries Required Braces or Orthoses: Sling Restrictions Weight Bearing Restrictions: Yes RUE Weight Bearing: Non weight bearing Other Position/Activity Restrictions: no WB'ing orders specified in notes but kept pt NWB for pain control and while waiting for decision on sx    Mobility  Bed Mobility Overal bed mobility: Needs Assistance Bed Mobility: Supine to Sit     Supine to sit: Mod assist     General bed mobility comments: min A for LE's off bed but mod A for trunk elevation away from bed  Transfers Overall transfer level: Needs assistance Equipment used: 1 person hand held assist Transfers: Sit to/from Stand Sit to Stand: Mod  assist         General transfer comment: mod A for power up and to gain balance  Ambulation/Gait Ambulation/Gait assistance: Mod assist;+2 safety/equipment Gait Distance (Feet): 20 Feet Assistive device: 1 person hand held assist;Straight cane Gait Pattern/deviations: Shuffle Gait velocity: decreased Gait velocity interpretation: <1.31 ft/sec, indicative of household ambulator General Gait Details: continues to keep eyes closed a lot due to photosensitivity. Worked on use of SPC in L hand and it did give her a bit extra stability but she needed frequent vc's to position it properly on floor to help. 1 LOB to R with min A to correct and decreased balance noted with bkwd stepping.    Stairs             Wheelchair Mobility    Modified Rankin (Stroke Patients Only)       Balance Overall balance assessment: Needs assistance Sitting-balance support: No upper extremity supported;Feet supported Sitting balance-Leahy Scale: Fair     Standing balance support: Single extremity supported Standing balance-Leahy Scale: Poor Standing balance comment: very unsteady in standing without A                            Cognition Arousal/Alertness: Awake/alert Behavior During Therapy: WFL for tasks assessed/performed Overall Cognitive Status: Within Functional Limits for tasks assessed                                 General Comments: pt reports "fogginess" with pain meds  Exercises      General Comments General comments (skin integrity, edema, etc.): discussed need for rehab for safety      Pertinent Vitals/Pain Pain Assessment: 0-10 Pain Score: 10-Worst pain ever Pain Location: R clavicle Pain Descriptors / Indicators: Sharp;Aching;Guarding Pain Intervention(s): Limited activity within patient's tolerance;Monitored during session;Patient requesting pain meds-RN notified;RN gave pain meds during session    Home Living                       Prior Function            PT Goals (current goals can now be found in the care plan section) Acute Rehab PT Goals Patient Stated Goal: return home PT Goal Formulation: With patient Time For Goal Achievement: 12/05/17 Potential to Achieve Goals: Fair Progress towards PT goals: Progressing toward goals    Frequency    Min 3X/week      PT Plan Current plan remains appropriate    Co-evaluation              AM-PAC PT "6 Clicks" Daily Activity  Outcome Measure  Difficulty turning over in bed (including adjusting bedclothes, sheets and blankets)?: Unable Difficulty moving from lying on back to sitting on the side of the bed? : Unable Difficulty sitting down on and standing up from a chair with arms (e.g., wheelchair, bedside commode, etc,.)?: Unable Help needed moving to and from a bed to chair (including a wheelchair)?: A Lot Help needed walking in hospital room?: A Lot Help needed climbing 3-5 steps with a railing? : Total 6 Click Score: 8    End of Session Equipment Utilized During Treatment: Gait belt(sling) Activity Tolerance: Patient tolerated treatment well Patient left: with call bell/phone within reach;with family/visitor present;in chair Nurse Communication: Mobility status PT Visit Diagnosis: Unsteadiness on feet (R26.81);Repeated falls (R29.6);Difficulty in walking, not elsewhere classified (R26.2);Pain Pain - Right/Left: Right Pain - part of body: Shoulder     Time: 7902-4097 PT Time Calculation (min) (ACUTE ONLY): 28 min  Charges:  $Gait Training: 8-22 mins $Therapeutic Activity: 8-22 mins                     Leighton Roach, PT  Acute Rehab Services  [place] 11/22/2017, 12:14 PM

## 2017-11-22 NOTE — Progress Notes (Signed)
PROGRESS NOTE    [name]  ZOX:[id number] DOB: [birth year] DOA: 11/20/2017 PCP: Burnard Bunting, MD  Brief Narrative:   HPI per Dr. Francia Greaves on 11/20/17 HPI: [name] is a 84 y.o. female with medical history significant for chronic atrial fibrillation on Coumadin, post cardiac pacemaker, hypothyroidism, chronic depression/anxiety who presented to ED [facility] from home after a fall at home earlier this morning.  Patient is independent of her ADLs.  Patient reports she tripped.  She denies any dizziness, chest pain, palpitations, dyspnea or any other symptoms prior to falling.  States the night before she had been moving boxes for a new handicap accessible home she is remodeling.  States she was in her usual state of health prior to this.  However she has been having recurrent mechanical falls within the last few months.  Denies hitting her head.  Fell on her right shoulder and reports severe pain.  ED Course: Accelerated hypertension on presentation most likely contributed by severe right shoulder pain.  Right shoulder x-ray revealed acute right mid to distal clavicle fracture with 2 cm overlapping fragments.  Orthopedic surgery, Dr. Onnie Graham, consulted by ED physician.  CT head no contrast unremarkable for any acute intracranial findings.  Admitted as observation status due to severe pain requiring IV pain medications.  **Patient still complaining of severe pain and debility. PT/OT continues to recommend SNF. Updated Daughter at bedside and informed that SNF may not be an option if she does not meet 3 midnight qualifying stay.   Assessment & Plan:   Active Problems:   Presence of permanent cardiac pacemaker   Chronic a-fib (HCC)   Long term current use of anticoagulant therapy   Bradycardia   Clavicle fracture   Fall   Accelerated hypertension  Intractable Pain 2/2 Acute right clavicle fracture status post mechanical fall -Pain management in place with OxyContin 10 mg  twice daily changed to Oxycodone 1-2 tab po q4hprn Moderate,Severe Pain as she continues to still have pain  -IV Dilaudid 1 mg every 4 hours as needed for severe pain -Continue with Robaxin 5 mg p.o. every 8 as needed for muscle spasms -Bowel regimen with Senokot 1 tablet twice daily and -MiraLAX 17 g daily -Dr. Onnie Graham of orthopedic surgery consulted by ED physician and I discussed the case with Orthopedics Dr. Doran Durand who recommends Shoulder immobilization and follow up with Ortho in the outpatient setting  -PT/OT Recommending SNF -Check Orthostatics  -Has a sling in place -Held Coumadin initially but because there will be no surgical intervention will resume home dose  Generalized Weakness/Debility and Ambulatory dysfunction status post mechanical fall -At baseline ambulates with a walker and a cane -CT Head w/o Contrast Brain atrophy and chronic white matter microvascular ischemic changes. No acute intracranial abnormality by noncontrast CT -Check TSH level  -CT of Chest: Showed Acute segmental right clavicle fracture as described above. No other acute abnormality.Marked cardiomegaly. Reflux of contrast into the inferior vena cava and hepatic veins is compatible with right heart insufficiency.Calcific coronary artery disease. Aortic Atherosclerosis  -CT of Neck showed Cervical degenerative spondylosis and facet arthropathy without acute osseous finding or fracture. Acute displaced right mid clavicle fracture -Check Orthostatics and pending  -PT/OT Recommending SNF   Right Wrist Pain -X-Ray showed No acute osseous abnormality. Moderate-to-marked arthritis first CMC joint. Chondrocalcinosis -C/w Pain Control as above   Chronic A. fib on Coumadin -Held Coumadin due to acute fracture for possible intervention but now resumed as Orthopedics wants to follow up  as an outpatient  -INR is therapeutic on admission and repeat today was 2.53 -Troponin 0.02 -Continue remote telemetry on  MedSurg -On Digoxin 0.125 mg p.o. daily  Hypothyroidism -Checked TSH was 3.084 and Free T4 was 1.05 -Continue Levothyroxine  Accelerated Hypertension -Suspect contributed by pain; BP now 98/56 -Control pain -Resume antihypertensive medications with amlodipine 5 mg p.o. daily -Continue with IV hydralazine 10 mg every 6 PRN for history of systolic blood pressure in 409 or diastolic blood pressure greater than 105 -Will increase amlodipine to 10 mg if persistent -Continue to monitor vital signs per protocol   Leukocytosis -Suspect reactive from fracture and from pain  -WBC 12.0 on admission and repeat was 11.7 yesterday and worsened to 13.6 -No sign of active infective process -Afebrile -Negative UA -Chest x-ray showed Acute right mid to distal clavicle fracture with 2 cm of overlapping fragments. COPD and chronic interstitial changes. No active cardiopulmonary disease. -Repeat CBC in AM   CKD 3 -Baseline creatinine 0.9 with GFR 57 -Creatinine 1.11 on presentation and now CR is improved to 0.92 -Start gentle IV hydration normal saline at 50 cc/hr but increased to 75 mL/hr given hyponatremia  -Monitor urine output -Repeat CMP in AM   S/P permanent cardiac pacemaker -CT showed Marked cardiomegaly. Reflux of contrast into the inferior vena cava and hepatic veins is compatible with right heart insufficiency.Calcific coronary artery disease. Aortic Atherosclerosis  -Pacemaker interrogation pending  -ECHO as below  -Follows with Dr. Wynonia Lawman outpatient  Asymptomatic Bradycardia -Has AICD and ventricular paced -Follows with cardiology outpatient, Dr. Wynonia Lawman -Consult cardiology if becomes problematic -Repeat ECHOCardiogram showed EF of 60-65% with normal wall motion and there was no regional wall motion abnormalities. There was mild to moderate Aortic Stenosis and severe Dilation of Right and Left Atrium   Hyponatremia -Na+ dropped and went to 133 -Increased IVF rate to 75 mL/hr   -Repeat CMP in AM   Chronic Depression/Anxiety -Continue Sertraline 25 mg po Daily   DVT prophylaxis: Anticoagulated with Coumadin Code Status: FULL CODE Family Communication: Discussed with Daughter at bedside  Disposition Plan: SNF recommended by PT/OT but may need to go home if does not meet qualifying 3 midnight stay  Consultants:  Discussed Case with Orthopedics Dr. Doran Durand  Procedures:  ECHOCARDIOGRAM Study Conclusions  - Left ventricle: The cavity size was normal. Systolic function was   normal. The estimated ejection fraction was in the range of 60%   to 65%. Wall motion was normal; there were no regional wall   motion abnormalities. - Aortic valve: Cusp separation was mildly reduced. Transvalvular   velocity was increased. There was mild to moderate stenosis.   Valve area (VTI): 0.92 cm^2. Valve area (Vmax): 0.86 cm^2. Valve   area (Vmean): 1.03 cm^2. - Mitral valve: Mildly to moderately calcified annulus. Mildly   calcified leaflets . There was mild to moderate regurgitation. - Left atrium: The atrium was severely dilated. - Right atrium: The atrium was severely dilated. - Tricuspid valve: There was moderate regurgitation. - Pulmonary arteries: Systolic pressure was moderately increased.   PA peak pressure: 53 mm Hg (S).  Antimicrobials:  Anti-infectives (From admission, onward)   None     Subjective: Seen and examined at bedside and states that she was still having significant pain and did not rest well.  Chest pain or shortness breath.  No lightheadedness or dizziness.  States that she fell with an outstretched right arm.  No other concerns or complaints and wrist is feeling a little bit  better however shoulder is hurting significantly.  Objective: Vitals:   11/22/17 0530 11/22/17 0617 11/22/17 1025 11/22/17 1349  BP: (!) 190/81 (!) 144/74 (!) 150/71 (!) 98/56  Pulse: (!) 57 75 65 64  Resp: 18  16 16   Temp: 97.9 F (36.6 C)  98.6 F (37 C) 98.4 F  (36.9 C)  TempSrc: Oral  Oral Oral  SpO2: 97%  97% 94%  Weight:      Height:        Intake/Output Summary (Last 24 hours) at 11/22/2017 1709 Last data filed at 11/22/2017 0600 Gross per 24 hour  Intake 600 ml  Output 600 ml  Net 0 ml   Filed Weights   11/20/17 0803 11/21/17 0630  Weight: 63.5 kg 63.7 kg   Examination: Physical Exam:  Constitutional: Well-nourished, well-developed elderly female who is currently in no acute distress but appears uncomfortable and still very anxious. Eyes: Sclera anicteric.  Lids and conjunctive are normal. ENMT: External ears and nose appear normal.  Grossly normal hearing Neck: Appears supple with no JVD Respiratory: Diminished to auscultation bilaterally no appreciable wheezing, rales, rhonchi.  Patient not tachypneic using accessory muscle breathe Cardiovascular: Regular rate and rhythm and slightly on the slower side.  Has a systolic murmur.  Trace lower extremity Abdomen: Soft, nontender, nondistended sounds present GU: Deferred Musculoskeletal: No clubbing or cyanosis.  Right shoulder is immobilized in a sling Skin: No appreciable rashes or lesions limited skin evaluation Neurologic: Cranial nerves II through XII grossly intact no appreciable focal deficits Psychiatric: Judgment and insight.  Patient awake alert and oriented x2.  Patient is very anxious but has a normal affect  Data Reviewed: I have personally reviewed following labs and imaging studies  CBC: Recent Labs  Lab 11/20/17 0828 11/21/17 0501 11/21/17 1733 11/22/17 0839  WBC 12.0* 11.7* 13.8* 13.6*  NEUTROABS 10.2*  --  11.1* 10.3*  HGB 13.3 13.7 12.7 13.5  HCT 41.1 43.0 39.5 41.5  MCV 86.3 86.9 86.6 86.1  PLT 260 257 231 626   Basic Metabolic Panel: Recent Labs  Lab 11/20/17 0828 11/21/17 0501 11/22/17 0430  NA 138 138 133*  K 3.9 4.8 3.9  CL 101 99 99  CO2 26 29 28   GLUCOSE 156* 106* 119*  BUN 35* 22 23  CREATININE 1.11* 1.00 0.92  CALCIUM 9.2 9.2 8.5*  MG   --  2.0 1.8  PHOS  --   --  3.1   GFR: Estimated Creatinine Clearance: 38.3 mL/min (by C-G formula based on SCr of 0.92 mg/dL). Liver Function Tests: Recent Labs  Lab 11/20/17 0828 11/21/17 0501 11/22/17 0430  AST 26 21 16   ALT 21 22 16   ALKPHOS 77 69 62  BILITOT 1.0 1.0 1.2  PROT 7.3 7.1 6.4*  ALBUMIN 4.0 3.8 3.3*   No results for input(s): LIPASE, AMYLASE in the last 168 hours. No results for input(s): AMMONIA in the last 168 hours. Coagulation Profile: Recent Labs  Lab 11/20/17 0828 11/21/17 0501 11/22/17 0430  INR 2.41 2.15 2.53   Cardiac Enzymes: No results for input(s): CKTOTAL, CKMB, CKMBINDEX, TROPONINI in the last 168 hours. BNP (last 3 results) No results for input(s): PROBNP in the last 8760 hours. HbA1C: Recent Labs    11/20/17 1544  HGBA1C 6.0*   CBG: No results for input(s): GLUCAP in the last 168 hours. Lipid Profile: No results for input(s): CHOL, HDL, LDLCALC, TRIG, CHOLHDL, LDLDIRECT in the last 72 hours. Thyroid Function Tests: Recent Labs    11/22/17  1041  TSH 3.084  FREET4 1.05   Anemia Panel: No results for input(s): VITAMINB12, FOLATE, FERRITIN, TIBC, IRON, RETICCTPCT in the last 72 hours. Sepsis Labs: No results for input(s): PROCALCITON, LATICACIDVEN in the last 168 hours.  No results found for this or any previous visit (from the past 240 hour(s)).   Radiology Studies: Dg Wrist Complete Right  Result Date: 11/21/2017 CLINICAL DATA:  Wrist pain after fall EXAM: RIGHT WRIST - COMPLETE 3+ VIEW COMPARISON:  None. FINDINGS: No fracture or malalignment. Arthritis at the distal radial carpal interval. Cartilaginous calcifications. Moderate-to-marked arthritis at the first [facility] joint. IMPRESSION: 1. No acute osseous abnormality 2. Moderate-to-marked arthritis first CMC joint. 3. Chondrocalcinosis Electronically Signed   By: Donavan Foil M.D.   On: 11/21/2017 16:45   Scheduled Meds: . amLODipine  5 mg Oral Daily  . cholecalciferol  1,000  Units Oral Daily  . digoxin  0.125 mg Oral BH-q7a  . levothyroxine  125 mcg Oral QAC breakfast  . polyethylene glycol  17 g Oral Daily  . senna-docusate  1 tablet Oral BID  . sertraline  25 mg Oral Daily  . vitamin B-12  1,000 mcg Oral Daily  . warfarin  3 mg Oral ONCE-1800  . Warfarin - Pharmacist Dosing Inpatient   Does not apply q1800   Continuous Infusions: . sodium chloride 75 mL/hr at 11/22/17 1129    LOS: 1 day   Kerney Elbe, DO [facility] PAGER is on [place]  If 7PM-7AM, please contact night-coverage [email or web address] Password [email or web address] 11/22/2017, 5:09 PM

## 2017-11-23 DIAGNOSIS — I482 Chronic atrial fibrillation: Secondary | ICD-10-CM

## 2017-11-23 DIAGNOSIS — S42021A Displaced fracture of shaft of right clavicle, initial encounter for closed fracture: Principal | ICD-10-CM

## 2017-11-23 DIAGNOSIS — I10 Essential (primary) hypertension: Secondary | ICD-10-CM

## 2017-11-23 DIAGNOSIS — Z950 Presence of cardiac pacemaker: Secondary | ICD-10-CM

## 2017-11-23 LAB — COMPREHENSIVE METABOLIC PANEL
ALBUMIN: 2.8 g/dL — AB (ref 3.5–5.0)
ALT: UNDETERMINED U/L (ref 0–44)
AST: UNDETERMINED U/L (ref 15–41)
Alkaline Phosphatase: 60 U/L (ref 38–126)
Anion gap: 11 (ref 5–15)
BUN: 18 mg/dL (ref 8–23)
CHLORIDE: 101 mmol/L (ref 98–111)
CO2: 22 mmol/L (ref 22–32)
CREATININE: 0.84 mg/dL (ref 0.44–1.00)
Calcium: 8.7 mg/dL — ABNORMAL LOW (ref 8.9–10.3)
GFR calc Af Amer: >60 mL/min (ref >60)
GFR calc non Af Amer: >60 mL/min (ref >60)
GLUCOSE: 94 mg/dL (ref 70–99)
POTASSIUM: 4.2 mmol/L (ref 3.5–5.1)
SODIUM: 134 mmol/L — AB (ref 135–145)
Total Bilirubin: UNDETERMINED mg/dL (ref 0.3–1.2)
Total Protein: 5.8 g/dL — ABNORMAL LOW (ref 6.5–8.1)

## 2017-11-23 LAB — CBC WITH DIFFERENTIAL/PLATELET
Abs Immature Granulocytes: 0.1 10*3/uL (ref 0.0–0.1)
Basophils Absolute: 0.1 10*3/uL (ref 0.0–0.1)
Basophils Relative: 1 %
EOS PCT: 2 %
Eosinophils Absolute: 0.2 10*3/uL (ref 0.0–0.7)
HEMATOCRIT: 40.6 % (ref 36.0–46.0)
Hemoglobin: 13.2 g/dL (ref 12.0–15.0)
Immature Granulocytes: 1 %
LYMPHS ABS: 1.1 10*3/uL (ref 0.7–4.0)
LYMPHS PCT: 9 %
MCH: 27.8 pg (ref 26.0–34.0)
MCHC: 32.5 g/dL (ref 30.0–36.0)
MCV: 85.5 fL (ref 78.0–100.0)
MONO ABS: 2 10*3/uL — AB (ref 0.1–1.0)
Monocytes Relative: 17 %
Neutro Abs: 8.6 10*3/uL — ABNORMAL HIGH (ref 1.7–7.7)
Neutrophils Relative %: 72 %
PLATELETS: 226 10*3/uL (ref 150–400)
RBC: 4.75 MIL/uL (ref 3.87–5.11)
RDW: 14.7 % (ref 11.5–15.5)
WBC: 12 10*3/uL — ABNORMAL HIGH (ref 4.0–10.5)

## 2017-11-23 LAB — PHOSPHORUS: Phosphorus: 2.3 mg/dL — ABNORMAL LOW (ref 2.5–4.6)

## 2017-11-23 LAB — ALT: ALT: 15 U/L (ref 0–44)

## 2017-11-23 LAB — BILIRUBIN, TOTAL: BILIRUBIN TOTAL: 0.9 mg/dL (ref 0.3–1.2)

## 2017-11-23 LAB — PROTIME-INR
INR: 2.14
Prothrombin Time: 23.7 seconds — ABNORMAL HIGH (ref 11.4–15.2)

## 2017-11-23 LAB — MAGNESIUM
MAGNESIUM: 1.7 mg/dL (ref 1.7–2.4)
Magnesium: UNDETERMINED mg/dL (ref 1.7–2.4)

## 2017-11-23 LAB — AST: AST: 17 U/L (ref 15–41)

## 2017-11-23 MED ORDER — OXYCODONE-ACETAMINOPHEN 5-325 MG PO TABS: 1.0000 | ORAL_TABLET | ORAL | 0 refills | Status: DC | PRN

## 2017-11-23 MED ORDER — WARFARIN SODIUM 4 MG PO TABS
4.0000 mg | ORAL_TABLET | Freq: Once | ORAL | Status: AC
  Administered 2017-11-23: 4 mg via ORAL
  Filled 2017-11-23: qty 1

## 2017-11-23 MED ORDER — SENNOSIDES-DOCUSATE SODIUM 8.6-50 MG PO TABS: 1.0000 | ORAL_TABLET | Freq: Two times a day (BID) | ORAL | Status: DC

## 2017-11-23 MED ORDER — POLYETHYLENE GLYCOL 3350 17 G PO PACK: 17.0000 g | PACK | Freq: Every day | ORAL | 0 refills | Status: DC

## 2017-11-23 MED ORDER — METHOCARBAMOL 500 MG PO TABS: 500.0000 mg | ORAL_TABLET | Freq: Three times a day (TID) | ORAL | 0 refills | Status: DC | PRN

## 2017-11-23 NOTE — Care Management Note (Signed)
Case Management Note  Patient Details  Name: [name] MRN: [id number] Date of Birth: 1930-08-09  Subjective/Objective:                 Fractured clavicle after fall at home.    Action/Plan:  CSW following for anticipated DC to SNF possibly tomorrow if pain under control, WBC stabilized, and medically stable.   Expected Discharge Date:                  Expected Discharge Plan:  Skilled Nursing Facility  In-House Referral:  Clinical Social Work  Discharge planning Services     Post Acute Care Choice:    Choice offered to:     DME Arranged:    DME Agency:     HH Arranged:    [place] Agency:     Status of Service:  Completed, signed off  If discussed at [employer], dates discussed:    Additional Comments:  Carles Collet, RN 11/23/2017, 10:48 AM

## 2017-11-23 NOTE — Clinical Social Work Note (Signed)
Clinical Social Work Assessment  Patient Details  Name: [name] MRN: [id number] Date of Birth: 07-11-1930  Date of referral:                  Reason for consult:  Facility Placement, Discharge Planning                Permission sought to share information with:  Facility Sport and exercise psychologist, Family Supports Permission granted to share information::  Yes, Verbal Permission Granted  Name::      [name]   Agency::   [facility]  Relationship::   daughter  Contact Information:   [phone]  Housing/Transportation Living arrangements for the past 2 months:  [place] of Information:  Adult Children Patient Interpreter Needed:  None Criminal Activity/Legal Involvement Pertinent to Current Situation/Hospitalization:  No - Comment as needed Significant Relationships:  None Lives with:  Self Do you feel safe going back to the place where you live?  Yes Need for family participation in patient care:  Yes (Comment)  Care giving concerns:  Pt currently in the process of moving out of her home, into a home that is being built that is more handicapped accessible. Pt daughter states she would like for pt to have SNF prior to moving into the new home.    Social Worker assessment / plan:  CSW spoke with pt and pt family at bedside. Pt limited in her participation in assessment due to pain but was listening and responded appropriately throughout. Pt son, pt daughter, and pt son in law all present. Pt family interested in SNF for pt, she had taken a fall at home and broke her clavicle, the plan is currently not to have surgery but rather to see if it is needed later as an outpatient. Pt children interested in looking at offers and discussing with pt. Pt family states that they know this will be short term, and if they need to pay and extend pt's stay prior to return to her new home when ready they could do so.   Pt family looked over offers given and  have chosen [facility], following for medical stability to support discharge.   Employment status:  Retired Forensic scientist:  [employer] PT Recommendations:  [place], [place] / Referral to community resources:  [place]  Patient/Family's Response to care:  Pt and pt family understanding of CSW role, SNF recommendations and decision to pursue SNF.   Patient/Family's Understanding of and Emotional Response to Diagnosis, Current Treatment, and Prognosis:  Pt visibly in pain, but she and pt family state understanding of diagnosis, current treatment and prognosis. Pt family express reasonable expectations for pt progress and hope for improvements with therapy so she can return to her new home with assistance from 24/7 aides.   Emotional Assessment Appearance:  Appears stated age Attitude/Demeanor/Rapport:  Engaged, Gracious Affect (typically observed):  Accepting, Adaptable, Appropriate, Pleasant Orientation:  Oriented to Self, Oriented to Place, Oriented to  Time, Oriented to Situation Alcohol / Substance use:  Not Applicable Psych involvement (Current and /or in the community):  No (Comment)  Discharge Needs  Concerns to be addressed:  Care Coordination Readmission within the last 30 days:  No Current discharge risk:  Physical Impairment, Lives alone Barriers to Discharge:  Continued Medical Work up   [employer], [place] 11/23/2017, 4:21 PM

## 2017-11-23 NOTE — Progress Notes (Signed)
ANTICOAGULATION CONSULT NOTE  Pharmacy Consult:  Coumadin Indication: atrial fibrillation  Allergies  Allergen Reactions  . Penicillins Hives and Rash    Has patient had a PCN reaction causing immediate rash, facial/tongue/throat swelling, SOB or lightheadedness with hypotension: unkn Has patient had a PCN reaction causing severe rash involving mucus membranes or skin necrosis: unkn Has patient had a PCN reaction that required hospitalization: unkn Has patient had a PCN reaction occurring within the last 10 years: unkn If all of the above answers are "NO", then may proceed with Cephalosporin use.     Patient Measurements: Height: 5\' 1"  (154.9 cm) Weight: 140 lb 10.5 oz (63.8 kg) IBW/kg (Calculated) : 47.8  Vital Signs: Temp: 98 F (36.7 C) (09/04 0835) Temp Source: Oral (09/04 0835) BP: 158/67 (09/04 0835) Pulse Rate: 63 (09/04 0835)  Labs: Recent Labs    11/21/17 0501 11/21/17 1733 11/22/17 0430 11/22/17 0839 11/23/17 0606  HGB 13.7 12.7  --  13.5 13.2  HCT 43.0 39.5  --  41.5 40.6  PLT 257 231  --  226 226  LABPROT 23.9*  --  27.0*  --  23.7*  INR 2.15  --  2.53  --  2.14  CREATININE 1.00  --  0.92  --  0.84    Estimated Creatinine Clearance: 41.9 mL/min (by C-G formula based on SCr of 0.84 mg/dL).   Assessment: 87 YOF on Coumadin PTA for history of Afib. Coumadin initially held due to clavicle fracture but resumed on 11/21/17.  INR therapeutic; no bleeding reported.   Goal of Therapy:  INR 2-3 Monitor platelets by anticoagulation protocol: Yes    Plan:  Coumadin 4mg  PO today Daily PT / INR   Oaklee Sunga D. Mina Marble, PharmD, BCPS, [place] 11/23/2017, 12:39 PM

## 2017-11-23 NOTE — Progress Notes (Signed)
[facility] PROGRESS NOTE  [name] ZMO:[id number] DOB: 06-07-1930 DOA: 11/20/2017  PCP: Burnard Bunting, MD  Brief History/Interval Summary: 84 y.o.femalewith medical history significant forchronic atrial fibrillation on Coumadin, post cardiac pacemaker, hypothyroidism, chronic depression/anxiety who presented to ED [facility] from home after a fall at home.  This was a mechanical fall.  She presented with right shoulder pain and was found to have mid to distal clavicle fracture.  Patient was hospitalized for pain control.   Reason for Visit: Right clavicular fracture  Consultants: Phone discussion with Dr. Doran Durand with orthopedic surgery  Procedures:   Transthoracic echocardiogram Study Conclusions  - Left ventricle: The cavity size was normal. Systolic function was   normal. The estimated ejection fraction was in the range of 60%   to 65%. Wall motion was normal; there were no regional wall   motion abnormalities. - Aortic valve: Cusp separation was mildly reduced. Transvalvular   velocity was increased. There was mild to moderate stenosis.   Valve area (VTI): 0.92 cm^2. Valve area (Vmax): 0.86 cm^2. Valve   area (Vmean): 1.03 cm^2. - Mitral valve: Mildly to moderately calcified annulus. Mildly   calcified leaflets . There was mild to moderate regurgitation. - Left atrium: The atrium was severely dilated. - Right atrium: The atrium was severely dilated. - Tricuspid valve: There was moderate regurgitation. - Pulmonary arteries: Systolic pressure was moderately increased.   PA peak pressure: 53 mm Hg (S).  Antibiotics: None  Subjective/Interval History: Patient states that her pain is reasonably well controlled.  She is having bowel movements.  Denies any chest pain or shortness of breath.  ROS: Denies any headaches  Objective:  Vital Signs  Vitals:   11/22/17 2127 11/22/17 2200 11/23/17 0445 11/23/17 0835  BP: 130/64  (!) 165/85 (!) 158/67  Pulse:  61  (!) 58 63  Resp: 12  16   Temp: 98.5 F (36.9 C)  98.2 F (36.8 C) 98 F (36.7 C)  TempSrc: Oral  Oral Oral  SpO2: 96% 93% 95% 94%  Weight:   63.8 kg   Height:        Intake/Output Summary (Last 24 hours) at 11/23/2017 1159 Last data filed at 11/23/2017 0900 Gross per 24 hour  Intake 1759.22 ml  Output 700 ml  Net 1059.22 ml   Filed Weights   11/20/17 0803 11/21/17 0630 11/23/17 0445  Weight: 63.5 kg 63.7 kg 63.8 kg    General appearance: alert, cooperative, appears stated age and no distress Head: Normocephalic, without obvious abnormality, atraumatic Resp: Normal effort at rest.  Clear to auscultation bilaterally. Cardio: regular rate and rhythm, S1, S2 normal, no murmur, click, rub or gallop GI: soft, non-tender; bowel sounds normal; no masses,  no organomegaly Extremities: extremities normal, atraumatic, no cyanosis or edema Neurologic: Awake alert.  Oriented x3.  No focal neurological deficits.  Lab Results:  Data Reviewed: I have personally reviewed following labs and imaging studies  CBC: Recent Labs  Lab 11/20/17 0828 11/21/17 0501 11/21/17 1733 11/22/17 0839 11/23/17 0606  WBC 12.0* 11.7* 13.8* 13.6* 12.0*  NEUTROABS 10.2*  --  11.1* 10.3* 8.6*  HGB 13.3 13.7 12.7 13.5 13.2  HCT 41.1 43.0 39.5 41.5 40.6  MCV 86.3 86.9 86.6 86.1 85.5  PLT 260 257 231 226 035    Basic Metabolic Panel: Recent Labs  Lab 11/20/17 0828 11/21/17 0501 11/22/17 0430 11/23/17 0606 11/23/17 1003  NA 138 138 133* 134*  --   K 3.9 4.8 3.9 4.2  --  CL 101 99 99 101  --   CO2 26 29 28 22   --   GLUCOSE 156* 106* 119* 94  --   BUN 35* 22 23 18   --   CREATININE 1.11* 1.00 0.92 0.84  --   CALCIUM 9.2 9.2 8.5* 8.7*  --   MG  --  2.0 1.8 QUANTITY NOT SUFFICIENT, UNABLE TO PERFORM TEST 1.7  PHOS  --   --  3.1 2.3*  --     GFR: Estimated Creatinine Clearance: 41.9 mL/min (by C-G formula based on SCr of 0.84 mg/dL).  Liver Function Tests: Recent Labs  Lab 11/20/17 0828  11/21/17 0501 11/22/17 0430 11/23/17 0606 11/23/17 1003  AST 26 21 16  QUANTITY NOT SUFFICIENT, UNABLE TO PERFORM TEST 17  ALT 21 22 16  QUANTITY NOT SUFFICIENT, UNABLE TO PERFORM TEST 15  ALKPHOS 77 69 62 60  --   BILITOT 1.0 1.0 1.2 QUANTITY NOT SUFFICIENT, UNABLE TO PERFORM TEST 0.9  PROT 7.3 7.1 6.4* 5.8*  --   ALBUMIN 4.0 3.8 3.3* 2.8*  --      Coagulation Profile: Recent Labs  Lab 11/20/17 0828 11/21/17 0501 11/22/17 0430 11/23/17 0606  INR 2.41 2.15 2.53 2.14    HbA1C: Recent Labs    11/20/17 1544  HGBA1C 6.0*    Thyroid Function Tests: Recent Labs    11/22/17 1041  TSH 3.084  FREET4 1.05     Radiology Studies: Dg Wrist Complete Right  Result Date: 11/21/2017 CLINICAL DATA:  Wrist pain after fall EXAM: RIGHT WRIST - COMPLETE 3+ VIEW COMPARISON:  None. FINDINGS: No fracture or malalignment. Arthritis at the distal radial carpal interval. Cartilaginous calcifications. Moderate-to-marked arthritis at the first [facility] joint. IMPRESSION: 1. No acute osseous abnormality 2. Moderate-to-marked arthritis first [facility] joint. 3. Chondrocalcinosis Electronically Signed   By: Donavan Foil M.D.   On: 11/21/2017 16:45     Medications:  Scheduled: . amLODipine  5 mg Oral Daily  . cholecalciferol  1,000 Units Oral Daily  . digoxin  0.125 mg Oral BH-q7a  . levothyroxine  125 mcg Oral QAC breakfast  . polyethylene glycol  17 g Oral Daily  . senna-docusate  1 tablet Oral BID  . sertraline  25 mg Oral Daily  . vitamin B-12  1,000 mcg Oral Daily  . Warfarin - Pharmacist Dosing Inpatient   Does not apply q1800   Continuous:  OEU:MPNTIRWERXV, hydrALAZINE, HYDROmorphone (DILAUDID) injection, meclizine, methocarbamol, ondansetron (ZOFRAN) IV, oxyCODONE-acetaminophen  Assessment/Plan:    Intractable pain secondary to acute right clavicular fracture secondary to mechanical fall Case was discussed with Dr. Onnie Graham and with Dr. Doran Durand with orthopedics.  Plan is for shoulder  immobilization and follow-up with orthopedics in the outpatient setting.  No role for surgical intervention currently.  Patient did have severe pain at the beginning.  She was initially started on OxyContin which was changed over to oxycodone.  Pain is now better controlled.  She is also on Robaxin.  Bowel regimen.  Seen by therapy.  Skilled nursing facility is recommended for rehab.  Continue with sling.  Generalized weakness/debility/ambulatory dysfunction Patient reported frequent mechanical falls.  Apart from the clavicular fracture no other injuries noted on imaging studies.  Seen by PT and OT.  Skilled nursing facility is recommended for short-term rehab.  Right wrist pain No obvious injuries noted on the x-ray.  Pain control.  Chronic atrial fibrillation on warfarin Stable.  Continue warfarin as there is no plan for surgical intervention currently.  Hypothyroidism TSH and  free T4 normal.  Continue levothyroxine.  Accelerated hypertension Blood pressure initially high due to pain.  Now better controlled.  Continue amlodipine.  Leukocytosis Thought to be reactive.  No evidence for any infection.  Chronic kidney disease stage III Renal function is stable.  Continue to monitor closely.  Status post cardiac pacemaker Followed by Dr. Wynonia Lawman.  Echocardiogram as above.  Rhythm stable.  Can be pursued further as an outpatient.  Hyponatremia Stable.  Chronic depression and anxiety Continue home medications.  DVT Prophylaxis: Warfarin    Code Status: Full code Family Communication: Discussed with the patient Disposition Plan: Plan is for her to go to skilled nursing facility for short-term rehab.    LOS: 2 days   [facility] Pager [phone] 11/23/2017, 11:59 AM  If 7PM-7AM, please contact night-coverage at [email or web address], password [email or web address]

## 2017-11-23 NOTE — Progress Notes (Signed)
Occupational Therapy Treatment Patient Details Name: [name] MRN: [id number] DOB: 1930/07/05 Today's Date: 11/23/2017    History of present illness [name] is a 84 y.o. female with medical history significant for chronic atrial fibrillation on Coumadin, post cardiac pacemaker, hypothyroidism, chronic depression/anxiety who presented to ED [facility] from home after a fall at home earlier this morning. Fell on R shoulder with R clavicular fx.    OT comments  Pt presents sitting up in recliner agreeable to treatment session. Initiated education with pt and pt's daughter regarding compensatory strategies for performing UB ADLs given current RUE deficits. Pt able to perform gentle A/AAROM to R forearm, wrist, digits within tolerance level. She continues to require increased assist for UB/LB ADLs. Feel SNF recommendation remains appropriate at this time. Will continue to follow acutely to progress pt towards established OT goals.   Follow Up Recommendations  SNF;Supervision/Assistance - 24 hour    Equipment Recommendations  Other (comment)(TBD in next venue )          Precautions / Restrictions Precautions Precautions: Fall Precaution Comments: pt has fallen several times recently with other injuries Required Braces or Orthoses: Sling Restrictions Weight Bearing Restrictions: Yes RUE Weight Bearing: Non weight bearing Other Position/Activity Restrictions: no WB'ing orders specified in notes but kept pt NWB for pain control and while waiting for decision on sx       Mobility Bed Mobility               General bed mobility comments: OOB in recliner upon arrival                                                                    ADL either performed or assessed with clinical judgement   ADL   Eating/Feeding: Set up;Sitting Eating/Feeding Details (indicate cue type and reason): setup to place waterbottle in L hand; increased time  and effort as pt typically R handed                                    General ADL Comments: began education regarding compensatory strategies for completing UB ADLs with pt/pt's daughter. Pt drowsy during session requiring cues to follow through with tasks                        Cognition Arousal/Alertness: Awake/alert(intermittently drowsy ) Behavior During Therapy: WFL for tasks assessed/performed Overall Cognitive Status: Within Functional Limits for tasks assessed                                 General Comments: pt increasingly drowsy as session progressed, occasionally requires assist to redirect to task         Exercises Other Exercises Other Exercises: gentle A/P/AAROM to digits, wrist, and forearm RUE   Shoulder Instructions       General Comments      Pertinent Vitals/ Pain       Pain Assessment: Faces Faces Pain Scale: Hurts little more Pain Location: R clavicle Pain Descriptors / Indicators: Sharp;Aching;Guarding Pain Intervention(s): Monitored during session;Limited activity within patient's tolerance  Home Living                                          Prior Functioning/Environment              Frequency  Min 2X/week        Progress Toward Goals  OT Goals(current goals can now be found in the care plan section)  Progress towards OT goals: Progressing toward goals  Acute Rehab OT Goals Patient Stated Goal: return home OT Goal Formulation: With patient Time For Goal Achievement: 12/05/17 Potential to Achieve Goals: Good  Plan Discharge plan remains appropriate    Co-evaluation                 AM-PAC PT "6 Clicks" Daily Activity     Outcome Measure   Help from another person eating meals?: A Little Help from another person taking care of personal grooming?: A Lot Help from another person toileting, which includes using toliet, bedpan, or urinal?: A Lot Help from another  person bathing (including washing, rinsing, drying)?: A Lot Help from another person to put on and taking off regular upper body clothing?: A Lot Help from another person to put on and taking off regular lower body clothing?: Total 6 Click Score: 12    End of Session Equipment Utilized During Treatment: Other (comment)(sling)  OT Visit Diagnosis: Unsteadiness on feet (R26.81);Other abnormalities of gait and mobility (R26.89);Repeated falls (R29.6);Muscle weakness (generalized) (M62.81);History of falling (Z91.81);Pain Pain - Right/Left: Right Pain - part of body: Shoulder   Activity Tolerance Patient tolerated treatment well   Patient Left in chair;with call bell/phone within reach;with family/visitor present   Nurse Communication Mobility status        Time: 2355-7322 OT Time Calculation (min): 22 min  Charges: OT General Charges $OT Visit: 1 Visit OT Treatments $Self Care/Home Management : 8-22 mins  Lou Cal, OT Pager [phone] 11/23/2017    Raymondo Band 11/23/2017, 5:37 PM

## 2017-11-24 DIAGNOSIS — D72829 Elevated white blood cell count, unspecified: Secondary | ICD-10-CM | POA: Diagnosis not present

## 2017-11-24 DIAGNOSIS — I482 Chronic atrial fibrillation: Secondary | ICD-10-CM | POA: Diagnosis not present

## 2017-11-24 DIAGNOSIS — S42009A Fracture of unspecified part of unspecified clavicle, initial encounter for closed fracture: Secondary | ICD-10-CM | POA: Diagnosis not present

## 2017-11-24 DIAGNOSIS — I4891 Unspecified atrial fibrillation: Secondary | ICD-10-CM | POA: Diagnosis not present

## 2017-11-24 DIAGNOSIS — M25531 Pain in right wrist: Secondary | ICD-10-CM | POA: Diagnosis not present

## 2017-11-24 DIAGNOSIS — R42 Dizziness and giddiness: Secondary | ICD-10-CM | POA: Diagnosis not present

## 2017-11-24 DIAGNOSIS — F329 Major depressive disorder, single episode, unspecified: Secondary | ICD-10-CM | POA: Diagnosis not present

## 2017-11-24 DIAGNOSIS — S42001D Fracture of unspecified part of right clavicle, subsequent encounter for fracture with routine healing: Secondary | ICD-10-CM | POA: Diagnosis not present

## 2017-11-24 DIAGNOSIS — Z743 Need for continuous supervision: Secondary | ICD-10-CM | POA: Diagnosis not present

## 2017-11-24 DIAGNOSIS — R278 Other lack of coordination: Secondary | ICD-10-CM | POA: Diagnosis not present

## 2017-11-24 DIAGNOSIS — R41841 Cognitive communication deficit: Secondary | ICD-10-CM | POA: Diagnosis not present

## 2017-11-24 DIAGNOSIS — K5900 Constipation, unspecified: Secondary | ICD-10-CM | POA: Diagnosis not present

## 2017-11-24 DIAGNOSIS — R0902 Hypoxemia: Secondary | ICD-10-CM | POA: Diagnosis not present

## 2017-11-24 DIAGNOSIS — T7840XD Allergy, unspecified, subsequent encounter: Secondary | ICD-10-CM | POA: Diagnosis not present

## 2017-11-24 DIAGNOSIS — N183 Chronic kidney disease, stage 3 (moderate): Secondary | ICD-10-CM | POA: Diagnosis not present

## 2017-11-24 DIAGNOSIS — Z950 Presence of cardiac pacemaker: Secondary | ICD-10-CM | POA: Diagnosis not present

## 2017-11-24 DIAGNOSIS — W19XXXA Unspecified fall, initial encounter: Secondary | ICD-10-CM | POA: Diagnosis not present

## 2017-11-24 DIAGNOSIS — M25511 Pain in right shoulder: Secondary | ICD-10-CM | POA: Diagnosis not present

## 2017-11-24 DIAGNOSIS — M6281 Muscle weakness (generalized): Secondary | ICD-10-CM | POA: Diagnosis not present

## 2017-11-24 DIAGNOSIS — S42001A Fracture of unspecified part of right clavicle, initial encounter for closed fracture: Secondary | ICD-10-CM | POA: Diagnosis not present

## 2017-11-24 DIAGNOSIS — E039 Hypothyroidism, unspecified: Secondary | ICD-10-CM | POA: Diagnosis not present

## 2017-11-24 DIAGNOSIS — Z7901 Long term (current) use of anticoagulants: Secondary | ICD-10-CM | POA: Diagnosis not present

## 2017-11-24 DIAGNOSIS — I10 Essential (primary) hypertension: Secondary | ICD-10-CM | POA: Diagnosis not present

## 2017-11-24 DIAGNOSIS — R531 Weakness: Secondary | ICD-10-CM | POA: Diagnosis not present

## 2017-11-24 DIAGNOSIS — W19XXXD Unspecified fall, subsequent encounter: Secondary | ICD-10-CM | POA: Diagnosis not present

## 2017-11-24 DIAGNOSIS — R2681 Unsteadiness on feet: Secondary | ICD-10-CM | POA: Diagnosis not present

## 2017-11-24 DIAGNOSIS — S42021A Displaced fracture of shaft of right clavicle, initial encounter for closed fracture: Secondary | ICD-10-CM | POA: Diagnosis not present

## 2017-11-24 DIAGNOSIS — R079 Chest pain, unspecified: Secondary | ICD-10-CM | POA: Diagnosis not present

## 2017-11-24 DIAGNOSIS — F419 Anxiety disorder, unspecified: Secondary | ICD-10-CM | POA: Diagnosis not present

## 2017-11-24 DIAGNOSIS — R279 Unspecified lack of coordination: Secondary | ICD-10-CM | POA: Diagnosis not present

## 2017-11-24 LAB — PROTIME-INR
INR: 2.11
Prothrombin Time: 23.4 seconds — ABNORMAL HIGH (ref 11.4–15.2)

## 2017-11-24 MED ORDER — WARFARIN SODIUM 4 MG PO TABS
4.0000 mg | ORAL_TABLET | Freq: Once | ORAL | Status: DC
  Filled 2017-11-24: qty 1

## 2017-11-24 NOTE — Progress Notes (Signed)
Pt discharged to [facility] in stable condition. Transported by [facility]. Discharge instructions given to pt and her family,no concerns voiced

## 2017-11-24 NOTE — Clinical Social Work Placement (Signed)
   CLINICAL SOCIAL WORK PLACEMENT  NOTE [facility] [place] RN to call report to [phone]  Date:  11/24/2017  Patient Details  Name: [name] MRN: [id number] Date of Birth: Aug 10, 1930  Clinical Social Work is seeking post-discharge placement for this patient at the [place] level of care (*CSW will initial, date and re-position this form in  chart as items are completed):  Yes   Patient/family provided with [facility] Work Department's list of facilities offering this level of care within the geographic area requested by the patient (or if unable, by the patient's family).  Yes   Patient/family informed of their freedom to choose among providers that offer the needed level of care, that participate in Medicare, Medicaid or managed care program needed by the patient, have an available bed and are willing to accept the patient.  Yes   Patient/family informed of [facility]'s ownership interest in [facility] and [facility], as well as of the fact that they are under no obligation to receive care at these facilities.  PASRR submitted to EDS on       PASRR number received on 11/22/17     Existing PASRR number confirmed on       FL2 transmitted to all facilities in geographic area requested by pt/family on 11/22/17     FL2 transmitted to all facilities within larger geographic area on       Patient informed that his/her managed care company has contracts with or will negotiate with certain facilities, including the following:        Yes   Patient/family informed of bed offers received.  Patient chooses bed at [place], [place]     Physician recommends and patient chooses bed at      Patient to be transferred to [place] on 11/24/17.  Patient to be transferred to facility by [facility]     Patient family notified on 11/24/17 of transfer.  Name of family member notified:  pt daughter, susan      PHYSICIAN       Additional Comment:    _______________________________________________ [place], [place] 11/24/2017, 12:32 PM

## 2017-11-24 NOTE — Progress Notes (Signed)
ANTICOAGULATION CONSULT NOTE  Pharmacy Consult:  Coumadin Indication: atrial fibrillation  Allergies  Allergen Reactions  . Penicillins Hives and Rash    Has patient had a PCN reaction causing immediate rash, facial/tongue/throat swelling, SOB or lightheadedness with hypotension: unkn Has patient had a PCN reaction causing severe rash involving mucus membranes or skin necrosis: unkn Has patient had a PCN reaction that required hospitalization: unkn Has patient had a PCN reaction occurring within the last 10 years: unkn If all of the above answers are "NO", then may proceed with Cephalosporin use.     Patient Measurements: Height: 5\' 1"  (154.9 cm) Weight: 165 lb (74.8 kg) IBW/kg (Calculated) : 47.8  Vital Signs: Temp: 98.5 F (36.9 C) (09/05 0528) Temp Source: Oral (09/05 0528) BP: 177/86 (09/05 0528) Pulse Rate: 59 (09/05 0528)  Labs: Recent Labs    11/21/17 1733 11/22/17 0430 11/22/17 0839 11/23/17 0606 11/24/17 0527  HGB 12.7  --  13.5 13.2  --   HCT 39.5  --  41.5 40.6  --   PLT 231  --  226 226  --   LABPROT  --  27.0*  --  23.7* 23.4*  INR  --  2.53  --  2.14 2.11  CREATININE  --  0.92  --  0.84  --     Estimated Creatinine Clearance: 45.3 mL/min (by C-G formula based on SCr of 0.84 mg/dL).   Assessment: 87 YOF on Coumadin PTA for history of Afib. Coumadin initially held due to clavicle fracture but resumed on 11/21/17.  INR therapeutic; no bleeding reported.   Goal of Therapy:  INR 2-3 Monitor platelets by anticoagulation protocol: Yes    Plan:  Repeat Coumadin 4mg  PO today Daily PT / INR   Jeyli Zwicker D. Mina Marble, PharmD, BCPS, [place] 11/24/2017, 11:24 AM

## 2017-11-24 NOTE — Discharge Summary (Signed)
[facility]  Physician Discharge Summary   Patient ID: [name] MRN: [id number] DOB/AGE: 1930-11-18 84 y.o.  Admit date: 11/20/2017 Discharge date: 11/24/2017  PCP: Jennifer Bunting, MD  DISCHARGE DIAGNOSES:  Acute right clavicular fracture requiring outpatient follow-up Generalized weakness and debility requiring short-term rehab Chronic atrial fibrillation on warfarin Hypothyroidism Essential hypertension Chronic kidney disease stage III Cardiac pacemaker in situ   RECOMMENDATIONS FOR OUTPATIENT FOLLOW UP: 1. Please schedule appointment with Dr. Onnie [name] or Dr. Doran [name] with orthopedics within the next few days. 2. PT/INR per protocol  DISCHARGE CONDITION: fair  Diet recommendation: Heart healthy  Filed Weights   11/21/17 0630 11/23/17 0445 11/24/17 0527  Weight: 63.7 kg 63.8 kg 74.8 kg    INITIAL HISTORY:  84 y.o.femalewith medical history significant forchronic atrial fibrillation on Coumadin, post cardiac pacemaker, hypothyroidism, chronic depression/anxiety who presented to ED [facility] from home after a fall at home.  This was a mechanical fall.  She presented with right shoulder pain and was found to have mid to distal clavicle fracture.  Patient was hospitalized for pain control.   Consultants: Phone discussion with Dr. Doran [name] with orthopedic surgery  Procedures:   Transthoracic echocardiogram Study Conclusions  - Left ventricle: The cavity size was normal. Systolic function was normal. The estimated ejection fraction was in the range of 60% to 65%. Wall motion was normal; there were no regional wall motion abnormalities. - Aortic valve: Cusp separation was mildly reduced. Transvalvular velocity was increased. There was mild to moderate stenosis. Valve area (VTI): 0.92 cm^2. Valve area (Vmax): 0.86 cm^2. Valve area (Vmean): 1.03 cm^2. - Mitral valve: Mildly to moderately calcified annulus. Mildly calcified leaflets . There was  mild to moderate regurgitation. - Left atrium: The atrium was severely dilated. - Right atrium: The atrium was severely dilated. - Tricuspid valve: There was moderate regurgitation. - Pulmonary arteries: Systolic pressure was moderately increased. PA peak pressure: 53 mm Hg (S).   HOSPITAL COURSE:   Intractable pain secondary to acute right clavicular fracture secondary to mechanical fall Case was discussed with Dr. Onnie [name] and with Dr. Doran [name] with orthopedics.  Plan is for shoulder immobilization and follow-up with orthopedics in the outpatient setting.  No role for surgical intervention currently.  Patient did have severe pain at the beginning.  She was initially started on OxyContin which was changed over to oxycodone.  Pain is now better controlled.  She is also on Robaxin.  Bowel regimen.  Seen by therapy.  Skilled nursing facility is recommended for rehab.  Continue with sling.  Generalized weakness/debility/ambulatory dysfunction Patient reported frequent mechanical falls.  Apart from the clavicular fracture no other injuries noted on imaging studies.  Seen by PT and OT.  Skilled nursing facility is recommended for short-term rehab.  Right wrist pain No obvious injuries noted on the x-ray.  Pain control.  Chronic atrial fibrillation on warfarin Stable.  Continue warfarin as there is no plan for surgical intervention currently.  INR remains therapeutic.  Hypothyroidism TSH and free T4 normal.  Continue levothyroxine.  Essential hypertension Blood pressure is elevated due to pain.  Continue current regimen for now.  Monitor at the skilled nursing facility.  May need further dose adjustments.    Leukocytosis Thought to be reactive.  No evidence for any infection.  WBC has improved.  Chronic kidney disease stage III Renal function is stable.    Status post cardiac pacemaker Followed by Dr. Wynonia [name].  Echocardiogram as above.  Rhythm stable.  Can be pursued further as  an  outpatient.  Hyponatremia Stable.  Chronic depression and anxiety Continue home medications.  Overall stable.  Medically okay for discharge to skilled nursing facility for short-term rehab.   PERTINENT LABS:  The results of significant diagnostics from this hospitalization (including imaging, microbiology, ancillary and laboratory) are listed below for reference.      Labs: Basic Metabolic Panel: Recent Labs  Lab 11/20/17 0828 11/21/17 0501 11/22/17 0430 11/23/17 0606 11/23/17 1003  NA 138 138 133* 134*  --   K 3.9 4.8 3.9 4.2  --   CL 101 99 99 101  --   CO2 26 29 28 22   --   GLUCOSE 156* 106* 119* 94  --   BUN 35* 22 23 18   --   CREATININE 1.11* 1.00 0.92 0.84  --   CALCIUM 9.2 9.2 8.5* 8.7*  --   MG  --  2.0 1.8 QUANTITY NOT SUFFICIENT, UNABLE TO PERFORM TEST 1.7  PHOS  --   --  3.1 2.3*  --    Liver Function Tests: Recent Labs  Lab 11/20/17 0828 11/21/17 0501 11/22/17 0430 11/23/17 0606 11/23/17 1003  AST 26 21 16  QUANTITY NOT SUFFICIENT, UNABLE TO PERFORM TEST 17  ALT 21 22 16  QUANTITY NOT SUFFICIENT, UNABLE TO PERFORM TEST 15  ALKPHOS 77 69 62 60  --   BILITOT 1.0 1.0 1.2 QUANTITY NOT SUFFICIENT, UNABLE TO PERFORM TEST 0.9  PROT 7.3 7.1 6.4* 5.8*  --   ALBUMIN 4.0 3.8 3.3* 2.8*  --    CBC: Recent Labs  Lab 11/20/17 0828 11/21/17 0501 11/21/17 1733 11/22/17 0839 11/23/17 0606  WBC 12.0* 11.7* 13.8* 13.6* 12.0*  NEUTROABS 10.2*  --  11.1* 10.3* 8.6*  HGB 13.3 13.7 12.7 13.5 13.2  HCT 41.1 43.0 39.5 41.5 40.6  MCV 86.3 86.9 86.6 86.1 85.5  PLT 260 257 231 226 226     IMAGING STUDIES Dg Chest 2 View  Result Date: 11/10/2017 CLINICAL DATA:  Shortness of breath for 1 month EXAM: CHEST - 2 VIEW COMPARISON:  05/27/2010 FINDINGS: Cardiac shadow is stable but diffusely enlarged. Aortic calcifications are again seen. Pacing device is again noted. Chronic interstitial changes are seen without focal confluent infiltrate or effusion. Mild  hyperinflation is seen. No acute bony abnormality is noted. IMPRESSION: COPD and chronic interstitial changes.  No acute abnormality noted. Electronically Signed   By: Inez Catalina M.D.   On: 11/10/2017 16:11   Dg Wrist Complete Right  Result Date: 11/21/2017 CLINICAL DATA:  Wrist pain after fall EXAM: RIGHT WRIST - COMPLETE 3+ VIEW COMPARISON:  None. FINDINGS: No fracture or malalignment. Arthritis at the distal radial carpal interval. Cartilaginous calcifications. Moderate-to-marked arthritis at the first [facility] joint. IMPRESSION: 1. No acute osseous abnormality 2. Moderate-to-marked arthritis first [facility] joint. 3. Chondrocalcinosis Electronically Signed   By: Donavan Foil M.D.   On: 11/21/2017 16:45   Ct Head Wo Contrast  Result Date: 11/20/2017 CLINICAL DATA:  Fall, head and neck injury, right clavicle fracture EXAM: CT HEAD WITHOUT CONTRAST CT CERVICAL SPINE WITHOUT CONTRAST TECHNIQUE: Multidetector CT imaging of the head and cervical spine was performed following the standard protocol without intravenous contrast. Multiplanar CT image reconstructions of the cervical spine were also generated. COMPARISON:  None. FINDINGS: CT HEAD FINDINGS Brain: Brain atrophy noted with chronic white matter microvascular ischemic changes throughout both cerebral hemispheres. No acute intracranial hemorrhage, mass lesion, midline shift, or shin, herniation, or extra-axial fluid collection. Normal gray-white matter differentiation. Cisterns are patent. Cerebellar  atrophy as well. Vascular: Cranial atherosclerosis noted.  No hyperdense vessel. Skull: Normal. Negative for fracture or focal lesion. Sinuses/Orbits: No acute finding. Other: None. CT CERVICAL SPINE FINDINGS Alignment: Normal. Skull base and vertebrae: Intact cervical spine. No cervical spine fracture, subluxation, or dislocation. Multilevel degenerative spondylosis and facet arthropathy of the cervical spine. Soft tissues and spinal canal: No prevertebral fluid or  swelling. No visible canal hematoma. Disc levels: Multilevel degenerative spondylosis with disc space narrowing, sclerosis and endplate osteophytes. Degenerative changes of the C1-2 articulation. Preserved vertebral body heights. Upper chest: Acute minimally comminuted displaced right mid clavicle fracture noted with surrounding mild hematoma. Degenerative changes of the shoulders. Clear lung apices. Other: Carotid atherosclerosis noted. IMPRESSION: Brain atrophy and chronic white matter microvascular ischemic changes. No acute intracranial abnormality by noncontrast CT. Cervical degenerative spondylosis and facet arthropathy without acute osseous finding or fracture Acute displaced right mid clavicle fracture Electronically Signed   By: Jerilynn Mages.  Shick M.D.   On: 11/20/2017 10:38   Ct Chest W Contrast  Result Date: 11/20/2017 CLINICAL DATA:  Status post fall this morning. Right clavicle pain. Initial encounter. EXAM: CT CHEST WITH CONTRAST TECHNIQUE: Multidetector CT imaging of the chest was performed during intravenous contrast administration. CONTRAST:  100 mL ISOVUE-370 IOPAMIDOL (ISOVUE-370) INJECTION 76% COMPARISON:  Plain film of the chest today. FINDINGS: Cardiovascular: There is marked cardiomegaly. Calcific aortic and coronary atherosclerosis is noted. No pericardial effusion. No aortic aneurysm. Mediastinum/Nodes: No enlarged mediastinal, hilar, or axillary lymph nodes. Thyroid gland, trachea, and esophagus demonstrate no significant findings. Lungs/Pleura: No pleural effusion. No pneumothorax. The lungs are clear. Upper Abdomen: Contrast refluxes into a dilated inferior vena cava in the patent veins consistent with right heart insufficiency. Musculoskeletal: The patient has an acute segmental fracture of the diaphysis of the right clavicle. The medial fracture line is angulated approximately 90 degrees with the segmental fracture fragment oriented almost directly posteriorly. More lateral fracture line is  also oriented approximately 90 degrees. No other acute fracture is seen. There is exaggeration of the normal thoracic kyphosis. No lytic or sclerotic lesion is identified. IMPRESSION: Acute segmental right clavicle fracture as described above. No other acute abnormality. Marked cardiomegaly. Reflux of contrast into the inferior vena cava and hepatic veins is compatible with right heart insufficiency. Calcific coronary artery disease. Aortic Atherosclerosis (ICD10-I70.0). Electronically Signed   By: Inge Rise M.D.   On: 11/20/2017 10:33   Ct Cervical Spine Wo Contrast  Result Date: 11/20/2017 CLINICAL DATA:  Fall, head and neck injury, right clavicle fracture EXAM: CT HEAD WITHOUT CONTRAST CT CERVICAL SPINE WITHOUT CONTRAST TECHNIQUE: Multidetector CT imaging of the head and cervical spine was performed following the standard protocol without intravenous contrast. Multiplanar CT image reconstructions of the cervical spine were also generated. COMPARISON:  None. FINDINGS: CT HEAD FINDINGS Brain: Brain atrophy noted with chronic white matter microvascular ischemic changes throughout both cerebral hemispheres. No acute intracranial hemorrhage, mass lesion, midline shift, or shin, herniation, or extra-axial fluid collection. Normal gray-white matter differentiation. Cisterns are patent. Cerebellar atrophy as well. Vascular: Cranial atherosclerosis noted.  No hyperdense vessel. Skull: Normal. Negative for fracture or focal lesion. Sinuses/Orbits: No acute finding. Other: None. CT CERVICAL SPINE FINDINGS Alignment: Normal. Skull base and vertebrae: Intact cervical spine. No cervical spine fracture, subluxation, or dislocation. Multilevel degenerative spondylosis and facet arthropathy of the cervical spine. Soft tissues and spinal canal: No prevertebral fluid or swelling. No visible canal hematoma. Disc levels: Multilevel degenerative spondylosis with disc space narrowing, sclerosis and endplate osteophytes.  Degenerative changes of the C1-2 articulation. Preserved vertebral body heights. Upper chest: Acute minimally comminuted displaced right mid clavicle fracture noted with surrounding mild hematoma. Degenerative changes of the shoulders. Clear lung apices. Other: Carotid atherosclerosis noted. IMPRESSION: Brain atrophy and chronic white matter microvascular ischemic changes. No acute intracranial abnormality by noncontrast CT. Cervical degenerative spondylosis and facet arthropathy without acute osseous finding or fracture Acute displaced right mid clavicle fracture Electronically Signed   By: Jerilynn Mages.  Shick M.D.   On: 11/20/2017 10:38   Dg Pelvis Portable  Result Date: 11/20/2017 CLINICAL DATA:  Fall. EXAM: PORTABLE PELVIS 1-2 VIEWS COMPARISON:  CT abdomen pelvis dated September 19, 2017. FINDINGS: No acute fracture or dislocation. The pubic symphysis and sacroiliac joints are intact. Unchanged moderate right hip joint space narrowing with large subchondral cysts and large femoral head osteophytes. Unchanged mild left hip joint space narrowing. Diffuse osteopenia. Soft tissues are unremarkable. IMPRESSION: 1. No acute osseous abnormality. 2. 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 Electronically Signed   By: Titus Dubin M.D.   On: 11/20/2017 09:04   Dg Chest Portable 1 View  Result Date: 11/20/2017 CLINICAL DATA:  Fall.  Right clavicle pain. EXAM: PORTABLE CHEST 1 VIEW COMPARISON:  Chest x-ray dated November 10, 2017. FINDINGS: Unchanged single lead right chest wall pacemaker. Stable cardiomegaly. Atherosclerotic calcification of the aortic arch. Normal pulmonary vascularity. The lungs remain mildly hyperinflated. Chronic interstitial changes are similar to prior study. No focal consolidation, pleural effusion, or pneumothorax. Acute fracture of the right mid to distal clavicle with almost 2 cm of overlapping fragments. IMPRESSION: 1. Acute right mid to distal clavicle fracture with 2 cm of overlapping  fragments. 2. COPD and chronic interstitial changes. No active cardiopulmonary disease. Electronically Signed   By: Titus Dubin M.D.   On: 11/20/2017 09:01    DISCHARGE EXAMINATION: Vitals:   11/23/17 1533 11/23/17 2337 11/24/17 0527 11/24/17 0528  BP: 126/68 (!) 151/69  (!) 177/86  Pulse: (!) 58 67  (!) 59  Resp: 14 19  16   Temp: 97.6 F (36.4 C) 98 F (36.7 C)  98.5 F (36.9 C)  TempSrc: Oral Oral  Oral  SpO2: 94% 94%  96%  Weight:   74.8 kg   Height:       General appearance: alert, cooperative, appears stated age and no distress Resp: clear to auscultation bilaterally Cardio: regular rate and rhythm, S1, S2 normal, no murmur, click, rub or gallop GI: soft, non-tender; bowel sounds normal; no masses,  no organomegaly  DISPOSITION: SNF  Discharge Instructions    Call MD for:  difficulty breathing, headache or visual disturbances   Complete by:  As directed    Call MD for:  extreme fatigue   Complete by:  As directed    Call MD for:  persistant dizziness or light-headedness   Complete by:  As directed    Call MD for:  persistant nausea and vomiting   Complete by:  As directed    Call MD for:  severe uncontrolled pain   Complete by:  As directed    Call MD for:  temperature >100.4   Complete by:  As directed    Discharge instructions   Complete by:  As directed    See instructions on the discharge summary  You were cared for by a hospitalist during your hospital stay. If you have any questions about your discharge medications or the care you received while you were in the hospital after you are discharged, you  can call the unit and asked to speak with the hospitalist on call if the hospitalist that took care of you is not available. Once you are discharged, your primary care physician will handle any further medical issues. Please note that NO REFILLS for any discharge medications will be authorized once you are discharged, as it is imperative that you return to your  primary care physician (or establish a relationship with a primary care physician if you do not have one) for your aftercare needs so that they can reassess your need for medications and monitor your lab values. If you do not have a primary care physician, you can call [phone] for a physician referral.   Increase activity slowly   Complete by:  As directed          Allergies as of 11/24/2017      Reactions   Penicillins Hives, Rash   Has patient had a PCN reaction causing immediate rash, facial/tongue/throat swelling, SOB or lightheadedness with hypotension: unkn Has patient had a PCN reaction causing severe rash involving mucus membranes or skin necrosis: unkn Has patient had a PCN reaction that required hospitalization: unkn Has patient had a PCN reaction occurring within the last 10 years: unkn If all of the above answers are "NO", then may proceed with Cephalosporin use.      Medication List    STOP taking these medications   docusate sodium 100 MG capsule Commonly known as:  COLACE     TAKE these medications   acetaminophen 500 MG tablet Commonly known as:  TYLENOL Take 1,000 mg every 8 (eight) hours as needed by mouth for mild pain or moderate pain.   amLODipine 5 MG tablet Commonly known as:  NORVASC Take 5 mg by mouth daily.   BEPREVE OP Place 1 drop 2 (two) times daily as needed into both eyes (eye allergies).   celecoxib 200 MG capsule Commonly known as:  CELEBREX Take 200 mg at bedtime by mouth.   cholecalciferol 1000 units tablet Commonly known as:  VITAMIN D Take 1,000 Units daily by mouth.   digoxin 0.125 MG tablet Commonly known as:  LANOXIN Take 0.125 mg every morning by mouth.   diphenhydrAMINE 25 MG tablet Commonly known as:  BENADRYL Take 50 mg by mouth every 6 (six) hours as needed (for allergic reaction).   fluticasone 50 MCG/ACT nasal spray Commonly known as:  FLONASE Place 1 spray daily as needed into both nostrils for allergies or  rhinitis.   levothyroxine 125 MCG tablet Commonly known as:  SYNTHROID, LEVOTHROID Take 125 mcg at bedtime by mouth.   meclizine 25 MG tablet Commonly known as:  ANTIVERT Take 25 mg by mouth daily as needed for dizziness or nausea.   methocarbamol 500 MG tablet Commonly known as:  ROBAXIN Take 1 tablet (500 mg total) by mouth every 8 (eight) hours as needed for muscle spasms.   oxyCODONE-acetaminophen 5-325 MG tablet Commonly known as:  PERCOCET/ROXICET Take 1-2 tablets by mouth every 4 (four) hours as needed for severe pain.   polyethylene glycol packet Commonly known as:  MIRALAX / GLYCOLAX Take 17 g by mouth daily.   senna-docusate 8.6-50 MG tablet Commonly known as:  Senokot-S Take 1 tablet by mouth 2 (two) times daily.   sertraline 50 MG tablet Commonly known as:  ZOLOFT Take 25 mg by mouth daily.   vitamin B-12 1000 MCG tablet Commonly known as:  CYANOCOBALAMIN Take 1,000 mcg daily by mouth.   warfarin 4 MG tablet  Commonly known as:  COUMADIN Take 4 mg at bedtime by mouth.         Contact information for follow-up providers    Justice Britain, MD. Schedule an appointment as soon as possible for a visit in 1 week(s).   Specialty:  Orthopedic Surgery Why:  please call his office to schedule appointment Contact information: [address] [place] [address] [phone]            Contact information for after-discharge care    Destination    [facility] Preferred SNF .   Service:  Skilled Nursing Contact information: [place] [phone]                  TOTAL DISCHARGE TIME: 81 minutes  Bonnielee Haff  [facility] Pager [phone]  11/24/2017, 11:33 AM

## 2017-11-24 NOTE — Social Work (Signed)
CSW acknowledging discharge summary for SNF at [facility], liaison Levada Dy aware also.   Alexander Mt, [place] Work [phone]

## 2017-11-24 NOTE — Consult Note (Signed)
[facility] CM Primary Care Navigator  11/24/2017  [name] [birth year] [id number]   Met with patient, daughter (from [place]) and son [name]) at the bedside to identify possible discharge needs. Patientstatesthat shehada "fall and broke my collar bone" that resulted to this admission. (acute right clavicular fracture requiring outpatient follow-up, generalized weakness and debility)   Patient endorsesDr.Richard Reynaldo Minium with [facility] primary care provider.    [facility] [place] [facility].   Patientreportsmanaginghermedications at home using"pill box" system filledonce a week.  Patient states thatshe had been driving prior to admission but son (lives across her) will be able to provide transportation to her doctors' appointments after discharge.  Patientlives alone at home. Her children acknowledge the need for assistance once she gets back home and when her house (which is being built) is completed. Daughter mentioned having contact information for [facility] and [facility] when needed. For the meantime, patient's son will be providing assistance for patient as needed.  Anticipated discharge plan isskilled nursing facility (SNF- in process) for rehabilitation per therapy recommendation.  Patient and children voiced understanding to call primary care provider's office once patient gets back home,for a post discharge follow-up appointment within a1- 2 weeksor sooner if needs arise.Patient letter (with PCP's contact number) wasprovided as a reminder.  Discussed with patientand children regarding [facility] CM services available for health managementand resourcesat homeand had indicated interest for it.  Encouraged patient and children to talk with primary care provider on her next visit after coming home, for further needs and assistance in managing  her health needs at home.  Patient and children voicedunderstandingto seekreferralfrom primary care provider to [facility] care management ifdeemed necessary and appropriatefor services in thenearfuture- oncepatientreturnsback home.  [facility] care management information was provided for future needsthatpatientmay have. Primary care provider's office is listed as providing transition of care (TOC).  Patient's RN was notified of patient's need for pain medication and was brought in at this time.    For additional questions please contact:  Edwena Felty A.  Grosser, BSN, RN-BC [facility] PRIMARY CARE Navigator Cell: [phone]

## 2017-11-24 NOTE — Social Work (Signed)
Clinical Social Worker facilitated patient discharge including contacting patient family and facility to confirm patient discharge plans.  Clinical information faxed to facility and family agreeable with plan.  CSW arranged ambulance transport via [facility] to [facility] RN to call [phone] with report prior to discharge.  Clinical Social Worker will sign off for now as social work intervention is no longer needed. Please consult [place] again if new need arises.  Alexander Mt, [place] Social Worker

## 2017-11-26 DIAGNOSIS — E039 Hypothyroidism, unspecified: Secondary | ICD-10-CM | POA: Diagnosis not present

## 2017-11-26 DIAGNOSIS — N183 Chronic kidney disease, stage 3 (moderate): Secondary | ICD-10-CM | POA: Diagnosis not present

## 2017-11-26 DIAGNOSIS — S42009A Fracture of unspecified part of unspecified clavicle, initial encounter for closed fracture: Secondary | ICD-10-CM | POA: Diagnosis not present

## 2017-11-26 DIAGNOSIS — I10 Essential (primary) hypertension: Secondary | ICD-10-CM | POA: Diagnosis not present

## 2017-11-26 DIAGNOSIS — I4891 Unspecified atrial fibrillation: Secondary | ICD-10-CM | POA: Diagnosis not present

## 2017-12-02 DIAGNOSIS — S42001A Fracture of unspecified part of right clavicle, initial encounter for closed fracture: Secondary | ICD-10-CM | POA: Diagnosis not present

## 2017-12-08 ENCOUNTER — Other Ambulatory Visit: Payer: Self-pay | Admitting: *Deleted

## 2017-12-08 NOTE — Patient Outreach (Signed)
[facility]) Care Management  12/08/2017  [name] April 24, 1930 [id number]   Attended discharge planning meeting at [facility]. [facility] UM Members [name] and Jari Pigg present. Patient discussed and discharge plan noted. Patient's home is being remodeled to accommodate her needs, so patient plans to stay at [place] center under custodial care until home is complete. Patient already has [facility] personal caregivers in place when does discharge to home.   No [place] Management services needed at this time.   Rutherford Limerick RN, BSN [place] Acute Care Coordinator [phone]) Business Mobile [phone]) Toll free office

## 2017-12-14 DIAGNOSIS — R2681 Unsteadiness on feet: Secondary | ICD-10-CM | POA: Diagnosis not present

## 2017-12-14 DIAGNOSIS — M25511 Pain in right shoulder: Secondary | ICD-10-CM | POA: Diagnosis not present

## 2017-12-14 DIAGNOSIS — R278 Other lack of coordination: Secondary | ICD-10-CM | POA: Diagnosis not present

## 2017-12-14 DIAGNOSIS — M6281 Muscle weakness (generalized): Secondary | ICD-10-CM | POA: Diagnosis not present

## 2017-12-14 DIAGNOSIS — S42001D Fracture of unspecified part of right clavicle, subsequent encounter for fracture with routine healing: Secondary | ICD-10-CM | POA: Diagnosis not present

## 2017-12-14 DIAGNOSIS — G3184 Mild cognitive impairment, so stated: Secondary | ICD-10-CM | POA: Diagnosis not present

## 2017-12-15 DIAGNOSIS — R278 Other lack of coordination: Secondary | ICD-10-CM | POA: Diagnosis not present

## 2017-12-15 DIAGNOSIS — M6281 Muscle weakness (generalized): Secondary | ICD-10-CM | POA: Diagnosis not present

## 2017-12-15 DIAGNOSIS — S42001D Fracture of unspecified part of right clavicle, subsequent encounter for fracture with routine healing: Secondary | ICD-10-CM | POA: Diagnosis not present

## 2017-12-15 DIAGNOSIS — R2681 Unsteadiness on feet: Secondary | ICD-10-CM | POA: Diagnosis not present

## 2017-12-15 DIAGNOSIS — M25511 Pain in right shoulder: Secondary | ICD-10-CM | POA: Diagnosis not present

## 2017-12-16 DIAGNOSIS — S42001D Fracture of unspecified part of right clavicle, subsequent encounter for fracture with routine healing: Secondary | ICD-10-CM | POA: Diagnosis not present

## 2017-12-16 DIAGNOSIS — R278 Other lack of coordination: Secondary | ICD-10-CM | POA: Diagnosis not present

## 2017-12-16 DIAGNOSIS — M6281 Muscle weakness (generalized): Secondary | ICD-10-CM | POA: Diagnosis not present

## 2017-12-16 DIAGNOSIS — R2681 Unsteadiness on feet: Secondary | ICD-10-CM | POA: Diagnosis not present

## 2017-12-16 DIAGNOSIS — M25511 Pain in right shoulder: Secondary | ICD-10-CM | POA: Diagnosis not present

## 2017-12-19 DIAGNOSIS — M25511 Pain in right shoulder: Secondary | ICD-10-CM | POA: Diagnosis not present

## 2017-12-19 DIAGNOSIS — S42001D Fracture of unspecified part of right clavicle, subsequent encounter for fracture with routine healing: Secondary | ICD-10-CM | POA: Diagnosis not present

## 2017-12-19 DIAGNOSIS — R2681 Unsteadiness on feet: Secondary | ICD-10-CM | POA: Diagnosis not present

## 2017-12-19 DIAGNOSIS — M6281 Muscle weakness (generalized): Secondary | ICD-10-CM | POA: Diagnosis not present

## 2017-12-19 DIAGNOSIS — R278 Other lack of coordination: Secondary | ICD-10-CM | POA: Diagnosis not present

## 2017-12-20 DIAGNOSIS — S42001D Fracture of unspecified part of right clavicle, subsequent encounter for fracture with routine healing: Secondary | ICD-10-CM | POA: Diagnosis not present

## 2017-12-20 DIAGNOSIS — M6281 Muscle weakness (generalized): Secondary | ICD-10-CM | POA: Diagnosis not present

## 2017-12-20 DIAGNOSIS — R278 Other lack of coordination: Secondary | ICD-10-CM | POA: Diagnosis not present

## 2017-12-21 DIAGNOSIS — S42001D Fracture of unspecified part of right clavicle, subsequent encounter for fracture with routine healing: Secondary | ICD-10-CM | POA: Diagnosis not present

## 2017-12-21 DIAGNOSIS — R278 Other lack of coordination: Secondary | ICD-10-CM | POA: Diagnosis not present

## 2017-12-21 DIAGNOSIS — M6281 Muscle weakness (generalized): Secondary | ICD-10-CM | POA: Diagnosis not present

## 2017-12-22 DIAGNOSIS — M6281 Muscle weakness (generalized): Secondary | ICD-10-CM | POA: Diagnosis not present

## 2017-12-22 DIAGNOSIS — S42001D Fracture of unspecified part of right clavicle, subsequent encounter for fracture with routine healing: Secondary | ICD-10-CM | POA: Diagnosis not present

## 2017-12-22 DIAGNOSIS — R278 Other lack of coordination: Secondary | ICD-10-CM | POA: Diagnosis not present

## 2017-12-23 DIAGNOSIS — S42001D Fracture of unspecified part of right clavicle, subsequent encounter for fracture with routine healing: Secondary | ICD-10-CM | POA: Diagnosis not present

## 2017-12-23 DIAGNOSIS — M6281 Muscle weakness (generalized): Secondary | ICD-10-CM | POA: Diagnosis not present

## 2017-12-23 DIAGNOSIS — R278 Other lack of coordination: Secondary | ICD-10-CM | POA: Diagnosis not present

## 2017-12-26 DIAGNOSIS — R278 Other lack of coordination: Secondary | ICD-10-CM | POA: Diagnosis not present

## 2017-12-26 DIAGNOSIS — S42001D Fracture of unspecified part of right clavicle, subsequent encounter for fracture with routine healing: Secondary | ICD-10-CM | POA: Diagnosis not present

## 2017-12-26 DIAGNOSIS — M6281 Muscle weakness (generalized): Secondary | ICD-10-CM | POA: Diagnosis not present

## 2017-12-27 DIAGNOSIS — S42001D Fracture of unspecified part of right clavicle, subsequent encounter for fracture with routine healing: Secondary | ICD-10-CM | POA: Diagnosis not present

## 2017-12-27 DIAGNOSIS — M6281 Muscle weakness (generalized): Secondary | ICD-10-CM | POA: Diagnosis not present

## 2017-12-27 DIAGNOSIS — R278 Other lack of coordination: Secondary | ICD-10-CM | POA: Diagnosis not present

## 2017-12-28 DIAGNOSIS — S42001A Fracture of unspecified part of right clavicle, initial encounter for closed fracture: Secondary | ICD-10-CM | POA: Diagnosis not present

## 2017-12-29 DIAGNOSIS — R278 Other lack of coordination: Secondary | ICD-10-CM | POA: Diagnosis not present

## 2017-12-29 DIAGNOSIS — S42001D Fracture of unspecified part of right clavicle, subsequent encounter for fracture with routine healing: Secondary | ICD-10-CM | POA: Diagnosis not present

## 2017-12-29 DIAGNOSIS — M6281 Muscle weakness (generalized): Secondary | ICD-10-CM | POA: Diagnosis not present

## 2018-01-03 DIAGNOSIS — G5601 Carpal tunnel syndrome, right upper limb: Secondary | ICD-10-CM | POA: Diagnosis not present

## 2018-01-03 DIAGNOSIS — S42001D Fracture of unspecified part of right clavicle, subsequent encounter for fracture with routine healing: Secondary | ICD-10-CM | POA: Diagnosis not present

## 2018-01-03 DIAGNOSIS — R278 Other lack of coordination: Secondary | ICD-10-CM | POA: Diagnosis not present

## 2018-01-03 DIAGNOSIS — M6281 Muscle weakness (generalized): Secondary | ICD-10-CM | POA: Diagnosis not present

## 2018-01-03 DIAGNOSIS — G5602 Carpal tunnel syndrome, left upper limb: Secondary | ICD-10-CM | POA: Diagnosis not present

## 2018-01-04 ENCOUNTER — Encounter: Payer: Self-pay | Admitting: Internal Medicine

## 2018-01-04 ENCOUNTER — Other Ambulatory Visit: Payer: Self-pay | Admitting: Internal Medicine

## 2018-01-04 ENCOUNTER — Ambulatory Visit (INDEPENDENT_AMBULATORY_CARE_PROVIDER_SITE_OTHER): Payer: Medicare Other | Admitting: Internal Medicine

## 2018-01-04 VITALS — BP 200/82 | HR 78 | Ht 61.0 in | Wt 142.0 lb

## 2018-01-04 DIAGNOSIS — G629 Polyneuropathy, unspecified: Secondary | ICD-10-CM | POA: Diagnosis not present

## 2018-01-04 DIAGNOSIS — M1712 Unilateral primary osteoarthritis, left knee: Secondary | ICD-10-CM | POA: Diagnosis not present

## 2018-01-04 DIAGNOSIS — E038 Other specified hypothyroidism: Secondary | ICD-10-CM | POA: Diagnosis not present

## 2018-01-04 DIAGNOSIS — R001 Bradycardia, unspecified: Secondary | ICD-10-CM

## 2018-01-04 DIAGNOSIS — I10 Essential (primary) hypertension: Secondary | ICD-10-CM | POA: Insufficient documentation

## 2018-01-04 DIAGNOSIS — I480 Paroxysmal atrial fibrillation: Secondary | ICD-10-CM | POA: Diagnosis not present

## 2018-01-04 DIAGNOSIS — I495 Sick sinus syndrome: Secondary | ICD-10-CM | POA: Diagnosis not present

## 2018-01-04 DIAGNOSIS — Z950 Presence of cardiac pacemaker: Secondary | ICD-10-CM | POA: Diagnosis not present

## 2018-01-04 DIAGNOSIS — I739 Peripheral vascular disease, unspecified: Secondary | ICD-10-CM | POA: Diagnosis not present

## 2018-01-04 DIAGNOSIS — H02403 Unspecified ptosis of bilateral eyelids: Secondary | ICD-10-CM | POA: Diagnosis not present

## 2018-01-04 DIAGNOSIS — Z7901 Long term (current) use of anticoagulants: Secondary | ICD-10-CM | POA: Diagnosis not present

## 2018-01-04 DIAGNOSIS — S42001D Fracture of unspecified part of right clavicle, subsequent encounter for fracture with routine healing: Secondary | ICD-10-CM | POA: Diagnosis not present

## 2018-01-04 DIAGNOSIS — I872 Venous insufficiency (chronic) (peripheral): Secondary | ICD-10-CM | POA: Diagnosis not present

## 2018-01-04 DIAGNOSIS — M6281 Muscle weakness (generalized): Secondary | ICD-10-CM | POA: Diagnosis not present

## 2018-01-04 DIAGNOSIS — E7849 Other hyperlipidemia: Secondary | ICD-10-CM | POA: Diagnosis not present

## 2018-01-04 DIAGNOSIS — R278 Other lack of coordination: Secondary | ICD-10-CM | POA: Diagnosis not present

## 2018-01-04 NOTE — Progress Notes (Signed)
.      Patient Care Team: Burnard Bunting, MD as PCP - General (Internal Medicine)   HPI  [name] is a 84 y.o. female SEEN IN followup the pacemaker originally implanted by Dr. Doreatha Lew 25 years ago.  She has a history of complete heart block paroxysmal atrial fibrillation.  She underwent device generator replacement 11/18.  Remote monitoring and auto threshold testing had driven her detected threshold rates to the upper limit.  In the context of "device dependence ", this was concerning.   she has a history of recurrent falls and apparently significant orthostatic hypotension.  Systolic blood pressures however been in the 200 range.      Past Medical History:  Diagnosis Date  . Arrhythmia    chronic atrial fib  . Atrial fibrillation, chronic   . Chronic anticoagulation   . Chronic atrial fibrillation 01/28/2017  . Chronic venous insufficiency 01/28/2017  . Clavicle fracture 11/20/2017  . Complication of anesthesia    " difficult waking "  . Long term current use of anticoagulant therapy 01/28/2017  . Osteoarthritis   . Presence of permanent cardiac pacemaker 01/28/2017   Original implant 1992 Lead Intermedics 431-04 Generator change 2001 and 2011.  Medtronic generator.   . SSS (sick sinus syndrome) ([place])   . Thyroid disease    hypothyroidism    Past Surgical History:  Procedure Laterality Date  . ABDOMINAL HYSTERECTOMY    . INSERT / REPLACE / REMOVE PACEMAKER     generator change 2011 medtronic sigma  . KNEE SURGERY    . PACEMAKER INSERTION    . PPM GENERATOR CHANGEOUT N/A 02/02/2017   Procedure: PPM GENERATOR CHANGEOUT;  Surgeon: Deboraha Sprang, MD;  Location: [place] [facility];  Service: Cardiovascular;  Laterality: N/A;  . REPLACEMENT TOTAL KNEE BILATERAL    . VARICOSE VEIN SURGERY      Current Outpatient Medications  Medication Sig Dispense Refill  . acetaminophen (TYLENOL) 500 MG tablet Take 1,000 mg every 8 (eight) hours as needed by  mouth for mild pain or moderate pain.    Marland Kitchen amLODipine (NORVASC) 5 MG tablet Take 5 mg by mouth daily.    . Bepotastine Besilate (BEPREVE OP) Place 1 drop 2 (two) times daily as needed into both eyes (eye allergies).    . celecoxib (CELEBREX) 200 MG capsule Take 200 mg at bedtime by mouth.     . cholecalciferol (VITAMIN D) 1000 units tablet Take 1,000 Units daily by mouth.    . digoxin (LANOXIN) 0.125 MG tablet Take 0.125 mg every morning by mouth.    . diphenhydrAMINE (BENADRYL) 25 MG tablet Take 50 mg by mouth every 6 (six) hours as needed (for allergic reaction).     . fluticasone (FLONASE) 50 MCG/ACT nasal spray Place 1 spray daily as needed into both nostrils for allergies or rhinitis.    Marland Kitchen levothyroxine (SYNTHROID, LEVOTHROID) 125 MCG tablet Take 125 mcg at bedtime by mouth.     . meclizine (ANTIVERT) 25 MG tablet Take 25 mg by mouth daily as needed for dizziness or nausea.     . methocarbamol (ROBAXIN) 500 MG tablet Take 1 tablet (500 mg total) by mouth every 8 (eight) hours as needed for muscle spasms. 30 tablet 0  . oxyCODONE-acetaminophen (PERCOCET/ROXICET) 5-325 MG tablet Take 1-2 tablets by mouth every 4 (four) hours as needed for severe pain. 20 tablet 0  . polyethylene glycol (MIRALAX / GLYCOLAX) packet Take 17 g by mouth daily. 14 each 0  .  senna-docusate (SENOKOT-S) 8.6-50 MG tablet Take 1 tablet by mouth 2 (two) times daily.    . sertraline (ZOLOFT) 50 MG tablet Take 25 mg by mouth daily.    . vitamin B-12 (CYANOCOBALAMIN) 1000 MCG tablet Take 1,000 mcg daily by mouth.    . warfarin (COUMADIN) 4 MG tablet Take 4 mg at bedtime by mouth.      No current facility-administered medications for this visit.     Allergies  Allergen Reactions  . Penicillins Hives and Rash    Has patient had a PCN reaction causing immediate rash, facial/tongue/throat swelling, SOB or lightheadedness with hypotension: unkn Has patient had a PCN reaction causing severe rash involving mucus membranes or  skin necrosis: unkn Has patient had a PCN reaction that required hospitalization: unkn Has patient had a PCN reaction occurring within the last 10 years: unkn If all of the above answers are "NO", then may proceed with Cephalosporin use.       Review of Systems negative except from HPI and PMH  Physical Exam BP (!) 200/82   Pulse 78   Ht 5\' 1"  (1.549 m)   Wt 142 lb (64.4 kg)   BMI 26.83 kg/m  Well developed and nourished in no acute distress HENT normal Neck supple with JVP-flat Clear Regular rate and rhythm, no murmurs or gallops Abd-soft with active BS No Clubbing cyanosis edema R arm limited Skin-warm and dry A & Oriented  Grossly normal sensory and motor function   ECG demonstrates atrial fibrillation with ventricular pacing  Assessment and  Plan  Intermittent complete heart block with intrinsic conduction today  Orthostatic hypotension  Hypertension-severe  Atrial fibrillation permanent  Left leg swelling  Pacemaker-Medtronic  The patient's device was interrogated.  T    Patient has intrinsic conduction.  This makes the concerns about lead failure much less urgent.  We will discontinue auto threshold testing  Her systolic hypertension is severe.  I have reached out to her PCP who was in the moment unavailable.  We will discuss strategies for managing systolic hypertension and severe orthostatic hypotension      Current medicines are reviewed at length with the patient today .  The patient does not  have concerns regarding medicines.

## 2018-01-04 NOTE — Addendum Note (Signed)
Addended by: Marlis Edelson C on: 01/04/2018 03:49 PM   Modules accepted: Orders

## 2018-01-04 NOTE — Patient Instructions (Addendum)
Medication Instructions:  Your physician recommends that you continue on your current medications as directed. Please refer to the Current Medication list given to you today.  Labwork: None ordered.  Testing/Procedures: None ordered.  Follow-Up: Your physician recommends that you schedule a follow-up appointment in:   One year with Dr Caryl Comes or Tommye Standard, PA    Any Other Special Instructions Will Be Listed Below (If Applicable).     If you need a refill on your cardiac medications before your next appointment, please call your pharmacy.

## 2018-01-05 DIAGNOSIS — S42001D Fracture of unspecified part of right clavicle, subsequent encounter for fracture with routine healing: Secondary | ICD-10-CM | POA: Diagnosis not present

## 2018-01-05 DIAGNOSIS — R278 Other lack of coordination: Secondary | ICD-10-CM | POA: Diagnosis not present

## 2018-01-05 DIAGNOSIS — M6281 Muscle weakness (generalized): Secondary | ICD-10-CM | POA: Diagnosis not present

## 2018-01-06 DIAGNOSIS — M6281 Muscle weakness (generalized): Secondary | ICD-10-CM | POA: Diagnosis not present

## 2018-01-06 DIAGNOSIS — S42001D Fracture of unspecified part of right clavicle, subsequent encounter for fracture with routine healing: Secondary | ICD-10-CM | POA: Diagnosis not present

## 2018-01-06 DIAGNOSIS — R278 Other lack of coordination: Secondary | ICD-10-CM | POA: Diagnosis not present

## 2018-01-09 DIAGNOSIS — M6281 Muscle weakness (generalized): Secondary | ICD-10-CM | POA: Diagnosis not present

## 2018-01-09 DIAGNOSIS — R278 Other lack of coordination: Secondary | ICD-10-CM | POA: Diagnosis not present

## 2018-01-09 DIAGNOSIS — S42001D Fracture of unspecified part of right clavicle, subsequent encounter for fracture with routine healing: Secondary | ICD-10-CM | POA: Diagnosis not present

## 2018-01-11 DIAGNOSIS — S42001D Fracture of unspecified part of right clavicle, subsequent encounter for fracture with routine healing: Secondary | ICD-10-CM | POA: Diagnosis not present

## 2018-01-11 DIAGNOSIS — R278 Other lack of coordination: Secondary | ICD-10-CM | POA: Diagnosis not present

## 2018-01-11 DIAGNOSIS — M6281 Muscle weakness (generalized): Secondary | ICD-10-CM | POA: Diagnosis not present

## 2018-01-11 DIAGNOSIS — M5430 Sciatica, unspecified side: Secondary | ICD-10-CM | POA: Diagnosis not present

## 2018-01-12 DIAGNOSIS — M6281 Muscle weakness (generalized): Secondary | ICD-10-CM | POA: Diagnosis not present

## 2018-01-12 DIAGNOSIS — S42001D Fracture of unspecified part of right clavicle, subsequent encounter for fracture with routine healing: Secondary | ICD-10-CM | POA: Diagnosis not present

## 2018-01-12 DIAGNOSIS — R278 Other lack of coordination: Secondary | ICD-10-CM | POA: Diagnosis not present

## 2018-01-23 ENCOUNTER — Telehealth: Payer: Self-pay

## 2018-01-23 ENCOUNTER — Ambulatory Visit (INDEPENDENT_AMBULATORY_CARE_PROVIDER_SITE_OTHER): Payer: Medicare Other | Admitting: *Deleted

## 2018-01-23 DIAGNOSIS — I495 Sick sinus syndrome: Secondary | ICD-10-CM

## 2018-01-23 DIAGNOSIS — Z7901 Long term (current) use of anticoagulants: Secondary | ICD-10-CM | POA: Diagnosis not present

## 2018-01-23 NOTE — Telephone Encounter (Signed)
Spoke with pt and reminded pt of remote transmission that is due today. Pt verbalized understanding.   

## 2018-01-25 DIAGNOSIS — M25511 Pain in right shoulder: Secondary | ICD-10-CM | POA: Diagnosis not present

## 2018-01-25 DIAGNOSIS — S42021D Displaced fracture of shaft of right clavicle, subsequent encounter for fracture with routine healing: Secondary | ICD-10-CM | POA: Diagnosis not present

## 2018-01-25 NOTE — Progress Notes (Signed)
Remote pacemaker transmission.   

## 2018-01-26 ENCOUNTER — Encounter: Payer: Self-pay | Admitting: Cardiology

## 2018-01-30 DIAGNOSIS — Z7901 Long term (current) use of anticoagulants: Secondary | ICD-10-CM | POA: Diagnosis not present

## 2018-01-30 DIAGNOSIS — Z79899 Other long term (current) drug therapy: Secondary | ICD-10-CM | POA: Diagnosis not present

## 2018-02-06 DIAGNOSIS — L603 Nail dystrophy: Secondary | ICD-10-CM | POA: Diagnosis not present

## 2018-02-06 DIAGNOSIS — M79673 Pain in unspecified foot: Secondary | ICD-10-CM | POA: Diagnosis not present

## 2018-02-06 DIAGNOSIS — N183 Chronic kidney disease, stage 3 (moderate): Secondary | ICD-10-CM | POA: Diagnosis not present

## 2018-02-06 DIAGNOSIS — R2681 Unsteadiness on feet: Secondary | ICD-10-CM | POA: Diagnosis not present

## 2018-02-06 DIAGNOSIS — L851 Acquired keratosis [keratoderma] palmaris et plantaris: Secondary | ICD-10-CM | POA: Diagnosis not present

## 2018-02-12 DIAGNOSIS — M25551 Pain in right hip: Secondary | ICD-10-CM | POA: Diagnosis not present

## 2018-02-14 DIAGNOSIS — R2681 Unsteadiness on feet: Secondary | ICD-10-CM | POA: Diagnosis not present

## 2018-02-14 DIAGNOSIS — M6281 Muscle weakness (generalized): Secondary | ICD-10-CM | POA: Diagnosis not present

## 2018-02-14 DIAGNOSIS — R278 Other lack of coordination: Secondary | ICD-10-CM | POA: Diagnosis not present

## 2018-02-15 DIAGNOSIS — R52 Pain, unspecified: Secondary | ICD-10-CM | POA: Diagnosis not present

## 2018-02-15 DIAGNOSIS — M545 Low back pain: Secondary | ICD-10-CM | POA: Diagnosis not present

## 2018-02-15 DIAGNOSIS — M6281 Muscle weakness (generalized): Secondary | ICD-10-CM | POA: Diagnosis not present

## 2018-02-15 DIAGNOSIS — R278 Other lack of coordination: Secondary | ICD-10-CM | POA: Diagnosis not present

## 2018-02-15 DIAGNOSIS — M546 Pain in thoracic spine: Secondary | ICD-10-CM | POA: Diagnosis not present

## 2018-02-15 DIAGNOSIS — M542 Cervicalgia: Secondary | ICD-10-CM | POA: Diagnosis not present

## 2018-02-15 DIAGNOSIS — R05 Cough: Secondary | ICD-10-CM | POA: Diagnosis not present

## 2018-02-15 DIAGNOSIS — R2681 Unsteadiness on feet: Secondary | ICD-10-CM | POA: Diagnosis not present

## 2018-02-16 DIAGNOSIS — Z79899 Other long term (current) drug therapy: Secondary | ICD-10-CM | POA: Diagnosis not present

## 2018-02-16 DIAGNOSIS — Z7901 Long term (current) use of anticoagulants: Secondary | ICD-10-CM | POA: Diagnosis not present

## 2018-02-17 DIAGNOSIS — M6281 Muscle weakness (generalized): Secondary | ICD-10-CM | POA: Diagnosis not present

## 2018-02-17 DIAGNOSIS — R278 Other lack of coordination: Secondary | ICD-10-CM | POA: Diagnosis not present

## 2018-02-17 DIAGNOSIS — R2681 Unsteadiness on feet: Secondary | ICD-10-CM | POA: Diagnosis not present

## 2018-02-20 DIAGNOSIS — R278 Other lack of coordination: Secondary | ICD-10-CM | POA: Diagnosis not present

## 2018-02-20 DIAGNOSIS — S42001D Fracture of unspecified part of right clavicle, subsequent encounter for fracture with routine healing: Secondary | ICD-10-CM | POA: Diagnosis not present

## 2018-02-20 DIAGNOSIS — R2681 Unsteadiness on feet: Secondary | ICD-10-CM | POA: Diagnosis not present

## 2018-02-20 DIAGNOSIS — M6281 Muscle weakness (generalized): Secondary | ICD-10-CM | POA: Diagnosis not present

## 2018-02-20 DIAGNOSIS — Z7901 Long term (current) use of anticoagulants: Secondary | ICD-10-CM | POA: Diagnosis not present

## 2018-02-23 DIAGNOSIS — R278 Other lack of coordination: Secondary | ICD-10-CM | POA: Diagnosis not present

## 2018-02-23 DIAGNOSIS — R2681 Unsteadiness on feet: Secondary | ICD-10-CM | POA: Diagnosis not present

## 2018-02-23 DIAGNOSIS — S42001D Fracture of unspecified part of right clavicle, subsequent encounter for fracture with routine healing: Secondary | ICD-10-CM | POA: Diagnosis not present

## 2018-02-23 DIAGNOSIS — M6281 Muscle weakness (generalized): Secondary | ICD-10-CM | POA: Diagnosis not present

## 2018-03-01 DIAGNOSIS — F3289 Other specified depressive episodes: Secondary | ICD-10-CM | POA: Diagnosis not present

## 2018-03-01 DIAGNOSIS — Z5181 Encounter for therapeutic drug level monitoring: Secondary | ICD-10-CM | POA: Diagnosis not present

## 2018-03-01 DIAGNOSIS — F329 Major depressive disorder, single episode, unspecified: Secondary | ICD-10-CM | POA: Diagnosis not present

## 2018-03-01 DIAGNOSIS — Z7901 Long term (current) use of anticoagulants: Secondary | ICD-10-CM | POA: Diagnosis not present

## 2018-03-01 DIAGNOSIS — S42001D Fracture of unspecified part of right clavicle, subsequent encounter for fracture with routine healing: Secondary | ICD-10-CM | POA: Diagnosis not present

## 2018-03-01 DIAGNOSIS — N183 Chronic kidney disease, stage 3 (moderate): Secondary | ICD-10-CM | POA: Diagnosis not present

## 2018-03-01 DIAGNOSIS — I129 Hypertensive chronic kidney disease with stage 1 through stage 4 chronic kidney disease, or unspecified chronic kidney disease: Secondary | ICD-10-CM | POA: Diagnosis not present

## 2018-03-01 DIAGNOSIS — W19XXXD Unspecified fall, subsequent encounter: Secondary | ICD-10-CM | POA: Diagnosis not present

## 2018-03-01 DIAGNOSIS — Z950 Presence of cardiac pacemaker: Secondary | ICD-10-CM | POA: Diagnosis not present

## 2018-03-01 DIAGNOSIS — I482 Chronic atrial fibrillation, unspecified: Secondary | ICD-10-CM | POA: Diagnosis not present

## 2018-03-01 DIAGNOSIS — M5441 Lumbago with sciatica, right side: Secondary | ICD-10-CM | POA: Diagnosis not present

## 2018-03-03 DIAGNOSIS — F329 Major depressive disorder, single episode, unspecified: Secondary | ICD-10-CM | POA: Diagnosis not present

## 2018-03-03 DIAGNOSIS — I129 Hypertensive chronic kidney disease with stage 1 through stage 4 chronic kidney disease, or unspecified chronic kidney disease: Secondary | ICD-10-CM | POA: Diagnosis not present

## 2018-03-03 DIAGNOSIS — I482 Chronic atrial fibrillation, unspecified: Secondary | ICD-10-CM | POA: Diagnosis not present

## 2018-03-03 DIAGNOSIS — N183 Chronic kidney disease, stage 3 (moderate): Secondary | ICD-10-CM | POA: Diagnosis not present

## 2018-03-03 DIAGNOSIS — S42001D Fracture of unspecified part of right clavicle, subsequent encounter for fracture with routine healing: Secondary | ICD-10-CM | POA: Diagnosis not present

## 2018-03-03 DIAGNOSIS — M5441 Lumbago with sciatica, right side: Secondary | ICD-10-CM | POA: Diagnosis not present

## 2018-03-04 DIAGNOSIS — M5441 Lumbago with sciatica, right side: Secondary | ICD-10-CM | POA: Diagnosis not present

## 2018-03-04 DIAGNOSIS — I482 Chronic atrial fibrillation, unspecified: Secondary | ICD-10-CM | POA: Diagnosis not present

## 2018-03-04 DIAGNOSIS — I129 Hypertensive chronic kidney disease with stage 1 through stage 4 chronic kidney disease, or unspecified chronic kidney disease: Secondary | ICD-10-CM | POA: Diagnosis not present

## 2018-03-04 DIAGNOSIS — F329 Major depressive disorder, single episode, unspecified: Secondary | ICD-10-CM | POA: Diagnosis not present

## 2018-03-04 DIAGNOSIS — S42001D Fracture of unspecified part of right clavicle, subsequent encounter for fracture with routine healing: Secondary | ICD-10-CM | POA: Diagnosis not present

## 2018-03-04 DIAGNOSIS — N183 Chronic kidney disease, stage 3 (moderate): Secondary | ICD-10-CM | POA: Diagnosis not present

## 2018-03-06 DIAGNOSIS — E7849 Other hyperlipidemia: Secondary | ICD-10-CM | POA: Diagnosis not present

## 2018-03-06 DIAGNOSIS — E038 Other specified hypothyroidism: Secondary | ICD-10-CM | POA: Diagnosis not present

## 2018-03-06 DIAGNOSIS — R2689 Other abnormalities of gait and mobility: Secondary | ICD-10-CM | POA: Diagnosis not present

## 2018-03-06 DIAGNOSIS — I10 Essential (primary) hypertension: Secondary | ICD-10-CM | POA: Diagnosis not present

## 2018-03-06 DIAGNOSIS — R05 Cough: Secondary | ICD-10-CM | POA: Diagnosis not present

## 2018-03-06 DIAGNOSIS — I739 Peripheral vascular disease, unspecified: Secondary | ICD-10-CM | POA: Diagnosis not present

## 2018-03-06 DIAGNOSIS — I480 Paroxysmal atrial fibrillation: Secondary | ICD-10-CM | POA: Diagnosis not present

## 2018-03-06 DIAGNOSIS — D692 Other nonthrombocytopenic purpura: Secondary | ICD-10-CM | POA: Diagnosis not present

## 2018-03-07 DIAGNOSIS — N183 Chronic kidney disease, stage 3 (moderate): Secondary | ICD-10-CM | POA: Diagnosis not present

## 2018-03-07 DIAGNOSIS — F329 Major depressive disorder, single episode, unspecified: Secondary | ICD-10-CM | POA: Diagnosis not present

## 2018-03-07 DIAGNOSIS — S42001D Fracture of unspecified part of right clavicle, subsequent encounter for fracture with routine healing: Secondary | ICD-10-CM | POA: Diagnosis not present

## 2018-03-07 DIAGNOSIS — M5441 Lumbago with sciatica, right side: Secondary | ICD-10-CM | POA: Diagnosis not present

## 2018-03-07 DIAGNOSIS — I482 Chronic atrial fibrillation, unspecified: Secondary | ICD-10-CM | POA: Diagnosis not present

## 2018-03-07 DIAGNOSIS — I129 Hypertensive chronic kidney disease with stage 1 through stage 4 chronic kidney disease, or unspecified chronic kidney disease: Secondary | ICD-10-CM | POA: Diagnosis not present

## 2018-03-08 DIAGNOSIS — I129 Hypertensive chronic kidney disease with stage 1 through stage 4 chronic kidney disease, or unspecified chronic kidney disease: Secondary | ICD-10-CM | POA: Diagnosis not present

## 2018-03-08 DIAGNOSIS — I482 Chronic atrial fibrillation, unspecified: Secondary | ICD-10-CM | POA: Diagnosis not present

## 2018-03-08 DIAGNOSIS — N183 Chronic kidney disease, stage 3 (moderate): Secondary | ICD-10-CM | POA: Diagnosis not present

## 2018-03-08 DIAGNOSIS — M5441 Lumbago with sciatica, right side: Secondary | ICD-10-CM | POA: Diagnosis not present

## 2018-03-08 DIAGNOSIS — S42001D Fracture of unspecified part of right clavicle, subsequent encounter for fracture with routine healing: Secondary | ICD-10-CM | POA: Diagnosis not present

## 2018-03-08 DIAGNOSIS — F329 Major depressive disorder, single episode, unspecified: Secondary | ICD-10-CM | POA: Diagnosis not present

## 2018-03-09 ENCOUNTER — Ambulatory Visit (INDEPENDENT_AMBULATORY_CARE_PROVIDER_SITE_OTHER): Payer: Medicare Other | Admitting: Cardiovascular Disease

## 2018-03-09 ENCOUNTER — Telehealth: Payer: Self-pay | Admitting: Cardiology

## 2018-03-09 ENCOUNTER — Telehealth: Payer: Self-pay | Admitting: Obstetrics and Gynecology

## 2018-03-09 DIAGNOSIS — I482 Chronic atrial fibrillation, unspecified: Secondary | ICD-10-CM | POA: Diagnosis not present

## 2018-03-09 DIAGNOSIS — I129 Hypertensive chronic kidney disease with stage 1 through stage 4 chronic kidney disease, or unspecified chronic kidney disease: Secondary | ICD-10-CM | POA: Diagnosis not present

## 2018-03-09 DIAGNOSIS — Z5181 Encounter for therapeutic drug level monitoring: Secondary | ICD-10-CM | POA: Diagnosis not present

## 2018-03-09 DIAGNOSIS — F329 Major depressive disorder, single episode, unspecified: Secondary | ICD-10-CM | POA: Diagnosis not present

## 2018-03-09 DIAGNOSIS — S42001D Fracture of unspecified part of right clavicle, subsequent encounter for fracture with routine healing: Secondary | ICD-10-CM | POA: Diagnosis not present

## 2018-03-09 DIAGNOSIS — N183 Chronic kidney disease, stage 3 (moderate): Secondary | ICD-10-CM | POA: Diagnosis not present

## 2018-03-09 DIAGNOSIS — M5441 Lumbago with sciatica, right side: Secondary | ICD-10-CM | POA: Diagnosis not present

## 2018-03-09 LAB — POCT INR: INR: 2.1 (ref 2.0–3.0)

## 2018-03-09 NOTE — Telephone Encounter (Signed)
Called and left a message for patient to call back to schedule a new patient doctor referral appointment with our office to see any provider for: "contraceptive pessary surveillance."

## 2018-03-09 NOTE — Telephone Encounter (Signed)
Lenell Antu [phone]) is a [facility] nurse who was visiting our patient, [name] (Dec 19, 2030).  This patient has been in [facility] following a fall at her home.  She is now at her home and [name] wants directions/orders for pt's protime.  Pt herself admits that she is very confused and does not know when her protime was last done.  Pt's rov with Dr Wynonia Lawman is not scheduled until February 20th.  She has had a procedure by Dr Caryl Comes in the past.  Can you check with the powers that be and give me some direction to give to pt and Lenell Antu, please?

## 2018-03-10 LAB — CUP PACEART REMOTE DEVICE CHECK
Implantable Lead Implant Date: 20181114
Implantable Pulse Generator Implant Date: 20181114
Lead Channel Impedance Value: 0 Ohm
Lead Channel Impedance Value: 182 Ohm
Lead Channel Setting Pacing Amplitude: 2.5 V
Lead Channel Setting Pacing Pulse Width: 1 ms
Lead Channel Setting Sensing Sensitivity: 2 mV
MDC IDC LEAD LOCATION: 753860
MDC IDC MSMT BATTERY IMPEDANCE: 256 Ohm
MDC IDC MSMT BATTERY REMAINING LONGEVITY: 55 mo
MDC IDC MSMT BATTERY VOLTAGE: 2.77 V
MDC IDC SESS DTM: 20191104185556
MDC IDC STAT BRADY RV PERCENT PACED: 98 %

## 2018-03-13 DIAGNOSIS — S42001D Fracture of unspecified part of right clavicle, subsequent encounter for fracture with routine healing: Secondary | ICD-10-CM | POA: Diagnosis not present

## 2018-03-13 DIAGNOSIS — F329 Major depressive disorder, single episode, unspecified: Secondary | ICD-10-CM | POA: Diagnosis not present

## 2018-03-13 DIAGNOSIS — I482 Chronic atrial fibrillation, unspecified: Secondary | ICD-10-CM | POA: Diagnosis not present

## 2018-03-13 DIAGNOSIS — I129 Hypertensive chronic kidney disease with stage 1 through stage 4 chronic kidney disease, or unspecified chronic kidney disease: Secondary | ICD-10-CM | POA: Diagnosis not present

## 2018-03-13 DIAGNOSIS — N183 Chronic kidney disease, stage 3 (moderate): Secondary | ICD-10-CM | POA: Diagnosis not present

## 2018-03-13 DIAGNOSIS — M5441 Lumbago with sciatica, right side: Secondary | ICD-10-CM | POA: Diagnosis not present

## 2018-03-16 ENCOUNTER — Emergency Department (HOSPITAL_COMMUNITY)
Admission: EM | Admit: 2018-03-16 | Discharge: 2018-03-16 | Disposition: A | Discharge status: Home / Self Care | Payer: Medicare Other | Attending: Emergency Medicine | Admitting: Emergency Medicine

## 2018-03-16 ENCOUNTER — Encounter (HOSPITAL_COMMUNITY): Payer: Self-pay | Admitting: Emergency Medicine

## 2018-03-16 ENCOUNTER — Ambulatory Visit (INDEPENDENT_AMBULATORY_CARE_PROVIDER_SITE_OTHER): Payer: Medicare Other | Admitting: Internal Medicine

## 2018-03-16 ENCOUNTER — Emergency Department (HOSPITAL_COMMUNITY): Payer: Medicare Other

## 2018-03-16 DIAGNOSIS — S42001D Fracture of unspecified part of right clavicle, subsequent encounter for fracture with routine healing: Secondary | ICD-10-CM | POA: Diagnosis not present

## 2018-03-16 DIAGNOSIS — Z950 Presence of cardiac pacemaker: Secondary | ICD-10-CM | POA: Diagnosis not present

## 2018-03-16 DIAGNOSIS — R52 Pain, unspecified: Secondary | ICD-10-CM | POA: Diagnosis not present

## 2018-03-16 DIAGNOSIS — I10 Essential (primary) hypertension: Secondary | ICD-10-CM | POA: Diagnosis not present

## 2018-03-16 DIAGNOSIS — I482 Chronic atrial fibrillation, unspecified: Secondary | ICD-10-CM | POA: Diagnosis not present

## 2018-03-16 DIAGNOSIS — M5441 Lumbago with sciatica, right side: Secondary | ICD-10-CM | POA: Diagnosis not present

## 2018-03-16 DIAGNOSIS — E039 Hypothyroidism, unspecified: Secondary | ICD-10-CM | POA: Diagnosis not present

## 2018-03-16 DIAGNOSIS — Z87891 Personal history of nicotine dependence: Secondary | ICD-10-CM | POA: Diagnosis not present

## 2018-03-16 DIAGNOSIS — G4489 Other headache syndrome: Secondary | ICD-10-CM | POA: Diagnosis not present

## 2018-03-16 DIAGNOSIS — Z7901 Long term (current) use of anticoagulants: Secondary | ICD-10-CM | POA: Diagnosis not present

## 2018-03-16 DIAGNOSIS — Z79899 Other long term (current) drug therapy: Secondary | ICD-10-CM | POA: Diagnosis not present

## 2018-03-16 DIAGNOSIS — R51 Headache: Secondary | ICD-10-CM

## 2018-03-16 DIAGNOSIS — Z96653 Presence of artificial knee joint, bilateral: Secondary | ICD-10-CM | POA: Insufficient documentation

## 2018-03-16 DIAGNOSIS — R519 Headache, unspecified: Secondary | ICD-10-CM

## 2018-03-16 DIAGNOSIS — N183 Chronic kidney disease, stage 3 (moderate): Secondary | ICD-10-CM | POA: Diagnosis not present

## 2018-03-16 DIAGNOSIS — I129 Hypertensive chronic kidney disease with stage 1 through stage 4 chronic kidney disease, or unspecified chronic kidney disease: Secondary | ICD-10-CM | POA: Diagnosis not present

## 2018-03-16 DIAGNOSIS — F329 Major depressive disorder, single episode, unspecified: Secondary | ICD-10-CM | POA: Diagnosis not present

## 2018-03-16 DIAGNOSIS — Z5181 Encounter for therapeutic drug level monitoring: Secondary | ICD-10-CM

## 2018-03-16 LAB — BASIC METABOLIC PANEL
ANION GAP: 9 (ref 5–15)
BUN: 19 mg/dL (ref 8–23)
CALCIUM: 8.9 mg/dL (ref 8.9–10.3)
CO2: 25 mmol/L (ref 22–32)
Chloride: 102 mmol/L (ref 98–111)
Creatinine, Ser: 0.83 mg/dL (ref 0.44–1.00)
GFR calc Af Amer: >60 mL/min (ref >60)
GFR calc non Af Amer: >60 mL/min (ref >60)
Glucose, Bld: 89 mg/dL (ref 70–99)
Potassium: 4.4 mmol/L (ref 3.5–5.1)
Sodium: 136 mmol/L (ref 135–145)

## 2018-03-16 LAB — POCT INR: INR: 2.6 (ref 2.0–3.0)

## 2018-03-16 MED ORDER — AMLODIPINE BESYLATE 5 MG PO TABS
5.0000 mg | ORAL_TABLET | Freq: Once | ORAL | Status: AC
  Administered 2018-03-16: 5 mg via ORAL
  Filled 2018-03-16: qty 1

## 2018-03-16 NOTE — ED Triage Notes (Signed)
Per EMS-states intermittent headaches for a few days-woke up this am with a headache that is worse than others-recently discharged for [facility] 2 days ago-no history of HTN, BP 200/100-no CP-neuro assessment negative

## 2018-03-16 NOTE — ED Provider Notes (Signed)
[place] DEPT Provider Note   CSN: [id number] Arrival date & time: 03/16/18  1131     History   Chief Complaint Chief Complaint  Patient presents with  . Headache  . Hypertension    HPI [name] is a 84 y.o. female.  Patient had right-sided frontotemporal headache this morning which resolved spontaneously, without treatment.  Her home health nurse took her blood pressure which was noted to be high.  Advised her to come to the emergency department.  She presently denies any complaint except for blurred vision in her right eye which is been present for the past 4 months, unchanged.  Nothing makes symptoms better or worse.  No other complaint.  She was treated with her usual medications this morning  HPI  Past Medical History:  Diagnosis Date  . Arrhythmia    chronic atrial fib  . Atrial fibrillation, chronic   . Chronic anticoagulation   . Chronic atrial fibrillation 01/28/2017  . Chronic venous insufficiency 01/28/2017  . Clavicle fracture 11/20/2017  . Complication of anesthesia    " difficult waking "  . Long term current use of anticoagulant therapy 01/28/2017  . Osteoarthritis   . Presence of permanent cardiac pacemaker 01/28/2017   Original implant 1992 Lead Intermedics 431-04 Generator change 2001 and 2011.  Medtronic generator.   . SSS (sick sinus syndrome) ([place])   . Thyroid disease    hypothyroidism    Patient Active Problem List   Diagnosis Date Noted  . Encounter for therapeutic drug monitoring 03/09/2018  . Malignant hypertension 01/04/2018  . Clavicle fracture 11/20/2017  . Closed fracture of clavicle 11/20/2017  . Fall   . Accelerated hypertension   . Carpal tunnel syndrome of left wrist 08/02/2017  . Carpal tunnel syndrome of right wrist 08/02/2017  . Bradycardia 02/02/2017  . Presence of permanent cardiac pacemaker 01/28/2017  . Chronic a-fib 01/28/2017  . Long term current use of anticoagulant therapy  01/28/2017  . Chronic venous insufficiency 01/28/2017  . Chronic atrial fibrillation 01/28/2017  . Peripheral venous insufficiency 01/28/2017  . Cardiac pacemaker in situ 01/28/2017  . DOE (dyspnea on exertion) 09/22/2015  . Dyspnea on exertion 09/22/2015  . 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 05/15/2014  . After cataract of left eye not obscuring vision 05/15/2014  . Bilateral dry eyes 05/15/2014  . Dermatochalasis of eyelid 05/15/2014  . Hyperopia of both eyes with astigmatism and presbyopia 05/15/2014  . Irregular astigmatism of right eye 05/15/2014  . Mechanical complication due to intraocular lens implant 05/15/2014  . 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 Vitreous syneresis of both eyes 05/15/2014    Past Surgical History:  Procedure Laterality Date  . ABDOMINAL HYSTERECTOMY    . INSERT / REPLACE / REMOVE PACEMAKER     generator change 2011 medtronic sigma  . KNEE SURGERY    . PACEMAKER INSERTION    . PPM GENERATOR CHANGEOUT N/A 02/02/2017   Procedure: PPM GENERATOR CHANGEOUT;  Surgeon: Deboraha Sprang, MD;  Location: [place] [facility];  Service: Cardiovascular;  Laterality: N/A;  . REPLACEMENT TOTAL KNEE BILATERAL    . VARICOSE VEIN SURGERY       OB History   No obstetric history on file.      Home Medications    Prior to Admission medications   Medication Sig Start Date End Date Taking? Authorizing Provider  acetaminophen (TYLENOL) 500 MG tablet Take 1,000 mg every 8 (eight) hours as needed by mouth for mild pain or  moderate pain.    [provider]  amLODipine (NORVASC) 5 MG tablet Take 5 mg by mouth daily.    [provider]  Bepotastine Besilate (BEPREVE OP) Place 1 drop 2 (two) times daily as needed into both eyes (eye allergies).    [provider]  celecoxib (CELEBREX) 200 MG capsule Take 200 mg at bedtime by mouth.     [provider]  cholecalciferol (VITAMIN D) 1000 units tablet Take 1,000 Units daily by mouth.    [provider]  digoxin (LANOXIN) 0.125 MG tablet Take 0.125 mg every morning by mouth.    [provider]  diphenhydrAMINE (BENADRYL) 25 MG tablet Take 50 mg by mouth every 6 (six) hours as needed (for allergic reaction).     [provider]  fluticasone (FLONASE) 50 MCG/ACT nasal spray Place 1 spray daily as needed into both nostrils for allergies or rhinitis.    [provider]  levothyroxine (SYNTHROID, LEVOTHROID) 125 MCG tablet Take 125 mcg at bedtime by mouth.     [provider]  meclizine (ANTIVERT) 25 MG tablet Take 25 mg by mouth daily as needed for dizziness or nausea.     [provider]  methocarbamol (ROBAXIN) 500 MG tablet Take 1 tablet (500 mg total) by mouth every 8 (eight) hours as needed for muscle spasms. 11/23/17   Bonnielee Haff, MD  oxyCODONE-acetaminophen (PERCOCET/ROXICET) 5-325 MG tablet Take 1-2 tablets by mouth every 4 (four) hours as needed for severe pain. 11/23/17   Bonnielee Haff, MD  polyethylene glycol [facility] / Floria Raveling) packet Take 17 g by mouth daily. 11/24/17   Bonnielee Haff, MD  senna-docusate (SENOKOT-S) 8.6-50 MG tablet Take 1 tablet by mouth 2 (two) times daily. 11/23/17   Bonnielee Haff, MD  sertraline (ZOLOFT) 50 MG tablet Take 25 mg by mouth daily.    [provider]  vitamin B-12 (CYANOCOBALAMIN) 1000 MCG tablet Take 1,000 mcg daily by mouth.    [provider]  warfarin (COUMADIN) 4 MG tablet Take 4 mg at bedtime by mouth.     [provider]    Family History No family history on file.  Social History Social History   Tobacco Use  . Smoking status: Former Research scientist (life sciences)  . Smokeless tobacco: Never Used  Substance Use Topics  . Alcohol use: No  . Drug use: No     Allergies   Penicillins   Review of Systems Review of Systems  Eyes: Positive for visual disturbance.       Chronically blurred vision  left eye  Musculoskeletal: Positive for gait problem.       Walks with  walker  Neurological: Positive for headaches.  All other systems reviewed and are negative.    Physical Exam Updated Vital Signs BP (!) 185/95 (BP Location: Left Arm)   Pulse 88   Temp 97.6 F (36.4 C) (Oral)   Resp 18   SpO2 96%   Physical Exam Vitals signs and nursing note reviewed.  Constitutional:      Appearance: Normal appearance. She is well-developed. She is not ill-appearing.  HENT:     Head: Normocephalic and atraumatic.     Comments: No facial asymmetry Eyes:     Conjunctiva/sclera: Conjunctivae normal.     Pupils: Pupils are equal, round, and reactive to light.  Neck:     Musculoskeletal: Neck supple.     Thyroid: No thyromegaly.     Trachea: No tracheal deviation.  Cardiovascular:  Rate and Rhythm: Normal rate and regular rhythm.     Heart sounds: No murmur.  Pulmonary:     Effort: Pulmonary effort is normal.     Breath sounds: Normal breath sounds.  Abdominal:     General: Bowel sounds are normal. There is no distension.     Palpations: Abdomen is soft.     Tenderness: There is no abdominal tenderness.  Musculoskeletal: Normal range of motion.        General: No tenderness.  Skin:    General: Skin is warm and dry.     Findings: No rash.  Neurological:     Mental Status: She is alert.     Cranial Nerves: No cranial nerve deficit.     Coordination: Coordination normal.     Comments: Nerves II through XII grossly intact.  Motor strength 5/5 overall.  Walks with walker without difficulty.  Not lightheaded on standing      ED Treatments / Results  Labs (all labs ordered are listed, but only abnormal results are displayed) Labs Reviewed - No data to display  EKG None  Radiology No results found.  Procedures Procedures (including critical care time)  Medications Ordered in ED Medications - No data to display   Lab work unremarkable.  INR is therapeutic Results for orders placed or performed during the hospital encounter of 46/27/03    Basic metabolic panel  Result Value Ref Range   Sodium 136 135 - 145 mmol/L   Potassium 4.4 3.5 - 5.1 mmol/L   Chloride 102 98 - 111 mmol/L   CO2 25 22 - 32 mmol/L   Glucose, Bld 89 70 - 99 mg/dL   BUN 19 8 - 23 mg/dL   Creatinine, Ser 0.83 0.44 - 1.00 mg/dL   Calcium 8.9 8.9 - 10.3 mg/dL   GFR calc non Af Amer >60 >60 mL/min   GFR calc Af Amer >60 >60 mL/min   Anion gap 9 5 - 15   Ct Head Wo Contrast  Result Date: 03/16/2018 CLINICAL DATA:  84 year old female with hypertension and intermittent headaches for the past few days but worse today. EXAM: CT HEAD WITHOUT CONTRAST TECHNIQUE: Contiguous axial images were obtained from the base of the skull through the vertex without intravenous contrast. COMPARISON:  Prior head CT 11/20/2017 FINDINGS: Brain: No evidence of acute infarction, hemorrhage, hydrocephalus, extra-axial collection or mass lesion/mass effect. Similar degree of both cortical and central volume loss resulting in compensatory ex vacuo ventriculomegaly. Stable pattern of confluent periventricular and patchy subcortical and deep white matter hypoattenuation which is nonspecific but most consistent with the sequelae of chronic microvascular ischemic white matter disease. Vascular: No hyperdense vessel or unexpected calcification. Atherosclerotic vascular calcifications present in the bilateral cavernous internal carotid arteries. Skull: Normal. Negative for fracture or focal lesion. Sinuses/Orbits: No acute finding. Other: None. IMPRESSION: 1. No acute intracranial abnormality. 2. Stable atrophy, ventriculomegaly and chronic microvascular ischemic white matter disease. Electronically Signed   By: Jacqulynn Cadet M.D.   On: 03/16/2018 14:52   Initial Impression / Assessment and Plan / ED Course  I have reviewed the triage vital signs and the nursing notes.  Pertinent labs & imaging results that were available during my care of the patient were reviewed by me and considered in my  medical decision making (see chart for details).     4:45 PM patient remains asymptomatic.  Alert Glasgow Coma Score 15 There are no signs of hypertensive a lot encephalopathy or hypertensive emergency Plan increase  amlodipine to 10 mg daily.  Recheck blood pressure at PMDs office 1 week  Final Clinical Impressions(s) / ED Diagnoses  Dx #1 bad headache #2  Elevated blood pressure Final diagnoses:  None    ED Discharge Orders    None       Orlie Dakin, MD 03/16/18 1654

## 2018-03-16 NOTE — Discharge Instructions (Addendum)
Increase your amlodipine (norvasc) to 10 mg or 2 tablets daily.  Call Dr. Jacquiline Doe office tomorrow to arrange to get your blood pressure rechecked in his office in 1 week.  Today's blood pressure was elevated at 192/92

## 2018-03-16 NOTE — ED Notes (Signed)
Bed: DK20 Expected date:  Expected time:  Means of arrival:  Comments: Hold for triage 3

## 2018-03-16 NOTE — Patient Instructions (Signed)
Description   Spoke with [name], [facility] RN and advised pt to continue taking 1 tablet daily (5mg ). Reheck INR in 1 week.

## 2018-03-16 NOTE — ED Notes (Signed)
The first purple and Blue lab tubes were found to be not viable. As I write this, a main lab. Phlebotomist is drawing repeat tubes.

## 2018-03-19 DIAGNOSIS — I129 Hypertensive chronic kidney disease with stage 1 through stage 4 chronic kidney disease, or unspecified chronic kidney disease: Secondary | ICD-10-CM | POA: Diagnosis not present

## 2018-03-19 DIAGNOSIS — N183 Chronic kidney disease, stage 3 (moderate): Secondary | ICD-10-CM | POA: Diagnosis not present

## 2018-03-19 DIAGNOSIS — F329 Major depressive disorder, single episode, unspecified: Secondary | ICD-10-CM | POA: Diagnosis not present

## 2018-03-19 DIAGNOSIS — I482 Chronic atrial fibrillation, unspecified: Secondary | ICD-10-CM | POA: Diagnosis not present

## 2018-03-19 DIAGNOSIS — S42001D Fracture of unspecified part of right clavicle, subsequent encounter for fracture with routine healing: Secondary | ICD-10-CM | POA: Diagnosis not present

## 2018-03-19 DIAGNOSIS — M5441 Lumbago with sciatica, right side: Secondary | ICD-10-CM | POA: Diagnosis not present

## 2018-03-21 DIAGNOSIS — Z6826 Body mass index (BMI) 26.0-26.9, adult: Secondary | ICD-10-CM | POA: Diagnosis not present

## 2018-03-21 DIAGNOSIS — I10 Essential (primary) hypertension: Secondary | ICD-10-CM | POA: Diagnosis not present

## 2018-03-23 ENCOUNTER — Ambulatory Visit (INDEPENDENT_AMBULATORY_CARE_PROVIDER_SITE_OTHER): Payer: Medicare Other

## 2018-03-23 DIAGNOSIS — S42001D Fracture of unspecified part of right clavicle, subsequent encounter for fracture with routine healing: Secondary | ICD-10-CM | POA: Diagnosis not present

## 2018-03-23 DIAGNOSIS — F329 Major depressive disorder, single episode, unspecified: Secondary | ICD-10-CM | POA: Diagnosis not present

## 2018-03-23 DIAGNOSIS — Z5181 Encounter for therapeutic drug level monitoring: Secondary | ICD-10-CM

## 2018-03-23 DIAGNOSIS — I482 Chronic atrial fibrillation, unspecified: Secondary | ICD-10-CM | POA: Diagnosis not present

## 2018-03-23 DIAGNOSIS — N183 Chronic kidney disease, stage 3 (moderate): Secondary | ICD-10-CM | POA: Diagnosis not present

## 2018-03-23 DIAGNOSIS — I129 Hypertensive chronic kidney disease with stage 1 through stage 4 chronic kidney disease, or unspecified chronic kidney disease: Secondary | ICD-10-CM | POA: Diagnosis not present

## 2018-03-23 DIAGNOSIS — M5441 Lumbago with sciatica, right side: Secondary | ICD-10-CM | POA: Diagnosis not present

## 2018-03-23 LAB — POCT INR: INR: 2.9 (ref 2.0–3.0)

## 2018-03-23 NOTE — Patient Instructions (Signed)
Description   Spoke with [name], [facility] RN and advised pt to continue taking 1 tablet daily except 1/2 tablet on Mondays, Wednesdays, and Fridays. Reheck INR in 2 weeks.

## 2018-03-24 DIAGNOSIS — I129 Hypertensive chronic kidney disease with stage 1 through stage 4 chronic kidney disease, or unspecified chronic kidney disease: Secondary | ICD-10-CM | POA: Diagnosis not present

## 2018-03-24 DIAGNOSIS — F329 Major depressive disorder, single episode, unspecified: Secondary | ICD-10-CM | POA: Diagnosis not present

## 2018-03-24 DIAGNOSIS — N183 Chronic kidney disease, stage 3 (moderate): Secondary | ICD-10-CM | POA: Diagnosis not present

## 2018-03-24 DIAGNOSIS — M5441 Lumbago with sciatica, right side: Secondary | ICD-10-CM | POA: Diagnosis not present

## 2018-03-24 DIAGNOSIS — I482 Chronic atrial fibrillation, unspecified: Secondary | ICD-10-CM | POA: Diagnosis not present

## 2018-03-24 DIAGNOSIS — S42001D Fracture of unspecified part of right clavicle, subsequent encounter for fracture with routine healing: Secondary | ICD-10-CM | POA: Diagnosis not present

## 2018-03-27 DIAGNOSIS — N183 Chronic kidney disease, stage 3 (moderate): Secondary | ICD-10-CM | POA: Diagnosis not present

## 2018-03-27 DIAGNOSIS — M5441 Lumbago with sciatica, right side: Secondary | ICD-10-CM | POA: Diagnosis not present

## 2018-03-27 DIAGNOSIS — I482 Chronic atrial fibrillation, unspecified: Secondary | ICD-10-CM | POA: Diagnosis not present

## 2018-03-27 DIAGNOSIS — F329 Major depressive disorder, single episode, unspecified: Secondary | ICD-10-CM | POA: Diagnosis not present

## 2018-03-27 DIAGNOSIS — I129 Hypertensive chronic kidney disease with stage 1 through stage 4 chronic kidney disease, or unspecified chronic kidney disease: Secondary | ICD-10-CM | POA: Diagnosis not present

## 2018-03-27 DIAGNOSIS — S42001D Fracture of unspecified part of right clavicle, subsequent encounter for fracture with routine healing: Secondary | ICD-10-CM | POA: Diagnosis not present

## 2018-03-28 DIAGNOSIS — M5441 Lumbago with sciatica, right side: Secondary | ICD-10-CM | POA: Diagnosis not present

## 2018-03-28 DIAGNOSIS — N183 Chronic kidney disease, stage 3 (moderate): Secondary | ICD-10-CM | POA: Diagnosis not present

## 2018-03-28 DIAGNOSIS — I482 Chronic atrial fibrillation, unspecified: Secondary | ICD-10-CM | POA: Diagnosis not present

## 2018-03-28 DIAGNOSIS — I129 Hypertensive chronic kidney disease with stage 1 through stage 4 chronic kidney disease, or unspecified chronic kidney disease: Secondary | ICD-10-CM | POA: Diagnosis not present

## 2018-03-28 DIAGNOSIS — S42001D Fracture of unspecified part of right clavicle, subsequent encounter for fracture with routine healing: Secondary | ICD-10-CM | POA: Diagnosis not present

## 2018-03-28 DIAGNOSIS — F329 Major depressive disorder, single episode, unspecified: Secondary | ICD-10-CM | POA: Diagnosis not present

## 2018-03-29 ENCOUNTER — Other Ambulatory Visit: Payer: Self-pay | Admitting: Pharmacist

## 2018-03-29 DIAGNOSIS — M5441 Lumbago with sciatica, right side: Secondary | ICD-10-CM | POA: Diagnosis not present

## 2018-03-29 DIAGNOSIS — N183 Chronic kidney disease, stage 3 (moderate): Secondary | ICD-10-CM | POA: Diagnosis not present

## 2018-03-29 DIAGNOSIS — I482 Chronic atrial fibrillation, unspecified: Secondary | ICD-10-CM | POA: Diagnosis not present

## 2018-03-29 DIAGNOSIS — S42001D Fracture of unspecified part of right clavicle, subsequent encounter for fracture with routine healing: Secondary | ICD-10-CM | POA: Diagnosis not present

## 2018-03-29 DIAGNOSIS — I129 Hypertensive chronic kidney disease with stage 1 through stage 4 chronic kidney disease, or unspecified chronic kidney disease: Secondary | ICD-10-CM | POA: Diagnosis not present

## 2018-03-29 DIAGNOSIS — F329 Major depressive disorder, single episode, unspecified: Secondary | ICD-10-CM | POA: Diagnosis not present

## 2018-03-29 MED ORDER — WARFARIN SODIUM 5 MG PO TABS: ORAL_TABLET | ORAL | 1 refills | Status: DC

## 2018-03-31 DIAGNOSIS — N183 Chronic kidney disease, stage 3 (moderate): Secondary | ICD-10-CM | POA: Diagnosis not present

## 2018-03-31 DIAGNOSIS — F329 Major depressive disorder, single episode, unspecified: Secondary | ICD-10-CM | POA: Diagnosis not present

## 2018-03-31 DIAGNOSIS — M5441 Lumbago with sciatica, right side: Secondary | ICD-10-CM | POA: Diagnosis not present

## 2018-03-31 DIAGNOSIS — S42001D Fracture of unspecified part of right clavicle, subsequent encounter for fracture with routine healing: Secondary | ICD-10-CM | POA: Diagnosis not present

## 2018-03-31 DIAGNOSIS — I482 Chronic atrial fibrillation, unspecified: Secondary | ICD-10-CM | POA: Diagnosis not present

## 2018-03-31 DIAGNOSIS — I129 Hypertensive chronic kidney disease with stage 1 through stage 4 chronic kidney disease, or unspecified chronic kidney disease: Secondary | ICD-10-CM | POA: Diagnosis not present

## 2018-03-31 NOTE — Telephone Encounter (Signed)
Patient declined to schedule with our office.

## 2018-04-03 DIAGNOSIS — I482 Chronic atrial fibrillation, unspecified: Secondary | ICD-10-CM | POA: Diagnosis not present

## 2018-04-03 DIAGNOSIS — I129 Hypertensive chronic kidney disease with stage 1 through stage 4 chronic kidney disease, or unspecified chronic kidney disease: Secondary | ICD-10-CM | POA: Diagnosis not present

## 2018-04-03 DIAGNOSIS — S42001D Fracture of unspecified part of right clavicle, subsequent encounter for fracture with routine healing: Secondary | ICD-10-CM | POA: Diagnosis not present

## 2018-04-03 DIAGNOSIS — M5441 Lumbago with sciatica, right side: Secondary | ICD-10-CM | POA: Diagnosis not present

## 2018-04-03 DIAGNOSIS — N183 Chronic kidney disease, stage 3 (moderate): Secondary | ICD-10-CM | POA: Diagnosis not present

## 2018-04-03 DIAGNOSIS — F329 Major depressive disorder, single episode, unspecified: Secondary | ICD-10-CM | POA: Diagnosis not present

## 2018-04-04 DIAGNOSIS — N183 Chronic kidney disease, stage 3 (moderate): Secondary | ICD-10-CM | POA: Diagnosis not present

## 2018-04-04 DIAGNOSIS — F329 Major depressive disorder, single episode, unspecified: Secondary | ICD-10-CM | POA: Diagnosis not present

## 2018-04-04 DIAGNOSIS — I482 Chronic atrial fibrillation, unspecified: Secondary | ICD-10-CM | POA: Diagnosis not present

## 2018-04-04 DIAGNOSIS — I129 Hypertensive chronic kidney disease with stage 1 through stage 4 chronic kidney disease, or unspecified chronic kidney disease: Secondary | ICD-10-CM | POA: Diagnosis not present

## 2018-04-04 DIAGNOSIS — S42001D Fracture of unspecified part of right clavicle, subsequent encounter for fracture with routine healing: Secondary | ICD-10-CM | POA: Diagnosis not present

## 2018-04-04 DIAGNOSIS — M5441 Lumbago with sciatica, right side: Secondary | ICD-10-CM | POA: Diagnosis not present

## 2018-04-06 ENCOUNTER — Ambulatory Visit (INDEPENDENT_AMBULATORY_CARE_PROVIDER_SITE_OTHER): Payer: Medicare Other | Admitting: Cardiovascular Disease

## 2018-04-06 DIAGNOSIS — Z5181 Encounter for therapeutic drug level monitoring: Secondary | ICD-10-CM | POA: Diagnosis not present

## 2018-04-06 DIAGNOSIS — I129 Hypertensive chronic kidney disease with stage 1 through stage 4 chronic kidney disease, or unspecified chronic kidney disease: Secondary | ICD-10-CM | POA: Diagnosis not present

## 2018-04-06 DIAGNOSIS — S42001D Fracture of unspecified part of right clavicle, subsequent encounter for fracture with routine healing: Secondary | ICD-10-CM | POA: Diagnosis not present

## 2018-04-06 DIAGNOSIS — F329 Major depressive disorder, single episode, unspecified: Secondary | ICD-10-CM | POA: Diagnosis not present

## 2018-04-06 DIAGNOSIS — I482 Chronic atrial fibrillation, unspecified: Secondary | ICD-10-CM

## 2018-04-06 DIAGNOSIS — M5441 Lumbago with sciatica, right side: Secondary | ICD-10-CM | POA: Diagnosis not present

## 2018-04-06 DIAGNOSIS — N183 Chronic kidney disease, stage 3 (moderate): Secondary | ICD-10-CM | POA: Diagnosis not present

## 2018-04-06 LAB — POCT INR: INR: 3.1 — AB (ref 2.0–3.0)

## 2018-04-06 NOTE — Patient Instructions (Signed)
Description   Spoke with [name], [facility] RN and advised pt to take 1/2 tablet today, then continue taking 1 tablet daily except 1/2 tablet on Mondays, Wednesdays, and Fridays. Reheck INR in 1 week (being discharged from home health then).

## 2018-04-07 DIAGNOSIS — S42001D Fracture of unspecified part of right clavicle, subsequent encounter for fracture with routine healing: Secondary | ICD-10-CM | POA: Diagnosis not present

## 2018-04-07 DIAGNOSIS — I480 Paroxysmal atrial fibrillation: Secondary | ICD-10-CM | POA: Diagnosis not present

## 2018-04-07 DIAGNOSIS — F329 Major depressive disorder, single episode, unspecified: Secondary | ICD-10-CM | POA: Diagnosis not present

## 2018-04-07 DIAGNOSIS — M25551 Pain in right hip: Secondary | ICD-10-CM | POA: Diagnosis not present

## 2018-04-07 DIAGNOSIS — Z6826 Body mass index (BMI) 26.0-26.9, adult: Secondary | ICD-10-CM | POA: Diagnosis not present

## 2018-04-07 DIAGNOSIS — N183 Chronic kidney disease, stage 3 (moderate): Secondary | ICD-10-CM | POA: Diagnosis not present

## 2018-04-07 DIAGNOSIS — I129 Hypertensive chronic kidney disease with stage 1 through stage 4 chronic kidney disease, or unspecified chronic kidney disease: Secondary | ICD-10-CM | POA: Diagnosis not present

## 2018-04-07 DIAGNOSIS — Z7901 Long term (current) use of anticoagulants: Secondary | ICD-10-CM | POA: Diagnosis not present

## 2018-04-07 DIAGNOSIS — I482 Chronic atrial fibrillation, unspecified: Secondary | ICD-10-CM | POA: Diagnosis not present

## 2018-04-07 DIAGNOSIS — M5441 Lumbago with sciatica, right side: Secondary | ICD-10-CM | POA: Diagnosis not present

## 2018-04-07 DIAGNOSIS — I10 Essential (primary) hypertension: Secondary | ICD-10-CM | POA: Diagnosis not present

## 2018-04-11 DIAGNOSIS — M549 Dorsalgia, unspecified: Secondary | ICD-10-CM | POA: Diagnosis not present

## 2018-04-11 DIAGNOSIS — I129 Hypertensive chronic kidney disease with stage 1 through stage 4 chronic kidney disease, or unspecified chronic kidney disease: Secondary | ICD-10-CM | POA: Diagnosis not present

## 2018-04-11 DIAGNOSIS — M5441 Lumbago with sciatica, right side: Secondary | ICD-10-CM | POA: Diagnosis not present

## 2018-04-11 DIAGNOSIS — N183 Chronic kidney disease, stage 3 (moderate): Secondary | ICD-10-CM | POA: Diagnosis not present

## 2018-04-11 DIAGNOSIS — F329 Major depressive disorder, single episode, unspecified: Secondary | ICD-10-CM | POA: Diagnosis not present

## 2018-04-11 DIAGNOSIS — S42001D Fracture of unspecified part of right clavicle, subsequent encounter for fracture with routine healing: Secondary | ICD-10-CM | POA: Diagnosis not present

## 2018-04-11 DIAGNOSIS — I482 Chronic atrial fibrillation, unspecified: Secondary | ICD-10-CM | POA: Diagnosis not present

## 2018-04-13 ENCOUNTER — Ambulatory Visit (INDEPENDENT_AMBULATORY_CARE_PROVIDER_SITE_OTHER): Payer: Medicare Other | Admitting: Cardiology

## 2018-04-13 DIAGNOSIS — N183 Chronic kidney disease, stage 3 (moderate): Secondary | ICD-10-CM | POA: Diagnosis not present

## 2018-04-13 DIAGNOSIS — I129 Hypertensive chronic kidney disease with stage 1 through stage 4 chronic kidney disease, or unspecified chronic kidney disease: Secondary | ICD-10-CM | POA: Diagnosis not present

## 2018-04-13 DIAGNOSIS — F329 Major depressive disorder, single episode, unspecified: Secondary | ICD-10-CM | POA: Diagnosis not present

## 2018-04-13 DIAGNOSIS — S42001D Fracture of unspecified part of right clavicle, subsequent encounter for fracture with routine healing: Secondary | ICD-10-CM | POA: Diagnosis not present

## 2018-04-13 DIAGNOSIS — I482 Chronic atrial fibrillation, unspecified: Secondary | ICD-10-CM | POA: Diagnosis not present

## 2018-04-13 DIAGNOSIS — Z5181 Encounter for therapeutic drug level monitoring: Secondary | ICD-10-CM | POA: Diagnosis not present

## 2018-04-13 DIAGNOSIS — M5441 Lumbago with sciatica, right side: Secondary | ICD-10-CM | POA: Diagnosis not present

## 2018-04-13 LAB — POCT INR: INR: 2 (ref 2.0–3.0)

## 2018-04-13 NOTE — Patient Instructions (Signed)
Description   Spoke with Estill Bamberg, [facility] RN and advised then continue taking 1 tablet daily except 1/2 tablet on Mondays, Wednesdays, and Fridays. Reheck INR in 1 week by [facility] (may be d/c in Feb).

## 2018-04-18 ENCOUNTER — Telehealth: Payer: Self-pay | Admitting: Internal Medicine

## 2018-04-18 ENCOUNTER — Ambulatory Visit (INDEPENDENT_AMBULATORY_CARE_PROVIDER_SITE_OTHER): Payer: Medicare Other | Admitting: Cardiovascular Disease

## 2018-04-18 DIAGNOSIS — I482 Chronic atrial fibrillation, unspecified: Secondary | ICD-10-CM | POA: Diagnosis not present

## 2018-04-18 DIAGNOSIS — Z5181 Encounter for therapeutic drug level monitoring: Secondary | ICD-10-CM

## 2018-04-18 DIAGNOSIS — I129 Hypertensive chronic kidney disease with stage 1 through stage 4 chronic kidney disease, or unspecified chronic kidney disease: Secondary | ICD-10-CM | POA: Diagnosis not present

## 2018-04-18 DIAGNOSIS — M5441 Lumbago with sciatica, right side: Secondary | ICD-10-CM | POA: Diagnosis not present

## 2018-04-18 DIAGNOSIS — F329 Major depressive disorder, single episode, unspecified: Secondary | ICD-10-CM | POA: Diagnosis not present

## 2018-04-18 DIAGNOSIS — S42001D Fracture of unspecified part of right clavicle, subsequent encounter for fracture with routine healing: Secondary | ICD-10-CM | POA: Diagnosis not present

## 2018-04-18 DIAGNOSIS — N183 Chronic kidney disease, stage 3 (moderate): Secondary | ICD-10-CM | POA: Diagnosis not present

## 2018-04-18 LAB — POCT INR: INR: 3.4 — AB (ref 2.0–3.0)

## 2018-04-18 NOTE — Telephone Encounter (Signed)
INR / PT  3.4  TODAY Please call [name] with any changes at below number./cy

## 2018-04-18 NOTE — Patient Instructions (Signed)
Description   Spoke with Danae Chen, [facility] RN and advised to hold today's dose then continue taking 1 tablet daily except 1/2 tablet on Mondays, Wednesdays, and Fridays. Reheck INR in 1 week by [facility] (may be d/c in Feb).

## 2018-04-18 NOTE — Telephone Encounter (Signed)
Spoke with Salley Scarlet and gave orders. Please refer to Anticoagulation Encounter from today. Thanks.

## 2018-04-18 NOTE — Telephone Encounter (Signed)
Erica nurse from [facility] calling to give results.   Call transferred to [facility]

## 2018-04-19 DIAGNOSIS — M5441 Lumbago with sciatica, right side: Secondary | ICD-10-CM | POA: Diagnosis not present

## 2018-04-19 DIAGNOSIS — I129 Hypertensive chronic kidney disease with stage 1 through stage 4 chronic kidney disease, or unspecified chronic kidney disease: Secondary | ICD-10-CM | POA: Diagnosis not present

## 2018-04-19 DIAGNOSIS — N183 Chronic kidney disease, stage 3 (moderate): Secondary | ICD-10-CM | POA: Diagnosis not present

## 2018-04-19 DIAGNOSIS — F329 Major depressive disorder, single episode, unspecified: Secondary | ICD-10-CM | POA: Diagnosis not present

## 2018-04-19 DIAGNOSIS — I482 Chronic atrial fibrillation, unspecified: Secondary | ICD-10-CM | POA: Diagnosis not present

## 2018-04-19 DIAGNOSIS — S42001D Fracture of unspecified part of right clavicle, subsequent encounter for fracture with routine healing: Secondary | ICD-10-CM | POA: Diagnosis not present

## 2018-04-20 ENCOUNTER — Other Ambulatory Visit: Payer: Self-pay | Admitting: Internal Medicine

## 2018-04-21 DIAGNOSIS — I482 Chronic atrial fibrillation, unspecified: Secondary | ICD-10-CM | POA: Diagnosis not present

## 2018-04-21 DIAGNOSIS — S42001D Fracture of unspecified part of right clavicle, subsequent encounter for fracture with routine healing: Secondary | ICD-10-CM | POA: Diagnosis not present

## 2018-04-21 DIAGNOSIS — M5441 Lumbago with sciatica, right side: Secondary | ICD-10-CM | POA: Diagnosis not present

## 2018-04-21 DIAGNOSIS — F329 Major depressive disorder, single episode, unspecified: Secondary | ICD-10-CM | POA: Diagnosis not present

## 2018-04-21 DIAGNOSIS — N183 Chronic kidney disease, stage 3 (moderate): Secondary | ICD-10-CM | POA: Diagnosis not present

## 2018-04-21 DIAGNOSIS — I129 Hypertensive chronic kidney disease with stage 1 through stage 4 chronic kidney disease, or unspecified chronic kidney disease: Secondary | ICD-10-CM | POA: Diagnosis not present

## 2018-04-24 ENCOUNTER — Ambulatory Visit: Payer: Medicare Other

## 2018-04-25 ENCOUNTER — Ambulatory Visit (INDEPENDENT_AMBULATORY_CARE_PROVIDER_SITE_OTHER): Payer: Medicare Other

## 2018-04-25 ENCOUNTER — Telehealth: Payer: Self-pay | Admitting: Internal Medicine

## 2018-04-25 DIAGNOSIS — I495 Sick sinus syndrome: Secondary | ICD-10-CM

## 2018-04-25 DIAGNOSIS — R001 Bradycardia, unspecified: Secondary | ICD-10-CM

## 2018-04-25 NOTE — Telephone Encounter (Signed)
Spoke w/ pt and instructed her how to send a manual transmission w/ her home monitor. Transmission received.  

## 2018-04-25 NOTE — Telephone Encounter (Signed)
New Message    1. Has your device fired? No  2. Is you device beeping? No  3. Are you experiencing draining or swelling at device site? No  4. Are you calling to see if we received your device transmission? Yes, and she needs instructions on doing it properly  5. Have you passed out? No    Please route to [place]

## 2018-04-26 DIAGNOSIS — H02403 Unspecified ptosis of bilateral eyelids: Secondary | ICD-10-CM | POA: Diagnosis not present

## 2018-04-26 DIAGNOSIS — Z961 Presence of intraocular lens: Secondary | ICD-10-CM | POA: Diagnosis not present

## 2018-04-26 DIAGNOSIS — H52203 Unspecified astigmatism, bilateral: Secondary | ICD-10-CM | POA: Diagnosis not present

## 2018-04-26 DIAGNOSIS — H43813 Vitreous degeneration, bilateral: Secondary | ICD-10-CM | POA: Diagnosis not present

## 2018-04-27 ENCOUNTER — Ambulatory Visit (INDEPENDENT_AMBULATORY_CARE_PROVIDER_SITE_OTHER): Payer: Medicare Other | Admitting: Cardiology

## 2018-04-27 DIAGNOSIS — I482 Chronic atrial fibrillation, unspecified: Secondary | ICD-10-CM

## 2018-04-27 DIAGNOSIS — M5441 Lumbago with sciatica, right side: Secondary | ICD-10-CM | POA: Diagnosis not present

## 2018-04-27 DIAGNOSIS — Z5181 Encounter for therapeutic drug level monitoring: Secondary | ICD-10-CM

## 2018-04-27 DIAGNOSIS — S42001D Fracture of unspecified part of right clavicle, subsequent encounter for fracture with routine healing: Secondary | ICD-10-CM | POA: Diagnosis not present

## 2018-04-27 DIAGNOSIS — F329 Major depressive disorder, single episode, unspecified: Secondary | ICD-10-CM | POA: Diagnosis not present

## 2018-04-27 DIAGNOSIS — I129 Hypertensive chronic kidney disease with stage 1 through stage 4 chronic kidney disease, or unspecified chronic kidney disease: Secondary | ICD-10-CM | POA: Diagnosis not present

## 2018-04-27 DIAGNOSIS — N183 Chronic kidney disease, stage 3 (moderate): Secondary | ICD-10-CM | POA: Diagnosis not present

## 2018-04-27 LAB — POCT INR: INR: 2.4 (ref 2.0–3.0)

## 2018-04-27 NOTE — Patient Instructions (Signed)
Description   Spoke with Estill Bamberg, [facility] RN and advised to continue taking 1 tablet daily except 1/2 tablet on Mondays, Wednesdays, and Fridays. Reheck INR in 1 week by [facility] (unsure of d/c date-recertified on 2/6)

## 2018-04-28 LAB — CUP PACEART REMOTE DEVICE CHECK
Battery Impedance: 378 Ohm
Battery Remaining Longevity: 50 mo
Battery Voltage: 2.76 V
Brady Statistic RV Percent Paced: 92 %
Date Time Interrogation Session: 20200204183134
Implantable Lead Implant Date: 20181114
Implantable Pulse Generator Implant Date: 20181114
Lead Channel Impedance Value: 0 Ohm
Lead Channel Impedance Value: 182 Ohm
Lead Channel Setting Pacing Amplitude: 2.5 V
Lead Channel Setting Pacing Pulse Width: 1 ms
Lead Channel Setting Sensing Sensitivity: 2 mV
MDC IDC LEAD LOCATION: 753860

## 2018-04-30 DIAGNOSIS — M5441 Lumbago with sciatica, right side: Secondary | ICD-10-CM | POA: Diagnosis not present

## 2018-04-30 DIAGNOSIS — Z7901 Long term (current) use of anticoagulants: Secondary | ICD-10-CM | POA: Diagnosis not present

## 2018-04-30 DIAGNOSIS — F329 Major depressive disorder, single episode, unspecified: Secondary | ICD-10-CM | POA: Diagnosis not present

## 2018-04-30 DIAGNOSIS — W19XXXD Unspecified fall, subsequent encounter: Secondary | ICD-10-CM | POA: Diagnosis not present

## 2018-04-30 DIAGNOSIS — I482 Chronic atrial fibrillation, unspecified: Secondary | ICD-10-CM | POA: Diagnosis not present

## 2018-04-30 DIAGNOSIS — N183 Chronic kidney disease, stage 3 (moderate): Secondary | ICD-10-CM | POA: Diagnosis not present

## 2018-04-30 DIAGNOSIS — Z950 Presence of cardiac pacemaker: Secondary | ICD-10-CM | POA: Diagnosis not present

## 2018-04-30 DIAGNOSIS — M6281 Muscle weakness (generalized): Secondary | ICD-10-CM | POA: Diagnosis not present

## 2018-04-30 DIAGNOSIS — R41841 Cognitive communication deficit: Secondary | ICD-10-CM | POA: Diagnosis not present

## 2018-04-30 DIAGNOSIS — R1312 Dysphagia, oropharyngeal phase: Secondary | ICD-10-CM | POA: Diagnosis not present

## 2018-04-30 DIAGNOSIS — Z5181 Encounter for therapeutic drug level monitoring: Secondary | ICD-10-CM | POA: Diagnosis not present

## 2018-04-30 DIAGNOSIS — I129 Hypertensive chronic kidney disease with stage 1 through stage 4 chronic kidney disease, or unspecified chronic kidney disease: Secondary | ICD-10-CM | POA: Diagnosis not present

## 2018-04-30 DIAGNOSIS — S42001D Fracture of unspecified part of right clavicle, subsequent encounter for fracture with routine healing: Secondary | ICD-10-CM | POA: Diagnosis not present

## 2018-05-04 ENCOUNTER — Ambulatory Visit (INDEPENDENT_AMBULATORY_CARE_PROVIDER_SITE_OTHER): Payer: Medicare Other | Admitting: Cardiovascular Disease

## 2018-05-04 DIAGNOSIS — S42001D Fracture of unspecified part of right clavicle, subsequent encounter for fracture with routine healing: Secondary | ICD-10-CM | POA: Diagnosis not present

## 2018-05-04 DIAGNOSIS — I129 Hypertensive chronic kidney disease with stage 1 through stage 4 chronic kidney disease, or unspecified chronic kidney disease: Secondary | ICD-10-CM | POA: Diagnosis not present

## 2018-05-04 DIAGNOSIS — M5441 Lumbago with sciatica, right side: Secondary | ICD-10-CM | POA: Diagnosis not present

## 2018-05-04 DIAGNOSIS — N183 Chronic kidney disease, stage 3 (moderate): Secondary | ICD-10-CM | POA: Diagnosis not present

## 2018-05-04 DIAGNOSIS — Z5181 Encounter for therapeutic drug level monitoring: Secondary | ICD-10-CM | POA: Diagnosis not present

## 2018-05-04 DIAGNOSIS — I482 Chronic atrial fibrillation, unspecified: Secondary | ICD-10-CM

## 2018-05-04 DIAGNOSIS — F329 Major depressive disorder, single episode, unspecified: Secondary | ICD-10-CM | POA: Diagnosis not present

## 2018-05-04 LAB — POCT INR: INR: 4 — AB (ref 2.0–3.0)

## 2018-05-04 NOTE — Progress Notes (Signed)
Remote pacemaker transmission.   

## 2018-05-04 NOTE — Patient Instructions (Addendum)
Description   Spoke with [name], [facility] RN and advised to hold today's dose then continue taking the dose she should have been taking which is 1 tablet daily except 1/2 tablet on Mondays, Wednesdays, and Fridays. Reheck INR in 1 week by [facility] (for 3 more weeks).

## 2018-05-11 ENCOUNTER — Ambulatory Visit (INDEPENDENT_AMBULATORY_CARE_PROVIDER_SITE_OTHER): Payer: Medicare Other | Admitting: Interventional Cardiology

## 2018-05-11 DIAGNOSIS — Z5181 Encounter for therapeutic drug level monitoring: Secondary | ICD-10-CM

## 2018-05-11 DIAGNOSIS — I482 Chronic atrial fibrillation, unspecified: Secondary | ICD-10-CM

## 2018-05-11 DIAGNOSIS — N183 Chronic kidney disease, stage 3 (moderate): Secondary | ICD-10-CM | POA: Diagnosis not present

## 2018-05-11 DIAGNOSIS — I129 Hypertensive chronic kidney disease with stage 1 through stage 4 chronic kidney disease, or unspecified chronic kidney disease: Secondary | ICD-10-CM | POA: Diagnosis not present

## 2018-05-11 DIAGNOSIS — M5441 Lumbago with sciatica, right side: Secondary | ICD-10-CM | POA: Diagnosis not present

## 2018-05-11 DIAGNOSIS — F329 Major depressive disorder, single episode, unspecified: Secondary | ICD-10-CM | POA: Diagnosis not present

## 2018-05-11 DIAGNOSIS — S42001D Fracture of unspecified part of right clavicle, subsequent encounter for fracture with routine healing: Secondary | ICD-10-CM | POA: Diagnosis not present

## 2018-05-11 LAB — POCT INR: INR: 3.2 — AB (ref 2.0–3.0)

## 2018-05-11 NOTE — Patient Instructions (Addendum)
Description   Spoke with [name], [facility] RN and advised to take 1/2 tablet today then start taking 1/2 tablet daily except 1 tablet on Tuesday, Thursday, and Saturday.  Reheck INR in 1 week by [facility] (for 3 more weeks).

## 2018-05-16 ENCOUNTER — Ambulatory Visit (INDEPENDENT_AMBULATORY_CARE_PROVIDER_SITE_OTHER): Payer: Medicare Other | Admitting: Interventional Cardiology

## 2018-05-16 DIAGNOSIS — N183 Chronic kidney disease, stage 3 (moderate): Secondary | ICD-10-CM | POA: Diagnosis not present

## 2018-05-16 DIAGNOSIS — I482 Chronic atrial fibrillation, unspecified: Secondary | ICD-10-CM | POA: Diagnosis not present

## 2018-05-16 DIAGNOSIS — Z5181 Encounter for therapeutic drug level monitoring: Secondary | ICD-10-CM

## 2018-05-16 DIAGNOSIS — S42001D Fracture of unspecified part of right clavicle, subsequent encounter for fracture with routine healing: Secondary | ICD-10-CM | POA: Diagnosis not present

## 2018-05-16 DIAGNOSIS — M5441 Lumbago with sciatica, right side: Secondary | ICD-10-CM | POA: Diagnosis not present

## 2018-05-16 DIAGNOSIS — I129 Hypertensive chronic kidney disease with stage 1 through stage 4 chronic kidney disease, or unspecified chronic kidney disease: Secondary | ICD-10-CM | POA: Diagnosis not present

## 2018-05-16 DIAGNOSIS — F329 Major depressive disorder, single episode, unspecified: Secondary | ICD-10-CM | POA: Diagnosis not present

## 2018-05-16 LAB — POCT INR: INR: 2 (ref 2.0–3.0)

## 2018-05-16 NOTE — Patient Instructions (Signed)
Description   Spoke with Estill Bamberg, [facility] RN and advised continue taking 1/2 tablet daily except 1 tablet on Tuesday, Thursday, and Saturday.  Reheck INR in 1 week by [facility] (last visit).

## 2018-05-23 ENCOUNTER — Ambulatory Visit (INDEPENDENT_AMBULATORY_CARE_PROVIDER_SITE_OTHER): Payer: Medicare Other | Admitting: Cardiology

## 2018-05-23 DIAGNOSIS — I482 Chronic atrial fibrillation, unspecified: Secondary | ICD-10-CM

## 2018-05-23 DIAGNOSIS — S42001D Fracture of unspecified part of right clavicle, subsequent encounter for fracture with routine healing: Secondary | ICD-10-CM | POA: Diagnosis not present

## 2018-05-23 DIAGNOSIS — F329 Major depressive disorder, single episode, unspecified: Secondary | ICD-10-CM | POA: Diagnosis not present

## 2018-05-23 DIAGNOSIS — M5441 Lumbago with sciatica, right side: Secondary | ICD-10-CM | POA: Diagnosis not present

## 2018-05-23 DIAGNOSIS — Z5181 Encounter for therapeutic drug level monitoring: Secondary | ICD-10-CM

## 2018-05-23 DIAGNOSIS — I129 Hypertensive chronic kidney disease with stage 1 through stage 4 chronic kidney disease, or unspecified chronic kidney disease: Secondary | ICD-10-CM | POA: Diagnosis not present

## 2018-05-23 DIAGNOSIS — N183 Chronic kidney disease, stage 3 (moderate): Secondary | ICD-10-CM | POA: Diagnosis not present

## 2018-05-23 LAB — POCT INR: INR: 2.2 (ref 2.0–3.0)

## 2018-05-23 NOTE — Patient Instructions (Addendum)
Description   Spoke with Andria Frames, [facility] RN and advised continue taking 1/2 tablet daily except 1 tablet on Tuesday, Thursday, and Saturday.  Reheck INR in 2 weeks in the office. [facility] [phone] Main [phone]

## 2018-06-01 ENCOUNTER — Encounter: Payer: Self-pay | Admitting: Internal Medicine

## 2018-06-01 ENCOUNTER — Other Ambulatory Visit: Payer: Self-pay

## 2018-06-01 ENCOUNTER — Ambulatory Visit (INDEPENDENT_AMBULATORY_CARE_PROVIDER_SITE_OTHER): Payer: Medicare Other | Admitting: *Deleted

## 2018-06-01 DIAGNOSIS — Z5181 Encounter for therapeutic drug level monitoring: Secondary | ICD-10-CM | POA: Diagnosis not present

## 2018-06-01 DIAGNOSIS — I482 Chronic atrial fibrillation, unspecified: Secondary | ICD-10-CM | POA: Diagnosis not present

## 2018-06-01 LAB — POCT INR: INR: 2.5 (ref 2.0–3.0)

## 2018-06-01 NOTE — Patient Instructions (Signed)
Description   Continue taking 1/2 tablet daily except 1 tablet on Tuesday, Thursday, and Saturday.  Reheck INR in 3 weeks in the office. [facility] [phone] Main [phone]

## 2018-06-06 ENCOUNTER — Telehealth: Payer: Self-pay

## 2018-06-06 NOTE — Telephone Encounter (Signed)
Called pt to assess her needs and to discuss upcoming appointment. Pt was inappropriatly scheduled from a recall. Per Dr Olin Pia last [place] note in 10/19, pt is to follow up in one year.  I explained this to patient and she agrees to cancel her upcoming appt on 3/23. She denies any needs or symptoms at this time. She understands she may call the office for any needs in the meantime.  Device clinic triage also spoke with pt and gave her remote transmission instructions.   Recall has been placed for October 2020.

## 2018-06-12 ENCOUNTER — Encounter: Payer: Medicare Other | Admitting: Internal Medicine

## 2018-06-20 ENCOUNTER — Telehealth: Payer: Self-pay

## 2018-06-20 NOTE — Telephone Encounter (Signed)

## 2018-06-22 ENCOUNTER — Ambulatory Visit (INDEPENDENT_AMBULATORY_CARE_PROVIDER_SITE_OTHER): Payer: Medicare Other | Admitting: Pharmacist

## 2018-06-22 DIAGNOSIS — I482 Chronic atrial fibrillation, unspecified: Secondary | ICD-10-CM

## 2018-06-22 DIAGNOSIS — Z5181 Encounter for therapeutic drug level monitoring: Secondary | ICD-10-CM

## 2018-06-22 LAB — POCT INR: INR: 2.3 (ref 2.0–3.0)

## 2018-07-07 ENCOUNTER — Other Ambulatory Visit: Payer: Self-pay | Admitting: Pharmacist

## 2018-07-07 MED ORDER — WARFARIN SODIUM 5 MG PO TABS: ORAL_TABLET | ORAL | 1 refills | Status: DC

## 2018-07-18 DIAGNOSIS — H43811 Vitreous degeneration, right eye: Secondary | ICD-10-CM | POA: Diagnosis not present

## 2018-07-19 ENCOUNTER — Ambulatory Visit (HOSPITAL_COMMUNITY): Payer: Medicare Other | Admitting: Anesthesiology

## 2018-07-19 ENCOUNTER — Encounter (HOSPITAL_COMMUNITY)
Admission: RE | Disposition: A | Discharge status: Home / Self Care | Payer: Self-pay | Source: Ambulatory Visit | Attending: Ophthalmology

## 2018-07-19 ENCOUNTER — Other Ambulatory Visit: Payer: Self-pay

## 2018-07-19 ENCOUNTER — Encounter (HOSPITAL_COMMUNITY): Payer: Self-pay

## 2018-07-19 ENCOUNTER — Ambulatory Visit (HOSPITAL_COMMUNITY)
Admission: RE | Admit: 2018-07-19 | Discharge: 2018-07-19 | Disposition: A | Discharge status: Home / Self Care | Payer: Medicare Other | Source: Ambulatory Visit | Attending: Ophthalmology | Admitting: Ophthalmology

## 2018-07-19 DIAGNOSIS — H43391 Other vitreous opacities, right eye: Secondary | ICD-10-CM | POA: Diagnosis not present

## 2018-07-19 DIAGNOSIS — I872 Venous insufficiency (chronic) (peripheral): Secondary | ICD-10-CM | POA: Diagnosis not present

## 2018-07-19 DIAGNOSIS — H35373 Puckering of macula, bilateral: Secondary | ICD-10-CM | POA: Diagnosis not present

## 2018-07-19 DIAGNOSIS — I10 Essential (primary) hypertension: Secondary | ICD-10-CM | POA: Diagnosis not present

## 2018-07-19 DIAGNOSIS — H35363 Drusen (degenerative) of macula, bilateral: Secondary | ICD-10-CM | POA: Diagnosis not present

## 2018-07-19 DIAGNOSIS — I482 Chronic atrial fibrillation, unspecified: Secondary | ICD-10-CM | POA: Diagnosis not present

## 2018-07-19 DIAGNOSIS — I495 Sick sinus syndrome: Secondary | ICD-10-CM | POA: Insufficient documentation

## 2018-07-19 DIAGNOSIS — M1990 Unspecified osteoarthritis, unspecified site: Secondary | ICD-10-CM | POA: Diagnosis not present

## 2018-07-19 DIAGNOSIS — Z87891 Personal history of nicotine dependence: Secondary | ICD-10-CM | POA: Insufficient documentation

## 2018-07-19 DIAGNOSIS — Z950 Presence of cardiac pacemaker: Secondary | ICD-10-CM | POA: Diagnosis not present

## 2018-07-19 DIAGNOSIS — H44001 Unspecified purulent endophthalmitis, right eye: Secondary | ICD-10-CM | POA: Diagnosis not present

## 2018-07-19 DIAGNOSIS — Z7901 Long term (current) use of anticoagulants: Secondary | ICD-10-CM | POA: Insufficient documentation

## 2018-07-19 DIAGNOSIS — H5319 Other subjective visual disturbances: Secondary | ICD-10-CM | POA: Diagnosis not present

## 2018-07-19 HISTORY — PX: PARS PLANA VITRECTOMY: SHX2166

## 2018-07-19 LAB — GRAM STAIN: Gram Stain: NONE SEEN

## 2018-07-19 LAB — CBC
HCT: 41.7 % (ref 36.0–46.0)
Hemoglobin: 13.4 g/dL (ref 12.0–15.0)
MCH: 29.1 pg (ref 26.0–34.0)
MCHC: 32.1 g/dL (ref 30.0–36.0)
MCV: 90.5 fL (ref 80.0–100.0)
Platelets: 234 10*3/uL (ref 150–400)
RBC: 4.61 MIL/uL (ref 3.87–5.11)
RDW: 15.1 % (ref 11.5–15.5)
WBC: 6.1 10*3/uL (ref 4.0–10.5)
nRBC: 0 % (ref 0.0–0.2)

## 2018-07-19 LAB — BASIC METABOLIC PANEL
Anion gap: 12 (ref 5–15)
BUN: 26 mg/dL — ABNORMAL HIGH (ref 8–23)
CO2: 23 mmol/L (ref 22–32)
Calcium: 9.3 mg/dL (ref 8.9–10.3)
Chloride: 102 mmol/L (ref 98–111)
Creatinine, Ser: 1.19 mg/dL — ABNORMAL HIGH (ref 0.44–1.00)
GFR calc Af Amer: 48 mL/min — ABNORMAL LOW (ref >60)
GFR calc non Af Amer: 42 mL/min — ABNORMAL LOW (ref >60)
Glucose, Bld: 102 mg/dL — ABNORMAL HIGH (ref 70–99)
Potassium: 4.4 mmol/L (ref 3.5–5.1)
Sodium: 137 mmol/L (ref 135–145)

## 2018-07-19 LAB — PROTIME-INR
INR: 2.7 — ABNORMAL HIGH (ref 0.8–1.2)
Prothrombin Time: 28.3 seconds — ABNORMAL HIGH (ref 11.4–15.2)

## 2018-07-19 SURGERY — PARS PLANA VITRECTOMY 25 GAUGE FOR ENDOPHTHALMITIS
Anesthesia: Monitor Anesthesia Care | Site: Eye | Laterality: Right

## 2018-07-19 MED ORDER — PHENYLEPHRINE HCL 2.5 % OP SOLN
1.0000 [drp] | OPHTHALMIC | Status: AC | PRN
  Administered 2018-07-19 (×3): 1 [drp] via OPHTHALMIC
  Filled 2018-07-19: qty 2

## 2018-07-19 MED ORDER — FOSCARNET INTRACAMERAL INJECTION 1.2 MG/0.05 ML KALEIDOSCOPE
12.0000 mg | INTRAVITREAL | Status: DC
  Filled 2018-07-19: qty 0.5

## 2018-07-19 MED ORDER — LIDOCAINE 2% (20 MG/ML) 5 ML SYRINGE
INTRAMUSCULAR | Status: AC
  Filled 2018-07-19: qty 5

## 2018-07-19 MED ORDER — EPINEPHRINE PF 1 MG/ML IJ SOLN
INTRAMUSCULAR | Status: AC
  Filled 2018-07-19: qty 1

## 2018-07-19 MED ORDER — PROPOFOL 10 MG/ML IV BOLUS
INTRAVENOUS | Status: DC | PRN
  Administered 2018-07-19: 10 mg via INTRAVENOUS
  Administered 2018-07-19: 30 mg via INTRAVENOUS

## 2018-07-19 MED ORDER — TOBRAMYCIN-DEXAMETHASONE 0.3-0.1 % OP OINT
TOPICAL_OINTMENT | OPHTHALMIC | Status: AC
  Filled 2018-07-19: qty 3.5

## 2018-07-19 MED ORDER — DEXAMETHASONE SODIUM PHOSPHATE 10 MG/ML IJ SOLN
INTRAMUSCULAR | Status: DC | PRN
  Administered 2018-07-19: 10 mg

## 2018-07-19 MED ORDER — FENTANYL CITRATE (PF) 100 MCG/2ML IJ SOLN
INTRAMUSCULAR | Status: DC | PRN
  Administered 2018-07-19: 25 ug via INTRAVENOUS

## 2018-07-19 MED ORDER — EPINEPHRINE PF 1 MG/ML IJ SOLN
INTRAOCULAR | Status: DC | PRN
  Administered 2018-07-19: 16:00:00 500 mL

## 2018-07-19 MED ORDER — FENTANYL CITRATE (PF) 250 MCG/5ML IJ SOLN
INTRAMUSCULAR | Status: AC
  Filled 2018-07-19: qty 5

## 2018-07-19 MED ORDER — PROPOFOL 10 MG/ML IV BOLUS
INTRAVENOUS | Status: AC
  Filled 2018-07-19: qty 20

## 2018-07-19 MED ORDER — AMIKACIN INTRAVITREAL INJECTION 0.4 MG/0.1 ML
2.0000 mg | INTRAOCULAR | Status: DC
  Filled 2018-07-19: qty 0.5

## 2018-07-19 MED ORDER — OFLOXACIN 0.3 % OP SOLN
1.0000 [drp] | OPHTHALMIC | Status: AC | PRN
  Administered 2018-07-19: 15:00:00 1 [drp] via OPHTHALMIC
  Filled 2018-07-19: qty 5

## 2018-07-19 MED ORDER — BSS IO SOLN
INTRAOCULAR | Status: DC | PRN
  Administered 2018-07-19: 15 mL via INTRAOCULAR

## 2018-07-19 MED ORDER — PHENYLEPHRINE 40 MCG/ML (10ML) SYRINGE FOR IV PUSH (FOR BLOOD PRESSURE SUPPORT)
PREFILLED_SYRINGE | INTRAVENOUS | Status: AC
  Filled 2018-07-19: qty 10

## 2018-07-19 MED ORDER — PROPARACAINE HCL 0.5 % OP SOLN
1.0000 [drp] | OPHTHALMIC | Status: DC | PRN
  Administered 2018-07-19: 1 [drp] via OPHTHALMIC
  Filled 2018-07-19: qty 15

## 2018-07-19 MED ORDER — TOBRAMYCIN-DEXAMETHASONE 0.3-0.1 % OP OINT
TOPICAL_OINTMENT | OPHTHALMIC | Status: DC | PRN
  Administered 2018-07-19: 1 via OPHTHALMIC

## 2018-07-19 MED ORDER — NA CHONDROIT SULF-NA HYALURON 40-30 MG/ML IO SOLN
INTRAOCULAR | Status: AC
  Filled 2018-07-19: qty 0.5

## 2018-07-19 MED ORDER — LIDOCAINE HCL (PF) 2 % IJ SOLN
INTRAMUSCULAR | Status: AC
  Filled 2018-07-19: qty 10

## 2018-07-19 MED ORDER — DEXAMETHASONE SODIUM PHOSPHATE 10 MG/ML IJ SOLN
INTRAMUSCULAR | Status: AC
  Filled 2018-07-19: qty 1

## 2018-07-19 MED ORDER — BSS IO SOLN
INTRAOCULAR | Status: AC
  Filled 2018-07-19: qty 15

## 2018-07-19 MED ORDER — SODIUM HYALURONATE 10 MG/ML IO SOLN
INTRAOCULAR | Status: AC
  Filled 2018-07-19: qty 0.85

## 2018-07-19 MED ORDER — BSS PLUS IO SOLN
INTRAOCULAR | Status: AC
  Filled 2018-07-19: qty 500

## 2018-07-19 MED ORDER — VANCOMYCIN INTRAVITREAL INJECTION 1 MG/0.1 ML
1.0000 mg | INTRAOCULAR | Status: AC
  Administered 2018-07-19: 1 mg via INTRAVITREAL
  Filled 2018-07-19: qty 0

## 2018-07-19 MED ORDER — PROVISC 10 MG/ML IO SOLN
INTRAOCULAR | Status: DC | PRN
  Administered 2018-07-19: .85 mL via INTRAOCULAR

## 2018-07-19 MED ORDER — TETRACAINE HCL 0.5 % OP SOLN
OPHTHALMIC | Status: AC
  Filled 2018-07-19: qty 4

## 2018-07-19 MED ORDER — HYALURONIDASE HUMAN 150 UNIT/ML IJ SOLN
INTRAMUSCULAR | Status: AC
  Filled 2018-07-19: qty 1

## 2018-07-19 MED ORDER — LIDOCAINE HCL 2 % IJ SOLN
INTRAMUSCULAR | Status: DC | PRN
  Administered 2018-07-19: 6 mL via RETROBULBAR

## 2018-07-19 MED ORDER — ONDANSETRON HCL 4 MG/2ML IJ SOLN
INTRAMUSCULAR | Status: DC | PRN
  Administered 2018-07-19: 4 mg via INTRAVENOUS

## 2018-07-19 MED ORDER — TROPICAMIDE 1 % OP SOLN
1.0000 [drp] | OPHTHALMIC | Status: AC | PRN
  Administered 2018-07-19 (×3): 1 [drp] via OPHTHALMIC
  Filled 2018-07-19: qty 15

## 2018-07-19 MED ORDER — SODIUM CHLORIDE 0.9 % IV SOLN
INTRAVENOUS | Status: DC
  Administered 2018-07-19: 13:00:00 via INTRAVENOUS

## 2018-07-19 MED ORDER — BUPIVACAINE HCL (PF) 0.75 % IJ SOLN
INTRAMUSCULAR | Status: AC
  Filled 2018-07-19: qty 10

## 2018-07-19 MED ORDER — SUCCINYLCHOLINE CHLORIDE 200 MG/10ML IV SOSY
PREFILLED_SYRINGE | INTRAVENOUS | Status: AC
  Filled 2018-07-19: qty 10

## 2018-07-19 MED ORDER — ATROPINE SULFATE 1 % OP SOLN
OPHTHALMIC | Status: AC
  Filled 2018-07-19: qty 5

## 2018-07-19 SURGICAL SUPPLY — 55 items
APPLICATOR COTTON TIP 6IN STRL (MISCELLANEOUS) ×2 IMPLANT
BLADE MVR KNIFE 20G (BLADE) IMPLANT
CANNULA ANT CHAM MAIN (OPHTHALMIC RELATED) IMPLANT
CANNULA DUAL BORE 23G (CANNULA) IMPLANT
CANNULA DUALBORE 25G (CANNULA) IMPLANT
CANNULA VLV SOFT TIP 25GA (OPHTHALMIC) ×2 IMPLANT
CAUTERY EYE LOW TEMP 1300F FIN (OPHTHALMIC RELATED) IMPLANT
CLSR STERI-STRIP ANTIMIC 1/2X4 (GAUZE/BANDAGES/DRESSINGS) ×2 IMPLANT
CORD BIPOLAR FORCEPS 12FT (ELECTRODE) IMPLANT
CORDS BIPOLAR (ELECTRODE) IMPLANT
COVER MAYO STAND STRL (DRAPES) IMPLANT
COVER WAND RF STERILE (DRAPES) ×2 IMPLANT
DRAPE HALF SHEET 40X57 (DRAPES) ×2 IMPLANT
DRAPE INCISE 51X51 W/FILM STRL (DRAPES) IMPLANT
DRAPE RETRACTOR (MISCELLANEOUS) ×2 IMPLANT
ERASER HMR WETFIELD 23G BP (MISCELLANEOUS) IMPLANT
FILTER STRAW FLUID ASPIR (MISCELLANEOUS) ×2 IMPLANT
FORCEPS ECKARDT ILM 25G SERR (OPHTHALMIC RELATED) IMPLANT
FORCEPS GRIESHABER ILM 25G A (INSTRUMENTS) IMPLANT
GAS AUTO FILL CONSTEL (OPHTHALMIC)
GAS AUTO FILL CONSTELLATION (OPHTHALMIC) IMPLANT
GLOVE ECLIPSE 7.5 STRL STRAW (GLOVE) ×4 IMPLANT
GOWN STRL REUS W/ TWL LRG LVL3 (GOWN DISPOSABLE) ×1 IMPLANT
GOWN STRL REUS W/TWL LRG LVL3 (GOWN DISPOSABLE) ×1
HANDLE PNEUMATIC FOR CONSTEL (OPHTHALMIC) IMPLANT
KIT BASIN OR (CUSTOM PROCEDURE TRAY) ×2 IMPLANT
KIT TURNOVER KIT B (KITS) ×2 IMPLANT
LENS BIOM SUPER VIEW SET DISP (OPHTHALMIC RELATED) ×4 IMPLANT
MICROPICK 25G (MISCELLANEOUS)
NEEDLE 18GX1X1/2 (RX/OR ONLY) (NEEDLE) ×4 IMPLANT
NEEDLE 25GX 5/8IN NON SAFETY (NEEDLE) ×2 IMPLANT
NEEDLE FILTER BLUNT 18X 1/2SAF (NEEDLE) ×4
NEEDLE FILTER BLUNT 18X1 1/2 (NEEDLE) ×4 IMPLANT
NEEDLE HYPO 25GX1X1/2 BEV (NEEDLE) IMPLANT
NEEDLE HYPO 30X.5 LL (NEEDLE) ×10 IMPLANT
NEEDLE RETROBULBAR 25GX1.5 (NEEDLE) ×2 IMPLANT
NS IRRIG 1000ML POUR BTL (IV SOLUTION) ×2 IMPLANT
PACK VITRECTOMY CUSTOM (CUSTOM PROCEDURE TRAY) ×2 IMPLANT
PAD ARMBOARD 7.5X6 YLW CONV (MISCELLANEOUS) ×4 IMPLANT
PAK PIK VITRECTOMY CVS 25GA (OPHTHALMIC) ×2 IMPLANT
PENCIL BIPOLAR 25GA STR DISP (OPHTHALMIC RELATED) IMPLANT
PICK MICROPICK 25G (MISCELLANEOUS) IMPLANT
PROBE LASER ILLUM FLEX CVD 25G (OPHTHALMIC) ×2 IMPLANT
ROLLS DENTAL (MISCELLANEOUS) IMPLANT
SCRAPER DIAMOND 25GA (OPHTHALMIC RELATED) IMPLANT
SOLUTION ANTI FOG 6CC (MISCELLANEOUS) ×2 IMPLANT
STOPCOCK 4 WAY LG BORE MALE ST (IV SETS) ×2 IMPLANT
SUT VICRYL 7 0 TG140 8 (SUTURE) ×2 IMPLANT
SYR 10ML LL (SYRINGE) IMPLANT
SYR 20CC LL (SYRINGE) ×2 IMPLANT
SYR 5ML LL (SYRINGE) ×2 IMPLANT
SYR TB 1ML LUER SLIP (SYRINGE) ×10 IMPLANT
TOWEL OR 17X26 4PK STRL BLUE (TOWEL DISPOSABLE) ×2 IMPLANT
WATER STERILE IRR 1000ML POUR (IV SOLUTION) ×2 IMPLANT
WIPE INSTRUMENT VISIWIPE 73X73 (MISCELLANEOUS) IMPLANT

## 2018-07-19 NOTE — Op Note (Signed)
[name] 07/19/2018 Diagnosis: ENDOPHTHALMITIS RIGHT EYE  Procedure: [place] VITRECTOMY WITH VITREOUS BIPOSY, INJECTION OF INTRAVITREAL ANTIVIRAL AND ANTIBIOTICS & ENDOLASER (Right) Operative Eye:  right eye  Surgeon: Royston Cowper Estimated Blood Loss: minimal Specimens for Pathology:  None Complications: none   The  patient was prepped and draped in the usual fashion for ocular surgery on the  right eye .  A lid speculum was placed.  Infusion line and trocar was placed at the 8 o'clock position approximately 3.5 mm from the surgical limbus.   The infusion line was allowed to run and then clamped when placed at the cannula opening. The line was inserted and secured to the drape with an adhesive strip.   Active trocars/cannula were placed at the 10 and 2 o'clock positions approximately 3.5 mm from the surgical limbus. The cannula was visualized in the vitreous cavity.  The light pipe and vitreous cutter were inserted into the vitreous cavity and a core vitrectomy was performed without infusion for a dry tap to obtain vitreous biopsy.  Infusion was then turned on and core vitrectomy completed. Care taken to remove the vitreous up to the vitreous base for 360 degrees with the aid of scleral depression.  Peripheral perivasculitis was noted with whitening of the perivascular retina.     3 rows of endolaser were applied 360 degrees to the periphery surrounding these areas of vaculitis.  A partial air-fluid exchange was performed.  Intravitreal antibiotics were placed (1mg  Vancomycin and 430mcg Amikacin).  Intravitreal Foscarnet 1.2mg  was placed.  The superior cannulas were sequentially removed with concommitant tamponade using a cotton tipped applicator and noted to be air tight.  The infusion line and trocar were removed and the sclerotomy was noted to be air tight with normal intraocular pressure by digital palpapation.  Subconjunctival injections of  Dexamethasone 4mg /4ml was  placed in the infero-medial quadrant.   The speculum and drapes were removed and the eye was patched with Polymixin/Bacitracin ophthalmic ointment. An eye shield was placed and the patient was transferred alert and conversant with stable vital signs to the post operative recovery area.  The patient tolerated the procedure well and no complications were noted.  Royston Cowper MD

## 2018-07-19 NOTE — Anesthesia Preprocedure Evaluation (Signed)
Anesthesia Evaluation  Patient identified by MRN, date of birth, ID band Patient awake    Reviewed: Allergy & Precautions, NPO status   Airway Mallampati: II  TM Distance: >3 FB     Dental   Pulmonary former smoker,    breath sounds clear to auscultation       Cardiovascular hypertension, + DOE  + pacemaker  Rhythm:Regular Rate:Normal     Neuro/Psych    GI/Hepatic negative GI ROS, Neg liver ROS,   Endo/Other  negative endocrine ROS  Renal/GU negative Renal ROS     Musculoskeletal  (+) Arthritis ,   Abdominal   Peds  Hematology   Anesthesia Other Findings   Reproductive/Obstetrics                             Anesthesia Physical Anesthesia Plan  ASA: III  Anesthesia Plan: General and MAC   Post-op Pain Management:    Induction: Intravenous  PONV Risk Score and Plan: Ondansetron, Dexamethasone and Midazolam  Airway Management Planned: Simple Face Mask and Oral ETT  Additional Equipment:   Intra-op Plan:   Post-operative Plan: Extubation in OR  Informed Consent: I have reviewed the patients History and Physical, chart, labs and discussed the procedure including the risks, benefits and alternatives for the proposed anesthesia with the patient or authorized representative who has indicated his/her understanding and acceptance.     Dental advisory given  Plan Discussed with: CRNA and Anesthesiologist  Anesthesia Plan Comments:         Anesthesia Quick Evaluation

## 2018-07-19 NOTE — Brief Op Note (Signed)
07/19/2018  5:04 PM  PATIENT:  [name]  85 y.o. female  PRE-OPERATIVE DIAGNOSIS:  Endopthalmitis right eye  POST-OPERATIVE DIAGNOSIS:  Endopthalmitis right eye  PROCEDURE:  Procedure(s): VITRECTOMY WITH VITREOUS BIPOSY, INJECTION OF INTRAVITREAL ANTIVIRAL AND ANTIBIOTICS & ENDOLASER (Right)  SURGEON:  Surgeon(s) and Role:    * Jalene Mullet, MD - Primary  PHYSICIAN ASSISTANT:   ASSISTANTS: none   ANESTHESIA:   none  EBL:  0 mL   BLOOD ADMINISTERED:none  DRAINS: none   LOCAL MEDICATIONS USED:  MARCAINE    and LIDOCAINE   SPECIMEN:  VITREOUS BIOPSY  DISPOSITION OF SPECIMEN:  TO MICROBIOLOGY  COUNTS:  YES  TOURNIQUET:  * No tourniquets in log *  DICTATION: .Note written in EPIC  PLAN OF CARE: Discharge to home after PACU  PATIENT DISPOSITION:  PACU - hemodynamically stable.   Delay start of Pharmacological VTE agent (>24hrs) due to surgical blood loss or risk of bleeding: not applicable

## 2018-07-19 NOTE — Progress Notes (Signed)
Call patient's son Tamme Mozingo [phone] when patient is ready for discharge.

## 2018-07-19 NOTE — H&P (Signed)
  Date of examination:  07/19/18  Indication for surgery:  vitritis - endophthalmitis right eye  Pertinent past medical history:  Past Medical History:  Diagnosis Date  . Arrhythmia    chronic atrial fib  . Atrial fibrillation, chronic   . Chronic anticoagulation   . Chronic atrial fibrillation 01/28/2017  . Chronic venous insufficiency 01/28/2017  . Clavicle fracture 11/20/2017  . Complication of anesthesia    " difficult waking "  . Long term current use of anticoagulant therapy 01/28/2017  . Osteoarthritis   . Presence of permanent cardiac pacemaker 01/28/2017   Original implant 1992 [employer] 431-04 Generator change 2001 and 2011.  Medtronic generator.   . SSS (sick sinus syndrome) ([place])   . Thyroid disease    hypothyroidism    Pertinent ocular history:  recent onset of blurry vision right eye  Pertinent family history: History reviewed. No pertinent family history.  General:  Healthy appearing patient in no distress.     Eyes:    Acuity OD 20/60    External: Within normal limits    Anterior segment: Within normal limits       Fundus: peripheral perivascular sheathing/inflammation, vitritis right eye   Impression:  Vitrititis, endophthalmitis right eye  Plan: Vitrectomy, biopsy, injeciton of antiviral/antibiotics and endolaser right eye  Royston Cowper

## 2018-07-19 NOTE — Discharge Instructions (Addendum)
DO NOT SLEEP ON BACK, THE EYE PRESSURE CAN GO UP AND CAUSE VISION LOSS   SLEEP ON SIDE WITH NOSE TO PILLOW  DURING DAY KEEP UPRIGHT 

## 2018-07-19 NOTE — Transfer of Care (Signed)
Immediate Anesthesia Transfer of Care Note  Patient: [name]  Procedure(s) Performed: VITRECTOMY WITH VITREOUS BIPOSY & ENDOLASER (Right Eye)  Patient Location: PACU  Anesthesia Type:MAC  Level of Consciousness: awake, alert  and oriented  Airway & Oxygen Therapy: Patient Spontanous Breathing and Patient connected to nasal cannula oxygen  Post-op Assessment: Report given to RN and Post -op Vital signs reviewed and stable  Post vital signs: Reviewed and stable  Last Vitals:  Vitals Value Taken Time  BP    Temp    Pulse    Resp    SpO2      Last Pain:  Vitals:   07/19/18 1511  TempSrc:   PainSc: 0-No pain      Patients Stated Pain Goal: 3 (33/53/31 7409)  Complications: No apparent anesthesia complications

## 2018-07-20 ENCOUNTER — Encounter (HOSPITAL_COMMUNITY): Payer: Self-pay | Admitting: Ophthalmology

## 2018-07-20 MED FILL — Vancomycin HCl For IV Soln 1 GM (Base Equivalent): INTRAOCULAR | Qty: 0.1 | Status: AC

## 2018-07-20 NOTE — Anesthesia Postprocedure Evaluation (Signed)
Anesthesia Post Note  Patient: [name]  Procedure(s) Performed: VITRECTOMY WITH VITREOUS BIPOSY & ENDOLASER (Right Eye)     Patient location during evaluation: PACU Anesthesia Type: MAC Level of consciousness: awake and alert Pain management: pain level controlled Vital Signs Assessment: post-procedure vital signs reviewed and stable Respiratory status: spontaneous breathing, nonlabored ventilation and respiratory function stable Cardiovascular status: stable and blood pressure returned to baseline Anesthetic complications: no    Last Vitals:  Vitals:   07/19/18 1745 07/19/18 1811  BP: (!) 138/59 (!) 152/62  Pulse:  60  Resp:  19  Temp: 36.4 C   SpO2:  97%    Last Pain:  Vitals:   07/19/18 1745  TempSrc:   PainSc: 0-No pain   Pain Goal: Patients Stated Pain Goal: 3 (07/19/18 1511)                 Audry Pili

## 2018-07-21 LAB — BLOOD CULTURE ID PANEL (REFLEXED)

## 2018-07-22 LAB — CULTURE, BLOOD (ROUTINE X 2): Special Requests: ADEQUATE

## 2018-07-22 LAB — EYE CULTURE
Culture: NO GROWTH
Culture: NO GROWTH

## 2018-07-23 LAB — HSV CULTURE AND TYPING

## 2018-07-24 LAB — CULTURE, BLOOD (ROUTINE X 2)
Culture: NO GROWTH
Special Requests: ADEQUATE

## 2018-07-25 LAB — VARICELLA-ZOSTER BY PCR

## 2018-07-27 DIAGNOSIS — H34831 Tributary (branch) retinal vein occlusion, right eye, with macular edema: Secondary | ICD-10-CM | POA: Diagnosis not present

## 2018-08-01 ENCOUNTER — Other Ambulatory Visit: Payer: Self-pay

## 2018-08-01 ENCOUNTER — Ambulatory Visit (INDEPENDENT_AMBULATORY_CARE_PROVIDER_SITE_OTHER): Payer: Medicare Other | Admitting: *Deleted

## 2018-08-01 DIAGNOSIS — I495 Sick sinus syndrome: Secondary | ICD-10-CM | POA: Diagnosis not present

## 2018-08-01 DIAGNOSIS — R001 Bradycardia, unspecified: Secondary | ICD-10-CM

## 2018-08-02 ENCOUNTER — Telehealth: Payer: Self-pay

## 2018-08-02 LAB — CUP PACEART REMOTE DEVICE CHECK
Battery Impedance: 552 Ohm
Battery Remaining Longevity: 41 mo
Battery Voltage: 2.77 V
Brady Statistic RV Percent Paced: 96 %
Date Time Interrogation Session: 20200513125518
Implantable Lead Implant Date: 20181114
Implantable Lead Location: 753860
Implantable Pulse Generator Implant Date: 20181114
Lead Channel Impedance Value: 0 Ohm
Lead Channel Impedance Value: 174 Ohm
Lead Channel Setting Pacing Amplitude: 2.5 V
Lead Channel Setting Pacing Pulse Width: 1 ms
Lead Channel Setting Sensing Sensitivity: 2 mV

## 2018-08-02 NOTE — Telephone Encounter (Signed)
Spoke with patient to remind of missed remote transmission 

## 2018-08-04 LAB — MISC LABCORP TEST (SEND OUT)
Labcorp test code: 8243
Labcorp test code: 8243

## 2018-08-09 ENCOUNTER — Telehealth: Payer: Self-pay

## 2018-08-09 NOTE — Telephone Encounter (Signed)

## 2018-08-10 ENCOUNTER — Ambulatory Visit (INDEPENDENT_AMBULATORY_CARE_PROVIDER_SITE_OTHER): Payer: Medicare Other | Admitting: Pharmacist

## 2018-08-10 ENCOUNTER — Other Ambulatory Visit: Payer: Self-pay

## 2018-08-10 DIAGNOSIS — Z5181 Encounter for therapeutic drug level monitoring: Secondary | ICD-10-CM

## 2018-08-10 DIAGNOSIS — I482 Chronic atrial fibrillation, unspecified: Secondary | ICD-10-CM

## 2018-08-10 LAB — POCT INR: INR: 3.7 — AB (ref 2.0–3.0)

## 2018-08-16 NOTE — Progress Notes (Signed)
Remote pacemaker transmission.   

## 2018-08-17 DIAGNOSIS — M19041 Primary osteoarthritis, right hand: Secondary | ICD-10-CM | POA: Insufficient documentation

## 2018-08-17 DIAGNOSIS — M25531 Pain in right wrist: Secondary | ICD-10-CM | POA: Diagnosis not present

## 2018-08-17 DIAGNOSIS — M13841 Other specified arthritis, right hand: Secondary | ICD-10-CM | POA: Diagnosis not present

## 2018-08-17 DIAGNOSIS — G5603 Carpal tunnel syndrome, bilateral upper limbs: Secondary | ICD-10-CM | POA: Diagnosis not present

## 2018-08-27 ENCOUNTER — Other Ambulatory Visit: Payer: Self-pay | Admitting: Internal Medicine

## 2018-08-30 DIAGNOSIS — H34831 Tributary (branch) retinal vein occlusion, right eye, with macular edema: Secondary | ICD-10-CM | POA: Diagnosis not present

## 2018-08-31 ENCOUNTER — Telehealth: Payer: Self-pay

## 2018-08-31 NOTE — Telephone Encounter (Signed)

## 2018-09-06 DIAGNOSIS — I10 Essential (primary) hypertension: Secondary | ICD-10-CM | POA: Diagnosis not present

## 2018-09-06 DIAGNOSIS — E038 Other specified hypothyroidism: Secondary | ICD-10-CM | POA: Diagnosis not present

## 2018-09-06 DIAGNOSIS — I480 Paroxysmal atrial fibrillation: Secondary | ICD-10-CM | POA: Diagnosis not present

## 2018-09-07 ENCOUNTER — Ambulatory Visit (INDEPENDENT_AMBULATORY_CARE_PROVIDER_SITE_OTHER): Payer: Medicare Other | Admitting: *Deleted

## 2018-09-07 ENCOUNTER — Other Ambulatory Visit: Payer: Self-pay

## 2018-09-07 DIAGNOSIS — I482 Chronic atrial fibrillation, unspecified: Secondary | ICD-10-CM | POA: Diagnosis not present

## 2018-09-07 DIAGNOSIS — I10 Essential (primary) hypertension: Secondary | ICD-10-CM | POA: Diagnosis not present

## 2018-09-07 DIAGNOSIS — R82998 Other abnormal findings in urine: Secondary | ICD-10-CM | POA: Diagnosis not present

## 2018-09-07 DIAGNOSIS — Z5181 Encounter for therapeutic drug level monitoring: Secondary | ICD-10-CM | POA: Diagnosis not present

## 2018-09-07 LAB — POCT INR: INR: 3.2 — AB (ref 2.0–3.0)

## 2018-09-07 NOTE — Patient Instructions (Signed)
Description   Hold coumadin today, then change dose to 1/2 a tablet (2.5 mg) daily, except for 1 tablet (5mg ) on Tuesday, Thursday and Saturday.  Recheck INR in 3 weeks. Call coumadin clinic for any questions, upcoming procedures or changes in medications. #  [phone], main # [phone]

## 2018-09-18 ENCOUNTER — Telehealth: Payer: Self-pay

## 2018-09-18 NOTE — Telephone Encounter (Signed)

## 2018-09-25 ENCOUNTER — Ambulatory Visit (INDEPENDENT_AMBULATORY_CARE_PROVIDER_SITE_OTHER): Payer: Medicare Other | Admitting: *Deleted

## 2018-09-25 ENCOUNTER — Other Ambulatory Visit: Payer: Self-pay

## 2018-09-25 DIAGNOSIS — I482 Chronic atrial fibrillation, unspecified: Secondary | ICD-10-CM | POA: Diagnosis not present

## 2018-09-25 DIAGNOSIS — Z5181 Encounter for therapeutic drug level monitoring: Secondary | ICD-10-CM | POA: Diagnosis not present

## 2018-09-25 LAB — POCT INR: INR: 3.9 — AB (ref 2.0–3.0)

## 2018-09-25 NOTE — Patient Instructions (Signed)
Description   Hold coumadin today, then change dose to 1/2 a tablet (2.5 mg) daily, except for 1 tablet (5mg ) on Tuesday and Saturday.  Recheck INR in 2 weeks. Call coumadin clinic for any questions, upcoming procedures or changes in medications. #  [phone], main # [phone]

## 2018-09-27 DIAGNOSIS — L298 Other pruritus: Secondary | ICD-10-CM | POA: Diagnosis not present

## 2018-09-27 DIAGNOSIS — Z7901 Long term (current) use of anticoagulants: Secondary | ICD-10-CM | POA: Diagnosis not present

## 2018-09-27 DIAGNOSIS — I10 Essential (primary) hypertension: Secondary | ICD-10-CM | POA: Diagnosis not present

## 2018-09-27 DIAGNOSIS — L237 Allergic contact dermatitis due to plants, except food: Secondary | ICD-10-CM | POA: Diagnosis not present

## 2018-09-27 DIAGNOSIS — I480 Paroxysmal atrial fibrillation: Secondary | ICD-10-CM | POA: Diagnosis not present

## 2018-10-05 ENCOUNTER — Telehealth: Payer: Self-pay

## 2018-10-05 NOTE — Telephone Encounter (Signed)

## 2018-10-09 ENCOUNTER — Ambulatory Visit (INDEPENDENT_AMBULATORY_CARE_PROVIDER_SITE_OTHER): Payer: Medicare Other | Admitting: *Deleted

## 2018-10-09 ENCOUNTER — Other Ambulatory Visit: Payer: Self-pay

## 2018-10-09 DIAGNOSIS — I482 Chronic atrial fibrillation, unspecified: Secondary | ICD-10-CM

## 2018-10-09 DIAGNOSIS — Z5181 Encounter for therapeutic drug level monitoring: Secondary | ICD-10-CM | POA: Diagnosis not present

## 2018-10-09 LAB — POCT INR: INR: 3 (ref 2.0–3.0)

## 2018-10-09 NOTE — Patient Instructions (Addendum)
Description   Continue taking 1/2 tablet (2.5 mg) daily, except for 1 tablet (5mg ) on Tuesday and Saturday.  Recheck INR in 3 weeks. Call coumadin clinic for any questions, upcoming procedures or changes in medications. #  [phone], main # [phone]

## 2018-10-16 DIAGNOSIS — E039 Hypothyroidism, unspecified: Secondary | ICD-10-CM | POA: Diagnosis not present

## 2018-10-16 DIAGNOSIS — M1712 Unilateral primary osteoarthritis, left knee: Secondary | ICD-10-CM | POA: Diagnosis not present

## 2018-10-16 DIAGNOSIS — Z1339 Encounter for screening examination for other mental health and behavioral disorders: Secondary | ICD-10-CM | POA: Diagnosis not present

## 2018-10-16 DIAGNOSIS — Z0000 Encounter for general adult medical examination without abnormal findings: Secondary | ICD-10-CM | POA: Diagnosis not present

## 2018-10-16 DIAGNOSIS — M25551 Pain in right hip: Secondary | ICD-10-CM | POA: Diagnosis not present

## 2018-10-16 DIAGNOSIS — E785 Hyperlipidemia, unspecified: Secondary | ICD-10-CM | POA: Diagnosis not present

## 2018-10-16 DIAGNOSIS — G629 Polyneuropathy, unspecified: Secondary | ICD-10-CM | POA: Diagnosis not present

## 2018-10-16 DIAGNOSIS — Z950 Presence of cardiac pacemaker: Secondary | ICD-10-CM | POA: Diagnosis not present

## 2018-10-16 DIAGNOSIS — Z7901 Long term (current) use of anticoagulants: Secondary | ICD-10-CM | POA: Diagnosis not present

## 2018-10-16 DIAGNOSIS — I872 Venous insufficiency (chronic) (peripheral): Secondary | ICD-10-CM | POA: Diagnosis not present

## 2018-10-16 DIAGNOSIS — I10 Essential (primary) hypertension: Secondary | ICD-10-CM | POA: Diagnosis not present

## 2018-10-16 DIAGNOSIS — Z1331 Encounter for screening for depression: Secondary | ICD-10-CM | POA: Diagnosis not present

## 2018-10-16 DIAGNOSIS — I739 Peripheral vascular disease, unspecified: Secondary | ICD-10-CM | POA: Diagnosis not present

## 2018-10-17 DIAGNOSIS — M13841 Other specified arthritis, right hand: Secondary | ICD-10-CM | POA: Diagnosis not present

## 2018-10-17 DIAGNOSIS — G5603 Carpal tunnel syndrome, bilateral upper limbs: Secondary | ICD-10-CM | POA: Diagnosis not present

## 2018-10-31 ENCOUNTER — Ambulatory Visit (INDEPENDENT_AMBULATORY_CARE_PROVIDER_SITE_OTHER): Payer: Medicare Other | Admitting: *Deleted

## 2018-10-31 DIAGNOSIS — I495 Sick sinus syndrome: Secondary | ICD-10-CM | POA: Diagnosis not present

## 2018-11-02 LAB — CUP PACEART REMOTE DEVICE CHECK
Battery Impedance: 779 Ohm
Battery Remaining Longevity: 34 mo
Battery Voltage: 2.75 V
Brady Statistic RV Percent Paced: 97 %
Date Time Interrogation Session: 20200812184023
Implantable Lead Implant Date: 20181114
Implantable Lead Location: 753860
Implantable Pulse Generator Implant Date: 20181114
Lead Channel Impedance Value: 0 Ohm
Lead Channel Impedance Value: 169 Ohm
Lead Channel Setting Pacing Amplitude: 2.5 V
Lead Channel Setting Pacing Pulse Width: 1 ms
Lead Channel Setting Sensing Sensitivity: 2 mV

## 2018-11-09 ENCOUNTER — Encounter: Payer: Self-pay | Admitting: Cardiology

## 2018-11-09 NOTE — Progress Notes (Signed)
Remote pacemaker transmission.   

## 2018-11-16 DIAGNOSIS — Z23 Encounter for immunization: Secondary | ICD-10-CM | POA: Diagnosis not present

## 2018-11-30 DIAGNOSIS — G5603 Carpal tunnel syndrome, bilateral upper limbs: Secondary | ICD-10-CM | POA: Diagnosis not present

## 2018-12-11 ENCOUNTER — Encounter (HOSPITAL_COMMUNITY): Payer: Self-pay | Admitting: Emergency Medicine

## 2018-12-11 ENCOUNTER — Observation Stay (HOSPITAL_COMMUNITY): Payer: Medicare Other

## 2018-12-11 ENCOUNTER — Observation Stay (HOSPITAL_COMMUNITY)
Admission: EM | Admit: 2018-12-11 | Discharge: 2018-12-12 | Disposition: A | Discharge status: Home / Self Care | Payer: Medicare Other | Attending: Internal Medicine | Admitting: Internal Medicine

## 2018-12-11 ENCOUNTER — Emergency Department (HOSPITAL_COMMUNITY): Payer: Medicare Other

## 2018-12-11 ENCOUNTER — Other Ambulatory Visit: Payer: Self-pay

## 2018-12-11 DIAGNOSIS — Z79890 Hormone replacement therapy (postmenopausal): Secondary | ICD-10-CM | POA: Insufficient documentation

## 2018-12-11 DIAGNOSIS — Z20828 Contact with and (suspected) exposure to other viral communicable diseases: Secondary | ICD-10-CM | POA: Insufficient documentation

## 2018-12-11 DIAGNOSIS — R000 Tachycardia, unspecified: Secondary | ICD-10-CM | POA: Diagnosis not present

## 2018-12-11 DIAGNOSIS — Z87891 Personal history of nicotine dependence: Secondary | ICD-10-CM | POA: Diagnosis not present

## 2018-12-11 DIAGNOSIS — I872 Venous insufficiency (chronic) (peripheral): Secondary | ICD-10-CM | POA: Insufficient documentation

## 2018-12-11 DIAGNOSIS — I10 Essential (primary) hypertension: Secondary | ICD-10-CM | POA: Diagnosis not present

## 2018-12-11 DIAGNOSIS — R471 Dysarthria and anarthria: Principal | ICD-10-CM | POA: Diagnosis present

## 2018-12-11 DIAGNOSIS — R4781 Slurred speech: Secondary | ICD-10-CM | POA: Diagnosis not present

## 2018-12-11 DIAGNOSIS — I482 Chronic atrial fibrillation, unspecified: Secondary | ICD-10-CM | POA: Diagnosis not present

## 2018-12-11 DIAGNOSIS — Z950 Presence of cardiac pacemaker: Secondary | ICD-10-CM | POA: Insufficient documentation

## 2018-12-11 DIAGNOSIS — M1990 Unspecified osteoarthritis, unspecified site: Secondary | ICD-10-CM | POA: Diagnosis not present

## 2018-12-11 DIAGNOSIS — Z79899 Other long term (current) drug therapy: Secondary | ICD-10-CM | POA: Insufficient documentation

## 2018-12-11 DIAGNOSIS — Z7901 Long term (current) use of anticoagulants: Secondary | ICD-10-CM | POA: Diagnosis not present

## 2018-12-11 DIAGNOSIS — E039 Hypothyroidism, unspecified: Secondary | ICD-10-CM | POA: Diagnosis not present

## 2018-12-11 DIAGNOSIS — R410 Disorientation, unspecified: Secondary | ICD-10-CM | POA: Diagnosis not present

## 2018-12-11 DIAGNOSIS — I420 Dilated cardiomyopathy: Secondary | ICD-10-CM | POA: Insufficient documentation

## 2018-12-11 DIAGNOSIS — R479 Unspecified speech disturbances: Secondary | ICD-10-CM

## 2018-12-11 DIAGNOSIS — Z791 Long term (current) use of non-steroidal anti-inflammatories (NSAID): Secondary | ICD-10-CM | POA: Insufficient documentation

## 2018-12-11 DIAGNOSIS — Z880 Allergy status to penicillin: Secondary | ICD-10-CM | POA: Insufficient documentation

## 2018-12-11 DIAGNOSIS — I495 Sick sinus syndrome: Secondary | ICD-10-CM | POA: Diagnosis not present

## 2018-12-11 DIAGNOSIS — I6521 Occlusion and stenosis of right carotid artery: Secondary | ICD-10-CM | POA: Insufficient documentation

## 2018-12-11 DIAGNOSIS — I6523 Occlusion and stenosis of bilateral carotid arteries: Secondary | ICD-10-CM | POA: Diagnosis not present

## 2018-12-11 DIAGNOSIS — R51 Headache: Secondary | ICD-10-CM | POA: Diagnosis not present

## 2018-12-11 DIAGNOSIS — Z03818 Encounter for observation for suspected exposure to other biological agents ruled out: Secondary | ICD-10-CM | POA: Diagnosis not present

## 2018-12-11 DIAGNOSIS — Z888 Allergy status to other drugs, medicaments and biological substances status: Secondary | ICD-10-CM | POA: Diagnosis not present

## 2018-12-11 DIAGNOSIS — R4789 Other speech disturbances: Secondary | ICD-10-CM | POA: Diagnosis not present

## 2018-12-11 DIAGNOSIS — G459 Transient cerebral ischemic attack, unspecified: Secondary | ICD-10-CM | POA: Diagnosis not present

## 2018-12-11 DIAGNOSIS — R42 Dizziness and giddiness: Secondary | ICD-10-CM | POA: Diagnosis not present

## 2018-12-11 LAB — DIFFERENTIAL
Abs Immature Granulocytes: 0.01 10*3/uL (ref 0.00–0.07)
Basophils Absolute: 0.1 10*3/uL (ref 0.0–0.1)
Basophils Relative: 1 %
Eosinophils Absolute: 0.2 10*3/uL (ref 0.0–0.5)
Eosinophils Relative: 4 %
Immature Granulocytes: 0 %
Lymphocytes Relative: 16 %
Lymphs Abs: 1 10*3/uL (ref 0.7–4.0)
Monocytes Absolute: 0.8 10*3/uL (ref 0.1–1.0)
Monocytes Relative: 14 %
Neutro Abs: 3.9 10*3/uL (ref 1.7–7.7)
Neutrophils Relative %: 65 %

## 2018-12-11 LAB — CBC
HCT: 41.4 % (ref 36.0–46.0)
Hemoglobin: 13 g/dL (ref 12.0–15.0)
MCH: 29.5 pg (ref 26.0–34.0)
MCHC: 31.4 g/dL (ref 30.0–36.0)
MCV: 93.9 fL (ref 80.0–100.0)
Platelets: 240 10*3/uL (ref 150–400)
RBC: 4.41 MIL/uL (ref 3.87–5.11)
RDW: 13.4 % (ref 11.5–15.5)
WBC: 6 10*3/uL (ref 4.0–10.5)
nRBC: 0 % (ref 0.0–0.2)

## 2018-12-11 LAB — COMPREHENSIVE METABOLIC PANEL
ALT: 16 U/L (ref 0–44)
AST: 19 U/L (ref 15–41)
Albumin: 4.1 g/dL (ref 3.5–5.0)
Alkaline Phosphatase: 65 U/L (ref 38–126)
Anion gap: 10 (ref 5–15)
BUN: 19 mg/dL (ref 8–23)
CO2: 23 mmol/L (ref 22–32)
Calcium: 9.4 mg/dL (ref 8.9–10.3)
Chloride: 105 mmol/L (ref 98–111)
Creatinine, Ser: 0.93 mg/dL (ref 0.44–1.00)
GFR calc Af Amer: >60 mL/min (ref >60)
GFR calc non Af Amer: 56 mL/min — ABNORMAL LOW (ref >60)
Glucose, Bld: 109 mg/dL — ABNORMAL HIGH (ref 70–99)
Potassium: 4.4 mmol/L (ref 3.5–5.1)
Sodium: 138 mmol/L (ref 135–145)
Total Bilirubin: 0.5 mg/dL (ref 0.3–1.2)
Total Protein: 6.9 g/dL (ref 6.5–8.1)

## 2018-12-11 LAB — PROTIME-INR
INR: 1.8 — ABNORMAL HIGH (ref 0.8–1.2)
Prothrombin Time: 20.6 seconds — ABNORMAL HIGH (ref 11.4–15.2)

## 2018-12-11 LAB — APTT: aPTT: 39 seconds — ABNORMAL HIGH (ref 24–36)

## 2018-12-11 MED ORDER — OXYCODONE-ACETAMINOPHEN 5-325 MG PO TABS: 1.0000 | ORAL_TABLET | ORAL | Status: DC | PRN

## 2018-12-11 MED ORDER — WARFARIN - PHARMACIST DOSING INPATIENT: Freq: Every day | Status: DC

## 2018-12-11 MED ORDER — ACETAMINOPHEN 160 MG/5ML PO SOLN: 650.0000 mg | ORAL | Status: DC | PRN

## 2018-12-11 MED ORDER — DIGOXIN 125 MCG PO TABS
0.1250 mg | ORAL_TABLET | Freq: Every day | ORAL | Status: DC
  Administered 2018-12-12: 0.125 mg via ORAL
  Filled 2018-12-11: qty 1

## 2018-12-11 MED ORDER — SENNOSIDES-DOCUSATE SODIUM 8.6-50 MG PO TABS: 2.0000 | ORAL_TABLET | Freq: Every evening | ORAL | Status: DC | PRN

## 2018-12-11 MED ORDER — STROKE: EARLY STAGES OF RECOVERY BOOK: Freq: Once | Status: DC

## 2018-12-11 MED ORDER — ACETAMINOPHEN 325 MG PO TABS: 650.0000 mg | ORAL_TABLET | ORAL | Status: DC | PRN

## 2018-12-11 MED ORDER — POLYETHYLENE GLYCOL 3350 17 G PO PACK: 17.0000 g | PACK | Freq: Every day | ORAL | Status: DC | PRN

## 2018-12-11 MED ORDER — LEVOTHYROXINE SODIUM 75 MCG PO TABS
150.0000 ug | ORAL_TABLET | Freq: Every day | ORAL | Status: DC
  Administered 2018-12-12: 150 ug via ORAL
  Filled 2018-12-11: qty 2

## 2018-12-11 MED ORDER — ACETAMINOPHEN 650 MG RE SUPP: 650.0000 mg | RECTAL | Status: DC | PRN

## 2018-12-11 MED ORDER — WARFARIN SODIUM 5 MG PO TABS
5.0000 mg | ORAL_TABLET | Freq: Once | ORAL | Status: DC
  Filled 2018-12-11: qty 1

## 2018-12-11 MED ORDER — ASPIRIN 325 MG PO TABS: 325.0000 mg | ORAL_TABLET | Freq: Once | ORAL | Status: DC

## 2018-12-11 MED ORDER — IOHEXOL 350 MG/ML SOLN
75.0000 mL | Freq: Once | INTRAVENOUS | Status: AC | PRN
  Administered 2018-12-11: 22:00:00 75 mL via INTRAVENOUS

## 2018-12-11 MED ORDER — SODIUM CHLORIDE 0.9% FLUSH: 3.0000 mL | Freq: Once | INTRAVENOUS | Status: DC

## 2018-12-11 MED ORDER — SERTRALINE HCL 25 MG PO TABS: 25.0000 mg | ORAL_TABLET | Freq: Every day | ORAL | Status: DC

## 2018-12-11 NOTE — ED Triage Notes (Signed)
Pt brought in by [facility],  Caregiver reported pt had 2 minutes of aphasia starting at 1120, also reported some confusion. EMS reports symptoms resolved upon their arrival. Pt has pacemaker, and currently takes coumadin.

## 2018-12-11 NOTE — Progress Notes (Signed)
ANTICOAGULATION CONSULT NOTE - Initial Consult  Pharmacy Consult for Warfarin Indication: atrial fibrillation  Allergies  Allergen Reactions  . Adhesive [Tape] Other (See Comments)    TAPE WILL TEAR THE SKIN!!!!  . Penicillins Hives and Rash    Has patient had a PCN reaction causing immediate rash, facial/tongue/throat swelling, SOB or lightheadedness with hypotension: Yes Has patient had a PCN reaction causing severe rash involving mucus membranes or skin necrosis: Yes Has patient had a PCN reaction that required hospitalization: Was IN the hosp Has patient had a PCN reaction occurring within the last 10 years: No If all of the above answers are "NO", then may proceed with Cephalosporin use.     Vital Signs: Temp: 98.1 F (36.7 C) (09/21 1510) Temp Source: Oral (09/21 1510) BP: 165/63 (09/21 1822) Pulse Rate: 63 (09/21 1822)  Labs: Recent Labs    12/11/18 1231  HGB 13.0  HCT 41.4  PLT 240  APTT 39*  LABPROT 20.6*  INR 1.8*  CREATININE 0.93    CrCl cannot be calculated (Unknown ideal weight.).   Medical History: Past Medical History:  Diagnosis Date  . Arrhythmia    chronic atrial fib  . Atrial fibrillation, chronic   . Chronic anticoagulation   . Chronic atrial fibrillation 01/28/2017  . Chronic venous insufficiency 01/28/2017  . Clavicle fracture 11/20/2017  . Complication of anesthesia    " difficult waking "  . Long term current use of anticoagulant therapy 01/28/2017  . Osteoarthritis   . Presence of permanent cardiac pacemaker 01/28/2017   Original implant 1992 [employer] 431-04 Generator change 2001 and 2011.  Medtronic generator.   . SSS (sick sinus syndrome) ([place])   . Thyroid disease    hypothyroidism    Assessment: [name] is a 85 y.o. female with medical history significant of atrial fibrillation on Coumadin, sick sinus syndrome status post pacemaker in place, hypothyroidism, osteoarthritis came to the hospital with complaints  of dysarthria.  Her caretaker noticed this morning she was having some speech difficulty as they were leaving to go to her Coumadin clinic.  Ended up calling EMS, dysarthria L lasted for about 10 minutes.  Patient denies having similar symptoms in the past.  By the time she came to the ER her symptoms resolved.  Pharmacy consulted to start Coumadin. INR on admission 1.8.  Most recent Coumadin clinic RX: Warfarin 5 mg (5 mg x 1) every Tue, Sat; 2.5 mg (5 mg x 0.5) all other days  Goal of Therapy:  INR 2-3 Monitor platelets by anticoagulation protocol: Yes   Plan:  Warfarin 5 mg PO x 1 tonight Monitor INR and CBC daily  Alanda Slim, PharmD, [facility] Clinical Pharmacist Please see AMION for all Pharmacists' Contact Phone Numbers 12/11/2018, 6:30 PM

## 2018-12-11 NOTE — ED Notes (Signed)
Patient transported to CT 

## 2018-12-11 NOTE — H&P (Signed)
History and Physical    EHRIN SHAHEED [id number] DOB: 1931/01/09 DOA: 12/11/2018  PCP: Burnard Bunting, MD Patient coming from: Home  Chief Complaint: Speech impairment.   HPI: [name] is a 85 y.o. female with medical history significant of atrial fibrillation on Coumadin, sick sinus syndrome status post pacemaker in place, hypothyroidism, osteoarthritis came to the hospital with complaints of dysarthria.  Patient lives at home alone and gets around-the-clock help from caretaker.  Her caretaker noticed this morning she was having some speech difficulty as they were leaving to go to her Coumadin clinic.  Ended up calling EMS, dysarthria L lasted for about 10 minutes.  Patient denies having similar symptoms in the past.  By the time she came to the ER her symptoms resolved. In the ER her labs were unremarkable.  Vital signs showed slightly elevated blood pressure.  CT of the head was negative.  Medical team was requested to admit the patient.  Unable to get MRI has she has pacemaker in place which is not compatible.  Review of Systems: As per HPI otherwise 10 point review of systems negative.  Review of Systems Otherwise negative except as per HPI, including: General: Denies fever, chills, night sweats or unintended weight loss. Resp: Denies cough, wheezing, shortness of breath. Cardiac: Denies chest pain, palpitations, orthopnea, paroxysmal nocturnal dyspnea. GI: Denies abdominal pain, nausea, vomiting, diarrhea or constipation GU: Denies dysuria, frequency, hesitancy or incontinence MS: Denies muscle aches, joint pain or swelling Neuro: Denies headache, neurologic deficits (focal weakness, numbness, tingling), abnormal gait Psych: Denies anxiety, depression, SI/HI/AVH Skin: Denies new rashes or lesions ID: Denies sick contacts, exotic exposures, travel  Past Medical History:  Diagnosis Date  . Arrhythmia    chronic atrial fib  . Atrial fibrillation, chronic   .  Chronic anticoagulation   . Chronic atrial fibrillation 01/28/2017  . Chronic venous insufficiency 01/28/2017  . Clavicle fracture 11/20/2017  . Complication of anesthesia    " difficult waking "  . Long term current use of anticoagulant therapy 01/28/2017  . Osteoarthritis   . Presence of permanent cardiac pacemaker 01/28/2017   Original implant 1992 Lead [employer]med[employer] 431-04 Generator change 2001 and 2011.  Medtronic generator.   . SSS (sick sinus syndrome) ([place])   . Thyroid disease    hypothyroidism    Past Surgical History:  Procedure Laterality Date  . ABDOMINAL HYSTERECTOMY    . INSERT / REPLACE / REMOVE PACEMAKER     generator change 2011 medtronic sigma  . KNEE SURGERY    . PACEMAKER INSERTION    . PARS PLANA VITRECTOMY Right 07/19/2018   Procedure: VITRECTOMY WITH VITREOUS BIPOSY & ENDOLASER;  Surgeon: Jalene Mullet, MD;  Location: [place];  Service: Ophthalmology;  Laterality: Right;  . PPM GENERATOR CHANGEOUT N/A 02/02/2017   Procedure: PPM GENERATOR CHANGEOUT;  Surgeon: Deboraha Sprang, MD;  Location: [place] [facility];  Service: Cardiovascular;  Laterality: N/A;  . REPLACEMENT TOTAL KNEE BILATERAL    . VARICOSE VEIN SURGERY      SOCIAL HISTORY:  reports that she has quit smoking. She has never used smokeless tobacco. She reports that she does not drink alcohol or use drugs.  Allergies  Allergen Reactions  . Penicillins Hives and Rash    Has patient had a PCN reaction causing immediate rash, facial/tongue/throat swelling, SOB or lightheadedness with hypotension: unkn Has patient had a PCN reaction causing severe rash involving mucus membranes or skin necrosis: unkn Has patient had a PCN reaction that  required hospitalization: unkn Has patient had a PCN reaction occurring within the last 10 years: unkn If all of the above answers are "NO", then may proceed with Cephalosporin use.     FAMILY HISTORY: History reviewed. No pertinent family history.   Prior to  Admission medications   Medication Sig Start Date End Date Taking? Authorizing Provider  acetaminophen (TYLENOL) 325 MG tablet Take 325 mg by mouth 3 (three) times daily as needed (pain.).    [provider]  acetaminophen (TYLENOL) 325 MG tablet Take 650 mg by mouth 3 (three) times daily.    [provider]  amLODipine (NORVASC) 10 MG tablet Take 10 mg by mouth daily.    [provider]  Bepotastine Besilate (BEPREVE OP) Place 1 drop 2 (two) times daily as needed into both eyes (eye allergies).    [provider]  celecoxib (CELEBREX) 200 MG capsule Take 200 mg at bedtime by mouth.     [provider]  cholecalciferol (VITAMIN D) 1000 units tablet Take 1,000 Units daily by mouth.    [provider]  digoxin (LANOXIN) 0.125 MG tablet Take 0.125 mg by mouth daily. Hold for pulse less than 60.    [provider]  levothyroxine (SYNTHROID, LEVOTHROID) 150 MCG tablet Take 150 mcg by mouth daily before breakfast.    [provider]  loratadine (CLARITIN) 10 MG tablet Take 10 mg by mouth daily as needed for allergies.    [provider]  meclizine (ANTIVERT) 25 MG tablet Take 25 mg by mouth daily as needed for dizziness or nausea.     [provider]  methocarbamol (ROBAXIN) 500 MG tablet Take 1 tablet (500 mg total) by mouth every 8 (eight) hours as needed for muscle spasms. 11/23/17   Bonnielee Haff, MD  oxyCODONE-acetaminophen (PERCOCET/ROXICET) 5-325 MG tablet Take 1-2 tablets by mouth every 4 (four) hours as needed for severe pain. 11/23/17   Bonnielee Haff, MD  polyethylene glycol [facility] / Floria Raveling) packet Take 17 g by mouth daily. 11/24/17   Bonnielee Haff, MD  senna-docusate (SENOKOT-S) 8.6-50 MG tablet Take 1 tablet by mouth 2 (two) times daily. Patient not taking: Reported on 03/16/2018 11/23/17   Bonnielee Haff, MD  sertraline (ZOLOFT) 50 MG tablet Take 25 mg by mouth daily.    [provider]   vitamin B-12 (CYANOCOBALAMIN) 1000 MCG tablet Take 1,000 mcg daily by mouth.    [provider]  warfarin (COUMADIN) 5 MG tablet TAKE 1/2 TO 1 TABLET DAILY AS DIRECTED BY COUMADIN CLINIC Patient taking differently: Take 2.5-5 mg by mouth See admin instructions. Take 1/2 to 1 tablet daily as directed by coumadin clinic 08/28/18   Deboraha Sprang, MD    Physical Exam: Vitals:   12/11/18 1235 12/11/18 1510  BP: (!) 141/59 (!) 142/58  Pulse: 68 63  Resp: 20 18  Temp: (!) 97.5 F (36.4 C) 98.1 F (36.7 C)  TempSrc: Oral Oral  SpO2: 99% 96%      Constitutional: NAD, calm, comfortable Eyes: PERRL, lids and conjunctivae normal ENMT: Mucous membranes are moist. Posterior pharynx clear of any exudate or lesions.Normal dentition.  Neck: normal, supple, no masses, no thyromegaly Respiratory: clear to auscultation bilaterally, no wheezing, no crackles. Normal respiratory effort. No accessory muscle use.  Cardiovascular: Regular rate and rhythm, no murmurs / rubs / gallops. No extremity edema. 2+ pedal pulses. No carotid bruits.  Abdomen: no tenderness, no masses palpated. No hepatosplenomegaly. Bowel sounds positive.  Musculoskeletal: no clubbing /  cyanosis. No joint deformity upper and lower extremities. Good ROM, no contractures. Normal muscle tone.  Skin: no rashes, lesions, ulcers. No induration Neurologic: CN 2-12 grossly intact. Sensation intact, DTR normal. Strength 5/5 in all 4.  Psychiatric: Normal judgment and insight. Alert and oriented x 3. Normal mood.     Labs on Admission: I have personally reviewed following labs and imaging studies  CBC: Recent Labs  Lab 12/11/18 1231  WBC 6.0  NEUTROABS 3.9  HGB 13.0  HCT 41.4  MCV 93.9  PLT [id number]   Basic Metabolic Panel: Recent Labs  Lab 12/11/18 1231  NA 138  K 4.4  CL 105  CO2 23  GLUCOSE 109*  BUN 19  CREATININE 0.93  CALCIUM 9.4   GFR: CrCl cannot be calculated (Unknown ideal weight.). Liver Function  Tests: Recent Labs  Lab 12/11/18 1231  AST 19  ALT 16  ALKPHOS 65  BILITOT 0.5  PROT 6.9  ALBUMIN 4.1   No results for input(s): LIPASE, AMYLASE in the last 168 hours. No results for input(s): AMMONIA in the last 168 hours. Coagulation Profile: Recent Labs  Lab 12/11/18 1231  INR 1.8*   Cardiac Enzymes: No results for input(s): CKTOTAL, CKMB, CKMBINDEX, TROPONINI in the last 168 hours. BNP (last 3 results) No results for input(s): PROBNP in the last 8760 hours. HbA1C: No results for input(s): HGBA1C in the last 72 hours. CBG: No results for input(s): GLUCAP in the last 168 hours. Lipid Profile: No results for input(s): CHOL, HDL, LDLCALC, TRIG, CHOLHDL, LDLDIRECT in the last 72 hours. Thyroid Function Tests: No results for input(s): TSH, T4TOTAL, FREET4, T3FREE, THYROIDAB in the last 72 hours. Anemia Panel: No results for input(s): VITAMINB12, FOLATE, FERRITIN, TIBC, IRON, RETICCTPCT in the last 72 hours. Urine analysis:    Component Value Date/Time   COLORURINE YELLOW 11/20/2017 1215   APPEARANCEUR CLEAR 11/20/2017 1215   LABSPEC 1.031 (H) 11/20/2017 1215   PHURINE 6.0 11/20/2017 1215   GLUCOSEU NEGATIVE 11/20/2017 1215   HGBUR SMALL (A) 11/20/2017 1215   BILIRUBINUR NEGATIVE 11/20/2017 1215   KETONESUR NEGATIVE 11/20/2017 1215   PROTEINUR NEGATIVE 11/20/2017 1215   UROBILINOGEN 0.2 12/11/2010 1642   NITRITE NEGATIVE 11/20/2017 1215   LEUKOCYTESUR NEGATIVE 11/20/2017 1215   Sepsis Labs: !!!!!!!!!!!!!!!!!!!!!!!!!!!!!!!!!!!!!!!!!!!! @LABRCNTIP (procalcitonin:4,lacticidven:4) )No results found for this or any previous visit (from the past 240 hour(s)).   Radiological Exams on Admission: Ct Head Wo Contrast  Result Date: 12/11/2018 CLINICAL DATA:  Onset lightheadedness today during a bowel movement. Initial encounter. EXAM: CT HEAD WITHOUT CONTRAST TECHNIQUE: Contiguous axial images were obtained from the base of the skull through the vertex without intravenous  contrast. COMPARISON:  PET-CT scan 03/16/2018. FINDINGS: Brain: No evidence of acute infarction, hemorrhage, hydrocephalus, extra-axial collection or mass lesion/mass effect. Atrophy and chronic microvascular ischemic change again seen. Vascular: Atherosclerosis. Skull: Intact.  No focal lesion. Sinuses/Orbits: Status post cataract surgery. Other: None. IMPRESSION: No of atrophy and chronic microvascular ischemic change. Atherosclerosis. Electronically Signed   By: Inge Rise M.D.   On: 12/11/2018 16:42   All images have been reviewed by me personally.  EKG: Independently reviewed. V paced rhythm  Assessment/Plan Principal Problem:   Dysarthria Active Problems:   Long term current use of anticoagulant therapy   Accelerated hypertension   Chronic atrial fibrillation   Hypothyroidism   Dysarthria concerning for TIA, resolved -admit forTelemetry monitoring -Stroke protocol -Allow for permissive hypertension for the first 24-48h - only treat PRN if SBP >220 mmHg. Blood pressures can  be gradually normalized to SBP<140 upon discharge. -Unable to get MRI due to her pacemaker.  Will get CTA head and neck -Maintain E[place]mia.  -ASA ordered.  -Echocardiogram -Lipid Panel, TSH and A1C -Frequent neuro checks  -Risk factor modification -PT/OT eval, Speech consult  Chronic atrial fibrillation on Coumadin status post pacemaker Subtherapeutic INR, 1.8 - Daily digoxin.  Coumadin pharmacy to dose.  Hypothyroidism -Continue Synthroid.  Essential hypertension -Hold off on antihypertensives for permissive hypertension.   DVT prophylaxis: Coumadin Code Status: Full code Family Communication: [name], but no answer. No option to leave a voicemail.  Disposition Plan: To be determined Consults called: None, will consult neurology if necessary Admission status: Observation admission   Time Spent: 65 minutes.  >50% of the time was devoted to discussing the patients care, assessment,  plan and disposition with other care givers along with counseling the patient about the risks and benefits of treatment.    Masiah Lewing Arsenio Loader MD [facility]  If 7PM-7AM, please contact night-coverage [email or web address]  12/11/2018, 5:43 PM

## 2018-12-11 NOTE — ED Notes (Signed)
Dietary heart healthy tray ordered for patient.

## 2018-12-11 NOTE — ED Provider Notes (Signed)
[facility] EMERGENCY DEPARTMENT Provider Note   CSN: [id number] Arrival date & time: 12/11/18  1210     History   Chief Complaint Chief Complaint  Patient presents with  . Transient Ischemic Attack    HPI [name] is a 85 y.o. female.     HPI Patient presents after an episode of difficulty speaking.  Lasted a few minutes this morning.  States she just gone to the bathroom.  Did not feel lightheaded or dizzy.  States she had trouble getting words out and almost felt as if the wrong words were coming out.  By the time EMS got there she had gone back to normal.  No headache.  No confusion.  She has a history of atrial fibrillation and is on Coumadin.  States she missed the last testing for Coumadin level but has been consistent in taking her medicine.  Also has a pacemaker for sick sinus syndrome.  States she feels slightly weak in her legs but otherwise back to normal. Past Medical History:  Diagnosis Date  . Arrhythmia    chronic atrial fib  . Atrial fibrillation, chronic   . Chronic anticoagulation   . Chronic atrial fibrillation 01/28/2017  . Chronic venous insufficiency 01/28/2017  . Clavicle fracture 11/20/2017  . Complication of anesthesia    " difficult waking "  . Long term current use of anticoagulant therapy 01/28/2017  . Osteoarthritis   . Presence of permanent cardiac pacemaker 01/28/2017   Original implant 1992 [employer] 431-04 Generator change 2001 and 2011.  Medtronic generator.   . SSS (sick sinus syndrome) ([place])   . Thyroid disease    hypothyroidism    Patient Active Problem List   Diagnosis Date Noted  . Encounter for therapeutic drug monitoring 03/09/2018  . Malignant hypertension 01/04/2018  . Clavicle fracture 11/20/2017  . Closed fracture of clavicle 11/20/2017  . Fall   . Accelerated hypertension   . Carpal tunnel syndrome of left wrist 08/02/2017  . Carpal tunnel syndrome of right wrist 08/02/2017  .  Bradycardia 02/02/2017  . Presence of permanent cardiac pacemaker 01/28/2017  . Chronic a-fib 01/28/2017  . Long term current use of anticoagulant therapy 01/28/2017  . Chronic venous insufficiency 01/28/2017  . Chronic atrial fibrillation 01/28/2017  . Peripheral venous insufficiency 01/28/2017  . Cardiac pacemaker in situ 01/28/2017  . DOE (dyspnea on exertion) 09/22/2015  . Dyspnea on exertion 09/22/2015  . 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 05/15/2014  . After cataract of left eye not obscuring vision 05/15/2014  . Bilateral dry eyes 05/15/2014  . Dermatochalasis of eyelid 05/15/2014  . Hyperopia of both eyes with astigmatism and presbyopia 05/15/2014  . Irregular astigmatism of right eye 05/15/2014  . Mechanical complication due to intraocular lens implant 05/15/2014  . 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 Vitreous syneresis of both eyes 05/15/2014    Past Surgical History:  Procedure Laterality Date  . ABDOMINAL HYSTERECTOMY    . INSERT / REPLACE / REMOVE PACEMAKER     generator change 2011 medtronic sigma  . KNEE SURGERY    . PACEMAKER INSERTION    . PARS PLANA VITRECTOMY Right 07/19/2018   Procedure: VITRECTOMY WITH VITREOUS BIPOSY & ENDOLASER;  Surgeon: Jalene Mullet, MD;  Location: [facility];  Service: Ophthalmology;  Laterality: Right;  . PPM GENERATOR CHANGEOUT N/A 02/02/2017   Procedure: PPM GENERATOR CHANGEOUT;  Surgeon: Deboraha Sprang, MD;  Location: [facility];  Service: Cardiovascular;  Laterality: N/A;  .  REPLACEMENT TOTAL KNEE BILATERAL    . VARICOSE VEIN SURGERY       OB History   No obstetric history on file.      Home Medications    Prior to Admission medications   Medication Sig Start Date End Date Taking? Authorizing Provider  acetaminophen (TYLENOL) 325 MG tablet Take 325 mg by mouth 3 (three) times daily as needed (pain.).    [provider]  acetaminophen (TYLENOL) 325 MG tablet Take 650 mg by mouth 3 (three) times daily.     [provider]  amLODipine (NORVASC) 10 MG tablet Take 10 mg by mouth daily.    [provider]  Bepotastine Besilate (BEPREVE OP) Place 1 drop 2 (two) times daily as needed into both eyes (eye allergies).    [provider]  celecoxib (CELEBREX) 200 MG capsule Take 200 mg at bedtime by mouth.     [provider]  cholecalciferol (VITAMIN D) 1000 units tablet Take 1,000 Units daily by mouth.    [provider]  digoxin (LANOXIN) 0.125 MG tablet Take 0.125 mg by mouth daily. Hold for pulse less than 60.    [provider]  levothyroxine (SYNTHROID, LEVOTHROID) 150 MCG tablet Take 150 mcg by mouth daily before breakfast.    [provider]  loratadine (CLARITIN) 10 MG tablet Take 10 mg by mouth daily as needed for allergies.    [provider]  meclizine (ANTIVERT) 25 MG tablet Take 25 mg by mouth daily as needed for dizziness or nausea.     [provider]  methocarbamol (ROBAXIN) 500 MG tablet Take 1 tablet (500 mg total) by mouth every 8 (eight) hours as needed for muscle spasms. Patient not taking: Reported on 03/16/2018 11/23/17   Bonnielee Haff, MD  oxyCODONE-acetaminophen (PERCOCET/ROXICET) 5-325 MG tablet Take 1-2 tablets by mouth every 4 (four) hours as needed for severe pain. Patient not taking: Reported on 03/16/2018 11/23/17   Bonnielee Haff, MD  polyethylene glycol [facility] Floria Raveling) packet Take 17 g by mouth daily. Patient not taking: Reported on 03/16/2018 11/24/17   Bonnielee Haff, MD  senna-docusate (SENOKOT-S) 8.6-50 MG tablet Take 1 tablet by mouth 2 (two) times daily. Patient not taking: Reported on 03/16/2018 11/23/17   Bonnielee Haff, MD  sertraline (ZOLOFT) 50 MG tablet Take 25 mg by mouth daily.    [provider]  vitamin B-12 (CYANOCOBALAMIN) 1000 MCG tablet Take 1,000 mcg daily by mouth.    [provider]  warfarin (COUMADIN) 5 MG tablet TAKE 1/2 TO 1 TABLET DAILY AS DIRECTED  BY COUMADIN CLINIC 08/28/18   Deboraha Sprang, MD    Family History History reviewed. No pertinent family history.  Social History Social History   Tobacco Use  . Smoking status: Former Research scientist (life sciences)  . Smokeless tobacco: Never Used  Substance Use Topics  . Alcohol use: No  . Drug use: No     Allergies   Penicillins   Review of Systems Review of Systems  Constitutional: Negative for appetite change.  HENT: Negative for congestion.   Respiratory: Negative for shortness of breath.   Cardiovascular: Negative for chest pain.  Gastrointestinal: Negative for abdominal distention.  Genitourinary: Negative for flank pain.  Musculoskeletal: Negative for back pain.  Skin: Negative for rash.  Neurological: Positive for speech difficulty.  Psychiatric/Behavioral: Negative for confusion.     Physical Exam Updated Vital Signs BP (!) 142/58   Pulse 63   Temp 98.1 F (36.7 C) (Oral)  Resp 18   SpO2 96%   Physical Exam Vitals signs and nursing note reviewed.  HENT:     Head: Atraumatic.  Eyes:     Extraocular Movements: Extraocular movements intact.  Neck:     Musculoskeletal: Neck supple.  Cardiovascular:     Rate and Rhythm: Regular rhythm.  Pulmonary:     Breath sounds: No wheezing or rhonchi.     Comments: Pacemaker to right chest wall. Abdominal:     General: There is no distension.  Skin:    General: Skin is warm.     Capillary Refill: Capillary refill takes less than 2 seconds.  Neurological:     Mental Status: She is alert and oriented to person, place, and time. Mental status is at baseline.     Comments: Awake and appropriate.  Moving all extremities.  No lateralizing weakness.  Normal speech.  Good memory.  Eye movements intact.  Psychiatric:        Mood and Affect: Mood normal.      ED Treatments / Results  Labs (all labs ordered are listed, but only abnormal results are displayed) Labs Reviewed  PROTIME-INR - Abnormal; Notable for the following  components:      Result Value   Prothrombin Time 20.6 (*)    INR 1.8 (*)    All other components within normal limits  APTT - Abnormal; Notable for the following components:   aPTT 39 (*)    All other components within normal limits  COMPREHENSIVE METABOLIC PANEL - Abnormal; Notable for the following components:   Glucose, Bld 109 (*)    GFR calc non Af Amer 56 (*)    All other components within normal limits  CBC  DIFFERENTIAL  CBG MONITORING, ED    EKG EKG Interpretation  Date/Time:  Monday December 11 2018 12:41:46 EDT Ventricular Rate:  63 PR Interval:    QRS Duration: 162 QT Interval:  480 QTC Calculation: 491 R Axis:   34 Text Interpretation:  Ventricular-paced rhythm Abnormal ECG Confirmed by Davonna Belling [phone]) on 12/11/2018 4:48:03 PM   Radiology Ct Head Wo Contrast  Result Date: 12/11/2018 CLINICAL DATA:  Onset lightheadedness today during a bowel movement. Initial encounter. EXAM: CT HEAD WITHOUT CONTRAST TECHNIQUE: Contiguous axial images were obtained from the base of the skull through the vertex without intravenous contrast. COMPARISON:  PET-CT scan 03/16/2018. FINDINGS: Brain: No evidence of acute infarction, hemorrhage, hydrocephalus, extra-axial collection or mass lesion/mass effect. Atrophy and chronic microvascular ischemic change again seen. Vascular: Atherosclerosis. Skull: Intact.  No focal lesion. Sinuses/Orbits: Status post cataract surgery. Other: None. IMPRESSION: No of atrophy and chronic microvascular ischemic change. Atherosclerosis. Electronically Signed   By: Inge Rise M.D.   On: 12/11/2018 16:42    Procedures Procedures (including critical care time)  Medications Ordered in ED Medications  sodium chloride flush (NS) 0.9 % injection 3 mL (has no administration in time range)     Initial Impression / Assessment and Plan / ED Course  I have reviewed the triage vital signs and the nursing notes.  Pertinent labs & imaging results  that were available during my care of the patient were reviewed by me and considered in my medical decision making (see chart for details).        Patient with difficulty speaking earlier today.  Resolved.  However she is on Coumadin and her INR is only 1.8.  Does have a pacemaker.  Generator changed out around a year ago but has had for  almost 30 years otherwise.  I think the A. fib and the lower INR put her at higher risk for stroke/TIA.  Will discuss with hospitalist about admission.  Final Clinical Impressions(s) / ED Diagnoses   Final diagnoses:  Difficulty speaking    ED Discharge Orders    None       Davonna Belling, MD 12/11/18 1700

## 2018-12-12 ENCOUNTER — Observation Stay (HOSPITAL_BASED_OUTPATIENT_CLINIC_OR_DEPARTMENT_OTHER): Payer: Medicare Other

## 2018-12-12 DIAGNOSIS — R471 Dysarthria and anarthria: Secondary | ICD-10-CM | POA: Diagnosis not present

## 2018-12-12 DIAGNOSIS — I361 Nonrheumatic tricuspid (valve) insufficiency: Secondary | ICD-10-CM

## 2018-12-12 DIAGNOSIS — I340 Nonrheumatic mitral (valve) insufficiency: Secondary | ICD-10-CM | POA: Diagnosis not present

## 2018-12-12 LAB — CBC
HCT: 40 % (ref 36.0–46.0)
Hemoglobin: 13 g/dL (ref 12.0–15.0)
MCH: 30.3 pg (ref 26.0–34.0)
MCHC: 32.5 g/dL (ref 30.0–36.0)
MCV: 93.2 fL (ref 80.0–100.0)
Platelets: 226 10*3/uL (ref 150–400)
RBC: 4.29 MIL/uL (ref 3.87–5.11)
RDW: 13.5 % (ref 11.5–15.5)
WBC: 6.6 10*3/uL (ref 4.0–10.5)
nRBC: 0 % (ref 0.0–0.2)

## 2018-12-12 LAB — BASIC METABOLIC PANEL
Anion gap: 11 (ref 5–15)
BUN: 18 mg/dL (ref 8–23)
CO2: 26 mmol/L (ref 22–32)
Calcium: 9.3 mg/dL (ref 8.9–10.3)
Chloride: 100 mmol/L (ref 98–111)
Creatinine, Ser: 0.98 mg/dL (ref 0.44–1.00)
GFR calc Af Amer: >60 mL/min (ref >60)
GFR calc non Af Amer: 52 mL/min — ABNORMAL LOW (ref >60)
Glucose, Bld: 111 mg/dL — ABNORMAL HIGH (ref 70–99)
Potassium: 4.1 mmol/L (ref 3.5–5.1)
Sodium: 137 mmol/L (ref 135–145)

## 2018-12-12 LAB — VITAMIN B12: Vitamin B-12: 1534 pg/mL — ABNORMAL HIGH (ref 180–914)

## 2018-12-12 LAB — CBG MONITORING, ED: Glucose-Capillary: 135 mg/dL — ABNORMAL HIGH (ref 70–99)

## 2018-12-12 LAB — PROTIME-INR
INR: 1.8 — ABNORMAL HIGH (ref 0.8–1.2)
Prothrombin Time: 20.3 seconds — ABNORMAL HIGH (ref 11.4–15.2)

## 2018-12-12 LAB — TSH: TSH: 0.038 u[IU]/mL — ABNORMAL LOW (ref 0.350–4.500)

## 2018-12-12 LAB — SARS CORONAVIRUS 2 BY RT PCR (HOSPITAL ORDER, PERFORMED IN ~~LOC~~ HOSPITAL LAB): SARS Coronavirus 2: NEGATIVE

## 2018-12-12 LAB — LIPID PANEL
Cholesterol: 212 mg/dL — ABNORMAL HIGH (ref 0–200)
HDL: 78 mg/dL (ref >40)
LDL Cholesterol: 120 mg/dL — ABNORMAL HIGH (ref 0–99)
Total CHOL/HDL Ratio: 2.7 RATIO
Triglycerides: 72 mg/dL (ref <150)
VLDL: 14 mg/dL (ref 0–40)

## 2018-12-12 LAB — ECHOCARDIOGRAM COMPLETE

## 2018-12-12 LAB — MAGNESIUM: Magnesium: 2 mg/dL (ref 1.7–2.4)

## 2018-12-12 MED ORDER — ATORVASTATIN CALCIUM 20 MG PO TABS: 20.0000 mg | ORAL_TABLET | Freq: Every day | ORAL | 0 refills | Status: DC

## 2018-12-12 NOTE — Discharge Summary (Signed)
Physician Discharge Summary  JACQLINE [name] [id number] DOB: 1930-05-26 DOA: 12/11/2018  PCP: Burnard Bunting, MD  Admit date: 12/11/2018 Discharge date: 12/12/2018  Admitted From: Home  Disposition:  Home  Recommendations for Outpatient Follow-up:  1. Follow up with PCP in 1-2 weeks 2. Advised to get repeat INR check in 2 days. 3. Started low-dose statin    Discharge Condition: Stable CODE STATUS: Full code Diet recommendation: Heart healthy  Brief/Interim Summary: 85 y.o. female with medical history significant of atrial fibrillation on Coumadin, sick sinus syndrome status post pacemaker in place, hypothyroidism, osteoarthritis came to the hospital with complaints of dysarthria.  Patient lives at home alone and gets around-the-clock help from caretaker.  Her caretaker noticed this morning she was having some speech difficulty as they were leaving to go to her Coumadin clinic.  Ended up calling EMS, dysarthria L lasted for about 10 minutes.  Patient denies having similar symptoms in the past.  By the time she came to the ER her symptoms resolved. In the ER her labs were unremarkable.  Vital signs showed slightly elevated blood pressure.  CT of the head was negative.  Medical team was requested to admit the patient.  Unable to get MRI has she has pacemaker in place which is not compatible. Upon admission CT of the head was negative, CTA of the head and neck did not show any large vessel occlusion.  Carotid bifurcation had stenosis less than 35 percent.  Some moderate small vessel intracranial disease most notably in PCA bilaterally.  LDL slightly elevated.  She was started on low-dose statin.  Aspirin was not added given she is already on Coumadin and high risk for bleeding.  She can follow-up outpatient with primary care physician in the next 1 week.  She is also advised to get repeat INR check in 2 days.  Discharge Diagnoses:  Principal Problem:   Dysarthria Active Problems:    Long term current use of anticoagulant therapy   Accelerated hypertension   Chronic atrial fibrillation   Hypothyroidism   Dysarthria concerning for TIA, resolved -Symptoms have resolved. CT of the head is negative.  CT of the head and neck shows less than 35% stenosis.  Does have some moderate small vessel disease in the PCA circulation.  Advised to use basic precaution.  Hesitant to add aspirin to her Coumadin given she is at high risk for bleeding. Would avoid aggressively treating her blood pressure to prevent intracranial hypotension and further TIA symptoms and possible stroke. -Elevated LDL, will add low-dose statin. - INR 1.8.  Advised to continue her Coumadin, follow-up in her Coumadin clinic in next 1-2 days for repeat lab work. -PT-no follow-up needed. 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    Resume home regimen of Coumadin.  While here pharmacy is dosing it.  Hypothyroidism -Continue Synthroid.  Essential hypertension -Resume home medications tomorrow   Consultations:  None  Subjective: Feels better.  Does not have any complaints.  She is back to her baseline and has not had any further symptoms since yesterday morning.  Discharge Exam: Vitals:   12/12/18 0426 12/12/18 0625  BP: (!) 146/81 (!) 146/71  Pulse: 80 62  Resp: 18 16  Temp:    SpO2: 100% 100%   Vitals:   12/11/18 2215 12/12/18 0000 12/12/18 0426 12/12/18 0625  BP: 130/65 (!) 112/92 (!) 146/81 (!) 146/71  Pulse: 62 60 80 62  Resp:  19 18 16   Temp:      TempSrc:      SpO2: 99% 99% 100% 100%    General: Pt is alert, awake, not in acute distress Cardiovascular: RRR, S1/S2 +, no rubs, no gallops Respiratory: CTA bilaterally, no wheezing, no rhonchi Abdominal: Soft, NT, ND, bowel sounds + Extremities: no edema, no cyanosis  Discharge Instructions   Allergies as of 12/12/2018      Reactions    Adhesive [tape] Other (See Comments)   TAPE WILL TEAR THE SKIN!!!!   Penicillins Hives, Rash   Has patient had a PCN reaction causing immediate rash, facial/tongue/throat swelling, SOB or lightheadedness with hypotension: Yes Has patient had a PCN reaction causing severe rash involving mucus membranes or skin necrosis: Yes Has patient had a PCN reaction that required hospitalization: Was IN the hosp Has patient had a PCN reaction occurring within the last 10 years: No If all of the above answers are "NO", then may proceed with Cephalosporin use.      Medication List    TAKE these medications   acetaminophen 500 MG tablet Commonly known as: TYLENOL Take 1,000 mg by mouth 2 (two) times daily.   amLODipine 10 MG tablet Commonly known as: NORVASC Take 10 mg by mouth daily.   atorvastatin 20 MG tablet Commonly known as: LIPITOR Take 1 tablet (20 mg total) by mouth daily.   B-12 1000 MCG Caps Take 1,000 mcg by mouth daily.   celecoxib 200 MG capsule Commonly known as: CELEBREX Take 200 mg at bedtime by mouth.   digoxin 0.125 MG tablet Commonly known as: LANOXIN Take 0.125 mg by mouth daily. Hold for pulse less than 60.   levothyroxine 137 MCG tablet Commonly known as: SYNTHROID Take 137 mcg by mouth daily before breakfast.   loratadine 10 MG tablet Commonly known as: CLARITIN Take 10 mg by mouth daily as needed for allergies.   meclizine 25 MG tablet Commonly known as: ANTIVERT Take 25 mg by mouth daily as needed for dizziness or nausea.   oxybutynin 5 MG 24 hr tablet Commonly known as: DITROPAN-XL Take 5 mg by mouth daily.   oxyCODONE-acetaminophen 5-325 MG tablet Commonly known as: PERCOCET/ROXICET Take 1-2 tablets by mouth every 4 (four) hours as needed for severe pain.   polyethylene glycol 17 g packet Commonly known as: MIRALAX / GLYCOLAX Take 17 g by mouth daily.   senna-docusate 8.6-50 MG tablet Commonly known as: Senokot-S Take 1 tablet by mouth 2 (two)  times daily.   sertraline 25 MG tablet Commonly known as: ZOLOFT Take 25 mg by mouth at bedtime.   Vitamin D3 Super Strength 50 MCG (2000 UT) Caps Generic drug: Cholecalciferol Take 2,000 Units by mouth daily.   warfarin 2.5 MG tablet Commonly known as: COUMADIN Take as directed. If you are unsure how to take this medication, talk to your nurse or doctor. Original instructions: Per coumadin clinic What changed: Another medication with the same name was changed. Make sure you understand how and when to take each.   warfarin 5 MG tablet Commonly known as: COUMADIN Take as directed. If you are unsure how to take this medication, talk to your nurse or doctor. Original instructions: TAKE 1/2 TO 1 TABLET DAILY AS DIRECTED BY COUMADIN CLINIC What changed: See the new instructions.      Follow-up Information    Burnard Bunting, MD. Schedule an appointment as soon as possible for a visit in 1 week(s).   Specialty: Internal Medicine Contact  information: [address] [place] [address] [phone]          Allergies  Allergen Reactions  . Adhesive [Tape] Other (See Comments)    TAPE WILL TEAR THE SKIN!!!!  . Penicillins Hives and Rash    Has patient had a PCN reaction causing immediate rash, facial/tongue/throat swelling, SOB or lightheadedness with hypotension: Yes Has patient had a PCN reaction causing severe rash involving mucus membranes or skin necrosis: Yes Has patient had a PCN reaction that required hospitalization: Was IN the hosp Has patient had a PCN reaction occurring within the last 10 years: No If all of the above answers are "NO", then may proceed with Cephalosporin use.     You were cared for by a hospitalist during your hospital stay. If you have any questions about your discharge medications or the care you received while you were in the hospital after you are discharged, you can call the unit and asked to speak with the hospitalist on call if the  hospitalist that took care of you is not available. Once you are discharged, your primary care physician will handle any further medical issues. Please note that no refills for any discharge medications will be authorized once you are discharged, as it is imperative that you return to your primary care physician (or establish a relationship with a primary care physician if you do not have one) for your aftercare needs so that they can reassess your need for medications and monitor your lab values.   Procedures/Studies: Ct Angio Head W Or Wo Contrast  Result Date: 12/11/2018 CLINICAL DATA:  Initial evaluation for acute headache. EXAM: CT ANGIOGRAPHY HEAD AND NECK TECHNIQUE: Multidetector CT imaging of the head and neck was performed using the standard protocol during bolus administration of intravenous contrast. Multiplanar CT image reconstructions and MIPs were obtained to evaluate the vascular anatomy. Carotid stenosis measurements (when applicable) are obtained utilizing NASCET criteria, using the distal internal carotid diameter as the denominator. CONTRAST:  46mL OMNIPAQUE IOHEXOL 350 MG/ML SOLN COMPARISON:  Prior head CT from earlier same day. FINDINGS: CTA NECK FINDINGS Aortic arch: Visualized aortic arch of normal caliber. Origin of the great vessels incompletely visualized on this exam. Moderate atherosclerotic change noted about the visualized arch. No hemodynamically significant stenosis seen about the visualized origins of the great vessels. Visualized subclavian arteries widely patent. Right carotid system: Right CCA tortuous proximally but widely patent to the bifurcation without stenosis. Mild calcified plaque about the origin of the right ICA with associated mild stenosis of approximately 35% by NASCET criteria. Right ICA widely patent distally to the skull base without stenosis, dissection, or occlusion. Left carotid system: Visualized left CCA tortuous proximally but patent to the bifurcation  without significant stenosis. Mild scattered calcified plaque about the left bifurcation without hemodynamically significant stenosis. Left ICA widely patent distally to the skull base without stenosis, dissection, or occlusion. Vertebral arteries: Both vertebral arteries arise from the subclavian arteries. Left vertebral artery dominant. Vertebral arteries widely patent within the neck without stenosis, dissection, or occlusion. Skeleton: No acute osseous finding. No discrete osseous lesions. Other neck: No other acute abnormality within the neck. Prior thyroidectomy noted. Upper chest: Visualized upper chest demonstrates no acute finding. Emphysematous changes noted. Review of the MIP images confirms the above findings CTA HEAD FINDINGS Anterior circulation: Petrous segments widely patent bilaterally. Scattered atheromatous plaque within the carotid siphons with resultant mild diffuse narrowing. No hemodynamically significant stenosis. A1 segments widely patent. Normal anterior communicating artery. Anterior cerebral  arteries widely patent to their distal aspects without stenosis. No M1 stenosis or occlusion. Negative MCA bifurcations. Distal MCA branches well perfused and symmetric. Distal small vessel atheromatous irregularity noted. Posterior circulation: Vertebral arteries patent to the vertebrobasilar junction without stenosis. Patent left PICA. Right PICA not seen. Short fenestration involving the basilar artery noted. Basilar widely patent to its distal aspect without stenosis. Superior cerebral arteries patent bilaterally. Both of the posterior cerebral arteries primarily supplied via the basilar. Moderate multifocal atheromatous irregularity with moderate to severe stenoses seen involving the PCAs bilaterally, right worse than left. PCAs are patent to their distal aspects. Venous sinuses: Patent. Anatomic variants: None significant. No intracranial aneurysm. Review of the MIP images confirms the above  findings IMPRESSION: 1. Negative CTA for emergent large vessel occlusion. No high-grade or correctable stenosis within the major arterial vasculature of the head and neck. No aneurysm. 2. Mild for age atheromatous plaque about the carotid bifurcations with associated stenosis of no more than 35% by NASCET criteria. 3. Moderate small vessel atheromatous irregularity within the intracranial circulation, most notable about the PCAs bilaterally. 4. Emphysema. Electronically Signed   By: Jeannine Boga M.D.   On: 12/11/2018 23:04   Ct Head Wo Contrast  Result Date: 12/11/2018 CLINICAL DATA:  Onset lightheadedness today during a bowel movement. Initial encounter. EXAM: CT HEAD WITHOUT CONTRAST TECHNIQUE: Contiguous axial images were obtained from the base of the skull through the vertex without intravenous contrast. COMPARISON:  PET-CT scan 03/16/2018. FINDINGS: Brain: No evidence of acute infarction, hemorrhage, hydrocephalus, extra-axial collection or mass lesion/mass effect. Atrophy and chronic microvascular ischemic change again seen. Vascular: Atherosclerosis. Skull: Intact.  No focal lesion. Sinuses/Orbits: Status post cataract surgery. Other: None. IMPRESSION: No of atrophy and chronic microvascular ischemic change. Atherosclerosis. Electronically Signed   By: Inge Rise M.D.   On: 12/11/2018 16:42   Ct Angio Neck W Or Wo Contrast  Result Date: 12/11/2018 CLINICAL DATA:  Initial evaluation for acute headache. EXAM: CT ANGIOGRAPHY HEAD AND NECK TECHNIQUE: Multidetector CT imaging of the head and neck was performed using the standard protocol during bolus administration of intravenous contrast. Multiplanar CT image reconstructions and MIPs were obtained to evaluate the vascular anatomy. Carotid stenosis measurements (when applicable) are obtained utilizing NASCET criteria, using the distal internal carotid diameter as the denominator. CONTRAST:  62mL OMNIPAQUE IOHEXOL 350 MG/ML SOLN COMPARISON:   Prior head CT from earlier same day. FINDINGS: CTA NECK FINDINGS Aortic arch: Visualized aortic arch of normal caliber. Origin of the great vessels incompletely visualized on this exam. Moderate atherosclerotic change noted about the visualized arch. No hemodynamically significant stenosis seen about the visualized origins of the great vessels. Visualized subclavian arteries widely patent. Right carotid system: Right CCA tortuous proximally but widely patent to the bifurcation without stenosis. Mild calcified plaque about the origin of the right ICA with associated mild stenosis of approximately 35% by NASCET criteria. Right ICA widely patent distally to the skull base without stenosis, dissection, or occlusion. Left carotid system: Visualized left CCA tortuous proximally but patent to the bifurcation without significant stenosis. Mild scattered calcified plaque about the left bifurcation without hemodynamically significant stenosis. Left ICA widely patent distally to the skull base without stenosis, dissection, or occlusion. Vertebral arteries: Both vertebral arteries arise from the subclavian arteries. Left vertebral artery dominant. Vertebral arteries widely patent within the neck without stenosis, dissection, or occlusion. Skeleton: No acute osseous finding. No discrete osseous lesions. Other neck: No other acute abnormality within the neck. Prior thyroidectomy noted.  Upper chest: Visualized upper chest demonstrates no acute finding. Emphysematous changes noted. Review of the MIP images confirms the above findings CTA HEAD FINDINGS Anterior circulation: Petrous segments widely patent bilaterally. Scattered atheromatous plaque within the carotid siphons with resultant mild diffuse narrowing. No hemodynamically significant stenosis. A1 segments widely patent. Normal anterior communicating artery. Anterior cerebral arteries widely patent to their distal aspects without stenosis. No M1 stenosis or occlusion.  Negative MCA bifurcations. Distal MCA branches well perfused and symmetric. Distal small vessel atheromatous irregularity noted. Posterior circulation: Vertebral arteries patent to the vertebrobasilar junction without stenosis. Patent left PICA. Right PICA not seen. Short fenestration involving the basilar artery noted. Basilar widely patent to its distal aspect without stenosis. Superior cerebral arteries patent bilaterally. Both of the posterior cerebral arteries primarily supplied via the basilar. Moderate multifocal atheromatous irregularity with moderate to severe stenoses seen involving the PCAs bilaterally, right worse than left. PCAs are patent to their distal aspects. Venous sinuses: Patent. Anatomic variants: None significant. No intracranial aneurysm. Review of the MIP images confirms the above findings IMPRESSION: 1. Negative CTA for emergent large vessel occlusion. No high-grade or correctable stenosis within the major arterial vasculature of the head and neck. No aneurysm. 2. Mild for age atheromatous plaque about the carotid bifurcations with associated stenosis of no more than 35% by NASCET criteria. 3. Moderate small vessel atheromatous irregularity within the intracranial circulation, most notable about the PCAs bilaterally. 4. Emphysema. Electronically Signed   By: Jeannine Boga M.D.   On: 12/11/2018 23:04     The results of significant diagnostics from this hospitalization (including imaging, microbiology, ancillary and laboratory) are listed below for reference.     Microbiology: Recent Results (from the past 240 hour(s))  SARS Coronavirus 2 [facility] order, Performed in [facility] hospital lab) Nasopharyngeal Nasopharyngeal Swab     Status: None   Collection Time: 12/12/18  8:06 AM   Specimen: Nasopharyngeal Swab  Result Value Ref Range Status   SARS Coronavirus 2 NEGATIVE NEGATIVE Final    Comment: (NOTE) If result is NEGATIVE SARS-CoV-2 target nucleic acids are NOT  DETECTED. The SARS-CoV-2 RNA is generally detectable in upper and lower  respiratory specimens during the acute phase of infection. The lowest  concentration of SARS-CoV-2 viral copies this assay can detect is 250  copies / mL. A negative result does not preclude SARS-CoV-2 infection  and should not be used as the sole basis for treatment or other  patient management decisions.  A negative result may occur with  improper specimen collection / handling, submission of specimen other  than nasopharyngeal swab, presence of viral mutation(s) within the  areas targeted by this assay, and inadequate number of viral copies  (<250 copies / mL). A negative result must be combined with clinical  observations, patient history, and epidemiological information. If result is POSITIVE SARS-CoV-2 target nucleic acids are DETECTED. The SARS-CoV-2 RNA is generally detectable in upper and lower  respiratory specimens dur ing the acute phase of infection.  Positive  results are indicative of active infection with SARS-CoV-2.  Clinical  correlation with patient history and other diagnostic information is  necessary to determine patient infection status.  Positive results do  not rule out bacterial infection or co-infection with other viruses. If result is PRESUMPTIVE POSTIVE SARS-CoV-2 nucleic acids MAY BE PRESENT.   A presumptive positive result was obtained on the submitted specimen  and confirmed on repeat testing.  While 2019 novel coronavirus  (SARS-CoV-2) nucleic acids may be present in  the submitted sample  additional confirmatory testing may be necessary for epidemiological  and / or clinical management purposes  to differentiate between  SARS-CoV-2 and other Sarbecovirus currently known to infect humans.  If clinically indicated additional testing with an alternate test  methodology [phone]) is advised. The SARS-CoV-2 RNA is generally  detectable in upper and lower respiratory sp ecimens during  the acute  phase of infection. The expected result is Negative. Fact Sheet for Patients:  StrictlyIdeas.no Fact Sheet for Healthcare Providers: BankingDealers.co.za This test is not yet approved or cleared by the [place] FDA and has been authorized for detection and/or diagnosis of SARS-CoV-2 by FDA under an Emergency Use Authorization (EUA).  This EUA will remain in effect (meaning this test can be used) for the duration of the COVID-19 declaration under Section 564(b)(1) of the Act, 21 U.S.C. section 360bbb-3(b)(1), unless the authorization is terminated or revoked sooner. Performed at [facility], [place] [address] [place], [place] [address]      Labs: BNP (last 3 results) No results for input(s): BNP in the last 8760 hours. Basic Metabolic Panel: Recent Labs  Lab 12/11/18 1231 12/12/18 0800  NA 138 137  K 4.4 4.1  CL 105 100  CO2 23 26  GLUCOSE 109* 111*  BUN 19 18  CREATININE 0.93 0.98  CALCIUM 9.4 9.3  MG  --  2.0   Liver Function Tests: Recent Labs  Lab 12/11/18 1231  AST 19  ALT 16  ALKPHOS 65  BILITOT 0.5  PROT 6.9  ALBUMIN 4.1   No results for input(s): LIPASE, AMYLASE in the last 168 hours. No results for input(s): AMMONIA in the last 168 hours. CBC: Recent Labs  Lab 12/11/18 1231 12/12/18 0800  WBC 6.0 6.6  NEUTROABS 3.9  --   HGB 13.0 13.0  HCT 41.4 40.0  MCV 93.9 93.2  PLT 240 226   Cardiac Enzymes: No results for input(s): CKTOTAL, CKMB, CKMBINDEX, TROPONINI in the last 168 hours. BNP: Invalid input(s): POCBNP CBG: Recent Labs  Lab 12/12/18 0029  GLUCAP 135*   D-Dimer No results for input(s): DDIMER in the last 72 hours. Hgb A1c No results for input(s): HGBA1C in the last 72 hours. Lipid Profile Recent Labs    12/12/18 0800  CHOL 212*  HDL 78  LDLCALC 120*  TRIG 72  CHOLHDL 2.7   Thyroid function studies Recent Labs    12/12/18 0036  TSH 0.038*   Anemia  work up Recent Labs    12/12/18 0036  VITAMINB12 1,534*   Urinalysis    Component Value Date/Time   COLORURINE YELLOW 11/20/2017 [place] 11/20/2017 1215   LABSPEC 1.031 (H) 11/20/2017 1215   PHURINE 6.0 11/20/2017 1215   GLUCOSEU NEGATIVE 11/20/2017 1215   HGBUR SMALL (A) 11/20/2017 1215   BILIRUBINUR NEGATIVE 11/20/2017 1215   KETONESUR NEGATIVE 11/20/2017 1215   PROTEINUR NEGATIVE 11/20/2017 1215   UROBILINOGEN 0.2 12/11/2010 1642   NITRITE NEGATIVE 11/20/2017 1215   LEUKOCYTESUR NEGATIVE 11/20/2017 1215   Sepsis Labs Invalid input(s): PROCALCITONIN,  WBC,  LACTICIDVEN Microbiology Recent Results (from the past 240 hour(s))  SARS Coronavirus 2 [facility] order, Performed in [facility] hospital lab) Nasopharyngeal Nasopharyngeal Swab     Status: None   Collection Time: 12/12/18  8:06 AM   Specimen: Nasopharyngeal Swab  Result Value Ref Range Status   SARS Coronavirus 2 NEGATIVE NEGATIVE Final    Comment: (NOTE) If result is NEGATIVE SARS-CoV-2 target nucleic acids are  NOT DETECTED. The SARS-CoV-2 RNA is generally detectable in upper and lower  respiratory specimens during the acute phase of infection. The lowest  concentration of SARS-CoV-2 viral copies this assay can detect is 250  copies / mL. A negative result does not preclude SARS-CoV-2 infection  and should not be used as the sole basis for treatment or other  patient management decisions.  A negative result may occur with  improper specimen collection / handling, submission of specimen other  than nasopharyngeal swab, presence of viral mutation(s) within the  areas targeted by this assay, and inadequate number of viral copies  (<250 copies / mL). A negative result must be combined with clinical  observations, patient history, and epidemiological information. If result is POSITIVE SARS-CoV-2 target nucleic acids are DETECTED. The SARS-CoV-2 RNA is generally detectable in upper and lower   respiratory specimens dur ing the acute phase of infection.  Positive  results are indicative of active infection with SARS-CoV-2.  Clinical  correlation with patient history and other diagnostic information is  necessary to determine patient infection status.  Positive results do  not rule out bacterial infection or co-infection with other viruses. If result is PRESUMPTIVE POSTIVE SARS-CoV-2 nucleic acids MAY BE PRESENT.   A presumptive positive result was obtained on the submitted specimen  and confirmed on repeat testing.  While 2019 novel coronavirus  (SARS-CoV-2) nucleic acids may be present in the submitted sample  additional confirmatory testing may be necessary for epidemiological  and / or clinical management purposes  to differentiate between  SARS-CoV-2 and other Sarbecovirus currently known to infect humans.  If clinically indicated additional testing with an alternate test  methodology [phone]) is advised. The SARS-CoV-2 RNA is generally  detectable in upper and lower respiratory sp ecimens during the acute  phase of infection. The expected result is Negative. Fact Sheet for Patients:  StrictlyIdeas.no Fact Sheet for Healthcare Providers: BankingDealers.co.za This test is not yet approved or cleared by the [place] FDA and has been authorized for detection and/or diagnosis of SARS-CoV-2 by FDA under an Emergency Use Authorization (EUA).  This EUA will remain in effect (meaning this test can be used) for the duration of the COVID-19 declaration under Section 564(b)(1) of the Act, 21 U.S.C. section 360bbb-3(b)(1), unless the authorization is terminated or revoked sooner. Performed at [facility], [place] [address], [place], [place] [address]      Time coordinating discharge:  I have spent 35 minutes face to face with the patient and on the ward discussing the patients care, assessment, plan and disposition with  other care givers. >50% of the time was devoted counseling the patient about the risks and benefits of treatment/Discharge disposition and coordinating care.   SIGNED:   Damita Lack, MD  [facility] 12/12/2018, 1:35 PM   If 7PM-7AM, please contact night-coverage [email or web address]

## 2018-12-12 NOTE — ED Notes (Signed)
Patient verbalizes understanding of discharge instructions. Opportunity for questioning and answers were provided. Armband removed by staff, pt discharged from ED.  

## 2018-12-12 NOTE — Evaluation (Signed)
Physical Therapy Evaluation Patient Details Name: [name] MRN: [id number] DOB: 07-15-1930 Today's Date: 12/12/2018   History of Present Illness  Pt is an 85 y/o female admitted secondary to dysarthria, thought to be caused by possible TIA. CT of head negative for acute abnormality. PMH includes a fib, HTN, and SSS s/p pacemaker.   Clinical Impression  Pt admitted secondary to problem above with deficits below. Pt requiring min guard for mobility tasks using cane. Mild unsteadiness noted, however, no overt LOB noted. Pt reports she feels she is close to baseline with gait. Reports she has caregivers to come stay 7 days a week and she is alone between 4p-8p each day. Will continue to follow acutely to maximize functional mobility independence and safety.     Follow Up Recommendations No PT follow up;Supervision for mobility/OOB    Equipment Recommendations  None recommended by PT    Recommendations for Other Services       Precautions / Restrictions Precautions Precautions: Fall Restrictions Weight Bearing Restrictions: No      Mobility  Bed Mobility               General bed mobility comments: Sitting EOB upon entry  Transfers Overall transfer level: Needs assistance Equipment used: Straight cane Transfers: Sit to/from Stand Sit to Stand: Min guard         General transfer comment: Min guard A for safety.   Ambulation/Gait Ambulation/Gait assistance: Min guard Gait Distance (Feet): 120 Feet Assistive device: Straight cane Gait Pattern/deviations: Step-through pattern;Decreased stride length;Trunk flexed Gait velocity: Decreased   General Gait Details: Slow, mildly unsteady gait. No overt LOB noted. Pt ambulating with flexed posture, however, reports this is baseline. Pt reports she feels she is close to baseline with gait.   Stairs            Wheelchair Mobility    Modified Rankin (Stroke Patients Only)       Balance Overall balance  assessment: Needs assistance Sitting-balance support: No upper extremity supported;Feet supported Sitting balance-Leahy Scale: Good     Standing balance support: During functional activity;Single extremity supported Standing balance-Leahy Scale: Fair                               Pertinent Vitals/Pain Pain Assessment: No/denies pain    Home Living Family/patient expects to be discharged to:: Private residence Living Arrangements: Alone Available Help at Discharge: Personal care attendant Type of Home: House Home Access: Stairs to enter Entrance Stairs-Rails: Left Entrance Stairs-Number of Steps: 2 Home Layout: One level Home Equipment: Cane - single point;Walker - 2 wheels Additional Comments: Has caregiver 7 days/week. Only hours she is alone is between 4p and 8p.     Prior Function Level of Independence: Needs assistance   Gait / Transfers Assistance Needed: Pt mod I with use of cane  ADL's / Homemaking Assistance Needed: Pt independent for ADLs, however, caregiver assist with housework and driving.         Hand Dominance        Extremity/Trunk Assessment   Upper Extremity Assessment Upper Extremity Assessment: Defer to OT evaluation    Lower Extremity Assessment Lower Extremity Assessment: Generalized weakness    Cervical / Trunk Assessment Cervical / Trunk Assessment: Kyphotic  Communication   Communication: No difficulties  Cognition Arousal/Alertness: Awake/alert Behavior During Therapy: WFL for tasks assessed/performed Overall Cognitive Status: Within Functional Limits for tasks assessed  General Comments      Exercises     Assessment/Plan    PT Assessment Patient needs continued PT services  PT Problem List Decreased strength;Decreased balance;Decreased mobility       PT Treatment Interventions DME instruction;Stair training;Gait training;Functional mobility  training;Therapeutic activities;Therapeutic exercise;Balance training;Patient/family education    PT Goals (Current goals can be found in the Care Plan section)  Acute Rehab PT Goals Patient Stated Goal: to go home PT Goal Formulation: With patient Time For Goal Achievement: 12/26/18 Potential to Achieve Goals: Good    Frequency Min 3X/week   Barriers to discharge        Co-evaluation               AM-PAC PT "6 Clicks" Mobility  Outcome Measure Help needed turning from your back to your side while in a flat bed without using bedrails?: None Help needed moving from lying on your back to sitting on the side of a flat bed without using bedrails?: A Little Help needed moving to and from a bed to a chair (including a wheelchair)?: A Little Help needed standing up from a chair using your arms (e.g., wheelchair or bedside chair)?: A Little Help needed to walk in hospital room?: A Little Help needed climbing 3-5 steps with a railing? : A Little 6 Click Score: 19    End of Session   Activity Tolerance: Patient tolerated treatment well Patient left: in bed;with call bell/phone within reach(sitting EOB ) Nurse Communication: Mobility status PT Visit Diagnosis: Muscle weakness (generalized) (M62.81)    Time: [id number] PT Time Calculation (min) (ACUTE ONLY): 18 min   Charges:   PT Evaluation $PT Eval Low Complexity: [place], PT, DPT  Acute Rehabilitation Services  Pager: [phone] Office: [phone]   Rudean Hitt 12/12/2018, 12:49 PM

## 2018-12-12 NOTE — ED Notes (Signed)
Dr. Reesa Chew paged to Kramer RN paged by Levada Dy

## 2018-12-12 NOTE — ED Notes (Signed)
Diet was ordered for Lunch. 

## 2018-12-12 NOTE — ED Notes (Signed)
Dinner called for pt

## 2018-12-12 NOTE — ED Notes (Signed)
Dr Staci Righter advised he completed pt's AVS at 1330 today and pt may be d/c'd.

## 2018-12-12 NOTE — ED Notes (Signed)
Per pt, call her friend, Vaughan Basta, when ready for d/c - [phone].

## 2018-12-12 NOTE — ED Notes (Signed)
Breakfast ordered 

## 2018-12-12 NOTE — ED Notes (Signed)
Pt ambulated with cane and one person assist to restroom. Fresh brief placed.

## 2018-12-13 ENCOUNTER — Ambulatory Visit (INDEPENDENT_AMBULATORY_CARE_PROVIDER_SITE_OTHER): Payer: Medicare Other | Admitting: Internal Medicine

## 2018-12-13 DIAGNOSIS — I482 Chronic atrial fibrillation, unspecified: Secondary | ICD-10-CM

## 2018-12-13 DIAGNOSIS — Z5181 Encounter for therapeutic drug level monitoring: Secondary | ICD-10-CM

## 2018-12-13 DIAGNOSIS — I998 Other disorder of circulatory system: Secondary | ICD-10-CM | POA: Diagnosis not present

## 2018-12-13 LAB — HEMOGLOBIN A1C
Hgb A1c MFr Bld: 5.7 % — ABNORMAL HIGH (ref 4.8–5.6)
Mean Plasma Glucose: 117 mg/dL

## 2018-12-18 ENCOUNTER — Ambulatory Visit (INDEPENDENT_AMBULATORY_CARE_PROVIDER_SITE_OTHER): Payer: Medicare Other | Admitting: *Deleted

## 2018-12-18 ENCOUNTER — Other Ambulatory Visit: Payer: Self-pay

## 2018-12-18 DIAGNOSIS — Z5181 Encounter for therapeutic drug level monitoring: Secondary | ICD-10-CM

## 2018-12-18 DIAGNOSIS — I482 Chronic atrial fibrillation, unspecified: Secondary | ICD-10-CM

## 2018-12-18 LAB — POCT INR: INR: 1.7 — AB (ref 2.0–3.0)

## 2018-12-18 NOTE — Patient Instructions (Addendum)
Description   Take an extra 1/2 tablet today, then  resume taking 1/2 tablet (2.5 mg) daily, except for 1 tablet (5mg ) on Tuesday and Saturday. Recheck INR in 1-2 weeks. Call Coumadin Clinic for any questions, upcoming procedures or changes in medications. #  [id number], Main # [phone]

## 2018-12-25 DIAGNOSIS — I480 Paroxysmal atrial fibrillation: Secondary | ICD-10-CM | POA: Diagnosis not present

## 2018-12-25 DIAGNOSIS — E039 Hypothyroidism, unspecified: Secondary | ICD-10-CM | POA: Diagnosis not present

## 2018-12-25 DIAGNOSIS — Z7901 Long term (current) use of anticoagulants: Secondary | ICD-10-CM | POA: Diagnosis not present

## 2018-12-25 DIAGNOSIS — I10 Essential (primary) hypertension: Secondary | ICD-10-CM | POA: Diagnosis not present

## 2018-12-25 DIAGNOSIS — R471 Dysarthria and anarthria: Secondary | ICD-10-CM | POA: Diagnosis not present

## 2018-12-25 DIAGNOSIS — E785 Hyperlipidemia, unspecified: Secondary | ICD-10-CM | POA: Diagnosis not present

## 2018-12-25 DIAGNOSIS — G459 Transient cerebral ischemic attack, unspecified: Secondary | ICD-10-CM | POA: Diagnosis not present

## 2018-12-27 ENCOUNTER — Other Ambulatory Visit: Payer: Self-pay

## 2018-12-27 ENCOUNTER — Ambulatory Visit (INDEPENDENT_AMBULATORY_CARE_PROVIDER_SITE_OTHER): Payer: Medicare Other | Admitting: *Deleted

## 2018-12-27 DIAGNOSIS — I482 Chronic atrial fibrillation, unspecified: Secondary | ICD-10-CM | POA: Diagnosis not present

## 2018-12-27 DIAGNOSIS — Z5181 Encounter for therapeutic drug level monitoring: Secondary | ICD-10-CM | POA: Diagnosis not present

## 2018-12-27 LAB — POCT INR: INR: 1.9 — AB (ref 2.0–3.0)

## 2018-12-27 NOTE — Patient Instructions (Addendum)
Description   Take 1 tablet today (5mg ), then start taking 1/2 tablet (2.5 mg) daily, except for 1 tablet (5mg ) Monday, Wednesday and Friday. Recheck INR in 1-2 weeks. Call Coumadin Clinic for any questions, upcoming procedures or changes in medications. #  [id number], Main # [phone]

## 2019-01-09 ENCOUNTER — Ambulatory Visit (INDEPENDENT_AMBULATORY_CARE_PROVIDER_SITE_OTHER): Payer: Medicare Other | Admitting: *Deleted

## 2019-01-09 ENCOUNTER — Other Ambulatory Visit: Payer: Self-pay

## 2019-01-09 DIAGNOSIS — Z5181 Encounter for therapeutic drug level monitoring: Secondary | ICD-10-CM | POA: Diagnosis not present

## 2019-01-09 DIAGNOSIS — I482 Chronic atrial fibrillation, unspecified: Secondary | ICD-10-CM

## 2019-01-09 LAB — POCT INR: INR: 2.2 (ref 2.0–3.0)

## 2019-01-09 NOTE — Patient Instructions (Addendum)
Description   Continue taking 2.5 mg daily, except for 5mg  on Monday, Wednesday and Friday. Recheck INR in 3 weeks. Call Coumadin Clinic for any questions, upcoming procedures or changes in medications. #  [id number], Main # [phone]

## 2019-01-15 ENCOUNTER — Other Ambulatory Visit: Payer: Self-pay

## 2019-01-15 ENCOUNTER — Ambulatory Visit (INDEPENDENT_AMBULATORY_CARE_PROVIDER_SITE_OTHER): Payer: Medicare Other | Admitting: Neurology

## 2019-01-15 ENCOUNTER — Encounter: Payer: Self-pay | Admitting: Neurology

## 2019-01-15 VITALS — BP 139/74 | HR 84 | Temp 97.5°F | Ht 64.0 in | Wt 144.0 lb

## 2019-01-15 DIAGNOSIS — G459 Transient cerebral ischemic attack, unspecified: Secondary | ICD-10-CM | POA: Diagnosis not present

## 2019-01-15 DIAGNOSIS — R269 Unspecified abnormalities of gait and mobility: Secondary | ICD-10-CM | POA: Insufficient documentation

## 2019-01-15 NOTE — Progress Notes (Signed)
PATIENT: [name] DOB: [birth year]  Chief Complaint  Patient presents with  . Transient Ischemic Attack    Patient presented to ED, with speech difficultly, on 12/11/2018.  She is here for further evaluation of TIA.  Marland Kitchen PCP    Burnard Bunting, MD     HISTORICAL  EVONE WENNINGER is a 85 year old female, seen in request by her primary care physician Dr. Burnard Bunting for evaluation of stroke  I have reviewed and summarized the referring note from the referring physician.  She had past medical history of hypertension, hyperlipidemia, atrial fibrillation, taking Coumadin, had a pacemaker, is not a MRI candidate, hypothyroidism, on supplement.  She lives alone at home, had around-the-clock help from caretakers.  She was admitted to the hospital on December 11, 2018, she was noted by her caregiver speech difficulty, dysarthria last about 10 minutes.  Patient had a history of traumatic brain injury in 2018, she was pulling a tree limb, fell backwards, had transient loss of consciousness, later she also experienced multiple fall, hit her frontal area at concrete surface, and fractured her collarbone, since the incident, she had mild difficulty talking.  But the incident on December 11, 2018, is much more than her baseline, Personally reviewed CT head, no atrophy, mild chronic small vessel disease.  CT angiogram of head and neck showed no large vessel disease.  Moderate small vessel atherosclerotic irregularity within the intracranial circulations.  Most noticeable at bilateral PCAs.  Laboratory evaluation showed elevated INR 2.2, lipid panel showed LDL 120, cholesterol 212, normal CBC, A1c was 5.7, normal B12 1534 TSH was decreased 0.038   REVIEW OF SYSTEMS: Full 14 system review of systems performed and notable only for as above All other review of systems were negative.  ALLERGIES: Allergies  Allergen Reactions  . Adhesive [Tape] Other (See Comments)    TAPE WILL  TEAR THE SKIN!!!!  . Penicillins Hives and Rash    Has patient had a PCN reaction causing immediate rash, facial/tongue/throat swelling, SOB or lightheadedness with hypotension: Yes Has patient had a PCN reaction causing severe rash involving mucus membranes or skin necrosis: Yes Has patient had a PCN reaction that required hospitalization: Was IN the hosp Has patient had a PCN reaction occurring within the last 10 years: No If all of the above answers are "NO", then may proceed with Cephalosporin use.     HOME MEDICATIONS: Current Outpatient Medications  Medication Sig Dispense Refill  . acetaminophen (TYLENOL) 500 MG tablet Take 1,000 mg by mouth 2 (two) times daily.    Marland Kitchen amLODipine (NORVASC) 10 MG tablet Take 10 mg by mouth every evening.     . celecoxib (CELEBREX) 200 MG capsule Take 200 mg at bedtime by mouth.     . Cholecalciferol (VITAMIN D3 SUPER STRENGTH) 50 MCG (2000 UT) CAPS Take 2,000 Units by mouth daily.    . Cyanocobalamin (B-12) 1000 MCG CAPS Take 1,000 mcg by mouth daily.    . digoxin (LANOXIN) 0.125 MG tablet Take 0.125 mg by mouth daily. Hold for pulse less than 60.    . levothyroxine (SYNTHROID) 137 MCG tablet Take 137 mcg by mouth daily before breakfast.    . polyethylene glycol (MIRALAX / GLYCOLAX) packet Take 17 g by mouth daily. 14 each 0  . sertraline (ZOLOFT) 25 MG tablet Take 25 mg by mouth at bedtime.    Marland Kitchen warfarin (COUMADIN) 2.5 MG tablet Take 2.5 mg by mouth See admin instructions. Take one tablet by mouth on  Mondays Wednesdays and Fridays    . warfarin (COUMADIN) 5 MG tablet TAKE 1/2 TO 1 TABLET DAILY AS DIRECTED BY COUMADIN CLINIC (Patient taking differently: Take 5 mg by mouth See admin instructions. Take 5mg  by mouth Tuesdays Thursdays Saturdays and Sundays) 30 tablet 1  . atorvastatin (LIPITOR) 20 MG tablet Take 1 tablet (20 mg total) by mouth daily. 30 tablet 0   No current facility-administered medications for this visit.     PAST MEDICAL HISTORY:  Past Medical History:  Diagnosis Date  . Arrhythmia    chronic atrial fib  . Atrial fibrillation, chronic ([place])   . Chronic anticoagulation   . Chronic atrial fibrillation ([place]) 01/28/2017  . Chronic venous insufficiency 01/28/2017  . Clavicle fracture 11/20/2017  . Complication of anesthesia    " difficult waking "  . Long term current use of anticoagulant therapy 01/28/2017  . Osteoarthritis   . Presence of permanent cardiac pacemaker 01/28/2017   Original implant 1992 Lead [employer] 431-04 Generator change 2001 and 2011.  Medtronic generator.   . SSS (sick sinus syndrome) ([place])   . Thyroid disease    hypothyroidism  . TIA (transient ischemic attack)     PAST SURGICAL HISTORY: Past Surgical History:  Procedure Laterality Date  . ABDOMINAL HYSTERECTOMY    . INSERT / REPLACE / REMOVE PACEMAKER     generator change 2011 medtronic sigma  . KNEE SURGERY    . PACEMAKER INSERTION    . PARS PLANA VITRECTOMY Right 07/19/2018   Procedure: VITRECTOMY WITH VITREOUS BIPOSY & ENDOLASER;  Surgeon: Jalene Mullet, MD;  Location: [facility];  Service: Ophthalmology;  Laterality: Right;  . PPM GENERATOR CHANGEOUT N/A 02/02/2017   Procedure: PPM GENERATOR CHANGEOUT;  Surgeon: Deboraha Sprang, MD;  Location: [facility];  Service: Cardiovascular;  Laterality: N/A;  . REPLACEMENT TOTAL KNEE BILATERAL    . VARICOSE VEIN SURGERY      FAMILY HISTORY: Family History  Problem Relation Age of Onset  . Diabetes Mother   . Diabetes Father     SOCIAL HISTORY: Social History   Socioeconomic History  . Marital status: Widowed    Spouse name: Not on file  . Number of children: 2  . Years of education: college  . Highest education level: Not on file  Occupational History  . Not on file  Social Needs  . Financial resource strain: Not on file  . Food insecurity    Worry: Not on file    Inability: Not on file  . Transportation needs    Medical: Not on file    Non-medical: Not on file   Tobacco Use  . Smoking status: Former Research scientist (life sciences)  . Smokeless tobacco: Never Used  Substance and Sexual Activity  . Alcohol use: No  . Drug use: No  . Sexual activity: Never  Lifestyle  . Physical activity    Days per week: Not on file    Minutes per session: Not on file  . Stress: Not on file  Relationships  . Social Herbalist on phone: Not on file    Gets together: Not on file    Attends religious service: Not on file    Active member of club or organization: Not on file    Attends meetings of clubs or organizations: Not on file    Relationship status: Not on file  . Intimate partner violence    Fear of current or ex partner: Not on file    Emotionally  abused: Not on file    Physically abused: Not on file    Forced sexual activity: Not on file  Other Topics Concern  . Not on file  Social History Narrative   Lives alone.  She has caregiver seven days per week, all hours except between 4pm - 8pm.   Right-handed.   Caffeine use: 2-3 cups per day     PHYSICAL EXAM   Vitals:   01/15/19 0949  BP: 139/74  Pulse: 84  Temp: (!) 97.5 F (36.4 C)  Weight: 144 lb (65.3 kg)  Height: 5\' 4"  (1.626 m)    Not recorded      Body mass index is 24.72 kg/m.  PHYSICAL EXAMNIATION:  Gen: NAD, conversant, well nourised, well groomed                     Cardiovascular: Regular rate rhythm, no peripheral edema, warm, nontender. Eyes: Conjunctivae clear without exudates or hemorrhage Neck: Supple, no carotid bruits. Pulmonary: Clear to auscultation bilaterally   NEUROLOGICAL EXAM:  MENTAL STATUS: Speech:    Speech is normal; fluent and spontaneous with normal comprehension.  Cognition:     Orientation to time, place and person     Normal recent and remote memory     Normal Attention span and concentration     Normal Language, naming, repeating,spontaneous speech     Fund of knowledge   CRANIAL NERVES: CN II: Visual fields are full to confrontation.  Pupils are  round equal and briskly reactive to light. CN III, IV, VI: extraocular movement are normal. No ptosis. CN V: Facial sensation is intact to pinprick in all 3 divisions bilaterally. Corneal responses are intact.  CN VII: Face is symmetric with normal eye closure and smile. CN VIII: Hearing is normal to causal conversation. CN IX, X: Palate elevates symmetrically. Phonation is normal. CN XI: Head turning and shoulder shrug are intact CN XII: Tongue is midline with normal movements and no atrophy.  MOTOR: There is no pronator drift of out-stretched arms. Muscle bulk and tone are normal. Muscle strength is normal.  REFLEXES: Reflexes are 2+ and symmetric at the biceps, triceps, knees, and ankles. Plantar responses are flexor.  SENSORY: Intact to light touch, pinprick, positional sensation and vibratory sensation are intact in fingers and toes.  COORDINATION: Rapid alternating movements and fine finger movements are intact. There is no dysmetria on finger-to-nose and heel-knee-shin.    GAIT/STANCE: She needs push-up to get up from seated position, kyphosis, mildly unsteady  DIAGNOSTIC DATA (LABS, IMAGING, TESTING) - I reviewed patient records, labs, notes, testing and imaging myself where available.   ASSESSMENT AND PLAN  FE ESSENBURG is a 85 y.o. female   TIA  Presented with transient dysarthria  Continue Coumadin treatment  Gait abnormality  Home health physical therapy    Marcial Pacas, M.D. Ph.D.  [facility] [address], [place], [place] [address] Ph: [phone] Fax: [phone]  CC: Burnard Bunting, MD

## 2019-01-17 ENCOUNTER — Telehealth: Payer: Self-pay | Admitting: Neurology

## 2019-01-17 NOTE — Telephone Encounter (Signed)
I spoke to the patient's daughter and reviewed her office visit with Dr. Krista Blue.  She appreciated the phone call.

## 2019-01-17 NOTE — Telephone Encounter (Signed)
Pt daughter (on [place]) has called stating she was not able to get much from the pt re: what took place during the appointment and what the next plan of care is.  Please call daughter to discuss this most recent appointement

## 2019-01-23 IMAGING — CT CT CERVICAL SPINE W/O CM
4 of 7 series · 12 of 33 positions shown, 14 images · non-contrast
Comparison: Report from a 07/15/2010 CT.

CLINICAL DATA: 85-year-old female with neck pain, un steady gait
and dizziness for 4 weeks. Weakness in hands. Pacemaker. No reported
trauma.

EXAM:
CT CERVICAL SPINE WITHOUT CONTRAST
TECHNIQUE: Multidetector CT imaging of the cervical spine was performed without
intravenous contrast. Multiplanar CT image reconstructions were also
generated.

[Series 201: cor 2 · coronal · 0.39mm/px · 1 of 58 slices shown]
[im 29/58  bone]
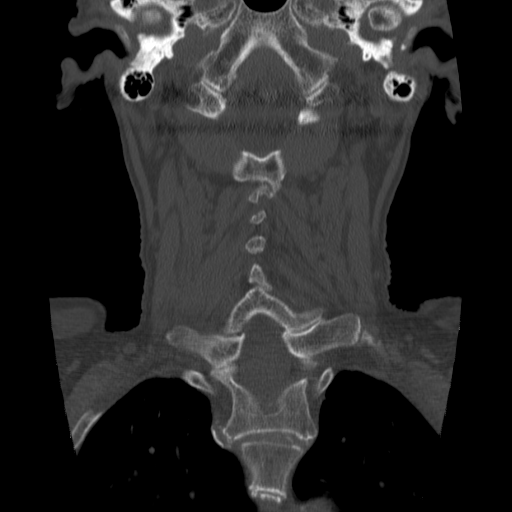

[Series 202: sag · sagittal · 0.39mm/px · 5 of 50 slices shown, 6 images]
[im 17/50  bone]
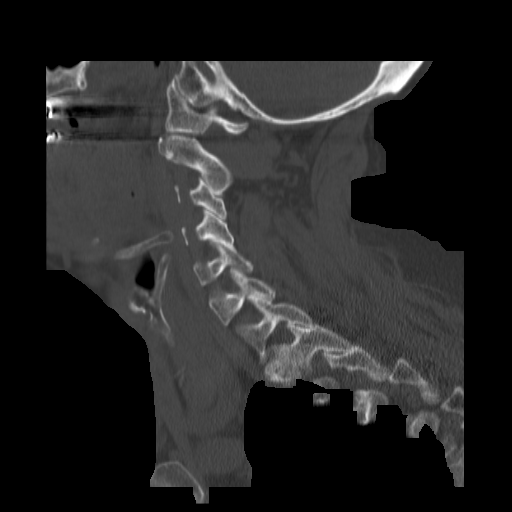
[im 21/50  bone]
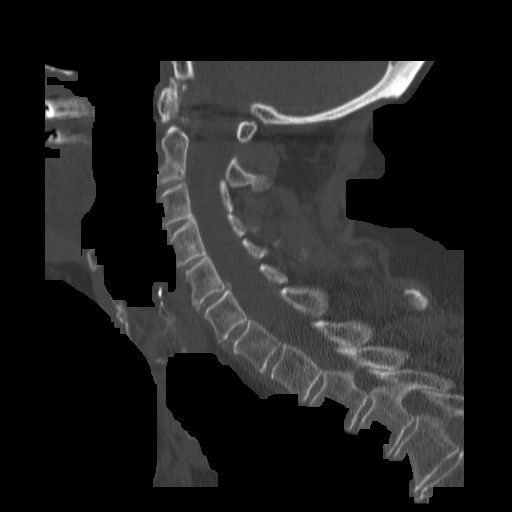
[im 25/50  soft-tissue]
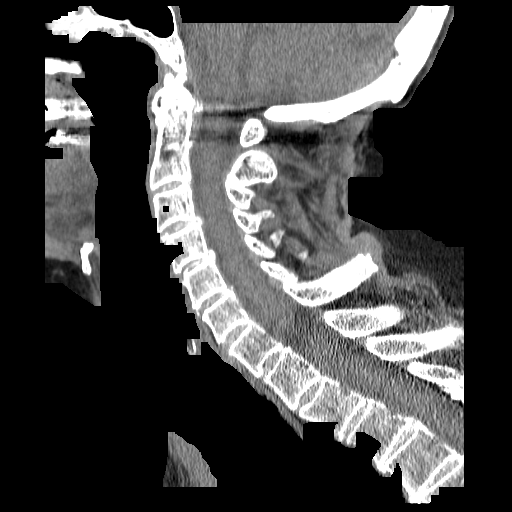
[im 25/50  bone]
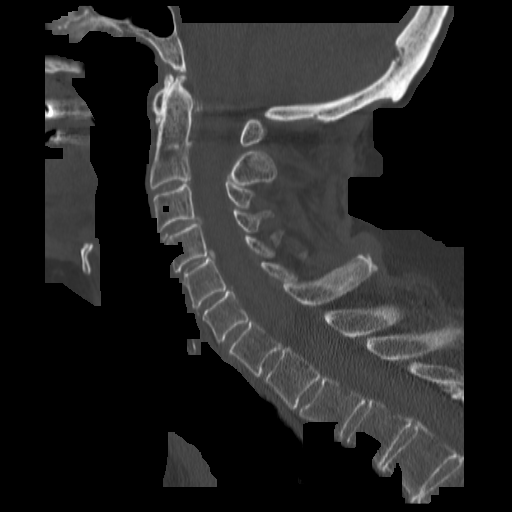
[im 29/50  bone]
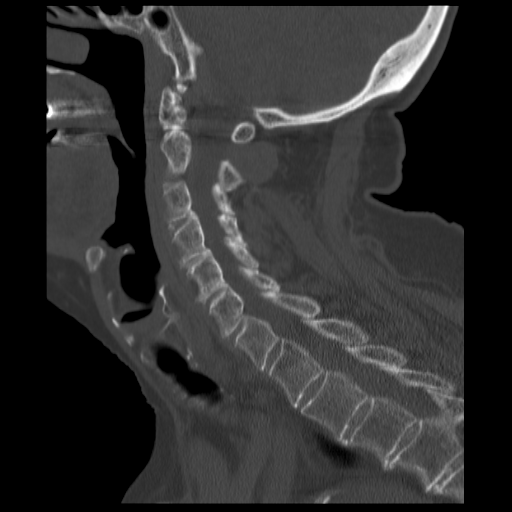
[im 33/50  bone]
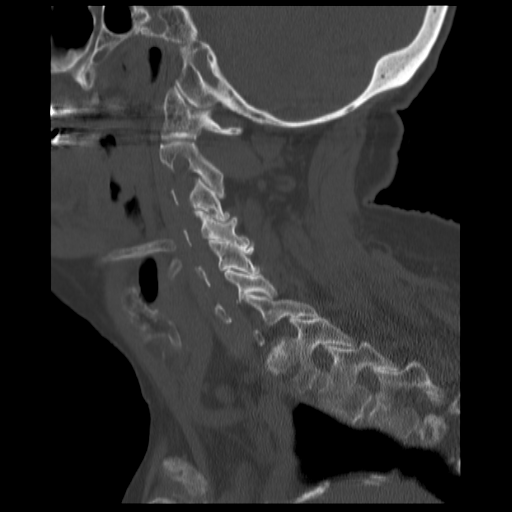

[Series 203: angled axial · axial · 0.20mm/px · z∈[+46,+144]mm · 3 of 283 slices shown, 4 images]
[im 71/283  soft-tissue]
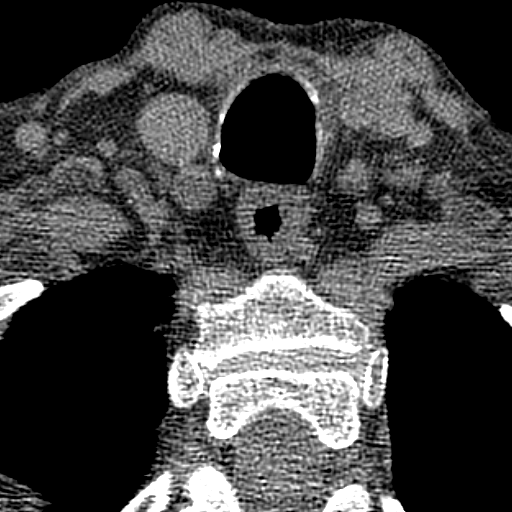
[im 71/283  bone]
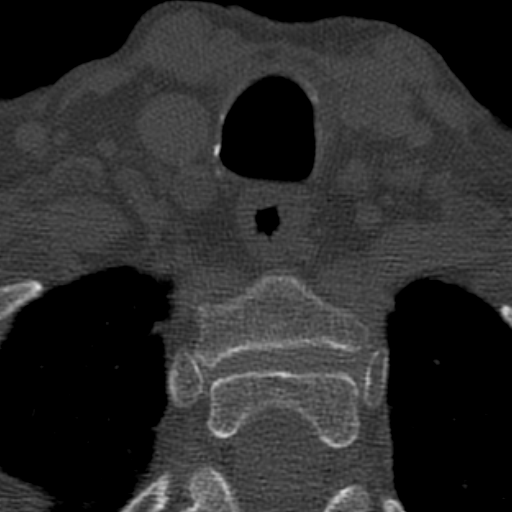
[im 142/283  bone]
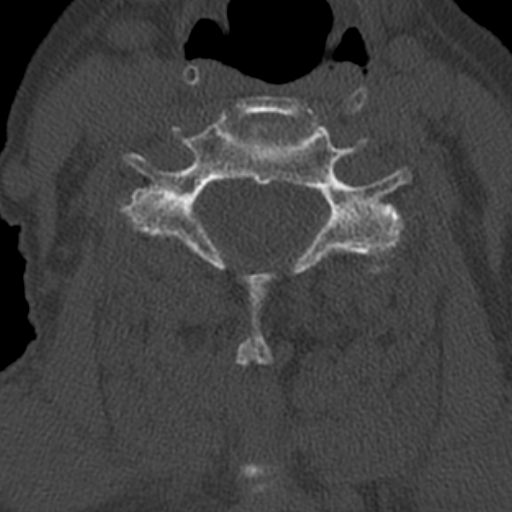
[im 212/283  bone]
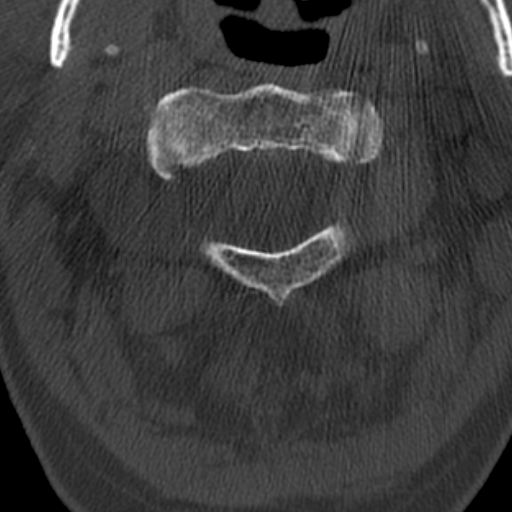

[Series 204: angled axial 2 · axial · 0.20mm/px · z∈[+32,+120]mm · 3 of 282 slices shown]
[im 71/282  bone]
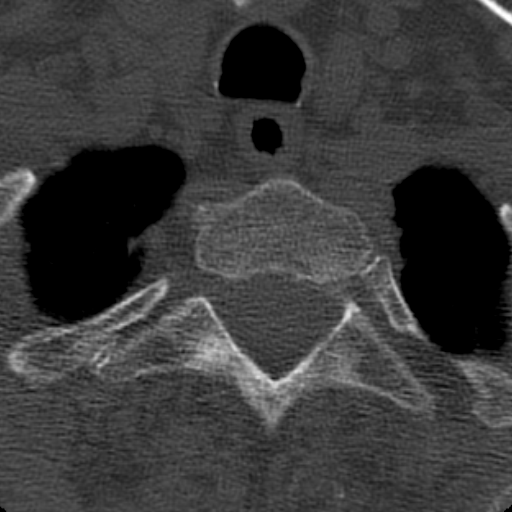
[im 141/282  bone]
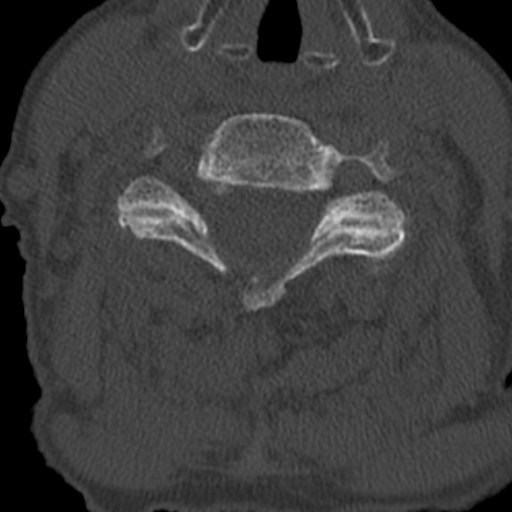
[im 211/282  bone]
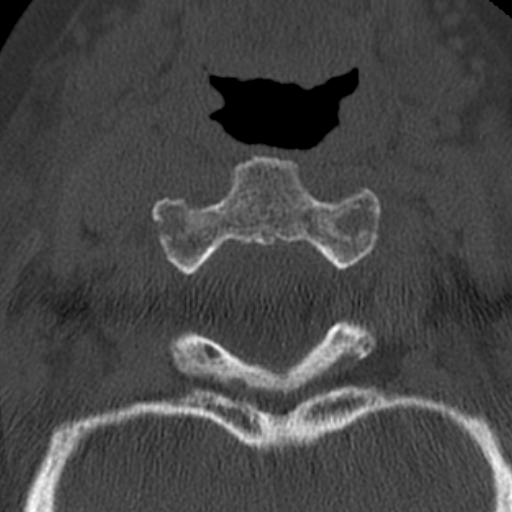

[12 of 33 positions shown; findings below may reference images not displayed]

FINDINGS: Alignment: Normal alignment.

Skull base and vertebrae: No cervical spine fracture.

Soft tissues and spinal canal: No abnormal prevertebral soft tissue
swelling. Carotid bifurcation calcifications. Post thyroid surgery.

Disc levels:

Cervicomedullary junction within normal limits.

C1-2: Mild transverse ligament hypertrophy. Slight narrowing ventral
thecal sac.

C2-3:  Negative.

C3-4: Left facet degenerative changes. Minimal left foraminal
narrowing. Shallow disc osteophyte complex with slight narrowing
ventral thecal sac minimally more notable on left.

C4-5: Left facet degenerative changes. Left uncinate hypertrophy.
Mild left foraminal narrowing. Small disc osteophyte complex with
slight impression upon ventral thecal sac.

C5-6: Minimal left facet degenerative changes. Minimal right
uncinate hypertrophy. Minimal right foraminal narrowing. Minimal
bulge/spur. Minimal narrowing ventral thecal sac.

C6-7:  No significant spinal stenosis or foraminal narrowing.

C7-T1:  No significant spinal stenosis or foraminal narrowing.

Upper chest: No worrisome abnormality.

Other: Pacemaker complete noted on the right.
IMPRESSION: Multilevel mild cervical spondylotic changes with slight narrowing
ventral thecal sac but without significant spinal stenosis or cord
flattening. Please see above for further detail.

Facet degenerative changes most notable on the left at the C3-4 and
C4-5 level.

## 2019-01-25 DIAGNOSIS — H52202 Unspecified astigmatism, left eye: Secondary | ICD-10-CM | POA: Diagnosis not present

## 2019-01-25 DIAGNOSIS — Z961 Presence of intraocular lens: Secondary | ICD-10-CM | POA: Diagnosis not present

## 2019-01-25 DIAGNOSIS — H04123 Dry eye syndrome of bilateral lacrimal glands: Secondary | ICD-10-CM | POA: Diagnosis not present

## 2019-01-25 DIAGNOSIS — H02402 Unspecified ptosis of left eyelid: Secondary | ICD-10-CM | POA: Diagnosis not present

## 2019-01-30 ENCOUNTER — Encounter: Payer: Medicare Other | Admitting: *Deleted

## 2019-01-30 ENCOUNTER — Ambulatory Visit (INDEPENDENT_AMBULATORY_CARE_PROVIDER_SITE_OTHER): Payer: Medicare Other

## 2019-01-30 ENCOUNTER — Other Ambulatory Visit: Payer: Self-pay

## 2019-01-30 DIAGNOSIS — I482 Chronic atrial fibrillation, unspecified: Secondary | ICD-10-CM

## 2019-01-30 DIAGNOSIS — Z5181 Encounter for therapeutic drug level monitoring: Secondary | ICD-10-CM

## 2019-01-30 DIAGNOSIS — I998 Other disorder of circulatory system: Secondary | ICD-10-CM

## 2019-01-30 LAB — POCT INR: INR: 1.5 — AB (ref 2.0–3.0)

## 2019-01-30 NOTE — Patient Instructions (Signed)
Description   Take 5mg  today, then start taking 5mg  daily except 2.5mg  on Tuesdays, Thursdays and Sundays. Recheck INR in 2 weeks. Call Coumadin Clinic for any questions, upcoming procedures or changes in medications. #  [id number], Main # [phone]

## 2019-02-01 ENCOUNTER — Telehealth: Payer: Self-pay | Admitting: Internal Medicine

## 2019-02-01 ENCOUNTER — Telehealth: Payer: Self-pay

## 2019-02-01 NOTE — Telephone Encounter (Signed)
New message     Pt had coumadin appt on Tuesday.  She stopped by checkout to make an appt with Dr Caryl Comes.  She said her pacemaker was "sticking" out of her chest a lot and sometimes it would "jump".  She wanted to see Dr Caryl Comes only.  I sent a message to [facility] to make her an appt but now she is out sick all week.  Not sure if pt needs a device check or to see someone.  I offered her an appt with an APP when she was here, but she wanted Dr Caryl Comes.  Can you call her and see what is wrong since Doylene Canning is out all week.   Thanks.

## 2019-02-01 NOTE — Telephone Encounter (Signed)
Left message for patient to remind of missed remote transmission. LL 

## 2019-02-05 ENCOUNTER — Ambulatory Visit (INDEPENDENT_AMBULATORY_CARE_PROVIDER_SITE_OTHER): Payer: Medicare Other | Admitting: Internal Medicine

## 2019-02-05 ENCOUNTER — Encounter: Payer: Self-pay | Admitting: Internal Medicine

## 2019-02-05 ENCOUNTER — Other Ambulatory Visit: Payer: Self-pay

## 2019-02-05 VITALS — BP 126/80 | HR 79 | Ht 64.0 in | Wt 144.0 lb

## 2019-02-05 DIAGNOSIS — I495 Sick sinus syndrome: Secondary | ICD-10-CM

## 2019-02-05 DIAGNOSIS — I482 Chronic atrial fibrillation, unspecified: Secondary | ICD-10-CM

## 2019-02-05 DIAGNOSIS — Z950 Presence of cardiac pacemaker: Secondary | ICD-10-CM

## 2019-02-05 LAB — CUP PACEART INCLINIC DEVICE CHECK
Battery Impedance: 1094 Ohm
Battery Remaining Longevity: 27 mo
Battery Voltage: 2.73 V
Brady Statistic RV Percent Paced: 98 %
Date Time Interrogation Session: 20201116184613
Implantable Lead Implant Date: 20181114
Implantable Lead Location: 753860
Implantable Pulse Generator Implant Date: 20181114
Lead Channel Impedance Value: 0 Ohm
Lead Channel Impedance Value: 157 Ohm
Lead Channel Pacing Threshold Amplitude: 2 V
Lead Channel Pacing Threshold Pulse Width: 1 ms
Lead Channel Setting Pacing Amplitude: 4 V
Lead Channel Setting Pacing Pulse Width: 1 ms
Lead Channel Setting Sensing Sensitivity: 4 mV

## 2019-02-05 NOTE — Progress Notes (Signed)
.      Patient Care Team: Burnard Bunting, MD as PCP - General (Internal Medicine)   HPI  [name] is a 85 y.o. female SEEN IN followup the pacemaker originally implanted by Dr. Doreatha Lew 25 years ago.  She has a history of complete heart block paroxysmal atrial fibrillation and severe orthostatic hypotension   She underwent device generator replacement 11/18.  Date Cr K Hgb  10/20 0.91 4.1 13.           Seh fell and broke her collarbone.  Thereafter, she has noted that her pacemaker generator is flopping back-and-forth.  There is some discomfort.  Device interrogation has further shown some gradual deterioration in impedance; it is an Intermedics 431 unipolar lead.  Battery longevity is estimated at 2 years and the device is just 83 years old.   Echocardiogram 9/20 demonstrated normal LV function with moderate aortic stenosis with a mean gradient of 23  DATE TEST EF   9/20 Echo   55-65 % AS mod--mean grad 23                 Past Medical History:  Diagnosis Date  . Arrhythmia    chronic atrial fib  . Atrial fibrillation, chronic ([place])   . Chronic anticoagulation   . Chronic atrial fibrillation ([place]) 01/28/2017  . Chronic venous insufficiency 01/28/2017  . Clavicle fracture 11/20/2017  . Complication of anesthesia    " difficult waking "  . Long term current use of anticoagulant therapy 01/28/2017  . Osteoarthritis   . Presence of permanent cardiac pacemaker 01/28/2017   Original implant 1992 Lead Intermedics 431-04 Generator change 2001 and 2011.  Medtronic generator.   . SSS (sick sinus syndrome) ([place])   . Thyroid disease    hypothyroidism  . TIA (transient ischemic attack)     Past Surgical History:  Procedure Laterality Date  . ABDOMINAL HYSTERECTOMY    . INSERT / REPLACE / REMOVE PACEMAKER     generator change 2011 medtronic sigma  . KNEE SURGERY    . PACEMAKER INSERTION    . PARS PLANA VITRECTOMY Right 07/19/2018   Procedure: VITRECTOMY WITH  VITREOUS BIPOSY & ENDOLASER;  Surgeon: Jalene Mullet, MD;  Location: [facility];  Service: Ophthalmology;  Laterality: Right;  . PPM GENERATOR CHANGEOUT N/A 02/02/2017   Procedure: PPM GENERATOR CHANGEOUT;  Surgeon: Deboraha Sprang, MD;  Location: [facility];  Service: Cardiovascular;  Laterality: N/A;  . REPLACEMENT TOTAL KNEE BILATERAL    . VARICOSE VEIN SURGERY      Current Outpatient Medications  Medication Sig Dispense Refill  . acetaminophen (TYLENOL) 500 MG tablet Take 1,000 mg by mouth 2 (two) times daily.    Marland Kitchen amLODipine (NORVASC) 10 MG tablet Take 10 mg by mouth every evening.     Marland Kitchen atorvastatin (LIPITOR) 20 MG tablet Take 1 tablet (20 mg total) by mouth daily. 30 tablet 0  . celecoxib (CELEBREX) 200 MG capsule Take 200 mg at bedtime by mouth.     . Cholecalciferol (VITAMIN D3 SUPER STRENGTH) 50 MCG (2000 UT) CAPS Take 2,000 Units by mouth daily.    . Cyanocobalamin (B-12) 1000 MCG CAPS Take 1,000 mcg by mouth daily.    . digoxin (LANOXIN) 0.125 MG tablet Take 0.125 mg by mouth daily. Hold for pulse less than 60.    . levothyroxine (SYNTHROID) 137 MCG tablet Take 137 mcg by mouth daily before breakfast.    . polyethylene glycol (MIRALAX / GLYCOLAX) packet Take  17 g by mouth daily. 14 each 0  . sertraline (ZOLOFT) 25 MG tablet Take 25 mg by mouth at bedtime.    Marland Kitchen warfarin (COUMADIN) 2.5 MG tablet Take 2.5 mg by mouth See admin instructions. Take one tablet by mouth on Mondays Wednesdays and Fridays    . warfarin (COUMADIN) 5 MG tablet TAKE 1/2 TO 1 TABLET DAILY AS DIRECTED BY COUMADIN CLINIC (Patient taking differently: Take 5 mg by mouth See admin instructions. Take 5mg  by mouth Tuesdays Thursdays Saturdays and Sundays) 30 tablet 1   No current facility-administered medications for this visit.     Allergies  Allergen Reactions  . Adhesive [Tape] Other (See Comments)    TAPE WILL TEAR THE SKIN!!!!  . Penicillins Hives and Rash    Has patient had a PCN reaction causing  immediate rash, facial/tongue/throat swelling, SOB or lightheadedness with hypotension: Yes Has patient had a PCN reaction causing severe rash involving mucus membranes or skin necrosis: Yes Has patient had a PCN reaction that required hospitalization: Was IN the hosp Has patient had a PCN reaction occurring within the last 10 years: No If all of the above answers are "NO", then may proceed with Cephalosporin use.       Review of Systems negative except from HPI and PMH  Physical Exam BP 126/80   Pulse 79   Ht 5\' 4"  (1.626 m)   Wt 144 lb (65.3 kg)   SpO2 97%   BMI 24.72 kg/m  Well developed and well nourished in no acute distress HENT normal Neck supple with JVP-flat Clear Device pocket well healed; without hematoma or erythema.  There is no tethering  Regular rate and rhythm, 2/6 systolic murmur Abd-soft with active BS No Clubbing cyanosis chronic edema with significant venous varicosities Skin-warm and dry A & Oriented  Grossly normal sensory and motor function  ECG *was reviewed from 12/11/2018 ventricular pacing with atrial fibrillation   Assessment and  Plan  Intermittent complete heart block with intrinsic conduction today  Orthostatic hypotension  Hypertension-severe  Atrial fibrillation permanent  Left leg swelling  Pacemaker-Medtronic    Impending RV lead failure  Pacemaker anchoring sutures almost certainly become dislodged.  And there is some risk for rotation of the lead.  There is gradual evidence of lead failure as noted previously.  Today she is without intrinsic conduction.  Hence, given the issue of lead integrity and device rotation we will undertake generator explant with a new implantation ipsilaterally if the vein is open and contralaterally if the vein is occluded.  The benefits and risks were reviewed including but not limited to death,  perforation, infection, lead dislodgement and device malfunction.  The patient understands agrees and is  willing to proceed.  We will need to continue to be attentive to orthostatic hypotension.  She and her daughter are also struggling with the issue of caregivers and attentiveness.  I suggest they read Being Mortal    We spent more than 50% of our >25 min visit in face to face counseling regarding the above      Current medicines are reviewed at length with the patient today .  The patient does not  have concerns regarding medicines.

## 2019-02-05 NOTE — Patient Instructions (Addendum)
Medication Instructions:  The current medical regimen is effective;  continue present plan and medications.  *If you need a refill on your cardiac medications before your next appointment, please call your pharmacy*  Lab Work: You will need blood work (CBC, BMP and PT/INR) and COVID screening before your procedure.  Once you have COVID screening you will need to self isolate at home until your procedure.  If you have labs (blood work) drawn today and your tests are completely normal, you will receive your results only by: Marland Kitchen MyChart Message (if you have MyChart) OR . A paper copy in the mail If you have any lab test that is abnormal or we need to change your treatment, we will call you to review the results.  Testing/Procedures: Your physician has recommended that you have a pacemaker inserted. A pacemaker is a small device that is placed under the skin of your chest or abdomen to help control abnormal heart rhythms. This device uses electrical pulses to prompt the heart to beat at a normal rate. Pacemakers are used to treat heart rhythms that are too slow. Wire (leads) are attached to the pacemaker that goes into the chambers of you heart. This is done in the hospital and usually requires and overnight stay. Please see the instruction sheet given to you today for more information.  Follow-Up: Follow up to be determined after your pacer procedure.  Thank you for choosing [facility]!!    Possible days: November 25 December 7 December 28  Please call back and let us know which date you prefer.

## 2019-02-05 NOTE — H&P (View-Only) (Signed)
.      Patient Care Team: Burnard Bunting, MD as PCP - General (Internal Medicine)   HPI  [name] is a 85 y.o. female SEEN IN followup the pacemaker originally implanted by Dr. Doreatha Lew 25 years ago.  She has a history of complete heart block paroxysmal atrial fibrillation and severe orthostatic hypotension   She underwent device generator replacement 11/18.  Date Cr K Hgb  10/20 0.91 4.1 13.           Seh fell and broke her collarbone.  Thereafter, she has noted that her pacemaker generator is flopping back-and-forth.  There is some discomfort.  Device interrogation has further shown some gradual deterioration in impedance; it is an Intermedics 431 unipolar lead.  Battery longevity is estimated at 2 years and the device is just 83 years old.   Echocardiogram 9/20 demonstrated normal LV function with moderate aortic stenosis with a mean gradient of 23  DATE TEST EF   9/20 Echo   55-65 % AS mod--mean grad 23                 Past Medical History:  Diagnosis Date  . Arrhythmia    chronic atrial fib  . Atrial fibrillation, chronic ([place])   . Chronic anticoagulation   . Chronic atrial fibrillation ([place]) 01/28/2017  . Chronic venous insufficiency 01/28/2017  . Clavicle fracture 11/20/2017  . Complication of anesthesia    " difficult waking "  . Long term current use of anticoagulant therapy 01/28/2017  . Osteoarthritis   . Presence of permanent cardiac pacemaker 01/28/2017   Original implant 1992 Lead Intermedics 431-04 Generator change 2001 and 2011.  Medtronic generator.   . SSS (sick sinus syndrome) ([place])   . Thyroid disease    hypothyroidism  . TIA (transient ischemic attack)     Past Surgical History:  Procedure Laterality Date  . ABDOMINAL HYSTERECTOMY    . INSERT / REPLACE / REMOVE PACEMAKER     generator change 2011 medtronic sigma  . KNEE SURGERY    . PACEMAKER INSERTION    . PARS PLANA VITRECTOMY Right 07/19/2018   Procedure: VITRECTOMY WITH  VITREOUS BIPOSY & ENDOLASER;  Surgeon: Jalene Mullet, MD;  Location: [facility];  Service: Ophthalmology;  Laterality: Right;  . PPM GENERATOR CHANGEOUT N/A 02/02/2017   Procedure: PPM GENERATOR CHANGEOUT;  Surgeon: Deboraha Sprang, MD;  Location: [facility];  Service: Cardiovascular;  Laterality: N/A;  . REPLACEMENT TOTAL KNEE BILATERAL    . VARICOSE VEIN SURGERY      Current Outpatient Medications  Medication Sig Dispense Refill  . acetaminophen (TYLENOL) 500 MG tablet Take 1,000 mg by mouth 2 (two) times daily.    Marland Kitchen amLODipine (NORVASC) 10 MG tablet Take 10 mg by mouth every evening.     Marland Kitchen atorvastatin (LIPITOR) 20 MG tablet Take 1 tablet (20 mg total) by mouth daily. 30 tablet 0  . celecoxib (CELEBREX) 200 MG capsule Take 200 mg at bedtime by mouth.     . Cholecalciferol (VITAMIN D3 SUPER STRENGTH) 50 MCG (2000 UT) CAPS Take 2,000 Units by mouth daily.    . Cyanocobalamin (B-12) 1000 MCG CAPS Take 1,000 mcg by mouth daily.    . digoxin (LANOXIN) 0.125 MG tablet Take 0.125 mg by mouth daily. Hold for pulse less than 60.    . levothyroxine (SYNTHROID) 137 MCG tablet Take 137 mcg by mouth daily before breakfast.    . polyethylene glycol (MIRALAX / GLYCOLAX) packet Take  17 g by mouth daily. 14 each 0  . sertraline (ZOLOFT) 25 MG tablet Take 25 mg by mouth at bedtime.    Marland Kitchen warfarin (COUMADIN) 2.5 MG tablet Take 2.5 mg by mouth See admin instructions. Take one tablet by mouth on Mondays Wednesdays and Fridays    . warfarin (COUMADIN) 5 MG tablet TAKE 1/2 TO 1 TABLET DAILY AS DIRECTED BY COUMADIN CLINIC (Patient taking differently: Take 5 mg by mouth See admin instructions. Take 5mg  by mouth Tuesdays Thursdays Saturdays and Sundays) 30 tablet 1   No current facility-administered medications for this visit.     Allergies  Allergen Reactions  . Adhesive [Tape] Other (See Comments)    TAPE WILL TEAR THE SKIN!!!!  . Penicillins Hives and Rash    Has patient had a PCN reaction causing  immediate rash, facial/tongue/throat swelling, SOB or lightheadedness with hypotension: Yes Has patient had a PCN reaction causing severe rash involving mucus membranes or skin necrosis: Yes Has patient had a PCN reaction that required hospitalization: Was IN the hosp Has patient had a PCN reaction occurring within the last 10 years: No If all of the above answers are "NO", then may proceed with Cephalosporin use.       Review of Systems negative except from HPI and PMH  Physical Exam BP 126/80   Pulse 79   Ht 5\' 4"  (1.626 m)   Wt 144 lb (65.3 kg)   SpO2 97%   BMI 24.72 kg/m  Well developed and well nourished in no acute distress HENT normal Neck supple with JVP-flat Clear Device pocket well healed; without hematoma or erythema.  There is no tethering  Regular rate and rhythm, 2/6 systolic murmur Abd-soft with active BS No Clubbing cyanosis chronic edema with significant venous varicosities Skin-warm and dry A & Oriented  Grossly normal sensory and motor function  ECG *was reviewed from 12/11/2018 ventricular pacing with atrial fibrillation   Assessment and  Plan  Intermittent complete heart block with intrinsic conduction today  Orthostatic hypotension  Hypertension-severe  Atrial fibrillation permanent  Left leg swelling  Pacemaker-Medtronic    Impending RV lead failure  Pacemaker anchoring sutures almost certainly become dislodged.  And there is some risk for rotation of the lead.  There is gradual evidence of lead failure as noted previously.  Today she is without intrinsic conduction.  Hence, given the issue of lead integrity and device rotation we will undertake generator explant with a new implantation ipsilaterally if the vein is open and contralaterally if the vein is occluded.  The benefits and risks were reviewed including but not limited to death,  perforation, infection, lead dislodgement and device malfunction.  The patient understands agrees and is  willing to proceed.  We will need to continue to be attentive to orthostatic hypotension.  She and her daughter are also struggling with the issue of caregivers and attentiveness.  I suggest they read Being Mortal    We spent more than 50% of our >25 min visit in face to face counseling regarding the above      Current medicines are reviewed at length with the patient today .  The patient does not  have concerns regarding medicines.

## 2019-02-06 ENCOUNTER — Telehealth: Payer: Self-pay | Admitting: Internal Medicine

## 2019-02-06 ENCOUNTER — Ambulatory Visit (INDEPENDENT_AMBULATORY_CARE_PROVIDER_SITE_OTHER): Payer: Medicare Other | Admitting: *Deleted

## 2019-02-06 ENCOUNTER — Other Ambulatory Visit: Payer: Self-pay | Admitting: Internal Medicine

## 2019-02-06 DIAGNOSIS — I495 Sick sinus syndrome: Secondary | ICD-10-CM

## 2019-02-06 DIAGNOSIS — I482 Chronic atrial fibrillation, unspecified: Secondary | ICD-10-CM | POA: Diagnosis not present

## 2019-02-06 DIAGNOSIS — T82110A Breakdown (mechanical) of cardiac electrode, initial encounter: Secondary | ICD-10-CM

## 2019-02-06 NOTE — Telephone Encounter (Signed)
New Message   Pt would like a callback to discuss dates for pacemaker procedure  Please call back to discuss

## 2019-02-06 NOTE — Telephone Encounter (Signed)
Call returned to Pt.  Pt scheduled for procedure on December 7  Labs/covid test scheduled.  Work up complete.

## 2019-02-07 ENCOUNTER — Telehealth: Payer: Self-pay | Admitting: Internal Medicine

## 2019-02-07 LAB — CUP PACEART REMOTE DEVICE CHECK
Battery Impedance: 993 Ohm
Battery Remaining Longevity: 10 mo
Battery Voltage: 2.68 V
Brady Statistic RV Percent Paced: 100 %
Date Time Interrogation Session: 20201117162140
Implantable Lead Implant Date: 20181114
Implantable Lead Location: 753860
Implantable Pulse Generator Implant Date: 20181114
Lead Channel Impedance Value: 0 Ohm
Lead Channel Impedance Value: 153 Ohm
Lead Channel Setting Pacing Amplitude: 4 V
Lead Channel Setting Pacing Pulse Width: 1 ms
Lead Channel Setting Sensing Sensitivity: 4 mV

## 2019-02-07 NOTE — Telephone Encounter (Signed)
New message   Patient's daughter has questions about the procedure that is scheduled for the 02/26/2019. Please call to discuss.

## 2019-02-07 NOTE — Telephone Encounter (Signed)
lp daughter message. 11/18

## 2019-02-07 NOTE — Telephone Encounter (Signed)
Follow up   Daughter returning call Nurse unavailable . Please return call

## 2019-02-07 NOTE — Telephone Encounter (Signed)
I spoke to the patient's daughter and told her that I would mail her the Pacemaker instructions for her mother, the patient.  She thanked me for the call.

## 2019-02-12 ENCOUNTER — Telehealth: Payer: Self-pay | Admitting: Internal Medicine

## 2019-02-12 NOTE — Telephone Encounter (Signed)
New Message:   Please call pt's daughter/ She says pt is now scared to have pacemaker implant, She wants to know if it absolutely necessary to have it did at at this time?

## 2019-02-12 NOTE — Telephone Encounter (Signed)
Spoke with patient's daughter, [name] [facility]). She reports pt watches [employer] all day, very concerned that she will contract COVID during hospitalization for lead revision/PPM generator replacement. Explained reasoning for procedure due to impending RV lead failure. [name] requested recommendations from Dr. Caryl Comes regarding urgency of procedure, possibility of rescheduling for sometime in the future. Advised I will try to discuss with Dr. Caryl Comes prior to pt's lab appointment tomorrow. [name] in agreement with plan, no further questions at this time.

## 2019-02-13 ENCOUNTER — Other Ambulatory Visit: Payer: Medicare Other

## 2019-02-13 ENCOUNTER — Ambulatory Visit
Admission: RE | Admit: 2019-02-13 | Discharge: 2019-02-13 | Disposition: A | Discharge status: Home / Self Care | Payer: Medicare Other | Source: Ambulatory Visit | Attending: Internal Medicine | Admitting: Internal Medicine

## 2019-02-13 ENCOUNTER — Other Ambulatory Visit: Payer: Self-pay

## 2019-02-13 ENCOUNTER — Ambulatory Visit (INDEPENDENT_AMBULATORY_CARE_PROVIDER_SITE_OTHER): Payer: Medicare Other | Admitting: *Deleted

## 2019-02-13 DIAGNOSIS — I998 Other disorder of circulatory system: Secondary | ICD-10-CM

## 2019-02-13 DIAGNOSIS — I482 Chronic atrial fibrillation, unspecified: Secondary | ICD-10-CM | POA: Diagnosis not present

## 2019-02-13 DIAGNOSIS — T82110A Breakdown (mechanical) of cardiac electrode, initial encounter: Secondary | ICD-10-CM

## 2019-02-13 DIAGNOSIS — Z5181 Encounter for therapeutic drug level monitoring: Secondary | ICD-10-CM | POA: Diagnosis not present

## 2019-02-13 DIAGNOSIS — I495 Sick sinus syndrome: Secondary | ICD-10-CM | POA: Diagnosis not present

## 2019-02-13 DIAGNOSIS — J439 Emphysema, unspecified: Secondary | ICD-10-CM | POA: Diagnosis not present

## 2019-02-13 DIAGNOSIS — Z950 Presence of cardiac pacemaker: Secondary | ICD-10-CM

## 2019-02-13 LAB — CBC WITH DIFFERENTIAL/PLATELET
Basophils Absolute: 0.1 10*3/uL (ref 0.0–0.2)
Basos: 1 %
EOS (ABSOLUTE): 0.2 10*3/uL (ref 0.0–0.4)
Eos: 3 %
Hematocrit: 36.8 % (ref 34.0–46.6)
Hemoglobin: 12.4 g/dL (ref 11.1–15.9)
Lymphocytes Absolute: 1.2 10*3/uL (ref 0.7–3.1)
Lymphs: 20 %
MCH: 29.1 pg (ref 26.6–33.0)
MCHC: 33.7 g/dL (ref 31.5–35.7)
MCV: 86 fL (ref 79–97)
Monocytes Absolute: 0.8 10*3/uL (ref 0.1–0.9)
Monocytes: 14 %
Neutrophils Absolute: 3.8 10*3/uL (ref 1.4–7.0)
Neutrophils: 62 %
Platelets: 228 10*3/uL (ref 150–450)
RBC: 4.26 x10E6/uL (ref 3.77–5.28)
RDW: 13.4 % (ref 11.7–15.4)
WBC: 6 10*3/uL (ref 3.4–10.8)

## 2019-02-13 LAB — BASIC METABOLIC PANEL
BUN/Creatinine Ratio: 16 (ref 12–28)
BUN: 19 mg/dL (ref 8–27)
CO2: 25 mmol/L (ref 20–29)
Calcium: 9.1 mg/dL (ref 8.7–10.3)
Chloride: 103 mmol/L (ref 96–106)
Creatinine, Ser: 1.17 mg/dL — ABNORMAL HIGH (ref 0.57–1.00)
GFR calc Af Amer: 49 mL/min/{1.73_m2} — ABNORMAL LOW (ref >59)
GFR calc non Af Amer: 42 mL/min/{1.73_m2} — ABNORMAL LOW (ref >59)
Glucose: 119 mg/dL — ABNORMAL HIGH (ref 65–99)
Potassium: 4.4 mmol/L (ref 3.5–5.2)
Sodium: 137 mmol/L (ref 134–144)

## 2019-02-13 LAB — PROTIME-INR
INR: 4.6 — ABNORMAL HIGH (ref 0.9–1.2)
Prothrombin Time: 48.8 s — ABNORMAL HIGH (ref 9.1–12.0)

## 2019-02-13 LAB — POCT INR: INR: 4 — AB (ref 2.0–3.0)

## 2019-02-13 NOTE — Patient Instructions (Addendum)
Description   Do not take any Warfarin today and take 2.5mg  tomorrow then continue taking 5mg  daily except 2.5mg  on Tuesdays, Thursdays and Sundays. Recheck INR in 1 week after procedure. Call Coumadin Clinic for any questions, upcoming procedures or changes in medications. #  [phone], Main # [phone]

## 2019-02-14 NOTE — Telephone Encounter (Signed)
Dr. Caryl Comes discussed options with patient during lab/coumadin visit on 02/13/19. CXR ordered to assess lead, determine plan after review.

## 2019-02-14 NOTE — Telephone Encounter (Signed)
CXR demonstrates coiling of the RV lead   Called and spoke with daughter About the coiling of the RV lead This is more concerning than if there were no lead rotation.  I am inclined towards urgency but not emergency nor electivity  Her moms concern has been the logistics and the fear of COVID   Transient aphasia and droop in September  We discussed the consequences of lead fracture, it would not be a stroke as was her fear but likely death with asystole;- this might be a preferred mode of dying as opposed to alternative modes,. ie CVA, fall, pneumonia progressive dementia   Noted that asystole result could also be a fall with an escape rate  Given mild dementia, age and fear of COVID the Daughter will talk with the patient about deferring the procedure unitl after the 2nd wave

## 2019-02-19 ENCOUNTER — Telehealth: Payer: Self-pay | Admitting: Internal Medicine

## 2019-02-19 NOTE — Telephone Encounter (Signed)
Reviewed the pacemaker procedure and instruction letter in depth to patients daughter who lives out of town but will be coming in to take care of her mother post procedure. She verbalized understanding.

## 2019-02-19 NOTE — Telephone Encounter (Signed)
Patient's daughter calling to confirm that her mother is going to follow through with the procedure on 02/26/19. Asking that Dr. Caryl Comes or his nurse give her a call in regards to the procedure.

## 2019-02-22 DIAGNOSIS — G5603 Carpal tunnel syndrome, bilateral upper limbs: Secondary | ICD-10-CM | POA: Diagnosis not present

## 2019-02-22 DIAGNOSIS — G5602 Carpal tunnel syndrome, left upper limb: Secondary | ICD-10-CM | POA: Diagnosis not present

## 2019-02-22 DIAGNOSIS — G5601 Carpal tunnel syndrome, right upper limb: Secondary | ICD-10-CM | POA: Diagnosis not present

## 2019-02-23 ENCOUNTER — Other Ambulatory Visit (HOSPITAL_COMMUNITY)
Admission: RE | Admit: 2019-02-23 | Discharge: 2019-02-23 | Disposition: A | Discharge status: Home / Self Care | Payer: Medicare Other | Source: Ambulatory Visit | Attending: Internal Medicine | Admitting: Internal Medicine

## 2019-02-23 ENCOUNTER — Telehealth: Payer: Self-pay

## 2019-02-23 ENCOUNTER — Inpatient Hospital Stay (HOSPITAL_COMMUNITY): Admission: RE | Admit: 2019-02-23 | Payer: Medicare Other | Source: Ambulatory Visit

## 2019-02-23 DIAGNOSIS — Z20828 Contact with and (suspected) exposure to other viral communicable diseases: Secondary | ICD-10-CM | POA: Diagnosis not present

## 2019-02-23 DIAGNOSIS — Z01812 Encounter for preprocedural laboratory examination: Secondary | ICD-10-CM | POA: Insufficient documentation

## 2019-02-23 LAB — SARS CORONAVIRUS 2 (TAT 6-24 HRS): SARS Coronavirus 2: NEGATIVE

## 2019-02-23 NOTE — Telephone Encounter (Signed)
Pts daughter presented in the lobby to ask about pre procedure lab work. Pt has already had her COVID screening and will need to remain quarantined. I advised pts daughter that pt can come in 20 minutes early for her procedure, short stay can draw her lab work.

## 2019-02-26 ENCOUNTER — Other Ambulatory Visit: Payer: Self-pay

## 2019-02-26 ENCOUNTER — Ambulatory Visit (HOSPITAL_COMMUNITY)
Admission: RE | Admit: 2019-02-26 | Discharge: 2019-02-26 | Disposition: A | Discharge status: Home / Self Care | Payer: Medicare Other | Attending: Internal Medicine | Admitting: Internal Medicine

## 2019-02-26 DIAGNOSIS — Z01818 Encounter for other preprocedural examination: Secondary | ICD-10-CM | POA: Diagnosis not present

## 2019-02-26 DIAGNOSIS — Z950 Presence of cardiac pacemaker: Secondary | ICD-10-CM

## 2019-02-26 LAB — PROTIME-INR
INR: 3.4 — ABNORMAL HIGH (ref 0.8–1.2)
Prothrombin Time: 34.4 seconds — ABNORMAL HIGH (ref 11.4–15.2)

## 2019-02-26 LAB — SURGICAL PCR SCREEN
MRSA, PCR: NEGATIVE
Staphylococcus aureus: NEGATIVE

## 2019-02-26 MED ORDER — SODIUM CHLORIDE 0.9 % IV SOLN
INTRAVENOUS | Status: AC
  Filled 2019-02-26: qty 2

## 2019-02-26 MED ORDER — MUPIROCIN 2 % EX OINT
TOPICAL_OINTMENT | CUTANEOUS | Status: AC
  Administered 2019-02-26: 1 via TOPICAL
  Filled 2019-02-26: qty 22

## 2019-02-26 MED ORDER — SODIUM CHLORIDE 0.9 % IV SOLN: INTRAVENOUS | Status: DC

## 2019-02-26 MED ORDER — SODIUM CHLORIDE 0.9 % IV SOLN
INTRAVENOUS | Status: DC
  Administered 2019-02-26: 09:00:00 via INTRAVENOUS

## 2019-02-26 MED ORDER — MUPIROCIN 2 % EX OINT
1.0000 "application " | TOPICAL_OINTMENT | Freq: Once | CUTANEOUS | Status: AC
  Administered 2019-02-26: 10:00:00 1 via TOPICAL
  Filled 2019-02-26: qty 22

## 2019-02-26 MED ORDER — VANCOMYCIN HCL IN DEXTROSE 1-5 GM/200ML-% IV SOLN: 1000.0000 mg | INTRAVENOUS | Status: DC

## 2019-02-26 MED ORDER — VANCOMYCIN HCL IN DEXTROSE 1-5 GM/200ML-% IV SOLN
INTRAVENOUS | Status: AC
  Filled 2019-02-26: qty 200

## 2019-02-26 MED ORDER — SODIUM CHLORIDE 0.9 % IV SOLN: 80.0000 mg | INTRAVENOUS | Status: DC

## 2019-02-26 MED ORDER — CHLORHEXIDINE GLUCONATE 4 % EX LIQD: 4.0000 "application " | Freq: Once | CUTANEOUS | Status: DC

## 2019-03-02 ENCOUNTER — Inpatient Hospital Stay (HOSPITAL_COMMUNITY): Admission: RE | Admit: 2019-03-02 | Payer: Medicare Other | Source: Ambulatory Visit

## 2019-03-02 ENCOUNTER — Other Ambulatory Visit (HOSPITAL_COMMUNITY)
Admit: 2019-03-02 | Discharge: 2019-03-02 | Disposition: A | Discharge status: Home / Self Care | Payer: Medicare Other | Source: Ambulatory Visit | Attending: Internal Medicine | Admitting: Internal Medicine

## 2019-03-02 ENCOUNTER — Ambulatory Visit (INDEPENDENT_AMBULATORY_CARE_PROVIDER_SITE_OTHER): Payer: Medicare Other | Admitting: *Deleted

## 2019-03-02 ENCOUNTER — Other Ambulatory Visit: Payer: Self-pay

## 2019-03-02 DIAGNOSIS — Z5181 Encounter for therapeutic drug level monitoring: Secondary | ICD-10-CM | POA: Diagnosis not present

## 2019-03-02 DIAGNOSIS — I482 Chronic atrial fibrillation, unspecified: Secondary | ICD-10-CM | POA: Diagnosis not present

## 2019-03-02 DIAGNOSIS — Z01812 Encounter for preprocedural laboratory examination: Secondary | ICD-10-CM | POA: Diagnosis present

## 2019-03-02 DIAGNOSIS — Z20828 Contact with and (suspected) exposure to other viral communicable diseases: Secondary | ICD-10-CM | POA: Insufficient documentation

## 2019-03-02 DIAGNOSIS — I998 Other disorder of circulatory system: Secondary | ICD-10-CM | POA: Diagnosis not present

## 2019-03-02 LAB — SARS CORONAVIRUS 2 (TAT 6-24 HRS): SARS Coronavirus 2: NEGATIVE

## 2019-03-02 LAB — POCT INR: INR: 2.4 (ref 2.0–3.0)

## 2019-03-02 NOTE — Patient Instructions (Addendum)
Description   Continue with the instructions Dr. Caryl Comes has instructed you to do for your procedure on 03/05/2019. Normal dose: 5mg  daily except 2.5mg  on Tuesdays, Thursdays and Sundays. Recheck INR in 1 week after procedure. Call Coumadin Clinic for any questions, upcoming procedures or changes in medications. #  [id number], Main # [phone]

## 2019-03-05 ENCOUNTER — Other Ambulatory Visit: Payer: Self-pay

## 2019-03-05 ENCOUNTER — Ambulatory Visit (HOSPITAL_COMMUNITY)
Admission: RE | Disposition: A | Discharge status: Home / Self Care | Payer: Self-pay | Source: Home / Self Care | Attending: Internal Medicine

## 2019-03-05 ENCOUNTER — Ambulatory Visit (HOSPITAL_COMMUNITY)
Admission: RE | Admit: 2019-03-05 | Discharge: 2019-03-06 | Disposition: A | Discharge status: Home / Self Care | Payer: Medicare Other | Attending: Internal Medicine | Admitting: Internal Medicine

## 2019-03-05 DIAGNOSIS — Z79890 Hormone replacement therapy (postmenopausal): Secondary | ICD-10-CM | POA: Insufficient documentation

## 2019-03-05 DIAGNOSIS — I442 Atrioventricular block, complete: Secondary | ICD-10-CM | POA: Diagnosis not present

## 2019-03-05 DIAGNOSIS — T82110A Breakdown (mechanical) of cardiac electrode, initial encounter: Secondary | ICD-10-CM | POA: Diagnosis not present

## 2019-03-05 DIAGNOSIS — I10 Essential (primary) hypertension: Secondary | ICD-10-CM | POA: Insufficient documentation

## 2019-03-05 DIAGNOSIS — Z79899 Other long term (current) drug therapy: Secondary | ICD-10-CM | POA: Insufficient documentation

## 2019-03-05 DIAGNOSIS — Z7901 Long term (current) use of anticoagulants: Secondary | ICD-10-CM | POA: Insufficient documentation

## 2019-03-05 DIAGNOSIS — I951 Orthostatic hypotension: Secondary | ICD-10-CM | POA: Diagnosis not present

## 2019-03-05 DIAGNOSIS — E039 Hypothyroidism, unspecified: Secondary | ICD-10-CM | POA: Diagnosis not present

## 2019-03-05 DIAGNOSIS — Z959 Presence of cardiac and vascular implant and graft, unspecified: Secondary | ICD-10-CM

## 2019-03-05 DIAGNOSIS — Z8673 Personal history of transient ischemic attack (TIA), and cerebral infarction without residual deficits: Secondary | ICD-10-CM | POA: Insufficient documentation

## 2019-03-05 DIAGNOSIS — Z880 Allergy status to penicillin: Secondary | ICD-10-CM | POA: Diagnosis not present

## 2019-03-05 DIAGNOSIS — I872 Venous insufficiency (chronic) (peripheral): Secondary | ICD-10-CM | POA: Insufficient documentation

## 2019-03-05 DIAGNOSIS — I4821 Permanent atrial fibrillation: Secondary | ICD-10-CM | POA: Insufficient documentation

## 2019-03-05 DIAGNOSIS — Z45010 Encounter for checking and testing of cardiac pacemaker pulse generator [battery]: Secondary | ICD-10-CM | POA: Diagnosis not present

## 2019-03-05 DIAGNOSIS — M1990 Unspecified osteoarthritis, unspecified site: Secondary | ICD-10-CM | POA: Insufficient documentation

## 2019-03-05 HISTORY — PX: PACEMAKER IMPLANT: EP1218

## 2019-03-05 HISTORY — PX: LEAD INSERTION: EP1212

## 2019-03-05 LAB — PROTIME-INR
INR: 2.3 — ABNORMAL HIGH (ref 0.8–1.2)
Prothrombin Time: 25 seconds — ABNORMAL HIGH (ref 11.4–15.2)

## 2019-03-05 SURGERY — PACEMAKER IMPLANT

## 2019-03-05 MED ORDER — FENTANYL CITRATE (PF) 100 MCG/2ML IJ SOLN
INTRAMUSCULAR | Status: DC | PRN
  Administered 2019-03-05 (×2): 12.5 ug via INTRAVENOUS

## 2019-03-05 MED ORDER — VANCOMYCIN HCL IN DEXTROSE 1-5 GM/200ML-% IV SOLN
1000.0000 mg | Freq: Two times a day (BID) | INTRAVENOUS | Status: AC
  Administered 2019-03-05: 1000 mg via INTRAVENOUS
  Filled 2019-03-05: qty 200

## 2019-03-05 MED ORDER — CALCIUM CARBONATE ANTACID 500 MG PO CHEW: 1.0000 | CHEWABLE_TABLET | Freq: Every day | ORAL | Status: DC | PRN

## 2019-03-05 MED ORDER — DIGOXIN 125 MCG PO TABS
0.1250 mg | ORAL_TABLET | Freq: Every day | ORAL | Status: DC
  Administered 2019-03-06: 0.125 mg via ORAL
  Filled 2019-03-05: qty 1

## 2019-03-05 MED ORDER — VANCOMYCIN HCL IN DEXTROSE 1-5 GM/200ML-% IV SOLN
INTRAVENOUS | Status: AC
  Filled 2019-03-05: qty 200

## 2019-03-05 MED ORDER — SODIUM CHLORIDE 0.9 % IV SOLN
80.0000 mg | INTRAVENOUS | Status: AC
  Administered 2019-03-05: 80 mg

## 2019-03-05 MED ORDER — OXYBUTYNIN CHLORIDE ER 5 MG PO TB24
5.0000 mg | ORAL_TABLET | Freq: Every day | ORAL | Status: DC
  Administered 2019-03-05: 5 mg via ORAL
  Filled 2019-03-05 (×2): qty 1

## 2019-03-05 MED ORDER — CELECOXIB 200 MG PO CAPS
200.0000 mg | ORAL_CAPSULE | Freq: Every day | ORAL | Status: DC
  Administered 2019-03-05: 200 mg via ORAL
  Filled 2019-03-05 (×2): qty 1

## 2019-03-05 MED ORDER — LIDOCAINE HCL 1 % IJ SOLN
INTRAMUSCULAR | Status: AC
  Filled 2019-03-05: qty 40

## 2019-03-05 MED ORDER — ONDANSETRON HCL 4 MG/2ML IJ SOLN: 4.0000 mg | Freq: Four times a day (QID) | INTRAMUSCULAR | Status: DC | PRN

## 2019-03-05 MED ORDER — ACETAMINOPHEN 500 MG PO TABS: 1000.0000 mg | ORAL_TABLET | Freq: Three times a day (TID) | ORAL | Status: DC | PRN

## 2019-03-05 MED ORDER — LEVOTHYROXINE SODIUM 25 MCG PO TABS
137.0000 ug | ORAL_TABLET | Freq: Every day | ORAL | Status: DC
  Administered 2019-03-06: 137 ug via ORAL
  Filled 2019-03-05: qty 1

## 2019-03-05 MED ORDER — FENTANYL CITRATE (PF) 100 MCG/2ML IJ SOLN
INTRAMUSCULAR | Status: AC
  Filled 2019-03-05: qty 2

## 2019-03-05 MED ORDER — SERTRALINE HCL 50 MG PO TABS
25.0000 mg | ORAL_TABLET | Freq: Every evening | ORAL | Status: DC
  Administered 2019-03-05: 25 mg via ORAL
  Filled 2019-03-05: qty 1

## 2019-03-05 MED ORDER — LIDOCAINE HCL 1 % IJ SOLN
INTRAMUSCULAR | Status: AC
  Filled 2019-03-05: qty 60

## 2019-03-05 MED ORDER — ATORVASTATIN CALCIUM 10 MG PO TABS
20.0000 mg | ORAL_TABLET | Freq: Every evening | ORAL | Status: DC
  Administered 2019-03-05: 20 mg via ORAL
  Filled 2019-03-05: qty 2

## 2019-03-05 MED ORDER — CHLORHEXIDINE GLUCONATE 4 % EX LIQD: 4.0000 "application " | Freq: Once | CUTANEOUS | Status: DC

## 2019-03-05 MED ORDER — SODIUM CHLORIDE 0.9 % IV SOLN
INTRAVENOUS | Status: DC
  Administered 2019-03-05: 07:00:00 via INTRAVENOUS

## 2019-03-05 MED ORDER — AMLODIPINE BESYLATE 10 MG PO TABS: 10.0000 mg | ORAL_TABLET | Freq: Every evening | ORAL | Status: DC

## 2019-03-05 MED ORDER — VITAMIN B-12 1000 MCG PO TABS
1000.0000 ug | ORAL_TABLET | Freq: Every day | ORAL | Status: DC
  Administered 2019-03-06: 1000 ug via ORAL
  Filled 2019-03-05: qty 1

## 2019-03-05 MED ORDER — VANCOMYCIN HCL IN DEXTROSE 1-5 GM/200ML-% IV SOLN
1000.0000 mg | INTRAVENOUS | Status: AC
  Administered 2019-03-05: 1000 mg via INTRAVENOUS

## 2019-03-05 MED ORDER — LIDOCAINE HCL (PF) 1 % IJ SOLN
INTRAMUSCULAR | Status: DC | PRN
  Administered 2019-03-05 (×2): 50 mL

## 2019-03-05 MED ORDER — HEPARIN (PORCINE) IN NACL 1000-0.9 UT/500ML-% IV SOLN
INTRAVENOUS | Status: AC
  Filled 2019-03-05: qty 500

## 2019-03-05 MED ORDER — HEPARIN (PORCINE) IN NACL 1000-0.9 UT/500ML-% IV SOLN
INTRAVENOUS | Status: DC | PRN
  Administered 2019-03-05: 500 mL

## 2019-03-05 MED ORDER — SODIUM CHLORIDE 0.9 % IV SOLN
INTRAVENOUS | Status: AC
  Filled 2019-03-05: qty 2

## 2019-03-05 MED ORDER — ACETAMINOPHEN 325 MG PO TABS: 325.0000 mg | ORAL_TABLET | ORAL | Status: DC | PRN

## 2019-03-05 MED ORDER — FLUTICASONE PROPIONATE 50 MCG/ACT NA SUSP: 1.0000 | Freq: Every day | NASAL | Status: DC | PRN

## 2019-03-05 MED ORDER — SODIUM CHLORIDE 0.9 % IV SOLN
INTRAVENOUS | Status: DC | PRN
  Administered 2019-03-05: 250 mL via INTRAVENOUS

## 2019-03-05 MED ORDER — VITAMIN D 25 MCG (1000 UNIT) PO TABS
2000.0000 [IU] | ORAL_TABLET | Freq: Two times a day (BID) | ORAL | Status: DC
  Administered 2019-03-05 – 2019-03-06 (×2): 2000 [IU] via ORAL
  Filled 2019-03-05 (×2): qty 2

## 2019-03-05 MED ORDER — IOHEXOL 350 MG/ML SOLN
INTRAVENOUS | Status: DC | PRN
  Administered 2019-03-05: 10 mL via INTRAVENOUS

## 2019-03-05 SURGICAL SUPPLY — 12 items
CABLE SURGICAL S-101-97-12 (CABLE) ×3 IMPLANT
HEMOSTAT SURGICEL 2X4 FIBR (HEMOSTASIS) ×3 IMPLANT
IPG PACE AZUR XT SR MRI W1SR01 (Pacemaker) ×1 IMPLANT
KIT LEAD END CAP (Cap) ×1 IMPLANT
KIT MICROPUNCTURE NIT STIFF (SHEATH) ×3 IMPLANT
LEAD END CAP (Cap) ×2 IMPLANT
LEAD ISOFLEX OPT 1948-58CM (Lead) ×3 IMPLANT
PACE AZURE XT SR MRI W1SR01 (Pacemaker) ×3 IMPLANT
PAD PRO RADIOLUCENT 2001M-C (PAD) ×3 IMPLANT
POUCH AIGIS-R ANTIBACT PPM (Mesh General) ×3 IMPLANT
SHEATH 7FR PRELUDE SNAP 13 (SHEATH) ×3 IMPLANT
TRAY PACEMAKER INSERTION (PACKS) ×3 IMPLANT

## 2019-03-05 NOTE — Progress Notes (Signed)
ANTICOAGULATION CONSULT NOTE - Initial Consult  Pharmacy Consult for warfarin >> apixaban Indication: atrial fibrillation  Allergies  Allergen Reactions  . Adhesive [Tape] Other (See Comments)    TAPE WILL TEAR THE SKIN!!!!  . Penicillins Hives and Rash    Did it involve swelling of the face/tongue/throat, SOB, or low BP? No Did it involve sudden or severe rash/hives, skin peeling, or any reaction on the inside of your mouth or nose? Yes Did you need to seek medical attention at a hospital or doctor's office? Unknown  When did it last happen?unknown  If all above answers are "NO", may proceed with cephalosporin use.     Patient Measurements: Height: 5\' 4"  (162.6 cm) Weight: 138 lb (62.6 kg) IBW/kg (Calculated) : 54.7  Vital Signs: Temp: 97.5 F (36.4 C) (12/14 0554) Temp Source: Oral (12/14 0554) BP: 168/59 (12/14 1245) Pulse Rate: 59 (12/14 1245)  Labs: Recent Labs    03/02/19 1359 03/05/19 0554  LABPROT  --  25.0*  INR 2.4 2.3*    Estimated Creatinine Clearance: 29.8 mL/min (A) (by C-G formula based on SCr of 1.17 mg/dL (H)).   Medical History: Past Medical History:  Diagnosis Date  . Arrhythmia    chronic atrial fib  . Atrial fibrillation, chronic ([place])   . Chronic anticoagulation   . Chronic atrial fibrillation ([place]) 01/28/2017  . Chronic venous insufficiency 01/28/2017  . Clavicle fracture 11/20/2017  . Complication of anesthesia    " difficult waking "  . Long term current use of anticoagulant therapy 01/28/2017  . Osteoarthritis   . Presence of permanent cardiac pacemaker 01/28/2017   Original implant 1992 Lead Intermedics 431-04 Generator change 2001 and 2011.  Medtronic generator.   . SSS (sick sinus syndrome) ([place])   . Thyroid disease    hypothyroidism  . TIA (transient ischemic attack)      Assessment: 55 yoF on warfarin PTA admitted for PPM implant. Pharmacy asked to transition warfarin to apixaban. Given recent device implant, EP to  decide on start time of anticoagulation. INR 2.3 this morning so would not start apixaban until INR <2 regardless. Based on wt >60kg and SCr ~1, pt should receive apixaban 5mg  BID.  Plan:  -Hold further warfarin - trend INR -Once INR < 2 and ok by EP, begin apixaban 5mg  BID   Arrie Senate, PharmD, BCPS Clinical Pharmacist [phone] Please check AMION for all [place] numbers 03/05/2019

## 2019-03-05 NOTE — Interval H&P Note (Signed)
History and Physical Interval Note:  03/05/2019 7:36 AM  Gearldine Shown Fiero  has presented today for surgery, with the diagnosis of lead failure.  The various methods of treatment have been discussed with the patient and family. After consideration of risks, benefits and other options for treatment, the patient has consented to  Procedure(s): PACEMAKER IMPLANT (N/A) LEAD INSERTION - RV LEAD (N/A) as a surgical intervention.  The patient's history has been reviewed, patient examined, no change in status, stable for surgery.  I have reviewed the patient's chart and labs.  Questions were answered to the patient's satisfaction.     Virl Axe  Returning today;  last week INR was 3.4  and 2.4 noww on Friday  Plan will be to implant a new system on the left; explant on the Right 2/2 unpredictable end of life behaviour;  will use tyrex pouch on the explant side.

## 2019-03-05 NOTE — Progress Notes (Signed)
DACY FIGURES [id number]  [id number]  Preop Dx: complete heart block Postop Dx same/  Procedure: pacemaker implant and generator removal  Cx: None       Virl Axe, MD 03/05/2019 9:53 AM

## 2019-03-05 NOTE — Discharge Summary (Addendum)
ELECTROPHYSIOLOGY PROCEDURE DISCHARGE SUMMARY    Patient ID: [name],  MRN: [id number], DOB/AGE: 22-May-1930 85 y.o.  Admit date: 03/05/2019 Discharge date: 03/06/19   Primary Care Physician: Burnard Bunting, MD  Electrophysiologist: Dr. 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 Adhesive [Tape] Other (See Comments)    TAPE WILL TEAR THE SKIN!!!!  . Penicillins Hives and Rash    Did it involve swelling of the face/tongue/throat, SOB, or low BP? No Did it involve sudden or severe rash/hives, skin peeling, or any reaction on the inside of your mouth or nose? Yes Did you need to seek medical attention at a hospital or doctor's office? Unknown  When did it last happen?unknown  If all above answers are "NO", may proceed with cephalosporin use.     Procedures This Admission:  1.  Revision of a Medtronic dual chamber PPM system on 03/05/2019 by Dr. Caryl Comes.  The patient received a Medtronic model number [id number] PPM with an ABX pouch, and a St Jude 1948-58 right ventricular lead. Her previous RV lead was capped. There were no immediate post procedure complications. 2.  CXR on 03/06/19 demonstrated no pneumothorax status post device implantation.   Brief HPI: [name] is a 85 y.o. female was seen by electrophysiology in the outpatient setting for her regular PPM follow up with Dr. Caryl Comes. She had a fall and collar bone fracture, with concern for pacemaker anchoring suture dislodgement and impending RV failure due to rotation of the lead. Dr Caryl Comes recommended lead revision and gen change with decreased battery life. Past medical history includes intermittent CHB, orthostatic HTN, atrial fibrillation, OA, and SSS. Due to the above, the risks, benefits, and  alternatives to PPM lead revision and generator change were reviewed with the patient and her daughter who wished to proceed.   Hospital Course:  The patient was admitted and underwent implantation of a new RV lead, and generator change of a Medtronic dual chamber PPM with details as outlined above.  She was monitored on telemetry overnight which demonstrated V pacing.  Left chest was without hematoma or ecchymosis.  The device was interrogated and found to be functioning normally.  CXR was obtained and demonstrated no pneumothorax status post device implantation.  Wound care, arm mobility, and restrictions were reviewed with the patient. Pt has a small area of protrusion inferior to her previous PPM site that appears to be the end of the lead. She says that she felt this prior to the procedure, which wouldn't make sense as the lead would've been connected to the generator.  Will follow. The patient was examined and considered stable for discharge to home.   I personally reviewed the discharge instructions with the patient AND her POA, [name]. Eliquis sent to [facility] so her first month would be provided to her before she left the hospital. She knows to start tomorrow am.   Physical Exam: Vitals:   03/06/19 0051 03/06/19 0542 03/06/19 0807 03/06/19 0908  BP: (!) 143/69 (!) 163/60 (!) 138/52   Pulse: 60 (!) 59 (!) 59 60  Resp: 19 18 18    Temp: 97.6 F (36.4 C) 98.4 F (36.9 C) 97.8 F (36.6 C)   TempSrc: Oral Oral Oral   SpO2: 94% 97% 96%   Weight:  65.1 kg    Height:  GEN- The patient is elderly appearing, alert and oriented x 3 today.   HEENT: normocephalic, atraumatic; sclera clear, conjunctiva pink; hearing intact; oropharynx clear; neck supple, no JVP Lymph- no cervical lymphadenopathy Lungs- Clear to ausculation bilaterally, normal work of breathing.  No wheezes, rales, rhonchi Heart- Regular rate and rhythm, no murmurs, rubs or gallops, PMI not laterally displaced GI- soft,  non-tender, non-distended, bowel sounds present, no hepatosplenomegaly Extremities- no clubbing, cyanosis, or edema; DP/PT/radial pulses 2+ bilaterally MS- no significant deformity or atrophy Skin- warm and dry, no rash or lesion, left chest without hematoma/ecchymosis Psych- euthymic mood, full affect Neuro- strength and sensation are intact  Labs:   Lab Results  Component Value Date   WBC 6.0 02/13/2019   HGB 12.4 02/13/2019   HCT 36.8 02/13/2019   MCV 86 02/13/2019   PLT 228 02/13/2019   No results for input(s): NA, K, CL, CO2, BUN, CREATININE, CALCIUM, PROT, BILITOT, ALKPHOS, ALT, AST, GLUCOSE in the last 168 hours.  Invalid input(s): LABALBU  Discharge Medications:  Allergies as of 03/06/2019      Reactions   Adhesive [tape] Other (See Comments)   TAPE WILL TEAR THE SKIN!!!!   Penicillins Hives, Rash   Did it involve swelling of the face/tongue/throat, SOB, or low BP? No Did it involve sudden or severe rash/hives, skin peeling, or any reaction on the inside of your mouth or nose? Yes Did you need to seek medical attention at a hospital or doctor's office? Unknown  When did it last happen?unknown  If all above answers are "NO", may proceed with cephalosporin use.      Medication List    STOP taking these medications   warfarin 2.5 MG tablet Commonly known as: COUMADIN   warfarin 5 MG tablet Commonly known as: COUMADIN     TAKE these medications   acetaminophen 500 MG tablet Commonly known as: TYLENOL Take 1,000 mg by mouth every 8 (eight) hours as needed for moderate pain or headache.   amLODipine 10 MG tablet Commonly known as: NORVASC Take 10 mg by mouth every evening.   apixaban 5 MG Tabs tablet Commonly known as: Eliquis Take 1 tablet (5 mg total) by mouth 2 (two) times daily. Start taking on: March 07, 2019   atorvastatin 20 MG tablet Commonly known as: LIPITOR Take 1 tablet (20 mg total) by mouth daily. What changed: when to take this     B-12 1000 MCG Caps Take 1,000 mcg by mouth daily.   calcium carbonate 500 MG chewable tablet Commonly known as: TUMS - dosed in mg elemental calcium Chew 1-2 tablets by mouth daily as needed for indigestion or heartburn.   celecoxib 200 MG capsule Commonly known as: CELEBREX Take 200 mg at bedtime by mouth.   digoxin 0.125 MG tablet Commonly known as: LANOXIN Take 0.125 mg by mouth daily. Hold for pulse less than 60.   fluticasone 50 MCG/ACT nasal spray Commonly known as: FLONASE Place 1 spray into both nostrils daily as needed for allergies or rhinitis.   GENTEAL OP Place 1 drop into both eyes daily as needed (dry eyes).   levothyroxine 137 MCG tablet Commonly known as: SYNTHROID Take 137 mcg by mouth daily before breakfast.   oxybutynin 5 MG 24 hr tablet Commonly known as: DITROPAN-XL Take 5 mg by mouth at bedtime.   polyethylene glycol 17 g packet Commonly known as: MIRALAX / GLYCOLAX Take 17 g by mouth daily. What changed:   when to take this  reasons to  take this   sertraline 25 MG tablet Commonly known as: ZOLOFT Take 25 mg by mouth every evening.   Vitamin D 50 MCG (2000 UT) tablet Take 2,000 Units by mouth 2 (two) times daily.       Disposition:   Follow-up Information    [facility] Follow up on 03/14/2019.   Why: at 1:25 pm for pacemaker wound check. Please arrive by 110 pm. (at Dr. Aquilla Hacker office on [facility]) Contact information: [place] [phone]       Deboraha Sprang, MD Follow up on 06/07/2019.   Specialty: Cardiology Why: at 300 pm for 3 month pacemaker check Contact information: [address] [place] [address] [phone]        Burnard Bunting, MD.   Specialty: Internal Medicine Why: Call office for appt. Contact information: [address] [place] [address] [phone]            Duration of Discharge Encounter: Greater than 30 minutes including physician time.  Signed, Shirley Friar, PA-C  03/06/2019 1:59 PM   I have seen, examined the patient, and reviewed the above assessment and plan.  Changes to above are made where necessary.  On exam, RRR.  Doing well s/p PPM implant by Dr Caryl Comes.  Device interrogation is reviewed and normal.  CXR reveals stable leads, no ptx.  Co Sign: Thompson Grayer, MD 03/06/2019 3:41 PM

## 2019-03-06 ENCOUNTER — Ambulatory Visit (HOSPITAL_COMMUNITY): Payer: Medicare Other

## 2019-03-06 DIAGNOSIS — I951 Orthostatic hypotension: Secondary | ICD-10-CM | POA: Diagnosis not present

## 2019-03-06 DIAGNOSIS — I10 Essential (primary) hypertension: Secondary | ICD-10-CM | POA: Diagnosis not present

## 2019-03-06 DIAGNOSIS — I442 Atrioventricular block, complete: Secondary | ICD-10-CM

## 2019-03-06 DIAGNOSIS — Z45010 Encounter for checking and testing of cardiac pacemaker pulse generator [battery]: Secondary | ICD-10-CM | POA: Diagnosis not present

## 2019-03-06 LAB — PROTIME-INR
INR: 1.8 — ABNORMAL HIGH (ref 0.8–1.2)
Prothrombin Time: 20.6 seconds — ABNORMAL HIGH (ref 11.4–15.2)

## 2019-03-06 MED ORDER — APIXABAN 5 MG PO TABS: 5.0000 mg | ORAL_TABLET | Freq: Two times a day (BID) | ORAL | 6 refills | Status: DC

## 2019-03-06 MED FILL — Lidocaine HCl Local Inj 1%: INTRAMUSCULAR | Qty: 100 | Status: AC

## 2019-03-06 MED FILL — ELIQUIS 5 MG TABLET: 5 | 30 days supply | Qty: 60 | Fill #0

## 2019-03-06 NOTE — TOC Transition Note (Signed)
Transition of Care [facility]) - CM/SW Discharge Note   Patient Details  Name: [name] MRN: [id number] Date of Birth: 10/27/1930  [facility]) CM/SW Contact:  Zenon Mayo, RN Phone Number: 03/06/2019, 1:09 PM   Clinical Narrative:    Patient for dc, NCM informed her of her copay for eliquis, [facility] filled her 30 day free.   Final next level of care: Home/Self Care Barriers to Discharge: No Barriers Identified   Patient Goals and CMS Choice        Discharge Placement                       Discharge Plan and Services                DME Arranged: (NA)         HH Arranged: NA          Social Determinants of Health (SDOH) Interventions     Readmission Risk Interventions No flowsheet data found.

## 2019-03-06 NOTE — Care Management (Signed)
Per  [facility]@ [phone] Co-pay amount for Eliquis 2.5mg  or 5 mg. bid for a 30 day supply retail pharmacy $47.00. Co-pay amount for Eliquis 2.5mg  or 5 mg. Bid for a 90 day supply mail order pharmacy $141.00.  No PA required No Deductible Tier 3 medication Retail pharmacies are: [facility] Mail order pharmacy: [facility]

## 2019-03-06 NOTE — Progress Notes (Signed)
Remote pacemaker transmission.   

## 2019-03-06 NOTE — Discharge Instructions (Signed)
After Your Pacemaker  . You have a Medtronic Pacemaker  . Do not lift your arm above shoulder height for 1 week after your procedure. After 7 days, you may progress as below.     Monday March 12, 2019  Tuesday March 13, 2019 Wednesday March 14, 2019 Thursday March 15, 2019   . Do not lift, push, pull, or carry anything over 10 pounds with the affected arm until 6 weeks (Monday April 16, 2019) after your procedure.   . Do not drive until your wound check, or until instructed by your healthcare provider that you are safe to do so.   . Monitor your pacemaker site for redness, swelling, and drainage. Call the device clinic at [phone] if you experience these symptoms or fever/chills.  . If your incision is sealed with Steri-strips or staples, you may shower 7 days after your procedure. Do not remove the steri-strips or let the shower hit directly on your site. You may wash around your site with soap and water. If your incision is closed with Dermabond/Surgical glue. You may shower 1 day after your pacemaker implant and wash around the site with soap and water. Avoid lotions, ointments, or perfumes over your incision until it is well-healed.  . You may use a hot tub or a pool AFTER your wound check appointment if the incision is completely closed.  . Your Pacemaker may be MRI compatible. We will discuss this at your first follow up/wound check. .   . Remote monitoring is used to monitor your pacemaker from home. This monitoring is scheduled every 91 days by our office. It allows [place] to keep an eye on the functioning of your device to ensure it is working properly. You will routinely see your Electrophysiologist annually (more often if necessary).    Pacemaker Implantation, Care After This sheet gives you information about how to care for yourself after your procedure. Your health care provider may also give you more specific instructions. If you have problems or questions,  contact your health care provider. What can I expect after the procedure? After the procedure, it is common to have:  Mild pain.  Slight bruising.  Some swelling over the incision.  A slight bump over the skin where the device was placed. Sometimes, it is possible to feel the device under the skin. This is normal.  You should received your Pacemaker ID card within 4-8 weeks. Follow these instructions at home: Medicines  Take over-the-counter and prescription medicines only as told by your health care provider.  If you were prescribed an antibiotic medicine, take it as told by your health care provider. Do not stop taking the antibiotic even if you start to feel better. Wound care     Do not remove the bandage on your chest until directed to do so by your health care provider.  After your bandage is removed, you may see pieces of tape called skin adhesive strips over the area where the cut was made (incision site). Let them fall off on their own.  Check the incision site every day to make sure it is not infected, bleeding, or starting to pull apart.  Do not use lotions or ointments near the incision site unless directed to do so.  Keep the incision area clean and dry for 7 days after the procedure or as directed by your health care provider. It takes several weeks for the incision site to completely heal.  Do not take baths, swim, or use a  hot tub for 7-10 days or as otherwise directed by your health care provider. Activity  Do not drive or use heavy machinery while taking prescription pain medicine.  Do not drive for 24 hours if you were given a medicine to help you relax (sedative).  Check with your health care provider before you start to drive or play sports.  Avoid sudden jerking, pulling, or chopping movements that pull your upper arm far away from your body. Avoid these movements for at least 6 weeks or as long as told by your health care provider.  Do not lift your  upper arm above your shoulders for at least 6 weeks or as long as told by your health care provider. This means no tennis, golf, or swimming.  You may go back to work when your health care provider says it is okay. Pacemaker care  You may be shown how to transfer data from your pacemaker through the phone to your health care provider.  Always let all health care providers know about your pacemaker before you have any medical procedures or tests.  Wear a medical ID bracelet or necklace stating that you have a pacemaker. Carry a pacemaker ID card with you at all times.  Your pacemaker battery will last for 5-15 years. Routine checks by your health care provider will let the health care provider know when the battery is starting to run down. The pacemaker will need to be replaced when the battery starts to run down.  Do not use amateur Chief of Staff. Other electrical devices are safe to use, including power tools, lawn mowers, and speakers. If you are unsure of whether something is safe to use, ask your health care provider.  When using your cell phone, hold it to the ear opposite the pacemaker. Do not leave your cell phone in a pocket over the pacemaker.  Avoid places or objects that have a strong electric or magnetic field, including: ? Airport Herbalist. When at the airport, let officials know that you have a pacemaker. ? Power plants. ? Large electrical generators. ? Radiofrequency transmission towers, such as cell phone and radio towers. General instructions  Weigh yourself every day. If you suddenly gain weight, fluid may be building up in your body.  Keep all follow-up visits as told by your health care provider. This is important. Contact a health care provider if:  You gain weight suddenly.  Your legs or feet swell.  It feels like your heart is fluttering or skipping beats (heart palpitations).  You have chills or a fever.  You have more  redness, swelling, or pain around your incisions.  You have more fluid or blood coming from your incisions.  Your incisions feel warm to the touch.  You have pus or a bad smell coming from your incisions. Get help right away if:  You have chest pain.  You have trouble breathing or are short of breath.  You become extremely tired.  You are light-headed or you faint. This information is not intended to replace advice given to you by your health care provider. Make sure you discuss any questions you have with your health care provider.     Information on my medicine - ELIQUIS (apixaban)  Why was Eliquis prescribed for you? Eliquis was prescribed for you to reduce the risk of a blood clot forming that can cause a stroke if you have a medical condition called atrial fibrillation (a type of irregular heartbeat).  What  do You need to know about Eliquis ? Take your Eliquis TWICE DAILY - one tablet in the morning and one tablet in the evening with or without food. If you have difficulty swallowing the tablet whole please discuss with your pharmacist how to take the medication safely.  Take Eliquis exactly as prescribed by your doctor and DO NOT stop taking Eliquis without talking to the doctor who prescribed the medication.  Stopping may increase your risk of developing a stroke.  Refill your prescription before you run out.  After discharge, you should have regular check-up appointments with your healthcare provider that is prescribing your Eliquis.  In the future your dose may need to be changed if your kidney function or weight changes by a significant amount or as you get older.  What do you do if you miss a dose? If you miss a dose, take it as soon as you remember on the same day and resume taking twice daily.  Do not take more than one dose of ELIQUIS at the same time to make up a missed dose.  Important Safety Information A possible side effect of Eliquis is bleeding. You  should call your healthcare provider right away if you experience any of the following: ? Bleeding from an injury or your nose that does not stop. ? Unusual colored urine (red or dark brown) or unusual colored stools (red or black). ? Unusual bruising for unknown reasons. ? A serious fall or if you hit your head (even if there is no bleeding).  Some medicines may interact with Eliquis and might increase your risk of bleeding or clotting while on Eliquis. To help avoid this, consult your healthcare provider or pharmacist prior to using any new prescription or non-prescription medications, including herbals, vitamins, non-steroidal anti-inflammatory drugs (NSAIDs) and supplements.  This website has more information on Eliquis (apixaban): http://www.eliquis.com/eliquis/home

## 2019-03-08 ENCOUNTER — Ambulatory Visit: Payer: Medicare Other

## 2019-03-14 ENCOUNTER — Telehealth: Payer: Self-pay

## 2019-03-14 ENCOUNTER — Ambulatory Visit (INDEPENDENT_AMBULATORY_CARE_PROVIDER_SITE_OTHER): Payer: Medicare Other | Admitting: Family Medicine

## 2019-03-14 ENCOUNTER — Ambulatory Visit (INDEPENDENT_AMBULATORY_CARE_PROVIDER_SITE_OTHER): Payer: Medicare Other | Admitting: Student

## 2019-03-14 ENCOUNTER — Encounter: Payer: Self-pay | Admitting: Family Medicine

## 2019-03-14 ENCOUNTER — Other Ambulatory Visit: Payer: Self-pay

## 2019-03-14 VITALS — BP 124/58 | HR 67 | Temp 98.1°F | Ht 61.0 in | Wt 145.5 lb

## 2019-03-14 DIAGNOSIS — Z7901 Long term (current) use of anticoagulants: Secondary | ICD-10-CM

## 2019-03-14 DIAGNOSIS — Z950 Presence of cardiac pacemaker: Secondary | ICD-10-CM | POA: Diagnosis not present

## 2019-03-14 DIAGNOSIS — Z23 Encounter for immunization: Secondary | ICD-10-CM | POA: Diagnosis not present

## 2019-03-14 DIAGNOSIS — I482 Chronic atrial fibrillation, unspecified: Secondary | ICD-10-CM

## 2019-03-14 DIAGNOSIS — I495 Sick sinus syndrome: Secondary | ICD-10-CM | POA: Diagnosis not present

## 2019-03-14 DIAGNOSIS — E039 Hypothyroidism, unspecified: Secondary | ICD-10-CM | POA: Diagnosis not present

## 2019-03-14 DIAGNOSIS — R7303 Prediabetes: Secondary | ICD-10-CM | POA: Insufficient documentation

## 2019-03-14 LAB — CUP PACEART INCLINIC DEVICE CHECK
Battery Remaining Longevity: 165 mo
Battery Voltage: 3.22 V
Brady Statistic RV Percent Paced: 99.6 %
Date Time Interrogation Session: 20201223135913
Implantable Lead Implant Date: 20201214
Implantable Lead Location: 753860
Implantable Lead Model: 1948
Implantable Pulse Generator Implant Date: 20201214
Lead Channel Impedance Value: 589 Ohm
Lead Channel Impedance Value: 836 Ohm
Lead Channel Pacing Threshold Amplitude: 0.875 V
Lead Channel Pacing Threshold Pulse Width: 0.4 ms
Lead Channel Sensing Intrinsic Amplitude: 5.75 mV
Lead Channel Sensing Intrinsic Amplitude: 5.75 mV
Lead Channel Setting Pacing Amplitude: 3.5 V
Lead Channel Setting Pacing Pulse Width: 0.4 ms
Lead Channel Setting Sensing Sensitivity: 1.2 mV

## 2019-03-14 LAB — TSH: TSH: 0.05 u[IU]/mL — ABNORMAL LOW (ref 0.35–4.50)

## 2019-03-14 NOTE — Telephone Encounter (Signed)
Appreciate the update.   Both are documented in the chart and she saw neurology in october

## 2019-03-14 NOTE — Assessment & Plan Note (Signed)
Reviewed labs. New diagnosis. Discussed regular exercise and healthy eating. Will repeat annually given family history.

## 2019-03-14 NOTE — Assessment & Plan Note (Signed)
Followed by cardiology. On blood thinner. Doing well.

## 2019-03-14 NOTE — Assessment & Plan Note (Signed)
Just had repeat placed 12/14. Follow-up with cardiology. Doing well with good healing.

## 2019-03-14 NOTE — Patient Instructions (Addendum)
It was great to meet you today!  We will check your thyroid today.    I will let you know the results of the lab by phone  You have prediabetes - try to stay active - try to eat healthy

## 2019-03-14 NOTE — Progress Notes (Signed)
Wound check appointment. Dermabond removed. Wound without redness or edema. Incision edges approximated. Very small lip medially with clear drainage on inside of dermabond. No active drainage. No bleeding. No separation. . Normal device function. Thresholds, sensing, and impedances consistent with implant measurements. Device programmed at 3.5V/auto capture programmed on for extra safety margin until 3 month visit. Histogram distribution appropriate for patient and level of activity. No high ventricular rates noted. Patient educated about wound care, arm mobility, lifting restrictions. ROV in 3 months with Dr. Caryl Comes

## 2019-03-14 NOTE — Assessment & Plan Note (Signed)
Last TSH was low. Will repeat today and make dose adjustment if still low.

## 2019-03-14 NOTE — Assessment & Plan Note (Signed)
On apixaban. Tolerating well

## 2019-03-14 NOTE — Progress Notes (Signed)
Subjective:     [name] is a 85 y.o. female presenting for Establish Care (previous PCP Dr Reynaldo Minium)     HPI   #pace maker - just had a new one placed - seeing cardiology today - this is her 5th pace maker - the glue is itchy   #Hypothyroidism Lab Results  Component Value Date   TSH 0.038 (L) 12/12/2018  - On levothyroxine 137 mcg daily - no symptoms currently - did not have a dose adjustment   Review of Systems  Constitutional: Negative for chills and fever.  Cardiovascular: Negative for chest pain.  Endocrine: Negative for cold intolerance and heat intolerance.     Social History   Tobacco Use  Smoking Status Former Smoker  . Packs/day: 0.25  . Years: 10.00  . Pack years: 2.50  . Types: Cigarettes  . Quit date: 30  . Years since quitting: 41.0  Smokeless Tobacco Never Used        Objective:    BP Readings from Last 3 Encounters:  03/14/19 (!) 124/58  03/06/19 (!) 138/52  02/26/19 (!) 146/68   Wt Readings from Last 3 Encounters:  03/14/19 145 lb 8 oz (66 kg)  03/06/19 143 lb 8.3 oz (65.1 kg)  02/26/19 140 lb (63.5 kg)    BP (!) 124/58   Pulse 67   Temp 98.1 F (36.7 C)   Ht 5\' 1"  (1.549 m)   Wt 145 lb 8 oz (66 kg)   SpO2 99%   BMI 27.49 kg/m    Physical Exam Constitutional:      General: She is not in acute distress.    Appearance: She is well-developed. She is not diaphoretic.  HENT:     Right Ear: External ear normal.     Left Ear: External ear normal.     Nose: Nose normal.  Eyes:     Conjunctiva/sclera: Conjunctivae normal.  Cardiovascular:     Rate and Rhythm: Normal rate and regular rhythm.  Pulmonary:     Effort: Pulmonary effort is normal. No respiratory distress.     Breath sounds: Normal breath sounds. No wheezing.  Musculoskeletal:     Cervical back: Neck supple.     Comments: Uses walker  Skin:    General: Skin is warm and dry.     Capillary Refill: Capillary refill takes less than 2 seconds.    Neurological:     Mental Status: She is alert. Mental status is at baseline.  Psychiatric:        Mood and Affect: Mood normal.        Behavior: Behavior normal.           Assessment & Plan:   Problem List Items Addressed This Visit      Cardiovascular and Mediastinum   Chronic atrial fibrillation ([place])    Followed by cardiology. On blood thinner. Doing well.         Endocrine   Hypothyroidism - Primary    Last TSH was low. Will repeat today and make dose adjustment if still low.       Relevant Orders   TSH     Other   Presence of permanent cardiac pacemaker (Chronic)    Just had repeat placed 12/14. Follow-up with cardiology. Doing well with good healing.       Long term current use of anticoagulant therapy (Chronic)    On apixaban. Tolerating well      Prediabetes    Reviewed labs. New  diagnosis. Discussed regular exercise and healthy eating. Will repeat annually given family history.           Return in about 3 months (around 06/12/2019).  [name], MD

## 2019-03-14 NOTE — Telephone Encounter (Signed)
After the visit with Dr Einar Pheasant on the way out patient said she forgot to mention 2 things: 1) She was diagnosed with carpal tunnel of both wrists and was going to do surgery for this but not anymore. She sees [facility] in [place] office for occasional injections for her wrists. She also has a trigger finger (left middle finger) at times. Hard to use her hands do get things done.  2) She had a light stroke and it has been at least 2 months since it happened. She couldn't really remember when it was-patient told this to [facility], [place] on her way out of the office. There is an ER note from September, not sure if that was when she had this.  Sending to Dr Einar Pheasant for [place] review.

## 2019-03-15 ENCOUNTER — Telehealth: Payer: Self-pay | Admitting: Family Medicine

## 2019-03-15 ENCOUNTER — Other Ambulatory Visit: Payer: Self-pay | Admitting: Family Medicine

## 2019-03-15 ENCOUNTER — Other Ambulatory Visit: Payer: Self-pay

## 2019-03-15 DIAGNOSIS — E039 Hypothyroidism, unspecified: Secondary | ICD-10-CM

## 2019-03-15 MED ORDER — APIXABAN 5 MG PO TABS: 5.0000 mg | ORAL_TABLET | Freq: Two times a day (BID) | ORAL | 6 refills | Status: DC

## 2019-03-15 MED ORDER — LEVOTHYROXINE SODIUM 125 MCG PO TABS: 125.0000 ug | ORAL_TABLET | Freq: Every day | ORAL | 3 refills | Status: DC

## 2019-03-15 NOTE — Telephone Encounter (Signed)
Patient advised. Made appointment for labs for February 6th

## 2019-03-15 NOTE — Telephone Encounter (Signed)
Please call patient and let her know that her thyroid is still low.   Would recommend decreasing thyroid medication as discussed.   New prescription for 125 mcg sent to pharmacy.  Return for labs in 6 weeks

## 2019-03-30 ENCOUNTER — Telehealth: Payer: Self-pay | Admitting: Family Medicine

## 2019-03-30 ENCOUNTER — Telehealth: Payer: Self-pay

## 2019-03-30 DIAGNOSIS — F0390 Unspecified dementia without behavioral disturbance: Secondary | ICD-10-CM

## 2019-03-30 NOTE — Telephone Encounter (Signed)
Spoke to pt's daughter, [name].

## 2019-03-30 NOTE — Telephone Encounter (Signed)
[name] called back and left message on triage line stating that she spoke with patient's caregivers with [facility] and they said that patient is considered home bound and to ask provider to put in an emergency referral to Home health for nursing to help with medications and getting this set up due to confusion. Can referral be placed today?

## 2019-03-30 NOTE — Telephone Encounter (Signed)
[name] [facility] has accepted our patient and can go out to the home on Monday April 02, 2019. Called patient and gave her the name of the agency and told her what they would be coming out to the home for, to help her set up her Medications, she understood.[name] will call her this weekend with a time.

## 2019-03-30 NOTE — Telephone Encounter (Signed)
[name] (DPR signed and [name] said is POA).[name] lives in [place] and her brother is on quarantine due to covid. Pt established care with Dr Einar Pheasant on 03/14/19. Pt has dementia; pt changed doctors without family's knowledge. [name] thinks change could be related to previous physician advised pt she should not drive.[name] has multiple concerns; primary is pt is not taking meds correctly. [name] fixed med box for pt but pt told caregiver [name] did it wrong and switched some of meds. Pt has only taken 2 thyroid pills since seen 03/14/19. Pt can be defiant and combative at times. [name] thinks meds need to be locked up and caregivers will give pt her meds but caregivers cannot fix med boxes and [facility] will not [facility]. [name] is not sure if pt is driving or not. Pt has caregivers from 8 AM- 4PM and 8PM - 8 AM but there is 4 hrs when pt is on her own. When pt was driving she got lost several times. One caregiver advised [name] that one of pts vehicles (pt has 2 vehicles) has been moved from usual parking spot. [name] has not contacted the DOT with concerns of pt driving; [name] would prefer if doctor would do that.  [name] said pt med boxes will be out 04/01/19 and susan cannot come to pt until end of Jan. [name] said she would pay a nurse to go once or twice a month to fill boxes. Pt is not exactly homebound but due to covid is home bound at this time. Not sure if [facility] nursing is possible. [name] request cb today. Not sure if Dr Einar Pheasant is online or not. Sending to Dr Einar Pheasant and Dr Silvio Pate.

## 2019-03-30 NOTE — Addendum Note (Signed)
Addended by: Waunita Schooner R on: 03/30/2019 01:02 PM   Modules accepted: Orders

## 2019-03-30 NOTE — Telephone Encounter (Signed)
Please let her know that there are many pharmacies that will package pills in blister packs for each day---to improve proper schedule I would leave to Dr Einar Pheasant about the [facility] issue (but I don't think they let someone know who calls with any concerns). Generally, I would just recommend asking for a road test---but I am not sure they are doing them with COVID.  Overall, this is a common issue with early dementia. Denial can be very strong.

## 2019-03-30 NOTE — Telephone Encounter (Signed)
[name] advised of everything. Will let Charmaine know to try and work on this ASAP.

## 2019-03-30 NOTE — Telephone Encounter (Signed)
Order placed.   Would recommend patient and family member schedule follow-up visit the next time the family member is with the patient.   Virtual visit is acceptable.

## 2019-04-03 ENCOUNTER — Telehealth: Payer: Self-pay | Admitting: *Deleted

## 2019-04-03 DIAGNOSIS — R7303 Prediabetes: Secondary | ICD-10-CM | POA: Diagnosis not present

## 2019-04-03 DIAGNOSIS — Z791 Long term (current) use of non-steroidal anti-inflammatories (NSAID): Secondary | ICD-10-CM | POA: Diagnosis not present

## 2019-04-03 DIAGNOSIS — Z9181 History of falling: Secondary | ICD-10-CM | POA: Diagnosis not present

## 2019-04-03 DIAGNOSIS — Z87891 Personal history of nicotine dependence: Secondary | ICD-10-CM | POA: Diagnosis not present

## 2019-04-03 DIAGNOSIS — I482 Chronic atrial fibrillation, unspecified: Secondary | ICD-10-CM | POA: Diagnosis not present

## 2019-04-03 DIAGNOSIS — E039 Hypothyroidism, unspecified: Secondary | ICD-10-CM | POA: Diagnosis not present

## 2019-04-03 DIAGNOSIS — Z48812 Encounter for surgical aftercare following surgery on the circulatory system: Secondary | ICD-10-CM | POA: Diagnosis not present

## 2019-04-03 DIAGNOSIS — Z7901 Long term (current) use of anticoagulants: Secondary | ICD-10-CM | POA: Diagnosis not present

## 2019-04-03 DIAGNOSIS — I509 Heart failure, unspecified: Secondary | ICD-10-CM | POA: Diagnosis not present

## 2019-04-03 DIAGNOSIS — Z950 Presence of cardiac pacemaker: Secondary | ICD-10-CM | POA: Diagnosis not present

## 2019-04-03 DIAGNOSIS — Z79899 Other long term (current) drug therapy: Secondary | ICD-10-CM | POA: Diagnosis not present

## 2019-04-03 NOTE — Telephone Encounter (Signed)
Medication list, [facility]erral and Office note faxed to Felecia Jan.N.

## 2019-04-03 NOTE — Telephone Encounter (Signed)
Called Diance RN with [facility] to let her know that I faxed the medication list over. [name] RN was at the patients home and requested a verbal order for 3 more visits once a week . I gave her the verbal order to okay 3 more visits with the RN. [name] said that the patient was doing a good job with her medications

## 2019-04-03 NOTE — Telephone Encounter (Signed)
[name] nurse with [name] stated that they got a request to go out and do a nursing evaluation. [name] stated that she did not receive a medication list and she would need that in order to address the concerns about the medications. Fax [phone]

## 2019-04-03 NOTE — Telephone Encounter (Signed)
Appreciate the help.

## 2019-04-09 ENCOUNTER — Telehealth: Payer: Self-pay

## 2019-04-09 NOTE — Telephone Encounter (Signed)
Would recommend office visit to discuss and examine leg

## 2019-04-09 NOTE — Telephone Encounter (Signed)
Pt established care with Dr Einar Pheasant on 03/14/19; pt did not mention swelling of her lt leg at that time. Pt said for 2-3 months pt has had swelling in entire lt leg but lower leg swelling has worsened recently. Pt said when wears the TED hose it make a 1" indention into lt lower leg. Lt leg is weak but no weaker than usual. The swelling in her lt leg goes down completely over night.  Pt has SOB upon exertion on and off; pt said wearing mask all the time since has care giver in the home makes her breathing worse. Overall SOB is no worse than it has been. Now BP 149/73 P 77.pt has been moving around this afternoon also. Pt does not feel like she is in any distress with her breathing or anything else. Pt said she has bruises on both lower legs; no redness on lt lower leg; does not feel warm to touch; no red streaks on lt lower leg. 3 wks ago pt had new pacemaker put in. Pt is not on diuretic. Also 3-4 wks ago pt's vision has changed; pt cannot see at a distance; no blurred vision and pt is going to call eye dr on 04/10/19 to schedule appt about eye issue.  [facility] [place]. Pt request cb after Dr Einar Pheasant reviews note.the only covid symptom pt expressed was SOB and that has been with exertion and pt could not say how long she has had the SOB. Pt has not been exposed to + covid.

## 2019-04-10 ENCOUNTER — Telehealth: Payer: Self-pay

## 2019-04-10 DIAGNOSIS — I482 Chronic atrial fibrillation, unspecified: Secondary | ICD-10-CM | POA: Diagnosis not present

## 2019-04-10 DIAGNOSIS — Z48812 Encounter for surgical aftercare following surgery on the circulatory system: Secondary | ICD-10-CM | POA: Diagnosis not present

## 2019-04-10 DIAGNOSIS — I509 Heart failure, unspecified: Secondary | ICD-10-CM | POA: Diagnosis not present

## 2019-04-10 DIAGNOSIS — E039 Hypothyroidism, unspecified: Secondary | ICD-10-CM | POA: Diagnosis not present

## 2019-04-10 DIAGNOSIS — R7303 Prediabetes: Secondary | ICD-10-CM | POA: Diagnosis not present

## 2019-04-10 DIAGNOSIS — Z791 Long term (current) use of non-steroidal anti-inflammatories (NSAID): Secondary | ICD-10-CM | POA: Diagnosis not present

## 2019-04-10 NOTE — Telephone Encounter (Signed)
Pt stated she has someone coming today to talk to her about her food. She scheduled an appointment for tomorrow, 1/20.

## 2019-04-10 NOTE — Telephone Encounter (Signed)
Noted! Thank you

## 2019-04-10 NOTE — Telephone Encounter (Signed)
[name] nurse with [facility] left v/m;[name] seeing pt for nurse visits. [name] request verbal order for [facility] PT eval. [name] said pt has appt on 04/11/19 with Dr Einar Pheasant.

## 2019-04-11 ENCOUNTER — Encounter: Payer: Self-pay | Admitting: Neurology

## 2019-04-11 ENCOUNTER — Other Ambulatory Visit: Payer: Self-pay

## 2019-04-11 ENCOUNTER — Ambulatory Visit (INDEPENDENT_AMBULATORY_CARE_PROVIDER_SITE_OTHER): Payer: Medicare Other | Admitting: Family Medicine

## 2019-04-11 VITALS — BP 132/62 | HR 79 | Temp 97.8°F | Resp 16 | Ht 61.0 in | Wt 146.5 lb

## 2019-04-11 DIAGNOSIS — I482 Chronic atrial fibrillation, unspecified: Secondary | ICD-10-CM

## 2019-04-11 DIAGNOSIS — Z7901 Long term (current) use of anticoagulants: Secondary | ICD-10-CM | POA: Diagnosis not present

## 2019-04-11 DIAGNOSIS — R413 Other amnesia: Secondary | ICD-10-CM | POA: Diagnosis not present

## 2019-04-11 DIAGNOSIS — R2242 Localized swelling, mass and lump, left lower limb: Secondary | ICD-10-CM

## 2019-04-11 DIAGNOSIS — I872 Venous insufficiency (chronic) (peripheral): Secondary | ICD-10-CM

## 2019-04-11 NOTE — Telephone Encounter (Signed)
Agree with HH PT

## 2019-04-11 NOTE — Patient Instructions (Addendum)
#   Pain - Stop Celebrex - take tylenol [id number] mg up to 3 times per day  #Leg swelling - continue to elevate legs - wear compression socks  #memory concerns - neurology referral

## 2019-04-11 NOTE — Assessment & Plan Note (Signed)
Pt already on blood thinner and no other sign of blood clot. Suspect unilateral swelling due to hx of knee surgery on that side. Cont compression socks and elevation. Stop celecoxib and use tylenol prn.

## 2019-04-11 NOTE — Progress Notes (Signed)
Subjective:     [name] is a 86 y.o. female presenting for Leg Swelling (left leg. x a few months)     HPI   #Left Leg swelling  - present for a few months - the nurse came and was concerned about the celebrex - symptoms x 1 month - no calf pain - no chest pain, no breathing difficulty - no hx of blood clot - pt on eliquis daily - memory is not great to has a hard time remembering - no swelling when she gets up in the morning - left side has had 2 knee replace   #memory difficulty - seems to be getting worse - had several falls - over the last year, things seem to be getting worse - daughter has control of finances but also does not want patient to leave the house - pt has 24 hour care givers and enjoys being active.  - daughter lives out of state.  - HH RN reported to clinic that patient does well with medication management - notices that she occasional has a hard time finding the words   Review of Systems   Social History   Tobacco Use  Smoking Status Former Smoker  . Packs/day: 0.25  . Years: 10.00  . Pack years: 2.50  . Types: Cigarettes  . Quit date: 47  . Years since quitting: 41.0  Smokeless Tobacco Never Used        Objective:    BP Readings from Last 3 Encounters:  04/11/19 132/62  03/14/19 (!) 124/58  03/06/19 (!) 138/52   Wt Readings from Last 3 Encounters:  04/11/19 146 lb 8 oz (66.5 kg)  03/14/19 145 lb 8 oz (66 kg)  03/06/19 143 lb 8.3 oz (65.1 kg)    BP 132/62   Pulse 79   Temp 97.8 F (36.6 C)   Resp 16   Ht 5\' 1"  (1.549 m)   Wt 146 lb 8 oz (66.5 kg)   SpO2 97%   BMI 27.68 kg/m    Physical Exam Constitutional:      General: She is not in acute distress.    Appearance: She is well-developed. She is not diaphoretic.  HENT:     Right Ear: External ear normal.     Left Ear: External ear normal.     Nose: Nose normal.  Eyes:     Conjunctiva/sclera: Conjunctivae normal.  Cardiovascular:     Rate and  Rhythm: Normal rate and regular rhythm.     Heart sounds: Murmur present.  Pulmonary:     Effort: Pulmonary effort is normal. No respiratory distress.     Breath sounds: Normal breath sounds. No wheezing.  Musculoskeletal:     Cervical back: Neck supple.     Comments: B/l LE with diffuse varicose veins. Left LE with 1+ edema to mid calf  Skin:    General: Skin is warm and dry.     Capillary Refill: Capillary refill takes less than 2 seconds.  Neurological:     Mental Status: She is alert. Mental status is at baseline.  Psychiatric:        Mood and Affect: Mood normal.        Behavior: Behavior normal.    Mini-Cog - 04/11/19 1145    Normal clock drawing test?  yes    How many words correct?  2            Assessment & Plan:   Problem List Items Addressed This  Visit      Cardiovascular and Mediastinum   Chronic a-fib (HCC) (Chronic)   Chronic venous insufficiency (Chronic)     Other   Long term current use of anticoagulant therapy (Chronic)   Localized swelling of left lower leg - Primary    Pt already on blood thinner and no other sign of blood clot. Suspect unilateral swelling due to hx of knee surgery on that side. Cont compression socks and elevation. Stop celecoxib and use tylenol prn.       Memory loss    2/3 recall on minicog. Pt does not seem to have overt symptoms and does have care givers to help her at home. Family dynamic seems challenging - daughter out of state who has one opinion of patient's capability vs care takers who feel she is fairly active and does a good job managing her medications. Discussed neurology referral for further evaluation of noted memory changes, but also encouraged her contact social services if she feels she should have more control - that if work-up shows good mental ability she could regain control of her own finances.       Relevant Orders   Ambulatory referral to Neurology       Return if symptoms worsen or fail to  improve.  [name], MD

## 2019-04-11 NOTE — Assessment & Plan Note (Signed)
2/3 recall on minicog. Pt does not seem to have overt symptoms and does have care givers to help her at home. Family dynamic seems challenging - daughter out of state who has one opinion of patient's capability vs care takers who feel she is fairly active and does a good job managing her medications. Discussed neurology referral for further evaluation of noted memory changes, but also encouraged her contact social services if she feels she should have more control - that if work-up shows good mental ability she could regain control of her own finances.

## 2019-04-11 NOTE — Telephone Encounter (Signed)
[name] advised. 

## 2019-04-11 NOTE — Telephone Encounter (Signed)
Left message for [name] to call back to give ok for verbal orders/request. Did not leave a detailed message on voicemail because it did not give my information if this was a secure voicemail

## 2019-04-16 DIAGNOSIS — Z791 Long term (current) use of non-steroidal anti-inflammatories (NSAID): Secondary | ICD-10-CM | POA: Diagnosis not present

## 2019-04-16 DIAGNOSIS — R7303 Prediabetes: Secondary | ICD-10-CM | POA: Diagnosis not present

## 2019-04-16 DIAGNOSIS — E039 Hypothyroidism, unspecified: Secondary | ICD-10-CM | POA: Diagnosis not present

## 2019-04-16 DIAGNOSIS — I482 Chronic atrial fibrillation, unspecified: Secondary | ICD-10-CM | POA: Diagnosis not present

## 2019-04-16 DIAGNOSIS — I509 Heart failure, unspecified: Secondary | ICD-10-CM | POA: Diagnosis not present

## 2019-04-16 DIAGNOSIS — Z48812 Encounter for surgical aftercare following surgery on the circulatory system: Secondary | ICD-10-CM | POA: Diagnosis not present

## 2019-04-17 DIAGNOSIS — Z48812 Encounter for surgical aftercare following surgery on the circulatory system: Secondary | ICD-10-CM | POA: Diagnosis not present

## 2019-04-17 DIAGNOSIS — E039 Hypothyroidism, unspecified: Secondary | ICD-10-CM | POA: Diagnosis not present

## 2019-04-17 DIAGNOSIS — R7303 Prediabetes: Secondary | ICD-10-CM | POA: Diagnosis not present

## 2019-04-17 DIAGNOSIS — Z791 Long term (current) use of non-steroidal anti-inflammatories (NSAID): Secondary | ICD-10-CM | POA: Diagnosis not present

## 2019-04-17 DIAGNOSIS — I482 Chronic atrial fibrillation, unspecified: Secondary | ICD-10-CM | POA: Diagnosis not present

## 2019-04-17 DIAGNOSIS — I509 Heart failure, unspecified: Secondary | ICD-10-CM | POA: Diagnosis not present

## 2019-04-24 DIAGNOSIS — Z48812 Encounter for surgical aftercare following surgery on the circulatory system: Secondary | ICD-10-CM | POA: Diagnosis not present

## 2019-04-24 DIAGNOSIS — I509 Heart failure, unspecified: Secondary | ICD-10-CM | POA: Diagnosis not present

## 2019-04-24 DIAGNOSIS — E039 Hypothyroidism, unspecified: Secondary | ICD-10-CM | POA: Diagnosis not present

## 2019-04-24 DIAGNOSIS — I482 Chronic atrial fibrillation, unspecified: Secondary | ICD-10-CM | POA: Diagnosis not present

## 2019-04-24 DIAGNOSIS — Z791 Long term (current) use of non-steroidal anti-inflammatories (NSAID): Secondary | ICD-10-CM | POA: Diagnosis not present

## 2019-04-24 DIAGNOSIS — R7303 Prediabetes: Secondary | ICD-10-CM | POA: Diagnosis not present

## 2019-04-26 ENCOUNTER — Other Ambulatory Visit: Payer: Self-pay

## 2019-04-26 MED ORDER — ATORVASTATIN CALCIUM 20 MG PO TABS: 20.0000 mg | ORAL_TABLET | Freq: Every evening | ORAL | 1 refills | Status: DC

## 2019-04-27 ENCOUNTER — Other Ambulatory Visit: Payer: Self-pay

## 2019-04-27 ENCOUNTER — Other Ambulatory Visit (INDEPENDENT_AMBULATORY_CARE_PROVIDER_SITE_OTHER): Payer: Medicare Other

## 2019-04-27 DIAGNOSIS — E039 Hypothyroidism, unspecified: Secondary | ICD-10-CM

## 2019-04-27 LAB — TSH: TSH: 0.03 u[IU]/mL — ABNORMAL LOW (ref 0.35–4.50)

## 2019-04-30 ENCOUNTER — Other Ambulatory Visit: Payer: Self-pay | Admitting: Family Medicine

## 2019-04-30 ENCOUNTER — Encounter: Payer: Self-pay | Admitting: Family Medicine

## 2019-04-30 DIAGNOSIS — H35373 Puckering of macula, bilateral: Secondary | ICD-10-CM | POA: Diagnosis not present

## 2019-04-30 DIAGNOSIS — H34831 Tributary (branch) retinal vein occlusion, right eye, with macular edema: Secondary | ICD-10-CM | POA: Diagnosis not present

## 2019-04-30 DIAGNOSIS — H35363 Drusen (degenerative) of macula, bilateral: Secondary | ICD-10-CM | POA: Diagnosis not present

## 2019-04-30 DIAGNOSIS — Z961 Presence of intraocular lens: Secondary | ICD-10-CM | POA: Diagnosis not present

## 2019-04-30 DIAGNOSIS — E039 Hypothyroidism, unspecified: Secondary | ICD-10-CM

## 2019-04-30 MED ORDER — LEVOTHYROXINE SODIUM 112 MCG PO TABS: 112.0000 ug | ORAL_TABLET | Freq: Every day | ORAL | 0 refills | Status: DC

## 2019-04-30 NOTE — Progress Notes (Signed)
TSH was low Decrease thyroid medication  MyChart to patient

## 2019-05-13 ENCOUNTER — Ambulatory Visit: Payer: Medicare Other | Attending: Internal Medicine

## 2019-05-13 DIAGNOSIS — Z23 Encounter for immunization: Secondary | ICD-10-CM

## 2019-05-13 NOTE — Progress Notes (Signed)
   Covid-19 Vaccination Clinic  Name:  [name]    MRN: [id number] DOB: 1930-05-13  05/13/2019  [name] was observed post Covid-19 immunization for 15 minutes without incidence. She was provided with Vaccine Information Sheet and instruction to access the V-Safe system.   [name] was instructed to call 911 with any severe reactions post vaccine: Marland Kitchen Difficulty breathing  . Swelling of your face and throat  . A fast heartbeat  . A bad rash all over your body  . Dizziness and weakness    Immunizations Administered    Name Date Dose VIS Date Route   Pfizer COVID-19 Vaccine 05/13/2019 11:38 AM 0.3 mL 03/02/2019 Intramuscular   Manufacturer: [place]   Lot: [id number]   [place]: [id number]

## 2019-05-14 ENCOUNTER — Telehealth: Payer: Self-pay

## 2019-05-14 NOTE — Telephone Encounter (Signed)
[facility] left a [facility]icemail stating that patient told him that the doctor was sending over a new dose of her Levothyroxine today. Marjory Lies stated that they have not received any new script for patient today. Marjory Lies requested a call back at [phone].

## 2019-05-14 NOTE — Telephone Encounter (Signed)
Received notification that patient did not receive mychart message from 04/30/19. Patient was advised of her results from that day and medication change. Patient verbalized understanding

## 2019-05-14 NOTE — Telephone Encounter (Signed)
Spoke with [facility] L[facility] at [facility] and advised him that when I spoke with patient earlier I advised her that this medication was sent in earlier this month and just wanted to make sure she was taking the right dose. Marjory Lies said they did dispense 112 mcg dose earlier this month for the patient but patient was under impression that a new medication would be sent in today also. Marjory Lies will call patient and let her know she is taking the right dose and no other changes are to be made at this time.

## 2019-05-25 ENCOUNTER — Telehealth: Payer: Self-pay

## 2019-05-25 NOTE — Telephone Encounter (Signed)
Noted! Thank you

## 2019-05-25 NOTE — Telephone Encounter (Signed)
Spoke to pt. She is elevating as much as she can but she has not tried ice. This is a new issue for her. Dr Einar Pheasant is out of the office until 05-30-19. I made her an appt with Dr Darnell Level on 05-28-19. I advised her that if the swelling gets worse or she has issues with breathing or legs are weeping, she needs to go to an UC or ER.  I will send this to Dr Darnell Level so he will be aware of her appt on Monday.

## 2019-05-25 NOTE — Telephone Encounter (Signed)
Pt left v/m that she has had 2 knee replacements in lt knee with 10 years between each surgery. Pt called Dr Lucio's office but Dr Reynaldo Minium does not think that would have anything to do with lt leg swelling by end of day looks like lt knee going to pop. Rt leg is not swollen. Left v/m for pt to cb for additional info.

## 2019-05-28 ENCOUNTER — Ambulatory Visit (INDEPENDENT_AMBULATORY_CARE_PROVIDER_SITE_OTHER): Payer: Medicare Other | Admitting: Family Medicine

## 2019-05-28 ENCOUNTER — Other Ambulatory Visit: Payer: Self-pay

## 2019-05-28 ENCOUNTER — Encounter: Payer: Self-pay | Admitting: Family Medicine

## 2019-05-28 VITALS — BP 130/68 | HR 86 | Temp 97.9°F | Ht 61.0 in | Wt 146.6 lb

## 2019-05-28 DIAGNOSIS — I482 Chronic atrial fibrillation, unspecified: Secondary | ICD-10-CM | POA: Diagnosis not present

## 2019-05-28 DIAGNOSIS — N289 Disorder of kidney and ureter, unspecified: Secondary | ICD-10-CM | POA: Diagnosis not present

## 2019-05-28 DIAGNOSIS — Z950 Presence of cardiac pacemaker: Secondary | ICD-10-CM | POA: Diagnosis not present

## 2019-05-28 DIAGNOSIS — R2242 Localized swelling, mass and lump, left lower limb: Secondary | ICD-10-CM | POA: Diagnosis not present

## 2019-05-28 LAB — RENAL FUNCTION PANEL
Albumin: 4.3 g/dL (ref 3.5–5.2)
BUN: 14 mg/dL (ref 6–23)
CO2: 31 mEq/L (ref 19–32)
Calcium: 9.6 mg/dL (ref 8.4–10.5)
Chloride: 102 mEq/L (ref 96–112)
Creatinine, Ser: 0.99 mg/dL (ref 0.40–1.20)
GFR: 53.11 mL/min — ABNORMAL LOW (ref >60.00)
Glucose, Bld: 105 mg/dL — ABNORMAL HIGH (ref 70–99)
Phosphorus: 4.5 mg/dL (ref 2.3–4.6)
Potassium: 4.6 mEq/L (ref 3.5–5.1)
Sodium: 137 mEq/L (ref 135–145)

## 2019-05-28 LAB — CBC WITH DIFFERENTIAL/PLATELET
Basophils Absolute: 0.1 10*3/uL (ref 0.0–0.1)
Basophils Relative: 1.2 % (ref 0.0–3.0)
Eosinophils Absolute: 0.2 10*3/uL (ref 0.0–0.7)
Eosinophils Relative: 2.8 % (ref 0.0–5.0)
HCT: 37.7 % (ref 36.0–46.0)
Hemoglobin: 12.4 g/dL (ref 12.0–15.0)
Lymphocytes Relative: 15 % (ref 12.0–46.0)
Lymphs Abs: 0.9 10*3/uL (ref 0.7–4.0)
MCHC: 32.9 g/dL (ref 30.0–36.0)
MCV: 87.6 fl (ref 78.0–100.0)
Monocytes Absolute: 0.9 10*3/uL (ref 0.1–1.0)
Monocytes Relative: 15.1 % — ABNORMAL HIGH (ref 3.0–12.0)
Neutro Abs: 4.1 10*3/uL (ref 1.4–7.7)
Neutrophils Relative %: 65.9 % (ref 43.0–77.0)
Platelets: 202 10*3/uL (ref 150.0–400.0)
RBC: 4.3 Mil/uL (ref 3.87–5.11)
RDW: 14.9 % (ref 11.5–15.5)
WBC: 6.2 10*3/uL (ref 4.0–10.5)

## 2019-05-28 LAB — SEDIMENTATION RATE: Sed Rate: 3 mm/hr (ref 0–30)

## 2019-05-28 NOTE — Assessment & Plan Note (Signed)
Recent cardiac pacemaker replacement to left side of chest

## 2019-05-28 NOTE — Patient Instructions (Addendum)
Nice to meet you today I suspect venous insufficiency contributiong to leg swelling (blood flow back to the heart).  Consider thigh high compression stockings, leg elevation, avoid salt in diet, drink good water. We could also consider changing or lowering dose of amlodipine.  Labs today. We will be in touch with results.   Chronic Venous Insufficiency Chronic venous insufficiency is a condition where the leg veins cannot effectively pump blood from the legs to the heart. This happens when the vein walls are either stretched, weakened, or damaged, or when the valves inside the vein are damaged. With the right treatment, you should be able to continue with an active life. This condition is also called venous stasis. What are the causes? Common causes of this condition include:  High blood pressure inside the veins (venous hypertension).  Sitting or standing too long, causing increased blood pressure in the leg veins.  A blood clot that blocks blood flow in a vein (deep vein thrombosis, DVT).  Inflammation of a vein (phlebitis) that causes a blood clot to form.  Tumors in the pelvis that cause blood to back up. What increases the risk? The following factors may make you more likely to develop this condition:  Having a family history of this condition.  Obesity.  Pregnancy.  Living without enough regular physical activity or exercise (sedentary lifestyle).  Smoking.  Having a job that requires long periods of standing or sitting in one place.  Being a certain age. Women in their 79s and 7s and men in their 46s are more likely to develop this condition. What are the signs or symptoms? Symptoms of this condition include:  Veins that are enlarged, bulging, or twisted (varicose veins).  Skin breakdown or ulcers.  Reddened skin or dark discoloration of skin on the leg between the knee and ankle.  Brown, smooth, tight, and painful skin just above the ankle, usually on the inside  of the leg (lipodermatosclerosis).  Swelling of the legs. How is this diagnosed? This condition may be diagnosed based on:  Your medical history.  A physical exam.  Tests, such as: ? A procedure that creates an image of a blood vessel and nearby organs and provides information about blood flow through the blood vessel (duplex ultrasound). ? A procedure that tests blood flow (plethysmography). ? A procedure that looks at the veins using X-ray and dye (venogram). How is this treated? The goals of treatment are to help you return to an active life and to minimize pain or disability. Treatment depends on the severity of your condition, and it may include:  Wearing compression stockings. These can help relieve symptoms and help prevent your condition from getting worse. However, they do not cure the condition.  Sclerotherapy. This procedure involves an injection of a solution that shrinks damaged veins.  Surgery. This may involve: ? Removing a diseased vein (vein stripping). ? Cutting off blood flow through the vein (laser ablation surgery). ? Repairing or reconstructing a valve within the affected vein. Follow these instructions at home:      Wear compression stockings as told by your health care provider. These stockings help to prevent blood clots and reduce swelling in your legs.  Take over-the-counter and prescription medicines only as told by your health care provider.  Stay active by exercising, walking, or doing different activities. Ask your health care provider what activities are safe for you and how much exercise you need.  Drink enough fluid to keep your urine pale yellow.  Do not use any products that contain nicotine or tobacco, such as cigarettes, e-cigarettes, and chewing tobacco. If you need help quitting, ask your health care provider.  Keep all follow-up visits as told by your health care provider. This is important. Contact a health care provider if  you:  Have redness, swelling, or more pain in the affected area.  See a red streak or line that goes up or down from the affected area.  Have skin breakdown or skin loss in the affected area, even if the breakdown is small.  Get an injury in the affected area. Get help right away if:  You get an injury and an open wound in the affected area.  You have: ? Severe pain that does not get better with medicine. ? Sudden numbness or weakness in the foot or ankle below the affected area. ? Trouble moving your foot or ankle. ? A fever. ? Worse or persistent symptoms. ? Chest pain. ? Shortness of breath. Summary  Chronic venous insufficiency is a condition where the leg veins cannot effectively pump blood from the legs to the heart.  Chronic venous insufficiency occurs when the vein walls become stretched, weakened, or damaged, or when valves within the vein are damaged.  Treatment depends on how severe your condition is. It often involves wearing compression stockings and may involve having a procedure.  Make sure you stay active by exercising, walking, or doing different activities. Ask your health care provider what activities are safe for you and how much exercise you need. This information is not intended to replace advice given to you by your health care provider. Make sure you discuss any questions you have with your health care provider. Document Revised: 11/29/2017 Document Reviewed: 11/29/2017 Elsevier Patient Education  [place].

## 2019-05-28 NOTE — Assessment & Plan Note (Signed)
No recent dig level - check today.

## 2019-05-28 NOTE — Assessment & Plan Note (Addendum)
She does have bilateral lower leg swelling (not specifically at ankles) but L>R, no significant erythema or warmth of legs. She has had several knee surgeries L>R which may contribute. She also was hyperthyroid on last check s/p recent levothyroxine dosing changes (still too soon to recheck). She is on amlodipine which can be associated with pedal edema - may consider decreased dose to see effect.  Will further evaluate with labwork today.  Anticipate chronic venous insufficiency related - discussed compression stockings, leg elevation, minimizing sodium in diet and maintaining good hydration status.

## 2019-05-28 NOTE — Progress Notes (Signed)
This visit was conducted in person.  BP 130/68 (BP Location: Left Arm, Patient Position: Sitting, Cuff Size: Normal)   Pulse 86   Temp 97.9 F (36.6 C) (Temporal)   Ht 5\' 1"  (1.549 m)   Wt 146 lb 9 oz (66.5 kg)   SpO2 99%   BMI 27.69 kg/m    CC: leg swelling Subjective:    Patient ID: [name], female    DOB: 09-03-1930, 86 y.o.   MRN: [id number]  HPI: [name] is a 86 y.o. female presenting on 05/28/2019 for Leg Swelling (C/o left lower leg swelling and weakness. Denies any pain.  Tried compression hose but swelling moved to upper leg.  Started about 2 mos ago.  H/o knee surgeries, which pt thinks may be the cause.  Pt accompanied by caregiver, Mickia- temp 97.8.)   Seen PCP 03/2019 for similar trouble (L leg swelling) thought related to 2 prior surgeries on that side. rec compression stockings and elevation. celebrex was also discontinued. Ortho didn't think related to surgery.   She is using stretchy socks, finds compression stockings cause worsening swelling to upper leg. Also noted bruising to L foot. Mild dyspnea.   No redness or warmth of the leg. Swelling worse at night time. Better when she wakes up.  Denies chest pain/tightness, abd pain, bowel changes, dizziness, HA.   She is on eliquis 5mg  bid for atrial fibrillation. No h/o blood clots in the past.  She is on oxybutynin XL 5mg  at bedtime.  She is on amlodipine 10mg  for hypertension.   Levothyroxine recently changed from 115mcg to 149mcg on 04/30/2019.   S/p R knee replacement, L knee replacement x2 and revision x1. Some L lateral knee discomfort present at this time.   Pacemaker replacement to left chest 02/2019.       Relevant past medical, surgical, family and social history reviewed and updated as indicated. Interim medical history since our last visit reviewed. Allergies and medications reviewed and updated. Outpatient Medications Prior to Visit  Medication Sig Dispense Refill  .  acetaminophen (TYLENOL) 500 MG tablet Take 1,000 mg by mouth every 8 (eight) hours as needed for moderate pain or headache.     Marland Kitchen amLODipine (NORVASC) 10 MG tablet Take 10 mg by mouth every evening.     Marland Kitchen apixaban (ELIQUIS) 5 MG TABS tablet Take 1 tablet (5 mg total) by mouth 2 (two) times daily. 60 tablet 6  . atorvastatin (LIPITOR) 20 MG tablet Take 1 tablet (20 mg total) by mouth every evening. 90 tablet 1  . Carboxymethylcell-Hypromellose (GENTEAL OP) Place 1 drop into both eyes daily as needed (dry eyes).    . Cyanocobalamin (B-12) 1000 MCG CAPS TAKE 1 CAPSULE BY MOUTH EVERY DAY 30 capsule 35  . digoxin (LANOXIN) 0.125 MG tablet Take 0.125 mg by mouth daily. Hold for pulse less than 60.    . fluticasone (FLONASE) 50 MCG/ACT nasal spray Place 1 spray into both nostrils daily as needed for allergies or rhinitis.    Marland Kitchen levothyroxine (SYNTHROID) 112 MCG tablet Take 1 tablet (112 mcg total) by mouth daily. 90 tablet 0  . oxybutynin (DITROPAN-XL) 5 MG 24 hr tablet Take 5 mg by mouth at bedtime.     . sertraline (ZOLOFT) 25 MG tablet Take 25 mg by mouth every evening.     . Cholecalciferol (VITAMIN D) 50 MCG (2000 UT) tablet Take 2,000 Units by mouth 2 (two) times daily.     No facility-administered medications prior  to visit.     Per HPI unless specifically indicated in ROS section below Review of Systems Objective:    BP 130/68 (BP Location: Left Arm, Patient Position: Sitting, Cuff Size: Normal)   Pulse 86   Temp 97.9 F (36.6 C) (Temporal)   Ht 5\' 1"  (1.549 m)   Wt 146 lb 9 oz (66.5 kg)   SpO2 99%   BMI 27.69 kg/m   Wt Readings from Last 3 Encounters:  05/28/19 146 lb 9 oz (66.5 kg)  04/11/19 146 lb 8 oz (66.5 kg)  03/14/19 145 lb 8 oz (66 kg)    Physical Exam Vitals and nursing note reviewed.  Constitutional:      Appearance: Normal appearance. She is not ill-appearing.  HENT:     Head: Normocephalic and atraumatic.  Eyes:     Extraocular Movements: Extraocular movements  intact.     Pupils: Pupils are equal, round, and reactive to light.  Cardiovascular:     Rate and Rhythm: Normal rate and regular rhythm.     Pulses: Normal pulses.     Heart sounds: Normal heart sounds. No murmur.  Pulmonary:     Effort: Pulmonary effort is normal. No respiratory distress.     Breath sounds: Normal breath sounds. No wheezing, rhonchi or rales.  Musculoskeletal:        General: Swelling present.     Right lower leg: Edema present.     Left lower leg: Edema present.     Comments:  2+ DP bilaterally 1+ pitting, moderate nonpitting bilaterally No palpable cords or popliteal fullness Varicose veins, reticulated veins L>R  Skin:    General: Skin is warm and dry.     Findings: No rash.  Neurological:     Mental Status: She is alert.  Psychiatric:        Mood and Affect: Mood normal.        Behavior: Behavior normal.       Results for orders placed or performed in visit on 04/27/19  TSH  Result Value Ref Range   TSH 0.03 (L) 0.35 - 4.50 uIU/mL   Lab Results  Component Value Date   CREATININE 1.17 (H) 02/13/2019   BUN 19 02/13/2019   NA 137 02/13/2019   K 4.4 02/13/2019   CL 103 02/13/2019   CO2 25 02/13/2019    Lab Results  Component Value Date   ALT 16 12/11/2018   AST 19 12/11/2018   ALKPHOS 65 12/11/2018   BILITOT 0.5 12/11/2018    Lab Results  Component Value Date   WBC 6.0 02/13/2019   HGB 12.4 02/13/2019   HCT 36.8 02/13/2019   MCV 86 02/13/2019   PLT 228 02/13/2019   Assessment & Plan:  This visit occurred during the SARS-CoV-2 public health emergency.  Safety protocols were in place, including screening questions prior to the visit, additional usage of staff PPE, and extensive cleaning of exam room while observing appropriate contact time as indicated for disinfecting solutions.   Problem List Items Addressed This Visit    Localized swelling of left lower leg - Primary    She does have bilateral lower leg swelling (not specifically at  ankles) but L>R, no significant erythema or warmth of legs. She has had several knee surgeries L>R which may contribute. She also was hyperthyroid on last check s/p recent levothyroxine dosing changes (still too soon to recheck). She is on amlodipine which can be associated with pedal edema - may consider decreased dose to  see effect.  Will further evaluate with labwork today.  Anticipate chronic venous insufficiency related - discussed compression stockings, leg elevation, minimizing sodium in diet and maintaining good hydration status.      Relevant Orders   Sedimentation rate   CBC with Differential/Platelet   D-dimer, quantitative (not at [facility])   Renal function panel   Chronic atrial fibrillation (HCC)   Relevant Orders   Digoxin level   Chronic a-fib (HCC) (Chronic)    No recent dig level - check today.       Cardiac pacemaker in situ    Recent cardiac pacemaker replacement to left side of chest       Other Visit Diagnoses    Renal insufficiency       Relevant Orders   Renal function panel       No orders of the defined types were placed in this encounter.  Orders Placed This Encounter  Procedures  . Sedimentation rate  . CBC with Differential/Platelet  . D-dimer, quantitative (not at [facility])  . Renal function panel  . Digoxin level    Patient instructions: Nice to meet you today I suspect venous insufficiency contributiong to leg swelling (blood flow back to the heart).  Consider thigh high compression stockings, leg elevation, avoid salt in diet, drink good water. We could also consider changing or lowering dose of amlodipine.  Labs today. We will be in touch with results.   Follow up plan: Return if symptoms worsen or fail to improve.  Ria Bush, MD

## 2019-05-29 ENCOUNTER — Telehealth: Payer: Self-pay | Admitting: Radiology

## 2019-05-29 LAB — DIGOXIN LEVEL: Digoxin Level: 1.1 mcg/L (ref 0.8–2.0)

## 2019-05-29 LAB — D-DIMER, QUANTITATIVE: D-Dimer, Quant: 0.58 mcg/mL FEU — ABNORMAL HIGH (ref <0.50)

## 2019-05-29 NOTE — Telephone Encounter (Signed)
[facility] called a critical result, D-Dimer, 0.59. Results given to Dr Danise Mina

## 2019-05-31 ENCOUNTER — Encounter: Payer: Medicare Other | Admitting: Internal Medicine

## 2019-05-31 NOTE — Telephone Encounter (Signed)
See result note.  

## 2019-06-06 ENCOUNTER — Ambulatory Visit: Payer: Medicare Other | Attending: Internal Medicine

## 2019-06-06 DIAGNOSIS — Z23 Encounter for immunization: Secondary | ICD-10-CM

## 2019-06-06 NOTE — Progress Notes (Signed)
   Covid-19 Vaccination Clinic  Name:  DANAJIA GOODNER    MRN: [id number] DOB: 03-Feb-1931  06/06/2019  Ms. Scheaffer was observed post Covid-19 immunization for 15 minutes without incident. She was provided with Vaccine Information Sheet and instruction to access the V-Safe system.   Ms. Mccurry was instructed to call 911 with any severe reactions post vaccine: Marland Kitchen Difficulty breathing  . Swelling of face and throat  . A fast heartbeat  . A bad rash all over body  . Dizziness and weakness   Immunizations Administered    Name Date Dose VIS Date Route   Pfizer COVID-19 Vaccine 06/06/2019 10:36 AM 0.3 mL 03/02/2019 Intramuscular   Manufacturer: [place]   Lot: [id number]   [place]: [id number]

## 2019-06-07 ENCOUNTER — Encounter: Payer: Medicare Other | Admitting: Internal Medicine

## 2019-06-12 ENCOUNTER — Ambulatory Visit (INDEPENDENT_AMBULATORY_CARE_PROVIDER_SITE_OTHER): Payer: Medicare Other | Admitting: Family Medicine

## 2019-06-12 ENCOUNTER — Encounter: Payer: Self-pay | Admitting: Family Medicine

## 2019-06-12 ENCOUNTER — Other Ambulatory Visit: Payer: Medicare Other

## 2019-06-12 ENCOUNTER — Other Ambulatory Visit: Payer: Self-pay

## 2019-06-12 VITALS — BP 142/60 | HR 85 | Temp 97.9°F | Resp 26 | Ht 61.0 in | Wt 154.5 lb

## 2019-06-12 DIAGNOSIS — Z7901 Long term (current) use of anticoagulants: Secondary | ICD-10-CM | POA: Diagnosis not present

## 2019-06-12 DIAGNOSIS — I482 Chronic atrial fibrillation, unspecified: Secondary | ICD-10-CM

## 2019-06-12 DIAGNOSIS — R1013 Epigastric pain: Secondary | ICD-10-CM | POA: Diagnosis not present

## 2019-06-12 DIAGNOSIS — R2242 Localized swelling, mass and lump, left lower limb: Secondary | ICD-10-CM

## 2019-06-12 NOTE — Assessment & Plan Note (Signed)
She notes intermittent epigastric pain which only occurs with bending/twisting motion. Denies with other activity. Benign abdominal exam. Advised watch and wait. Wonder about muscle strain. Try to stretch abdomen.

## 2019-06-12 NOTE — Assessment & Plan Note (Signed)
Persisting symptoms. She is on elliquis and notes she takes daily. D-dimer elevated a few weeks ago. No SOB or tachycardia. Given persistence will get [place] to rule out clot though overall low risk with blood thinner. Continue elevation and compression socks as tolerated.

## 2019-06-12 NOTE — Progress Notes (Signed)
Subjective:     [name] is a 86 y.o. female presenting for Follow-up (3 months follow up)    Here with daughter today  HPI  #Left Leg swelling - worse than right - persistent - knee surgery x 3 on this side and had a fixation - pretty sure she takes the elliquis every day -- will occasionally forget and take the medication later in the day - care givers will remind her - hx of varicose vein surgery bilaterally  #abdominal discomfort - every once in a while will "swell up" - if she bends over it will be painful  #weight gain - has been eating more than normal - family visiting - no BM in 3 days - endorses some SOB this morning - did have her pacemaker switched - can lay flat at night, no PND -    Review of Systems   Social History   Tobacco Use  Smoking Status Former Smoker  . Packs/day: 0.25  . Years: 10.00  . Pack years: 2.50  . Types: Cigarettes  . Quit date: 42  . Years since quitting: 41.2  Smokeless Tobacco Never Used        Objective:    BP Readings from Last 3 Encounters:  06/12/19 (!) 142/60  05/28/19 130/68  04/11/19 132/62   Wt Readings from Last 3 Encounters:  06/12/19 154 lb 8 oz (70.1 kg)  05/28/19 146 lb 9 oz (66.5 kg)  04/11/19 146 lb 8 oz (66.5 kg)    BP (!) 142/60   Pulse 85   Temp 97.9 F (36.6 C)   Resp (!) 26   Ht 5\' 1"  (1.549 m)   Wt 154 lb 8 oz (70.1 kg)   SpO2 96%   BMI 29.19 kg/m    Physical Exam Constitutional:      General: She is not in acute distress.    Appearance: She is well-developed. She is not diaphoretic.  HENT:     Right Ear: External ear normal.     Left Ear: External ear normal.     Nose: Nose normal.  Eyes:     Conjunctiva/sclera: Conjunctivae normal.  Cardiovascular:     Rate and Rhythm: Normal rate. Rhythm irregular.     Heart sounds: Murmur present.  Pulmonary:     Effort: Pulmonary effort is normal. No respiratory distress.     Breath sounds: Normal breath sounds.    Abdominal:     General: Abdomen is flat. Bowel sounds are normal. There is no distension.     Palpations: Abdomen is soft.     Tenderness: There is no abdominal tenderness. There is no guarding or rebound.  Musculoskeletal:     Cervical back: Neck supple.     Right lower leg: Edema (1+ to ankle) present.     Left lower leg: Edema (2+ to mid calf) present.  Skin:    General: Skin is warm and dry.     Capillary Refill: Capillary refill takes less than 2 seconds.  Neurological:     Mental Status: She is alert. Mental status is at baseline.  Psychiatric:        Mood and Affect: Mood normal.        Behavior: Behavior normal.           Assessment & Plan:   Problem List Items Addressed This Visit      Cardiovascular and Mediastinum   Chronic atrial fibrillation ([place]) - Primary     Other  Long term current use of anticoagulant therapy (Chronic)   Relevant Orders   US Venous Img Lower Unilateral Left   Localized swelling of left lower leg    Persisting symptoms. She is on elliquis and notes she takes daily. D-dimer elevated a few weeks ago. No SOB or tachycardia. Given persistence will get [place] to rule out clot though overall low risk with blood thinner. Continue elevation and compression socks as tolerated.       Relevant Orders   US Venous Img Lower Unilateral Left   Epigastric pain    She notes intermittent epigastric pain which only occurs with bending/twisting motion. Denies with other activity. Benign abdominal exam. Advised watch and wait. Wonder about muscle strain. Try to stretch abdomen.           Return if symptoms worsen or fail to improve.  [name], MD

## 2019-06-13 ENCOUNTER — Ambulatory Visit: Payer: Medicare Other

## 2019-06-15 ENCOUNTER — Ambulatory Visit
Admission: RE | Admit: 2019-06-15 | Discharge: 2019-06-15 | Disposition: A | Discharge status: Home / Self Care | Payer: Medicare Other | Source: Ambulatory Visit | Attending: Family Medicine | Admitting: Family Medicine

## 2019-06-15 ENCOUNTER — Other Ambulatory Visit: Payer: Self-pay

## 2019-06-15 DIAGNOSIS — Z7901 Long term (current) use of anticoagulants: Secondary | ICD-10-CM | POA: Diagnosis not present

## 2019-06-15 DIAGNOSIS — R600 Localized edema: Secondary | ICD-10-CM | POA: Diagnosis not present

## 2019-06-15 DIAGNOSIS — R2242 Localized swelling, mass and lump, left lower limb: Secondary | ICD-10-CM | POA: Insufficient documentation

## 2019-06-19 ENCOUNTER — Encounter: Payer: Self-pay | Admitting: Neurology

## 2019-06-19 ENCOUNTER — Other Ambulatory Visit: Payer: Self-pay

## 2019-06-19 ENCOUNTER — Other Ambulatory Visit: Payer: Medicare Other

## 2019-06-19 ENCOUNTER — Ambulatory Visit (INDEPENDENT_AMBULATORY_CARE_PROVIDER_SITE_OTHER): Payer: Medicare Other | Admitting: Neurology

## 2019-06-19 VITALS — BP 135/72 | HR 70 | Ht 63.0 in | Wt 153.2 lb

## 2019-06-19 DIAGNOSIS — G459 Transient cerebral ischemic attack, unspecified: Secondary | ICD-10-CM | POA: Diagnosis not present

## 2019-06-19 DIAGNOSIS — R413 Other amnesia: Secondary | ICD-10-CM | POA: Diagnosis not present

## 2019-06-19 DIAGNOSIS — R269 Unspecified abnormalities of gait and mobility: Secondary | ICD-10-CM | POA: Diagnosis not present

## 2019-06-19 LAB — T4, FREE: Free T4: 1.5 ng/dL (ref 0.8–1.8)

## 2019-06-19 LAB — TSH: TSH: 0.15 mIU/L — ABNORMAL LOW (ref 0.40–4.50)

## 2019-06-19 LAB — VITAMIN B12: Vitamin B-12: 1989 pg/mL — ABNORMAL HIGH (ref 200–1100)

## 2019-06-19 NOTE — Patient Instructions (Signed)
1. Bloodwork for TSH, free T4, B12  2. Schedule head CT without contrast  3. Schedule Neurocognitive testing  4. Follow-up after tests, call for any changes  FALL PRECAUTIONS: Be cautious when walking. Scan the area for obstacles that may increase the risk of trips and falls. When getting up in the mornings, sit up at the edge of the bed for a few minutes before getting out of bed. Consider elevating the bed at the head end to avoid drop of blood pressure when getting up. Walk always in a well-lit room (use night lights in the walls). Avoid area rugs or power cords from appliances in the middle of the walkways. Use a walker or a cane if necessary and consider physical therapy for balance exercise. Get your eyesight checked regularly.  FINANCIAL OVERSIGHT: Supervision, especially oversight when making financial decisions or transactions is also recommended.  HOME SAFETY: Consider the safety of the kitchen when operating appliances like stoves, microwave oven, and blender. Consider having supervision and share cooking responsibilities until no longer able to participate in those. Accidents with firearms and other hazards in the house should be identified and addressed as well.  DRIVING: Regarding driving, in patients with progressive memory problems, driving will be impaired. We advise to have someone else do the driving if trouble finding directions or if minor accidents are reported. Independent driving assessment is available to determine safety of driving.  ABILITY TO BE LEFT ALONE: If patient is unable to contact 911 operator, consider using LifeLine, or when the need is there, arrange for someone to stay with patients. Smoking is a fire hazard, consider supervision or cessation. Risk of wandering should be assessed by caregiver and if detected at any point, supervision and safe proof recommendations should be instituted.  MEDICATION SUPERVISION: Inability to self-administer medication needs to  be constantly addressed. Implement a mechanism to ensure safe administration of the medications.  RECOMMENDATIONS FOR ALL PATIENTS WITH MEMORY PROBLEMS: 1. Continue to exercise (Recommend 30 minutes of walking everyday, or 3 hours every week) 2. Increase social interactions - continue going to [place] and enjoy social gatherings with friends and family 3. Eat healthy, avoid fried foods and eat more fruits and vegetables 4. Maintain adequate blood pressure, blood sugar, and blood cholesterol level. Reducing the risk of stroke and cardiovascular disease also helps promoting better memory. 5. Avoid stressful situations. Live a simple life and avoid aggravations. Organize your time and prepare for the next day in anticipation. 6. Sleep well, avoid any interruptions of sleep and avoid any distractions in the bedroom that may interfere with adequate sleep quality 7. Avoid sugar, avoid sweets as there is a strong link between excessive sugar intake, diabetes, and cognitive impairment The Mediterranean diet has been shown to help patients reduce the risk of progressive memory disorders and reduces cardiovascular risk. This includes eating fish, eat fruits and green leafy vegetables, nuts like almonds and hazelnuts, walnuts, and also use olive oil. Avoid fast foods and fried foods as much as possible. Avoid sweets and sugar as sugar use has been linked to worsening of memory function.  There is always a concern of gradual progression of memory problems. If this is the case, then we may need to adjust level of care according to patient needs. Support, both to the patient and caregiver, should then be put into place.

## 2019-06-19 NOTE — Progress Notes (Signed)
NEUROLOGY CONSULTATION NOTE  [name] MRN: [id number] DOB: December 12, 1930  Referring provider: Dr. Waunita Schooner Primary care provider: Dr. Waunita Schooner  Reason for consult:  Memory loss  Dear Dr Einar Pheasant:  Thank you for your kind referral of [name] for consultation of the above symptoms. Although her history is well known to you, please allow me to reiterate it for the purpose of our medical record. The patient was accompanied to the clinic by her aide [facility] who also provides collateral information. Records and images were personally reviewed where available.   HISTORY OF PRESENT ILLNESS: This is an 86 year old right-handed woman with a history of hypertension, hyperlipidemia, hypothyroidism, atrial fibrillation s/p PPM on anticoagulation with Eliquis, presenting for evaluation of memory loss. She is accompanied by her aide [name] who has only been with her for 2 months. I attempted to contact her daughter for more information, unable to reach her. She states she knows her memory is not good. She gets ready to call a friend and cannot think of their name. She had been living alone until Christmas 2019 after she had several falls. She stayed at [facility] then moved to a different home with almost 24/7 care ([facility]). She is alone from 4 to 8pm. Her daughter/POA lives in [place]. She has a son in [place]. Her daughter manages her checkbook, which aggravates her because she does not not what is going on. She reports she was good with bill payments. She stopped driving 2 years ago, stating her daughter just decided she did not want the patient to drive. She manages her own medications, she fixes 4 boxes every week, aides watch her and report she does well with this. She denies misplacing things and does her own cooking at night. She denies leaving the stove on. [name] reports that she is mostly just forgetful, good with her medications. She is independent with dressing  and bathing. No paranoia or hallucinations. She reports a fall when she pulled on a limb, then says "my daughter said it's not true."   PCP notes from 04/11/19 reviewed: "Pt does not seem to have overt symptoms and does have care givers to help her at home. Family dynamic seems challenging - daughter out of state who has one opinion of patient's capability vs care takers who feel she is fairly active and does a good job managing her medications. Discussed neurology referral for further evaluation of noted memory changes, but also encouraged her contact social services if she feels she should have more control - that if work-up shows good mental ability she could regain control of her own finances."  She denies any headaches, neck/back pain, focal tingling/weakness, bladder dysfunction, anosmia, tremors. She feels her eyes are blurred when her eyelids are heavy, sometimes making her feel dizzy. She denies any falls in the past 4 months. Sleep is okay. She has swelling in both legs. Her left leg has felt weaker for the past 2-3 months. She has tingling in her fingers.   PAST MEDICAL HISTORY: Past Medical History:  Diagnosis Date  . Arrhythmia    chronic atrial fib  . Atrial fibrillation, chronic ([place])   . Chronic anticoagulation   . Chronic atrial fibrillation ([place]) 01/28/2017  . Chronic venous insufficiency 01/28/2017  . Clavicle fracture 11/20/2017  . Complication of anesthesia    " difficult waking "  . Long term current use of anticoagulant therapy 01/28/2017  . Osteoarthritis   . Presence of permanent cardiac  pacemaker 01/28/2017   Original implant 1992 Lead Intermedics 431-04 Generator change 2001 and 2011.  Medtronic generator.   . SSS (sick sinus syndrome) ([place])   . Thyroid disease    hypothyroidism  . TIA (transient ischemic attack)     PAST SURGICAL HISTORY: Past Surgical History:  Procedure Laterality Date  . ABDOMINAL HYSTERECTOMY    . INSERT / REPLACE / REMOVE PACEMAKER      generator change 2011 medtronic sigma  . KNEE SURGERY    . LEAD INSERTION N/A 03/05/2019   Procedure: LEAD INSERTION - RV LEAD;  Surgeon: Deboraha Sprang, MD;  Location: [place] [facility];  Service: Cardiovascular;  Laterality: N/A;  . PACEMAKER IMPLANT N/A 03/05/2019   Procedure: PACEMAKER IMPLANT;  Surgeon: Deboraha Sprang, MD;  Location: [place] [facility];  Service: Cardiovascular;  Laterality: N/A;  . PACEMAKER INSERTION    . PARS PLANA VITRECTOMY Right 07/19/2018   Procedure: VITRECTOMY WITH VITREOUS BIPOSY & ENDOLASER;  Surgeon: Jalene Mullet, MD;  Location: [place];  Service: Ophthalmology;  Laterality: Right;  . PPM GENERATOR CHANGEOUT N/A 02/02/2017   Procedure: PPM GENERATOR CHANGEOUT;  Surgeon: Deboraha Sprang, MD;  Location: [place] [facility];  Service: Cardiovascular;  Laterality: N/A;  . REPLACEMENT TOTAL KNEE BILATERAL    . VARICOSE VEIN SURGERY      MEDICATIONS: Current Outpatient Medications on File Prior to Visit  Medication Sig Dispense Refill  . acetaminophen (TYLENOL) 500 MG tablet Take 1,000 mg by mouth every 8 (eight) hours as needed for moderate pain or headache.     Marland Kitchen amLODipine (NORVASC) 10 MG tablet Take 10 mg by mouth every evening.     Marland Kitchen apixaban (ELIQUIS) 5 MG TABS tablet Take 1 tablet (5 mg total) by mouth 2 (two) times daily. 60 tablet 6  . atorvastatin (LIPITOR) 20 MG tablet Take 1 tablet (20 mg total) by mouth every evening. 90 tablet 1  . Carboxymethylcell-Hypromellose (GENTEAL OP) Place 1 drop into both eyes daily as needed (dry eyes).    . Cholecalciferol (VITAMIN D) 50 MCG (2000 UT) tablet Take 2,000 Units by mouth 2 (two) times daily.    . Cyanocobalamin (B-12) 1000 MCG CAPS TAKE 1 CAPSULE BY MOUTH EVERY DAY 30 capsule 35  . digoxin (LANOXIN) 0.125 MG tablet Take 0.125 mg by mouth daily. Hold for pulse less than 60.    . fluticasone (FLONASE) 50 MCG/ACT nasal spray Place 1 spray into both nostrils daily as needed for allergies or rhinitis.    Marland Kitchen  levothyroxine (SYNTHROID) 112 MCG tablet Take 1 tablet (112 mcg total) by mouth daily. 90 tablet 0  . oxybutynin (DITROPAN-XL) 5 MG 24 hr tablet Take 5 mg by mouth at bedtime.     . sertraline (ZOLOFT) 25 MG tablet Take 25 mg by mouth every evening.      No current facility-administered medications on file prior to visit.    ALLERGIES: Allergies  Allergen Reactions  . Adhesive [Tape] Other (See Comments)    TAPE WILL TEAR THE SKIN!!!!  . Penicillins Hives and Rash    Did it involve swelling of the face/tongue/throat, SOB, or low BP? No Did it involve sudden or severe rash/hives, skin peeling, or any reaction on the inside of your mouth or nose? Yes Did you need to seek medical attention at a hospital or doctor's office? Unknown  When did it last happen?unknown  If all above answers are "NO", may proceed with cephalosporin use.  FAMILY HISTORY: Family History  Problem Relation Age of Onset  . Diabetes Mother   . Mental illness Mother   . Diabetes Father   . Heart attack Father     SOCIAL HISTORY: Social History   Socioeconomic History  . Marital status: Widowed    Spouse name: Not on file  . Number of children: 2  . Years of education: 2- year college  . Highest education level: Not on file  Occupational History  . Not on file  Tobacco Use  . Smoking status: Former Smoker    Packs/day: 0.25    Years: 10.00    Pack years: 2.50    Types: Cigarettes    Quit date: 1980    Years since quitting: 41.2  . Smokeless tobacco: Never Used  Substance and Sexual Activity  . Alcohol use: No  . Drug use: No  . Sexual activity: Never  Other Topics Concern  . Not on file  Social History Narrative   03/14/19   From: the area   Living: alone, but caretaker that - 20 hours a day of care givers, is alone from 4-8 pm   Work: retired -    Widowed: husband died from [place], used to run a golf course      Family: 2 children - [name] ([place]),  Marcello Moores (lives nearby), has grandchildren and Designer, industrial/product      Enjoys: play golf (not as much), painting, Barrister's clerk       Exercise: yard work   Diet: anything, whatever she can find      Land belts: Yes    Guns: No   Safe in relationships: Yes    Social Determinants of Radio broadcast assistant Strain:   . Difficulty of Paying Living Expenses:   Food Insecurity:   . Worried About Charity fundraiser in the Last Year:   . Arboriculturist in the Last Year:   Transportation Needs:   . Film/video editor (Medical):   Marland Kitchen Lack of Transportation (Non-Medical):   Physical Activity:   . Days of Exercise per Week:   . Minutes of Exercise per Session:   Stress:   . Feeling of Stress :   Social Connections:   . Frequency of Communication with Friends and Family:   . Frequency of Social Gatherings with Friends and Family:   . Attends Religious Services:   . Active Member of Clubs or Organizations:   . Attends Archivist Meetings:   Marland Kitchen Marital Status:   Intimate Partner Violence:   . Fear of Current or Ex-Partner:   . Emotionally Abused:   Marland Kitchen Physically Abused:   . Sexually Abused:     REVIEW OF SYSTEMS: Constitutional: No fevers, chills, or sweats, no generalized fatigue, change in appetite Eyes: No visual changes, double vision, eye pain Ear, nose and throat: No hearing loss, ear pain, nasal congestion, sore throat Cardiovascular: No chest pain, palpitations Respiratory:  No shortness of breath at rest or with exertion, wheezes GastrointestinaI: No nausea, vomiting, diarrhea, abdominal pain, fecal incontinence Genitourinary:  No dysuria, urinary retention or frequency Musculoskeletal:  No neck pain, back pain Integumentary: No rash, pruritus, skin lesions Neurological: as above Psychiatric: No depression, insomnia, anxiety Endocrine: No palpitations, fatigue, diaphoresis, mood swings, change in appetite, change in weight, increased  thirst Hematologic/Lymphatic:  No anemia, purpura, petechiae. Allergic/Immunologic: no itchy/runny eyes, nasal congestion, recent allergic reactions, rashes  PHYSICAL EXAM: Vitals:   06/19/19 1044  BP: 135/72  Pulse: 70  SpO2: 95%   General: No acute distress Head:  Normocephalic/atraumatic Skin/Extremities: No rash, no edema Neurological Exam: Mental status: alert and oriented to person, place, and time, no dysarthria or aphasia, Fund of knowledge is appropriate.  Recent and remote memory are impaired. Attention and concentration are reduced. SLUMS 17/30. [place] Mental Exam 06/19/2019  Weekday Correct 1  Current year 1  What state are we in? 1  Amount spent 1  Amount left 0  # of Animals 1  5 objects recall 1  Number series 1  Hour markers 2  Time correct 2  Placed X in triangle correctly 1  Largest Figure 1  Name of female 2  Date back to work 0  Type of work 2  State she lived in 0  Total score 17    Cranial nerves: CN I: not tested CN II: pupils equal, round and reactive to light, visual fields intact CN III, IV, VI:  full range of motion, no nystagmus, no ptosis CN V: facial sensation intact CN VII: upper and lower face symmetric CN VIII: hearing intact to conversation CN IX, X: gag intact, uvula midline Bulk & Tone: normal, no fasciculations. Motor: 5/5 throughout with no pronator drift. Sensation: intact to light touch, cold, pin, vibration and joint position sense.  No extinction to double simultaneous stimulation.  Romberg test negative Deep Tendon Reflexes: +2 throughout, no ankle clonus Cerebellar: no incoordination on finger to nose testing Gait: narrow-based and steady, able to tandem walk adequately. Tremor: none  IMPRESSION: This is an 86 year old right-handed woman with a history of hypertension, hyperlipidemia, hypothyroidism, atrial fibrillation s/p PPM on anticoagulation with Eliquis, presenting for evaluation of memory loss. Her  neurological exam is non-focal, SLUMS score today 17/30. I tried to contact her daughter/POA to gather further information about her cognitive concerns, no answer. She has close to 24/7 supervision with aides reporting she does well independently. Her PCP has noted that family dynamic seems challenging, Neurocognitive testing will be helpful to further evaluate cognitive concerns to help guide long-term management. We discussed workup for memory loss, check TSH, free T4, B12. Head CT without contrast will be ordered to assess for underlying structural abnormality and assess vascular load (unable to do MRI due to PPM). Follow-up after tests, she knows to call for any changes.   Thank you for allowing me to participate in the care of this patient. Please do not hesitate to call for any questions or concerns.   Ellouise Newer, M.D.  CC: Dr. Einar Pheasant

## 2019-06-21 ENCOUNTER — Encounter: Payer: Medicare Other | Admitting: Internal Medicine

## 2019-06-21 ENCOUNTER — Telehealth: Payer: Self-pay | Admitting: Family Medicine

## 2019-06-21 DIAGNOSIS — I10 Essential (primary) hypertension: Secondary | ICD-10-CM

## 2019-06-21 MED ORDER — LISINOPRIL 10 MG PO TABS: 10.0000 mg | ORAL_TABLET | Freq: Every day | ORAL | 3 refills | Status: DC

## 2019-06-21 NOTE — Telephone Encounter (Signed)
Please let patient know that I've sent in lisinopril 10 mg for her to try.   1) Stop Amlodipine 2) Start lisinopril 3) Call on Tuesday with blood pressure numbers on lisinopril 4) Schedule follow-up visit in 1 month

## 2019-06-21 NOTE — Telephone Encounter (Signed)
Left message for patient to call back  

## 2019-06-21 NOTE — Telephone Encounter (Signed)
-----   Message from [place] sent at 06/20/2019  4:43 PM EDT ----- Patient advised. Patient states that her right leg is swollen also, not quite as big as left but is more swollen than it was. She is elevating her legs and uses compression stockings. Patient states swelling improves some with this but not by a lot. Patient wanted to know if she should try been off Amlodipine or any other suggestions?

## 2019-06-25 ENCOUNTER — Telehealth: Payer: Self-pay

## 2019-06-25 DIAGNOSIS — M8580 Other specified disorders of bone density and structure, unspecified site: Secondary | ICD-10-CM

## 2019-06-25 MED ORDER — VITAMIN D 50 MCG (2000 UT) PO TABS: 2000.0000 [IU] | ORAL_TABLET | Freq: Every day | ORAL | 3 refills | Status: DC

## 2019-06-25 NOTE — Telephone Encounter (Signed)
[facility] is helping patient with her medications and fixing her tablet box. We sent over her current medication list so they can review it and they are asking for RX for Vitamin D 2000 u BID that patient is taking per our record. Does patient need to continue this dose and directions?

## 2019-06-25 NOTE — Telephone Encounter (Addendum)
Pharmacist calling and is requesting patients medications be faxed ASAP pt is in the pharmacy with her pill box and is requesting help to get this organized.   [phone] ATTN: Vicente Males  [facility]  Nothing further needed.

## 2019-06-25 NOTE — Telephone Encounter (Signed)
Patient advised. Patient will call back on 06/28/19 with b/p readings. [facility] to [facility] and have [facility]ist help her with her medication box, patient felt confused with her medicine.

## 2019-06-28 ENCOUNTER — Ambulatory Visit (INDEPENDENT_AMBULATORY_CARE_PROVIDER_SITE_OTHER): Payer: Medicare Other | Admitting: *Deleted

## 2019-06-28 DIAGNOSIS — I482 Chronic atrial fibrillation, unspecified: Secondary | ICD-10-CM

## 2019-06-28 LAB — CUP PACEART REMOTE DEVICE CHECK
Battery Remaining Longevity: 154 mo
Battery Voltage: 3.2 V
Brady Statistic RV Percent Paced: 99.77 %
Date Time Interrogation Session: 20210408134412
Implantable Lead Implant Date: 20201214
Implantable Lead Location: 753860
Implantable Lead Model: 1948
Implantable Pulse Generator Implant Date: 20201214
Lead Channel Impedance Value: 418 Ohm
Lead Channel Impedance Value: 665 Ohm
Lead Channel Pacing Threshold Amplitude: 0.75 V
Lead Channel Pacing Threshold Pulse Width: 0.4 ms
Lead Channel Sensing Intrinsic Amplitude: 5.75 mV
Lead Channel Sensing Intrinsic Amplitude: 5.75 mV
Lead Channel Setting Pacing Amplitude: 2.5 V
Lead Channel Setting Pacing Pulse Width: 0.4 ms
Lead Channel Setting Sensing Sensitivity: 1.2 mV

## 2019-06-29 NOTE — Progress Notes (Signed)
PPM Remote  

## 2019-06-30 ENCOUNTER — Other Ambulatory Visit: Payer: Self-pay | Admitting: Family Medicine

## 2019-06-30 DIAGNOSIS — E039 Hypothyroidism, unspecified: Secondary | ICD-10-CM

## 2019-07-02 NOTE — Telephone Encounter (Signed)
Patient advised. Lab appointment made for 08/14/19. Called [facility] and cancelled any other thyroid doses on file so this does not get confused by the patient

## 2019-07-02 NOTE — Telephone Encounter (Signed)
Dr Einar Pheasant, last TSH level was ordered by neurologist in epic but I do not see any notes stating her results and if any medication changes were needed. Before I refilled thyroid medication I wanted to make sure she should continue on this dose.

## 2019-07-02 NOTE — Telephone Encounter (Signed)
Last TSH was showing too much thyroid hormone.   New prescription for lower dose sent to pharmacy. Please have patient schedule lab appointment for follow-up in 6 weeks

## 2019-07-16 ENCOUNTER — Other Ambulatory Visit: Payer: Self-pay

## 2019-07-16 ENCOUNTER — Ambulatory Visit
Admission: RE | Admit: 2019-07-16 | Discharge: 2019-07-16 | Disposition: A | Discharge status: Home / Self Care | Payer: Medicare Other | Source: Ambulatory Visit | Attending: Neurology | Admitting: Neurology

## 2019-07-16 DIAGNOSIS — R413 Other amnesia: Secondary | ICD-10-CM

## 2019-07-16 DIAGNOSIS — R296 Repeated falls: Secondary | ICD-10-CM | POA: Diagnosis not present

## 2019-07-17 ENCOUNTER — Telehealth: Payer: Self-pay

## 2019-07-17 NOTE — Telephone Encounter (Signed)
-----   Message from Cameron Sprang, MD sent at 07/17/2019 12:23 PM EDT ----- Pls let patient/family know the head CT did not show any evidence of tumor, stroke, or bleed. No change from prior scan. There are age-related changes seen, thanks

## 2019-07-17 NOTE — Telephone Encounter (Signed)
Spoke with [name] head CT did not show any evidence of tumor, stroke, or bleed. No change from prior scan. There are age-related changes seen, pt verbalized understanding

## 2019-07-18 ENCOUNTER — Ambulatory Visit: Payer: Medicare Other | Admitting: Psychology

## 2019-07-18 ENCOUNTER — Ambulatory Visit (INDEPENDENT_AMBULATORY_CARE_PROVIDER_SITE_OTHER): Payer: Medicare Other | Admitting: Counselor

## 2019-07-18 ENCOUNTER — Encounter: Payer: Self-pay | Admitting: Counselor

## 2019-07-18 ENCOUNTER — Other Ambulatory Visit: Payer: Self-pay

## 2019-07-18 DIAGNOSIS — F09 Unspecified mental disorder due to known physiological condition: Secondary | ICD-10-CM

## 2019-07-18 DIAGNOSIS — G319 Degenerative disease of nervous system, unspecified: Secondary | ICD-10-CM

## 2019-07-18 DIAGNOSIS — I6781 Acute cerebrovascular insufficiency: Secondary | ICD-10-CM

## 2019-07-18 NOTE — Progress Notes (Signed)
Psychometrist Note   Cognitive testing was administered to [name] by  , B.S. (Technician) under the supervision of Peter Stewart, Psy.D., ABN. [name] was able to tolerate all test procedures. Dr. Stewart met with the patient as needed to manage any emotional reactions to the testing procedures (if applicable). Rest breaks were offered.    The battery of tests administered was selected by Dr. Stewart with consideration to the patient's current level of functioning, the nature of her symptoms, emotional and behavioral responses during the interview, level of literacy, observed level of motivation/effort, and the nature of the referral question. This battery was communicated to the psychometrist. Communication between Dr. Stewart and the psychometrist was ongoing throughout the evaluation and Dr. Stewart was immediately accessible at all times. Dr. Stewart provided supervision to the technician on the date of this service, to the extent necessary to assure the quality of all services provided.    [name] will return in approximately one week for an interactive feedback session with Dr. Stewart, at which time female test performance, clinical impressions, and treatment recommendations will be reviewed in detail. The patient understands she can contact our office should she require our assistance before this time.   A total of 105 minutes of billable time were spent with [name] by the technician, including test administration and scoring time. Billing for these services is reflected in Dr. Stewart's note.   This note reflects time spent with the psychometrician and does not include test scores, clinical history, or any interpretations made by Dr. Stewart. The full report will follow in a separate note. 

## 2019-07-18 NOTE — Progress Notes (Signed)
[facility]  Patient Name: [name] MRN: [id number] Date of Birth: 04/22/1930 Age: 86 y.o. Education: 12 years  Referral Circumstances and Background Information  [name] is a 86 y.o., right-hand dominant, widowed woman with a history of memory and thinking problems over the past three years according to the patient's daughter. She presented with her aid Mickia, who is familiar with her current functioning but has only known her for several months. We were able to speak with her daughter and Power of Katherine Basset later in the encounter, who provided collateral history and status information. She apparently has at times resisted her daughter's attempts at caregiving and there has been some question as to whether she has the capacity to manage her own finances.    On interview, the patient reported that she fell and hit her head many times and also broke her collarbone and she thinks her head injuries are what are causing her memory and thinking problems. She sustained these injuries at home last December, was in the nursing home for a brief period of time, and then was discharged to home with home health. She does admit that she has a hard time remembering people's names but other than that, she thinks she can look after herself. When I asked if the patient was ok with the current arrangement with her daughter managing her finances, she told several long rambling stories. It sounds like in general, she realizes that she does need some help but isn't entirely convinced. 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 She stated that her daughter has some of her money invested and has been "switching everything around" and she isn't sure what is going on with it.   The patient is here with her aid Reynaldo Minium who has been working with her for several months  and reported that she is fairly independent compared to what she is used to, although she is used to working with patients who have advanced Alzheimer's and need help even with basic ADLs. She stated that the patient is able to cook some basic meals, such as corn on the cob or macaroni and cheese, or make sandwiches or chicken salad. She is able to take care of her basic self-care and showers although sometimes needs minor assistance related to mobility problems. She notices she isn't as good with remembering short term things and she "might not remember what happened last week," but she does well with remote recall of information. The patient is attended most of the day but gets about 4 hours of time by herself. The patient reported that her mood is generally fairly good, she sleeps well, her appetite is ok, and her energy is ok. She does get headaches and feels like she is congested a lot.   The patient's daughter reported that she noticed changes with her mothers memory and some mild cognitive difficulties as far back as 3 years ago. She would forget her medications, she also got lost while she was driving on a few occasions. She had several falls, one out of bed, one in the yard, and one when she got startled when the phone rang. She last fell on Labor Day Weekend in September, 2019 and was weak and disoriented and went into the nursing home. At that point, her daughter decided that she needed to have more supervision because the patient couldn't  really do things for herself. She was told by the doctor at the nursing home that the patient had mild dementia. She doesn't think that the patient has improved significantly since then and she continues to have difficulties handling her medications, she has a hard time with numbers (the patient's daughter attempts to discuss her finances with her and she has a hard time participating in that), and she resents that she should not drive. She also has a hard time with  the day of the week and gets disoriented with respect to time.   With respect to functioning, the patient's daughter has been managing her finances for the past year and a half since she has been in the nursing home. She hasn't driven since she has been in the nursing facility. She denies that she needed any help prior to going in the nursing facility and stated that she was totally independent. Her daughter helps with arranging medical appointments in the like.   Past Medical History and Review of Relevant Studies   Patient Active Problem List   Diagnosis Date Noted   Epigastric pain 06/12/2019   Localized swelling of left lower leg 04/11/2019   Memory loss 04/11/2019   Prediabetes 03/14/2019   Complete heart block ([place]) 03/05/2019   TIA (transient ischemic attack) 01/15/2019   Gait abnormality 01/15/2019   Transient ischemia 12/13/2018   Dysarthria 12/11/2018   Hypothyroidism 12/11/2018   Arthritis of right hand 08/17/2018   Chronic back pain 04/11/2018   Encounter for therapeutic drug monitoring 03/09/2018   Malignant hypertension 01/04/2018   Clavicle fracture 11/20/2017   Closed fracture of clavicle 11/20/2017   Fall    Accelerated hypertension    Carpal tunnel syndrome of left wrist 08/02/2017   Carpal tunnel syndrome of right wrist 08/02/2017   Bradycardia 02/02/2017   Presence of permanent cardiac pacemaker 01/28/2017   Chronic a-fib ([place]) 01/28/2017   Long term current use of anticoagulant therapy 01/28/2017   Chronic venous insufficiency 01/28/2017   Chronic atrial fibrillation ([place]) 01/28/2017   Peripheral venous insufficiency 01/28/2017   Cardiac pacemaker in situ 01/28/2017   Anticoagulated 01/28/2017   DOE (dyspnea on exertion) 09/22/2015   Dyspnea on exertion 09/22/2015   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 05/15/2014   After cataract of left eye not obscuring vision 05/15/2014   Bilateral dry eyes 05/15/2014    Dermatochalasis of eyelid 05/15/2014   Hyperopia of both eyes with astigmatism and presbyopia 05/15/2014   Irregular astigmatism of right eye 05/15/2014   Mechanical complication due to intraocular lens implant 05/15/2014   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 There is pronounced, likely pathological atrophy that is generalized with question of more in the mesial temporal lobes bilaterally. Some of her atrophy in the dorsal portions may be on the basis of vascular disease but her temporal atrophy is less well explained.   Current Outpatient Medications  Medication Sig Dispense Refill   acetaminophen (TYLENOL) 500 MG tablet Take 1,000 mg by mouth every 8 (eight) hours as needed for moderate pain or headache.      apixaban (ELIQUIS) 5 MG TABS tablet Take 1 tablet (5 mg total) by mouth 2 (two) times daily. 60 tablet 6   atorvastatin (  LIPITOR) 20 MG tablet Take 1 tablet (20 mg total) by mouth every evening. 90 tablet 1   Carboxymethylcell-Hypromellose (GENTEAL OP) Place 1 drop into both eyes daily as needed (dry eyes).     Cholecalciferol (VITAMIN D) 50 MCG (2000 UT) tablet Take 1 tablet (2,000 Units total) by mouth daily. 90 tablet 3   Cyanocobalamin (B-12) 1000 MCG CAPS TAKE 1 CAPSULE BY MOUTH EVERY DAY 30 capsule 35   digoxin (LANOXIN) 0.125 MG tablet Take 0.125 mg by mouth daily. Hold for pulse less than 60.     fluticasone (FLONASE) 50 MCG/ACT nasal spray Place 1 spray into both nostrils daily as needed for allergies or rhinitis.     levothyroxine (SYNTHROID) 100 MCG tablet Take 1 tablet (100 mcg total) by mouth daily. 90 tablet 1   lisinopril (ZESTRIL) 10 MG tablet Take 1 tablet (10 mg total) by mouth  daily. 90 tablet 3   oxybutynin (DITROPAN-XL) 5 MG 24 hr tablet Take 5 mg by mouth at bedtime.      sertraline (ZOLOFT) 25 MG tablet Take 25 mg by mouth every evening.      No current facility-administered medications for this visit.    Family History  Problem Relation Age of Onset   Diabetes Mother    Mental illness Mother    Diabetes Father    Heart attack Father    Psychosocial History  Developmental, Educational and Employment History: The patient reported that she did adequately in school, was never held back, and didn't have any learning problems. 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 The land belonged to her grandfather. This was about 15 years ago. She retired when she went in the nursing home.   Psychiatric History: The patient denied any significant psychiatric history.   Substance Use History: The patient doesn't drink, has never used any drugs, and used to smoke although she quit many years ago.   Relationship History and Living Cimcumstances: The patient was married to her husband for approximately 87 years. They have The patient has two children, a son [name] and a daughter [name]. Her son lives nearby but is less involved in her care.   Mental Status and Behavioral Observations  Sensorium/Arousal: The patient's level of arousal was awake and alert. Hearing and vision were adequate for testing purposes. Orientation: The patient was oriented to person, place, and situation but was not well oriented to time (she knew the year but stated the month was June/July). Appearance: Dressed in appropriate, casual clothing with reasonable grooming and hygiene.  Behavior: The patient was generally appropriate although tired quickly with the cognitive testing Speech/language: Normal in rate, rhythm, and volume without word finding pauses or paraphasias in spontaneous speech.    Gait/Posture: Gait was cautious and she ambulated using a walker Movement: No overt signs/symptoms of movement disorder, though it did appear as though she had a tremor in her thumb at one point on psychomotor testing Social Comportment: Appropriate, pleasant Mood: "Good" Affect: Mainly neutral with some underlying irritability towards her daughter Thought process/content: Ms. Henrene Dodge thought process was quite circumstantial and she required a fair amount of redirection to provide useful information in response to questions. Nevertheless, with some scaffolding she was able to coherently describe most of her timeline. She did have some notable repeating within the encounter. Thought content was appropriate to topics discussed.  Safety: No safety concerns identified at today's encounter.  Insight: Limited, patient seems to not appreciate what appear to be fairly significant cognitive difficulties although she does voice some awareness  MMSE - Mini Mental State Exam 07/18/2019  Orientation to time 2  Orientation to Place 4  Registration 3  Attention/ Calculation 5  Recall 0  Language- name 2 objects 2  Language- repeat 1  Language- follow 3 step command 2  Language- read & follow direction 1  Write a sentence 1  Copy design 0  Total score 21  MMSE with Serial 7's: 19/30  Test Procedures  Wide Range Achievement Test - 4             Word Reading Neuropsychological Assessment Battery  List Learning  Story Learning  Daily Living Memory  Naming  Digit Span Repeatable Battery for the Assessment of Neuropsychological Status (Form A)  Figure Copy  Judgment of Line Orientation  Coding  Figure Recall The Dot Counting Test A Random Letter Test Controlled Oral Word Association (F-A-S) Semantic Fluency (Animals) Trail Making Test A & B Complex Ideational Material Geriatric Depression Scale - Short Form Quick Dementia Rating System (completed by aid, Mickia)  [place]  M[place]heimer was seen for a psychiatric diagnostic evaluation and neuropsychological testing. On interview, she does present as fairly lucid in some capacities, but her thought process is quite circumstantial. It seems as though she is inconsistent as I see documentation from her PCP that she does not want to have helpers in the home and wants to regain control of her fianances, although today she says she is happy with aids and is also ok with her daughter managing her financial affairs, albeit she admitted that somewhat reluctantly. Her CT head is concerning for neurodegeneration and she is screening in the mild dementia range on the MMSE. Full and complete note with impressions, recommendations, and interpretation of test data to follow.   Viviano Simas Nicole Kindred, PsyD, [facility] Clinical Neuropsychologist  Informed Consent and Coding/Compliance  Risks and benefits of the evaluation were discussed with the patient as were the limits of confidentiality. I conducted a clinical interview and neuropsychological testing (more than two tests) with Gearldine Shown Didion and Milana Kidney, B.S. (Technician) assisted me in administering additional test procedures. The patient was able to tolerate the testing procedures and the patient (and/or family if applicable) is likely to benefit from further follow up to receive the diagnosis and treatment recommendations, which will be rendered at the next encounter. Billing below reflects technician time, my direct face-to-face time with the patient, time spent in test administration, and time spent in professional activities including but not limited to: neuropsychological test interpretation, integration of neuropsychological test data with clinical history, report preparation, treatment planning, care coordination, and review of diagnostically pertinent medical history or studies.   Services associated with this encounter: Clinical Interview [phone]) plus 60 minutes [id number];  Neuropsychological Evaluation by Professional)  180 minutes [id number]; Neuropsychological Evaluation by Professional, Adl.) 30 minutes [id number]; Test Administration by Professional) 30 minutes [id number]; Neuropsychological Testing by Technician) 75 minutes [id number]; Neuropsychological Testing by Technician, Adl.)

## 2019-07-19 ENCOUNTER — Encounter: Payer: Self-pay | Admitting: Counselor

## 2019-07-19 NOTE — Progress Notes (Signed)
[facility]  Patient Name: [name] MRN: [id number] Date of Birth: 12-02-1930 Age: 86 y.o. Education: 13 years  Measurement properties of test scores: IQ, Index, and Standard Scores (SS): Mean = 100; Standard Deviation = 15 Scaled Scores (Ss): Mean = 10; Standard Deviation = 3 Z scores (Z): Mean = 0; Standard Deviation = 1 T scores (T); Mean = 50; Standard Deviation = 10  TEST SCORES:    Note: This summary of test scores accompanies the interpretive report and should not be considered in isolation without reference to the appropriate sections in the text. Test scores are relative to age, gender, and educational history as available and appropriate.   Performance Validity        "A" Random Letter Test Raw  Descriptor      Errors 1 Within Expectation  The Dot Counting Test: 18 Within Expectation      Mental Status Screening        MMSE Total Score Descriptor      "World" 21 Mild Dementia      Serial 7's 19 Moderate Dementia  Expected Functioning        Wide Range Achievement Test: Standard/Scaled Score Percentile      Word Reading 104 61      Attention/Processing Speed        Neuropsychological Assessment Battery (Attention Module, Form 1): T-score Percentile      Digits Forward 45 31      Digits Backwards 55 69      Repeatable Battery for the Assessment of Neuropsychological Status (Form A): Scaled Score Percentile      Coding 4 2      Language        Neuropsychological Assessment Battery (Language Module, Form 1): T-score Percentile      Naming   (27) 42 21      Verbal Fluency: T-score Percentile      Controlled Oral Word Association (F-A-S) 36 8      Semantic Fluency (Animals) 21 <1      Memory:        Neuropsychological Assessment Battery (Memory Module, Form 1): T-score Percentile      List Learning           List A Immediate Recall   (2, 5, 5) 32 4         List B Immediate Recall   (2) 39 14          List A Short Delayed Recall   (2) 30 2         List A Long Delayed Recall   (1) 31 3         List A Long Delayed Yes/No Recognition Hits   (10) --- 31         List A Long Delayed Yes/No Recognition False Alarms   (13) --- 3         List A Recognition Discriminability Index --- 1     Story Learning           Immediate Recall   (14, 17) 26 1         Delayed Recall   (12) 30 2      Daily Living Memory            Immediate Recall   (20, 11) 38 12          Delayed Recall   (4, 0) 23 <1  Recognition Hits --- 2      Repeatable Battery for the Assessment of Neuropsychological Status (Form A): Scaled Score Percentile         Figure Recall   (6) 6 9      Visuospatial/Constructional Functioning        Repeatable Battery for the Assessment of Neuropsychological Status (Form A): Standard/Scaled Score Percentile     Visuospatial/Constructional Index 87 19         Figure Copy   (19) 12 75         Judgment of Line Orientation   (9) --- <2      Executive Functioning        Trail Making Test: T-Score Percentile      Part A 34 5      Part B 35 7      [place] Diagnostic Aphasia Exam: Raw Score Scaled Score      Complex Ideational Material 11 9      Clock Drawing Raw Score Descriptor      Command 9 WNL      Rating Scales         Raw Score Descriptor  Quick Dementia Rating System        Sum of Boxes 7 Mild Dementia      Total Score 11 Mild Dementia  Geriatric Depression Scale - Short Form 2 WNL   Peter V. Nicole Kindred PsyD, [place] Clinical Neuropsychologist

## 2019-07-20 ENCOUNTER — Telehealth: Payer: Self-pay | Admitting: Neurology

## 2019-07-20 NOTE — Telephone Encounter (Signed)
Spoke with pt unsure of who called her, informed that they will call her back if they needed her

## 2019-07-20 NOTE — Telephone Encounter (Signed)
Pt states that she is returning a call to our office and not sure who called

## 2019-07-23 NOTE — Progress Notes (Signed)
[facility]  Patient Name: [name] MRN: [id number] Date of Birth: 02-23-1931 Age: 86 y.o. Education: 13 years  Clinical Impressions  [name] is a 86 y.o., right-hand dominant, widowed woman with a history of subtle problems with memory and thinking and then significant decline after a hospitalization and rehab stay related to a series of falls in September, 2019. She eventually made it back home although her daughter is now managing her financial affairs and has arranged for 20 hour a day home health support. The patient has been somewhat inconsistent and at times has said she would like to manage her own affairs but on interview with me, she admitted she cannot manage her finances and said she is happy getting in home care and assistance from her daughter. 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  On neuropsychological evaluation, there is suggestion of impaired cognitive ability in several areas, including on measures of memory, verbal fluency, processing speed, and select indicators of executive function. She did recall some structured verbal information and visual information, suggesting a bit of residual storage capacity. Nevertheless, my overall impression is that this is a developing storage problem. Digit repetition and constructional abilities appeared to be well preserved. She was characterized as functioning at a mild dementia level by her aid and screened negative for the presence of depression.   [name] is thus demonstrating a level of impairment amounting to a mild dementia level of function. Differential favors mixed Alzheimer's and vascular disease in lieu of her imaging, test performance, and cognitive features. She has difficulty consistently expressing a  preference, she has anosognosia to some extent suggesting that she lacks appreciation of her limitations, and she has cognitive impairment that could reasonably be expected to interfere with her ability to reason in complex situations. Therefore, it is my opinion that she is not capable of managing high-level affairs such as medical or financial decisions of high risk and that those decisions should be made with the assistance of a representative.   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 In the context of your clinical history, neuroimaging of the brain, and demographics, I think that you likely have reached a mild dementia level of function.   Dementia refers to a group of syndromes where multiple areas of ability are damaged in the brain, such as memory, thinking, judgment, and behavior, and most commonly refer to age related causes of dementia that cause worsening in these abilities over time. Alzheimer's disease is the most common form of dementia in people over the age of 57. Not all dementias are Alzheimer's disease, but all Alzheimer's disease is dementia. When dementia is due to an underlying condition affecting the brain, such as Alzheimer's disease, there is progression over time, which typically procedes gradually over many years.   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 I think that Alzheimer's disease is the major  driver of your symptoms but there also may be some contribution from  wear and tear on blood vessels of the brain over time, which we can see on your CT scan of the brain. This means that your condition will worsen over time.   Decision making is a complex ability and it is challenging to evaluate. It is generally accepted that there are four primary components of decision making including: 1). The ability to consistently express a preference 2). The ability to understand information pertinent to the decision at hand, including risks and benefits.  3). The ability to reason with that information in accordance with ones own values and circumstances 4). Appreciation for the consequences of the decision as applied to an individuals own unique circumstances.   During your evaluation, you had difficulty consistently expressing a preference (I.e., sometimes, you think it is fine that you get help from your daughter and aids and other times, you do not wish to receive that help). I also think that you have difficulty appreciating the care that you need due to what is called anosognosia, which is a fancy term for not being entirely aware of the problems one has, which is very common in dementia. I would therefore recommend that medical and financial decisions of high risk should be deferred to your daughter. You are certainly capable of making basic decisions and managing many aspects of your own affairs. I would also remind you that a power of attorney's primary responsibility to to safeguard the individual they are representing and to make the best decisions in keeping with that person's values and preferences, not to take control of the person's affairs for their own gain. This means it is actually beneficial for you to have a representative (such as your daughter) make decisions on your behalf if it is needed.    You and your daughter argue over whether you should drive or not. Your daughter thought that you were told by your doctor's at the nursing facility/rehab to stop  driving but you said you were not. At this point, I have concerns about you operating a motor vehicle and would recommend that you do not drive.   Some cognitive abilities (such as processing speed) naturally decline with age, but there are many things you can do to contribute to healthy cognitive aging. There is evidence from at least one study that a modified low carbohydrate mediterranean diet (the MIND-DASH) diet can contribute to healthy cognitive aging. There is also some evidence to suggest a beneficial effect of coffee (black coffee without sugar or other additives such as cream) for healthy aging. One glass of red wine a day may also contribute to healthy aging though at two drinks you lose all benefit and at three drinks it may be doing more harm than good. Staying active, mentally and physically, are also crucially important. One of the best ways to do this is simply to stay active and engaged in your life, particularly with social activities. Challenging the mind and other cognitively stimulating activities are encouraged, consider learning a new hobby, reconnecting with old friends, reading an interesting and thought provoking book. It is not so much what you do that is important as it is that you enjoy it and stay at it.   Exercise is one of the best medicines for promoting health and maintaining cognitive fitness at all stages in life. Exercise probably has the largest documented effect on brain health and performance of any intervention. Studies have shown that  even previously sedentary individuals who start exercising as late as age 20 show a significant survival benefit as compared to their non-exercising peers. In the [place], the current guidelines are for 30 minutes of moderate exercise per day, but increasing your activity level less than that may also be helpful. You do not have to get your 30 minutes of exercise in one shot and exercising for short periods of time spread throughout  the day can be helpful.   Test Findings  Test scores are summarized in additional documentation associated with this encounter. Test scores are relative to age, gender, and educational history as available and appropriate. There were no concerns about performance validity as all findings fell within normal expectations.   General Intellectual Functioning/Achievement:  Performance on single word reading was squarely within the average range, which presents as a reasonable standard of comparison for the patient's cognitive test performance.   Attention and Processing Efficiency: Indicators of attention were within normal limits with average range scores on measures of digit repetition forward and backward.   On timed measures of processing efficiency, performance was low with extremely low timed number-symbol coding and weak low average nearly unusually low performance on simple numeric sequencing, which is a very easy task.   Language: Language findings demonstrated intact visual object confrontation naming yet extremely low semantic fluency. Phonemic fluency was unusually low.   Visuospatial Function: Visuospatial and constructional functioning fell at a reasonable low average level. Ms. Pinera demonstrated nearly errorless figure copy. By contrast, she had difficulty with judgment of angular line orientations. As per technician she had a hard time understanding the task so this may reflect generalized cognitive impairment as opposed to a focal visuospatial issue per se.   Learning and Memory: Measures of learning and memory suggested deficient capacities albeit with some residual memory storage ability given that she remembered some information across time. Her delayed recall was better for structured as opposed to unstructured information, suggesting an executive contribution.   In the verbal realm, immediate recall was 2, 5, and 5 words of a 12-item word list across three learning  trials, which is unusually low. She recalled 2 of those words on short delayed recall and then only 1 word on long delayed recall, which is poor retention of information across time and or susceptibility to retroactive interference. Memory for a short story was extremely low across two repetitions but she recalled this information better and achieved an unusually low score on delayed recall. Memory for brief daily living information was low average on immediate recall and extremely low on delayed recall with comparable recognition performance.   In the visual realm, delayed recall of a modestly complex geometric figure was unusually low.   Executive Functions: Performance on executive indicators was concerning for some impairment, although she became fatigued at this point in the test battery and as such the domain was sparsely assessed. Generation of words in response to letter prompts and alternating sequencing of numbers and letters of the alphabet was extremely low. She did better when reasoning with verbal information, which was average, and her clock drawing was within normal limits with good numbers and hand placement but no size difference between hands.   Rating Scale(s): Ms. Staup screened negative for the presence of depression on self-rating of symptoms. She was characterized as functioning at a mild dementia level by her aid Mickia.   Viviano Simas Nicole Kindred PsyD, [place] Clinical Neuropsychologist

## 2019-07-25 ENCOUNTER — Other Ambulatory Visit: Payer: Self-pay

## 2019-07-25 ENCOUNTER — Ambulatory Visit (INDEPENDENT_AMBULATORY_CARE_PROVIDER_SITE_OTHER): Payer: Medicare Other | Admitting: Counselor

## 2019-07-25 DIAGNOSIS — G301 Alzheimer's disease with late onset: Secondary | ICD-10-CM | POA: Diagnosis not present

## 2019-07-25 DIAGNOSIS — F0281 Dementia in other diseases classified elsewhere with behavioral disturbance: Secondary | ICD-10-CM | POA: Diagnosis not present

## 2019-07-25 DIAGNOSIS — F0151 Vascular dementia with behavioral disturbance: Secondary | ICD-10-CM

## 2019-07-25 DIAGNOSIS — F02818 Dementia in other diseases classified elsewhere, unspecified severity, with other behavioral disturbance: Secondary | ICD-10-CM

## 2019-07-25 DIAGNOSIS — F01518 Vascular dementia, unspecified severity, with other behavioral disturbance: Secondary | ICD-10-CM

## 2019-07-25 NOTE — Patient Instructions (Signed)
Your performance and presentation on neuropsychological assessment were consistent with difficulties in several areas, including in some measures that are characteristic of the type of problems we often see in individuals with Alzheimer's disease such as memory and semantic verbal fluency (e.g., generating words in a category). In the context of your clinical history, neuroimaging of the brain, and demographics, I think that you likely have reached a mild dementia level of function.   Dementia refers to a group of syndromes where multiple areas of ability are damaged in the brain, such as memory, thinking, judgment, and behavior, and most commonly refer to age related causes of dementia that cause worsening in these abilities over time. Alzheimer's disease is the most common form of dementia in people over the age of 70. Not all dementias are Alzheimer's disease, but all Alzheimer's disease is dementia. When dementia is due to an underlying condition affecting the brain, such as Alzheimer's disease, there is progression over time, which typically procedes gradually over many years.   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 I think that Alzheimer's disease is the major driver of your symptoms but there also may be some contribution from wear and tear on blood vessels of the brain over time, which we can see on your CT scan of the brain. This means that your condition will worsen over time.   Decision making is a complex ability and it is challenging to evaluate. It is generally accepted that there are four primary components of decision making including: 1). The ability to consistently express a preference 2). The ability to understand information pertinent to the decision at hand, including risks and benefits.  3). The ability to reason with that information in accordance with ones own values and circumstances 4). Appreciation  for the consequences of the decision as applied to an individuals own unique circumstances.   During your evaluation, you had difficulty consistently expressing a preference (I.e., sometimes, you think it is fine that you get help from your daughter and aids and other times, you do not wish to receive that help). I also think that you have difficulty appreciating the care that you need due to what is called anosognosia, which is a fancy term for not being entirely aware of the problems one has, which is very common in dementia. I would therefore recommend that medical and financial decisions of high risk should be deferred to your daughter. You are certainly capable of making basic decisions and managing many aspects of your own affairs. I would also remind you that a power of attorney's primary responsibility to to safeguard the individual they are representing and to make the best decisions in keeping with that person's values and preferences, not to take control of the person's affairs for their own gain. This means it is actually beneficial for you to have a representative (such as your daughter) make decisions on your behalf if it is needed.    I would recommend that you consult with an elder care attorney regarding advanced directives if you have not, such as a healthcare power of attorney and living will. Consultation with an elder care attorney can help you protect your estate and communicate your preferences to your loved ones formally, in the event you are not able to do so yourself. I can provide my strong recommendation for the [employer], who are located at [address] in [place], [place]. They  can be reached at [phone]. They also have a website [email or web address].   You and your daughter argue over whether you should drive or not. Your daughter thought that you were told by your doctor's at the nursing facility/rehab to stop driving but you said you were not. At this  point, I have concerns about you operating a motor vehicle and would recommend that you do not drive.   Some cognitive abilities (such as processing speed) naturally decline with age, but there are many things you can do to contribute to healthy cognitive aging. There is evidence from at least one study that a modified low carbohydrate mediterranean diet (the MIND-DASH) diet can contribute to healthy cognitive aging. There is also some evidence to suggest a beneficial effect of coffee (black coffee without sugar or other additives such as cream) for healthy aging. One glass of red wine a day may also contribute to healthy aging though at two drinks you lose all benefit and at three drinks it may be doing more harm than good. Staying active, mentally and physically, are also crucially important. One of the best ways to do this is simply to stay active and engaged in your life, particularly with social activities. Challenging the mind and other cognitively stimulating activities are encouraged, consider learning a new hobby, reconnecting with old friends, reading an interesting and thought provoking book. It is not so much what you do that is important as it is that you enjoy it and stay at it.   Exercise is one of the best medicines for promoting health and maintaining cognitive fitness at all stages in life. Exercise probably has the largest documented effect on brain health and performance of any intervention. Studies have shown that even previously sedentary individuals who start exercising as late as age 42 show a significant survival benefit as compared to their non-exercising peers. In the [place], the current guidelines are for 30 minutes of moderate exercise per day, but increasing your activity level less than that may also be helpful. You do not have to get your 30 minutes of exercise in one shot and exercising for short periods of time spread throughout the day can be helpful.

## 2019-07-25 NOTE — Progress Notes (Signed)
[facility]  I met with [name] to review the findings resulting from her neuropsychological evaluation. Since the last appointment, things have been the same. Her daughter [name] was able to participate via phone (Phone: [phone]). Time was spent reviewing the impressions and recommendations that are detailed in the evaluation report. The patient had a hard time understanding the specifics but did get progressively more agitated as the encounter went on. I think she often misrepresents and misinterprets her daughter's help. For example, she stated that her daughter had told her she needed to throw out everything in her closet. Her daughter clarified that she told the patient her aid [name] could help her get rid of some things, if she wished, because the patient had been complaining about clothes that don't fit. We reviewed driving safety and I shared my impression that it is probably better for [name] to not drive, she was getting lost before, and that is several years ago. Interventions provided during this encounter included psychoeducation, care coordination, and other topics as reflected in the patient instructions. I took time to explain the findings and answer all the patient's questions. I encouraged [name] to contact me should she have any further questions or if further follow up is desired.   Current Medications and Medical History   Current Outpatient Medications  Medication Sig Dispense Refill  . acetaminophen (TYLENOL) 500 MG tablet Take 1,000 mg by mouth every 8 (eight) hours as needed for moderate pain or headache.     Marland Kitchen apixaban (ELIQUIS) 5 MG TABS tablet Take 1 tablet (5 mg total) by mouth 2 (two) times daily. 60 tablet 6  . atorvastatin (LIPITOR) 20 MG tablet Take 1 tablet (20 mg total) by mouth every evening. 90 tablet 1  . Carboxymethylcell-Hypromellose (GENTEAL OP) Place 1 drop into both eyes daily as needed  (dry eyes).    . Cholecalciferol (VITAMIN D) 50 MCG (2000 UT) tablet Take 1 tablet (2,000 Units total) by mouth daily. 90 tablet 3  . Cyanocobalamin (B-12) 1000 MCG CAPS TAKE 1 CAPSULE BY MOUTH EVERY DAY 30 capsule 35  . digoxin (LANOXIN) 0.125 MG tablet Take 0.125 mg by mouth daily. Hold for pulse less than 60.    . fluticasone (FLONASE) 50 MCG/ACT nasal spray Place 1 spray into both nostrils daily as needed for allergies or rhinitis.    Marland Kitchen levothyroxine (SYNTHROID) 100 MCG tablet Take 1 tablet (100 mcg total) by mouth daily. 90 tablet 1  . lisinopril (ZESTRIL) 10 MG tablet Take 1 tablet (10 mg total) by mouth daily. 90 tablet 3  . oxybutynin (DITROPAN-XL) 5 MG 24 hr tablet Take 5 mg by mouth at bedtime.     . sertraline (ZOLOFT) 25 MG tablet Take 25 mg by mouth every evening.      No current facility-administered medications for this visit.   Patient Active Problem List   Diagnosis Date Noted  . Epigastric pain 06/12/2019  . Localized swelling of left lower leg 04/11/2019  . Memory loss 04/11/2019  . Prediabetes 03/14/2019  . Complete heart block ([place]) 03/05/2019  . TIA (transient ischemic attack) 01/15/2019  . Gait abnormality 01/15/2019  . Transient ischemia 12/13/2018  . Dysarthria 12/11/2018  . Hypothyroidism 12/11/2018  . Arthritis of right hand 08/17/2018  . Chronic back pain 04/11/2018  . Encounter for therapeutic drug monitoring 03/09/2018  . Malignant hypertension 01/04/2018  . Clavicle fracture 11/20/2017  . Closed fracture of clavicle 11/20/2017  . Fall   .  Accelerated hypertension   . Carpal tunnel syndrome of left wrist 08/02/2017  . Carpal tunnel syndrome of right wrist 08/02/2017  . Bradycardia 02/02/2017  . Presence of permanent cardiac pacemaker 01/28/2017  . Chronic a-fib ([place]) 01/28/2017  . Long term current use of anticoagulant therapy 01/28/2017  . Chronic venous insufficiency 01/28/2017  . Chronic atrial fibrillation ([place]) 01/28/2017  . Peripheral venous  insufficiency 01/28/2017  . Cardiac pacemaker in situ 01/28/2017  . Anticoagulated 01/28/2017  . DOE (dyspnea on exertion) 09/22/2015  . Dyspnea on exertion 09/22/2015  . 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 05/15/2014  . After cataract of left eye not obscuring vision 05/15/2014  . Bilateral dry eyes 05/15/2014  . Dermatochalasis of eyelid 05/15/2014  . Hyperopia of both eyes with astigmatism and presbyopia 05/15/2014  . Irregular astigmatism of right eye 05/15/2014  . Mechanical complication due to intraocular lens implant 05/15/2014  . 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 She was alert and generally oriented although I am not certain she remembered her appointment because she did not present as familiar with me until I reminded her. Her speech was clear in rate, rhythm, and volume. Her self-reported mood was "I'm getting all worked up" and her affect was mildly agitated. Her thought process was circumstantial to tangential and her thought content was focused on her perception that she can "do just fine" for herself. No safety concerns were identified at this visit.   Plan  Feedback provided regarding the patient's neuropsychological evaluation. It is my opinion that she has lost the capacity to independently make medical and financial decisions of high risk. This impression was reinforced by her difficulty participating in conversations about the same during the present encounter. I referred her daughter to the [employer] if it is needed. I suggested that the patient does not drive. A trial of Aricept is also warranted and she is also on anticholinergic medications (oxybutinin) that might be reviewed by a medical treatment provider.  [name] was encouraged to contact me if any questions arise or if further  follow up is desired.   Jennifer Simas Nicole Kindred, PsyD, ABN Clinical Neuropsychologist  Service(s) Provided at This Encounter: 50 minutes [phone]; Conjoint therapy with patient present)

## 2019-07-28 DIAGNOSIS — I495 Sick sinus syndrome: Secondary | ICD-10-CM | POA: Insufficient documentation

## 2019-07-30 ENCOUNTER — Other Ambulatory Visit: Payer: Self-pay

## 2019-07-30 ENCOUNTER — Encounter: Payer: Self-pay | Admitting: Internal Medicine

## 2019-07-30 ENCOUNTER — Ambulatory Visit (INDEPENDENT_AMBULATORY_CARE_PROVIDER_SITE_OTHER): Payer: Medicare Other | Admitting: Internal Medicine

## 2019-07-30 VITALS — BP 168/98 | HR 87 | Ht 63.0 in | Wt 142.0 lb

## 2019-07-30 DIAGNOSIS — I482 Chronic atrial fibrillation, unspecified: Secondary | ICD-10-CM

## 2019-07-30 DIAGNOSIS — Z950 Presence of cardiac pacemaker: Secondary | ICD-10-CM | POA: Diagnosis not present

## 2019-07-30 DIAGNOSIS — I495 Sick sinus syndrome: Secondary | ICD-10-CM

## 2019-07-30 DIAGNOSIS — I350 Nonrheumatic aortic (valve) stenosis: Secondary | ICD-10-CM

## 2019-07-30 NOTE — Progress Notes (Signed)
.      Patient Care Team: [name], MD as PCP - General (Family Medicine)   HPI  [name] is a 86 y.o. female SEEN IN followup the pacemaker originally implanted by Dr. Doreatha Lew 25 years ago.  She has a history of complete heart block now permanent atrial fibrillation and severe orthostatic hypotension  She underwent device generator replacement 11/18.  When seen 11/20, concerns of her very old Intermedics lead and impending lead failure; we undertook premature generator replacement and lead replacement.  Date Cr K TSH Hgb Dig  10/20 0.91 4.1 0.05 13.    3/21 0.99 4.6 0.15 (dose adjusted)  1.1     DATE TEST EF   9/20 Echo   55-65 % AS mod--mean grad 23              She has few complaints related to exercise intolerance, some gait issues. Some lightheadedness with standing    Bigger complaints relate to 1) decreased mobility of L shoulder 2) protruding of the capped lead on the pts abandoned site     Past Medical History:  Diagnosis Date  . Arrhythmia    chronic atrial fib  . Atrial fibrillation, chronic ([place])   . Chronic anticoagulation   . Chronic atrial fibrillation ([place]) 01/28/2017  . Chronic venous insufficiency 01/28/2017  . Clavicle fracture 11/20/2017  . Complication of anesthesia    " difficult waking "  . Long term current use of anticoagulant therapy 01/28/2017  . Osteoarthritis   . Presence of permanent cardiac pacemaker 01/28/2017   Original implant 1992 Lead Intermedics 431-04 Generator change 2001 and 2011.  Medtronic generator.   . SSS (sick sinus syndrome) ([place])   . Thyroid disease    hypothyroidism  . TIA (transient ischemic attack)     Past Surgical History:  Procedure Laterality Date  . ABDOMINAL HYSTERECTOMY    . INSERT / REPLACE / REMOVE PACEMAKER     generator change 2011 medtronic sigma  . KNEE SURGERY    . LEAD INSERTION N/A 03/05/2019   Procedure: LEAD INSERTION - RV LEAD;  Surgeon: Deboraha Sprang, MD;  Location:  [facility];  Service: Cardiovascular;  Laterality: N/A;  . PACEMAKER IMPLANT N/A 03/05/2019   Procedure: PACEMAKER IMPLANT;  Surgeon: Deboraha Sprang, MD;  Location: [facility];  Service: Cardiovascular;  Laterality: N/A;  . PACEMAKER INSERTION    . PARS PLANA VITRECTOMY Right 07/19/2018   Procedure: VITRECTOMY WITH VITREOUS BIPOSY & ENDOLASER;  Surgeon: Jalene Mullet, MD;  Location: [facility];  Service: Ophthalmology;  Laterality: Right;  . PPM GENERATOR CHANGEOUT N/A 02/02/2017   Procedure: PPM GENERATOR CHANGEOUT;  Surgeon: Deboraha Sprang, MD;  Location: [facility];  Service: Cardiovascular;  Laterality: N/A;  . REPLACEMENT TOTAL KNEE BILATERAL    . VARICOSE VEIN SURGERY      Current Outpatient Medications  Medication Sig Dispense Refill  . acetaminophen (TYLENOL) 500 MG tablet Take 1,000 mg by mouth every 8 (eight) hours as needed for moderate pain or headache.     Marland Kitchen apixaban (ELIQUIS) 5 MG TABS tablet Take 1 tablet (5 mg total) by mouth 2 (two) times daily. 60 tablet 6  . atorvastatin (LIPITOR) 20 MG tablet Take 1 tablet (20 mg total) by mouth every evening. 90 tablet 1  . Carboxymethylcell-Hypromellose (GENTEAL OP) Place 1 drop into both eyes daily as needed (dry eyes).    . Cholecalciferol (VITAMIN D) 50 MCG (2000 UT)  tablet Take 1 tablet (2,000 Units total) by mouth daily. 90 tablet 3  . Cyanocobalamin (B-12) 1000 MCG CAPS TAKE 1 CAPSULE BY MOUTH EVERY DAY 30 capsule 35  . digoxin (LANOXIN) 0.125 MG tablet Take 0.125 mg by mouth daily. Hold for pulse less than 60.    . fluticasone (FLONASE) 50 MCG/ACT nasal spray Place 1 spray into both nostrils daily as needed for allergies or rhinitis.    Marland Kitchen levothyroxine (SYNTHROID) 100 MCG tablet Take 1 tablet (100 mcg total) by mouth daily. 90 tablet 1  . lisinopril (ZESTRIL) 10 MG tablet Take 1 tablet (10 mg total) by mouth daily. 90 tablet 3  . oxybutynin (DITROPAN-XL) 5 MG 24 hr tablet Take 5 mg by mouth at bedtime.     .  sertraline (ZOLOFT) 25 MG tablet Take 25 mg by mouth every evening.      No current facility-administered medications for this visit.    Allergies  Allergen Reactions  . Adhesive [Tape] Other (See Comments)    TAPE WILL TEAR THE SKIN!!!!  . Penicillins Hives and Rash    Did it involve swelling of the face/tongue/throat, SOB, or low BP? No Did it involve sudden or severe rash/hives, skin peeling, or any reaction on the inside of your mouth or nose? Yes Did you need to seek medical attention at a hospital or doctor's office? Unknown  When did it last happen?unknown  If all above answers are "NO", may proceed with cephalosporin use.       Review of Systems negative except from HPI and PMH  Physical Exam BP (!) 168/98   Pulse 87   Ht 5\' 3"  (1.6 m)   Wt 142 lb (64.4 kg)   SpO2 98%   BMI 25.15 kg/m  Well developed and well nourished in no acute distress HENT normal Neck supple   Clear Device pocket well healed; without hematoma or erythema.  There is no tethering  Protruding of a capped lead on the Right Regular rate and rhythm, 2/6 systolic murmur Abd-soft with active BS No Clubbing cyanosis chronic edema with significant venous varicosities Skin-warm and dry A & Oriented  Grossly normal sensory and motor function  ECG *was reviewed from 12/11/2018 ventricular pacing with atrial fibrillation   Assessment and  Plan  Intermittent complete heart block with intrinsic conduction today  Orthostatic hypotension  Hypertension-severe  Atrial fibrillation permanent  Adhesive capsulitis very welcome I got your numbers from your husband.  I have not 59 year old lady who has adhesive capsulitis following her pacemaker.  Was trying to find the name of a good therapist or someone like her.  She tends to be chronic.  Artery good  Left leg swelling  Pacemaker-Medtronic  The patient's device was interrogated.  The information was reviewed.     There is protruding of one of  the Leads on her right side.  It is quite annoying.  We discussed surgical revision including the risk of infection.  For right now she will defer.  She has developed adhesive capsulitis of her left shoulder.  We will refer her to physical therapy.  Heart rate excursion is rather brisk we have decreased her threshold from the low--medium low and decreased her ADL rate from 95--90.  We have activated sleep mode.  Aortic stenosis is moderate.  We will plan to recheck her again in about 6 months.  Her symptoms are modest and hard to discern between her various comorbidities.  Discussed also the fact that her weight puts  her on the borderline between higher and lower doses of apixaban.  Given the fact that she has had no bleeding, she and her daughter are inclined towards maintaining her on the 5 mg dose at this time.  I think that is reasonable  We will need to continue to be attentive to orthostatic hypotension.        Current medicines are reviewed at length with the patient today .  The patient does not  have concerns regarding medicines.

## 2019-07-30 NOTE — Patient Instructions (Addendum)
Medication Instructions:  Your physician has recommended you make the following change in your medication:   Stop taking your Digoxin  Labwork: None ordered.  Testing/Procedures: Echo in 5 months Your physician has requested that you have an echocardiogram. Echocardiography is a painless test that uses sound waves to create images of your heart. It provides your doctor with information about the size and shape of your heart and how well your heart's chambers and valves are working. This procedure takes approximately one hour. There are no restrictions for this procedure.    Follow-Up: Your physician wants you to follow-up in: 6 months with Dr Caryl Comes. You will receive a reminder letter in the mail two months in advance. If you don't receive a letter, please call our office to schedule the follow-up appointment.  Remote monitoring is used to monitor your Pacemaker of ICD from home. This monitoring reduces the number of office visits required to check your device to one time per year. It allows [place] to keep an eye on the functioning of your device to ensure it is working properly.   Any Other Special Instructions Will Be Listed Below (If Applicable).  Provided pt, PT information via phone call for [facility], Sherri Rad [phone].  If you need a refill on your cardiac medications before your next appointment, please call your pharmacy.

## 2019-07-31 ENCOUNTER — Telehealth: Payer: Self-pay | Admitting: Internal Medicine

## 2019-07-31 ENCOUNTER — Telehealth: Payer: Self-pay

## 2019-07-31 ENCOUNTER — Encounter: Payer: Self-pay | Admitting: Family Medicine

## 2019-07-31 ENCOUNTER — Ambulatory Visit (INDEPENDENT_AMBULATORY_CARE_PROVIDER_SITE_OTHER): Payer: Medicare Other | Admitting: Family Medicine

## 2019-07-31 VITALS — BP 170/80 | HR 77 | Temp 98.2°F | Ht 63.0 in | Wt 143.5 lb

## 2019-07-31 DIAGNOSIS — I482 Chronic atrial fibrillation, unspecified: Secondary | ICD-10-CM

## 2019-07-31 DIAGNOSIS — E039 Hypothyroidism, unspecified: Secondary | ICD-10-CM

## 2019-07-31 DIAGNOSIS — I10 Essential (primary) hypertension: Secondary | ICD-10-CM

## 2019-07-31 DIAGNOSIS — F0390 Unspecified dementia without behavioral disturbance: Secondary | ICD-10-CM | POA: Diagnosis not present

## 2019-07-31 DIAGNOSIS — M7502 Adhesive capsulitis of left shoulder: Secondary | ICD-10-CM

## 2019-07-31 LAB — BASIC METABOLIC PANEL
BUN: 19 mg/dL (ref 6–23)
CO2: 29 mEq/L (ref 19–32)
Calcium: 9.5 mg/dL (ref 8.4–10.5)
Chloride: 103 mEq/L (ref 96–112)
Creatinine, Ser: 0.99 mg/dL (ref 0.40–1.20)
GFR: 53.09 mL/min — ABNORMAL LOW (ref >60.00)
Glucose, Bld: 97 mg/dL (ref 70–99)
Potassium: 4.5 mEq/L (ref 3.5–5.1)
Sodium: 138 mEq/L (ref 135–145)

## 2019-07-31 LAB — T4, FREE: Free T4: 1.91 ng/dL — ABNORMAL HIGH (ref 0.60–1.60)

## 2019-07-31 LAB — TSH: TSH: 0.02 u[IU]/mL — ABNORMAL LOW (ref 0.35–4.50)

## 2019-07-31 NOTE — Assessment & Plan Note (Signed)
Reviewed neurology evaluation and explained that I agree based on assessment that she should not drive. Appreciate neurology support. Per eval - alzheimer's and mixed vascular dementia - she should not make major medical decisions or financial. Discussed that medication can be helpful and advised reaching out to Dr. Karolee Ohs (current f/u 11/2019) to see if they could be seen sooner. Would be willing to start Aricept if unable to get in with Dr. Karolee Ohs. Will also refer to clinical pharmacist to see if more could be done to simplify her medications including blister packs.

## 2019-07-31 NOTE — Patient Instructions (Addendum)
Your blood pressure high.   High blood pressure increases your risk for heart attack and stroke.   Please check your blood pressure 2-4 times a week.   To check your blood pressure 1) Sit in a quiet and relaxed place for 5 minutes 2) Make sure your feet are flat on the ground 3) Consider checking first thing in the morning   Normal blood pressure is less than 140/90 Ideally you blood pressure should be around 120/80  Call in 1 week with home blood pressure   Thyroid medication -- Check to see what dose of Levothyroxine you are taking  - Labs today

## 2019-07-31 NOTE — Telephone Encounter (Signed)
Spoke with pt and pt's daughter and advised PT, Tracie Harrier has an opening for 08/02/2019 at 845am.  Pt states she would be unable to make that appointment.  Pt has PT's office contact information and she and daughter will contact her office and schedule an appointment that is convenient for them.  Pt thanked Therapist, sports for call.

## 2019-07-31 NOTE — Telephone Encounter (Signed)
Daughter of the patient called. The patient was told to contact a PT for her shoulder and the PT would need a referral from Dr. Caryl Comes. Dr. Caryl Comes wanted the patient to see Joelyn Oms at the [facility]. Please send a referral so that the patient can begin treatment

## 2019-07-31 NOTE — Telephone Encounter (Signed)
Referral placed for Outpatient physical therapy per Dr Caryl Comes for Capsulitis of left shoulder.  Pt referred to [name], PT.

## 2019-07-31 NOTE — Progress Notes (Signed)
Subjective:     [name] is a 86 y.o. female presenting for Memory Loss     HPI  [name] is here to follow-up on neurology evaluation with her daughter  Has 3 pills - not sure what they are  Cardiology - stopped digoxin - but has pacemaker  HTN - does not check bp regularly at home -   Review of Systems  Chart review 06/12/2019: Clinic - LLE - normal [place]. epigastric pain - suspect muscle 06/19/2019: Neurology - SLUMs 17/30. Labs and CT head 07/18/2019: Neurocog eval - mixed neurodegenerative and vascular dementia, alzheimer's anosognsia -- not capable of high level affairs - medical/financial  Social History   Tobacco Use  Smoking Status Former Smoker  . Packs/day: 0.25  . Years: 10.00  . Pack years: 2.50  . Types: Cigarettes  . Quit date: 60  . Years since quitting: 41.3  Smokeless Tobacco Never Used        Objective:    BP Readings from Last 3 Encounters:  07/31/19 (!) 170/80  07/30/19 (!) 168/98  06/19/19 135/72   Wt Readings from Last 3 Encounters:  07/31/19 143 lb 8 oz (65.1 kg)  07/30/19 142 lb (64.4 kg)  06/19/19 153 lb 3.2 oz (69.5 kg)    BP (!) 170/80 (BP Location: Left Arm, Patient Position: Sitting, Cuff Size: Normal)   Pulse 77   Temp 98.2 F (36.8 C) (Other (Comment))   Ht 5\' 3"  (1.6 m)   Wt 143 lb 8 oz (65.1 kg)   SpO2 96%   BMI 25.42 kg/m    Physical Exam Constitutional:      General: She is not in acute distress.    Appearance: She is well-developed. She is not diaphoretic.  HENT:     Right Ear: External ear normal.     Left Ear: External ear normal.     Nose: Nose normal.  Eyes:     Conjunctiva/sclera: Conjunctivae normal.  Cardiovascular:     Rate and Rhythm: Normal rate and regular rhythm.     Heart sounds: No murmur.  Pulmonary:     Effort: Pulmonary effort is normal. No respiratory distress.     Breath sounds: Normal breath sounds. No wheezing.  Musculoskeletal:     Cervical back: Neck supple.   Comments: varicose veins b/l LE. No edema  Skin:    General: Skin is warm and dry.     Capillary Refill: Capillary refill takes less than 2 seconds.  Neurological:     Mental Status: She is alert. Mental status is at baseline.  Psychiatric:        Mood and Affect: Mood normal.        Behavior: Behavior normal.           Assessment & Plan:   Problem List Items Addressed This Visit      Cardiovascular and Mediastinum   Chronic a-fib ([place]) - Primary (Chronic)    Reviewed cardiology visit - just stopping digoxin. She has 2 unidentified pills today - they are both digoxin. Instructed not to take these pills. Appreciate cardiology support.       Relevant Orders   Ambulatory referral to Chronic Care Management Services   Malignant hypertension    BP elevated, however, patient also with hx of orthostatic hypotension. Advised some home monitoring of BP. Continue lisinopril as switching from amlodipine seemed to improve LE swelling. Will likely anticipate some permissive HTN given hx of falls      Relevant  Orders   Basic metabolic panel   Ambulatory referral to Chronic Care Management Services     Endocrine   Hypothyroidism    Last TSH was low and dose decreased. Will repeat today. Unclear if she is taking medication, notes a "red" pill which may be consistent with Levothyroxine 100 mcg. Has one she is not taking with her today (not labeled) but seems to be levothyroxine 125 mcg. Advised her to continue not taking this medication. Labs today and adjust as needed.       Relevant Orders   TSH   T4, free   Ambulatory referral to Chronic Care Management Services     Nervous and Auditory   Dementia without behavioral disturbance [facility])    Reviewed neurology evaluation and explained that I agree based on assessment that she should not drive. Appreciate neurology support. Per eval - alzheimer's and mixed vascular dementia - she should not make major medical decisions or financial.  Discussed that medication can be helpful and advised reaching out to Dr. Karolee Ohs (current f/u 11/2019) to see if they could be seen sooner. Would be willing to start Aricept if unable to get in with Dr. Karolee Ohs. Will also refer to clinical pharmacist to see if more could be done to simplify her medications including blister packs.        Relevant Orders   Ambulatory referral to Chronic Care Management Services       Return in about 3 months (around 10/31/2019).  Jennifer Noe, MD

## 2019-07-31 NOTE — Assessment & Plan Note (Addendum)
Last TSH was low and dose decreased. Will repeat today. Unclear if she is taking medication, notes a "red" pill which may be consistent with Levothyroxine 100 mcg. Has one she is not taking with her today (not labeled) but seems to be levothyroxine 125 mcg. Advised her to continue not taking this medication. Labs today and adjust as needed.

## 2019-07-31 NOTE — Assessment & Plan Note (Addendum)
BP elevated, however, patient also with hx of orthostatic hypotension. Advised some home monitoring of BP. Continue lisinopril as switching from amlodipine seemed to improve LE swelling. Will likely anticipate some permissive HTN given hx of falls

## 2019-07-31 NOTE — Assessment & Plan Note (Signed)
Reviewed cardiology visit - just stopping digoxin. She has 2 unidentified pills today - they are both digoxin. Instructed not to take these pills. Appreciate cardiology support.

## 2019-08-01 ENCOUNTER — Telehealth: Payer: Self-pay

## 2019-08-01 DIAGNOSIS — E039 Hypothyroidism, unspecified: Secondary | ICD-10-CM

## 2019-08-01 NOTE — Telephone Encounter (Signed)
Patient advised. Lab appointment for 09/17/19

## 2019-08-01 NOTE — Telephone Encounter (Signed)
Appreciate the update.   Will need repeat TSH in 6 weeks. Can be lab appointment.

## 2019-08-01 NOTE — Telephone Encounter (Signed)
Spoke with [facility] pharmacist and was advised that patient did pick up Levothyroxine 100 mg in April. Pharmacist said the color if the pills is light yellow. I called patient and asked her to look at her bottles again and look at the color and she verified that they were light yellow. Advised patient that she was taking the right mg but found out she was not taking them on empty stomach but with breakfast. Advised patient to write down instructions on how to take this medication: take it on empty stomach in the morning 30 minutes before breakfast with a full glass of water every day. Patient wrote down the instructions and verbalized understanding.

## 2019-08-02 ENCOUNTER — Ambulatory Visit: Payer: Medicare Other | Attending: Internal Medicine | Admitting: Physical Therapy

## 2019-08-02 ENCOUNTER — Other Ambulatory Visit: Payer: Self-pay

## 2019-08-02 ENCOUNTER — Ambulatory Visit: Payer: Medicare Other | Admitting: Physical Therapy

## 2019-08-02 ENCOUNTER — Encounter: Payer: Self-pay | Admitting: Physical Therapy

## 2019-08-02 DIAGNOSIS — M542 Cervicalgia: Secondary | ICD-10-CM

## 2019-08-02 DIAGNOSIS — R293 Abnormal posture: Secondary | ICD-10-CM

## 2019-08-02 DIAGNOSIS — M25512 Pain in left shoulder: Secondary | ICD-10-CM | POA: Insufficient documentation

## 2019-08-02 DIAGNOSIS — G8929 Other chronic pain: Secondary | ICD-10-CM

## 2019-08-02 DIAGNOSIS — M25612 Stiffness of left shoulder, not elsewhere classified: Secondary | ICD-10-CM | POA: Diagnosis not present

## 2019-08-02 DIAGNOSIS — M6281 Muscle weakness (generalized): Secondary | ICD-10-CM

## 2019-08-02 NOTE — Therapy (Signed)
[place], [place], [address] Phone: [phone]   Fax:  [phone]  Physical Therapy Evaluation  Patient Details  Name: [name] MRN: [id number] Date of Birth: [birth year] Referring Provider (PT): Jolyn Nap MD   Encounter Date: 08/02/2019  PT End of Session - 08/02/19 1145    Visit Number  1    Number of Visits  9    Date for PT Re-Evaluation  10/04/19    Authorization Type  BCBS  Medicare   Progress note on 10th visit    PT Start Time  1100    PT Stop Time  1200    PT Time Calculation (min)  60 min    Activity Tolerance  Patient tolerated treatment well    Behavior During Therapy  [facility] for tasks assessed/performed       Past Medical History:  Diagnosis Date  . Arrhythmia    chronic atrial fib  . Atrial fibrillation, chronic ([place])   . Chronic anticoagulation   . Chronic atrial fibrillation ([place]) 01/28/2017  . Chronic venous insufficiency 01/28/2017  . Clavicle fracture 11/20/2017  . Complication of anesthesia    " difficult waking "  . Long term current use of anticoagulant therapy 01/28/2017  . Osteoarthritis   . Presence of permanent cardiac pacemaker 01/28/2017   Original implant 1992 [employer] 431-04 Generator change 2001 and 2011.  Medtronic generator.   . SSS (sick sinus syndrome) ([place])   . Thyroid disease    hypothyroidism  . TIA (transient ischemic attack)     Past Surgical History:  Procedure Laterality Date  . ABDOMINAL HYSTERECTOMY    . INSERT / REPLACE / REMOVE PACEMAKER     generator change 2011 medtronic sigma  . KNEE SURGERY    . LEAD INSERTION N/A 03/05/2019   Procedure: LEAD INSERTION - RV LEAD;  Surgeon: Deboraha Sprang, MD;  Location: [place] [facility];  Service: Cardiovascular;  Laterality: N/A;  . PACEMAKER IMPLANT N/A 03/05/2019   Procedure: PACEMAKER IMPLANT;  Surgeon: Deboraha Sprang, MD;  Location: [place] [facility];  Service: Cardiovascular;   Laterality: N/A;  . PACEMAKER INSERTION    . PARS PLANA VITRECTOMY Right 07/19/2018   Procedure: VITRECTOMY WITH VITREOUS BIPOSY & ENDOLASER;  Surgeon: Jalene Mullet, MD;  Location: [place];  Service: Ophthalmology;  Laterality: Right;  . PPM GENERATOR CHANGEOUT N/A 02/02/2017   Procedure: PPM GENERATOR CHANGEOUT;  Surgeon: Deboraha Sprang, MD;  Location: [place] [facility];  Service: Cardiovascular;  Laterality: N/A;  . REPLACEMENT TOTAL KNEE BILATERAL    . VARICOSE VEIN SURGERY      There were no vitals filed for this visit.   Subjective Assessment - 08/02/19 1115    Subjective  I fell and broke my collar bone a few years ago .  My LT shoulder hurts more than my right . I didnt move my arm after my pacemaker was replaced and then I have pain and cant move it as well  Cant sleep on my LT arm    Patient is accompained by:  --   caregiver  [name]   Pertinent History  Bil TKR and  clavicle Fx on LT bil carpal tunnel  PACEMAKER, a fib, OA  See medical chart    Limitations  Lifting    Patient Stated Goals  I want to be able to move my arm without pain    Currently in Pain?  Yes  Pain Score  6     Pain Location  Shoulder   LT most painful   Pain Orientation  Right;Left    Pain Descriptors / Indicators  Aching;Sore;Tightness    Pain Type  Chronic pain    Pain Onset  More than a month ago    Pain Frequency  Intermittent    Aggravating Factors   moving ,  cant sleep on LT side to sleep,  hard to dress and cant get arm behind me         [facility] PT Assessment - 08/02/19 0001      Assessment   Medical Diagnosis  adhesive capsulits LT > RT    Referring Provider (PT)  Jolyn Nap MD    Onset Date/Surgical Date  02/23/19   pace maker replaced Rt to LT chest    Hand Dominance  Right    Prior Therapy  none for arms  but had PT at this clinic and pt cant remember      Precautions   Precautions  None;ICD/Pacemaker      Restrictions   Weight Bearing Restrictions  No      Balance  Screen   Has the patient fallen in the past 6 months  No    Has the patient had a decrease in activity level because of a fear of falling?   No    Is the patient reluctant to leave their home because of a fear of falling?   No      Home Environment   Living Environment  Private residence    Living Arrangements  Other (Comment)    Available Help at Discharge  --   caregivers and family available   Type of [place] to enter    Entrance Stairs-Number of Steps  2    Entrance Stairs-Rails  Right    Home Layout  Two level      Prior Function   Level of Independence  Independent with household mobility with device;Requires assistive device for independence      Observation/Other Assessments   Focus on Therapeutic Outcomes (FOTO)   Intake 40% limtation 60%  predicted 42%      Posture/Postural Control   Posture/Postural Control  Postural limitations    Postural Limitations  Rounded Shoulders;Forward head;Increased thoracic kyphosis;Posterior pelvic tilt;Flexed trunk    Posture Comments  elevated LT shld  kyphosis  RT clavicular fx healed noted ant postiton      ROM / Strength   AROM / PROM / Strength  AROM;PROM;Strength      AROM   Overall AROM   Deficits    Right Shoulder Flexion  100 Degrees    Right Shoulder ABduction  70 Degrees    Right Shoulder Internal Rotation  60 Degrees    Right Shoulder External Rotation  51 Degrees    Left Shoulder Flexion  104 Degrees    Left Shoulder ABduction  65 Degrees    Left Shoulder Internal Rotation  20 Degrees    Left Shoulder External Rotation  70 Degrees    Cervical Flexion  --   pt with very forward head/kyphosis   Cervical - Right Rotation  25    Cervical - Left Rotation  33      PROM   Overall PROM   Deficits    Right Shoulder Flexion  110 Degrees    Left Shoulder Flexion  106 Degrees   pain  Strength   Overall Strength  Deficits    Right Shoulder Flexion  3-/5    Right Shoulder ABduction  3-/5     Left Shoulder Flexion  3-/5    Left Shoulder ABduction  3-/5      Palpation   Palpation comment  Pt with TTP over anterior shld biceps tendon.  posterior capsule tightness and globally painful over RTC muscle       Ambulation/Gait   Ambulation Distance (Feet)  200 Feet    Assistive device  Rollator    Gait Pattern  Step-to pattern;Decreased stride length;Decreased hip/knee flexion - right;Decreased hip/knee flexion - left    Ambulation Surface  Level                  Objective measurements completed on examination: See above findings.              PT Education - 08/02/19 1330    Education Details  POC Explanation of findings  HEP, education of adhesive capsulitis, initail posture    Person(s) Educated  Patient;Caregiver(s)   [id number]   Methods  Explanation;Demonstration;Tactile cues;Verbal cues;Handout    Comprehension  Verbalized understanding;Returned demonstration      EDcuated caregiver with patient on all exericise below   Access Code: [email or web address]: [email or web address]: 05/13/2021Prepared by: Donnetta Simpers BeardsleyExercises  Circular Shoulder Pendulum with Table Support - 3 x daily - 7 x weekly - 10 reps - 2 sets  Flexion-Extension Shoulder Pendulum with Table Support - 3 x daily - 7 x weekly - 10 reps - 2 sets  Horizontal Shoulder Pendulum with Table Support - 3 x daily - 7 x weekly - 10 reps - 2 sets  Supine Shoulder Flexion Extension AAROM with Dowel - 3 x daily - 7 x weekly - 10 reps - 1 sets  Supine Shoulder External Rotation in 45 Degrees Abduction AAROM with Dowel - 3 x daily - 7 x weekly - 10 reps - 1 sets  Supine Shoulder Protraction with Dowel - 3 x daily - 7 x weekly - 10 reps - 1 sets  Supine Shoulder Abduction AAROM with Dowel - 3 x daily - 7 x weekly - 10 reps - 1 sets  Standing Shoulder Posterior Capsule Stretch - 3 x daily - 7 x weekly - 10 reps - 1 sets  Standing Shoulder Flexion Wall Walk - 3 x daily - 7 x weekly - 10 reps -  1 sets - 5 hold  Standing Shoulder Scaption Wall Walk - 3 x daily - 7 x weekly - 10 reps - 1 sets - 5 hold Patient Education  Frozen Shoulder   PT Short Term Goals - 08/02/19 1334      PT SHORT TERM GOAL #1   Title  Pt/caregiver with be able to demo proper execution of  initial HEP    Time  3    Period  Weeks    Status  New    Target Date  08/23/19      PT SHORT TERM GOAL #2   Title  Pt /caregiver will be educated on FOTO report    Period  Weeks    Status  New    Target Date  08/23/19      PT SHORT TERM GOAL #3   Title  Pt will be able to lift UE to 105 without exacerbating pain    Time  3    Period  Weeks    Status  New    Target Date  08/23/19        PT Long Term Goals - 08/02/19 1343      PT LONG TERM GOAL #1   Title  Pt will be able to perform advanced HEP with assistance of caregiver and return demo on proper execution    Time  9    Period  Weeks    Status  New    Target Date  10/04/19      PT LONG TERM GOAL #2   Title  Pain will decrease to 2/10 or less  with all functional activities and ability to sleep on LT shld    Time  9    Period  Weeks    Status  New    Target Date  10/04/19      PT LONG TERM GOAL #3   Title  LT shoulder AROM scaption will improve to 0-120 degrees for improved overhead reaching and comfort    Baseline  RT 100, LT 104    Time  9    Period  Weeks    Status  New    Target Date  10/04/19      PT LONG TERM GOAL #4   Title  FOTO will improve from 60% limitation   to  42% limitation   indicating improved functional mobility.    Baseline  eval 60% limitation    Time  9    Period  Weeks    Status  New    Target Date  10/04/19      PT LONG TERM GOAL #5   Title  LT shoulder IR and ER will return to [facility] to return to pain-free ADLs such as dressing and grooming.    Time  9    Period  Weeks    Status  New    Target Date  10/04/19             Plan - 08/02/19 1336    Clinical Impression Statement  [name] presents  with signs and symptoms compatible LT adhesive capsulitis and RT shld tightness.  She enters clinic with rollator and kyphotic posture.  she has history of RT clavicular fx and she is RT hand dominant.  Pt had a priod of immobility after last PACEMAKER placement in Demcember 2020 and has noticed increased tightness and pain in LT shld. . Pt kyphotic posture and lack of movement at home contributes to immobility.  Education on importance of taking movement " snacks " during the day will prevent other problems associated with immobilty.   Pt presents with impairments including pain, limited ROM, and weakness, and as a result, pt is limited with ADLs and functional activities. Pt would benefit from skilled outpatient PT services for 1 times a week for 9 weeks to progress toward pain-free PLOF for availible AROM    Personal Factors and Comorbidities  Age;Comorbidity 1;Comorbidity 2    Examination-Activity Limitations  Carry;Sleep;Lift;Dressing    Clinical Decision Making  Moderate    Rehab Potential  Good    PT Frequency  1x / week    PT Duration  Other (comment)   9 weeks   PT Treatment/Interventions  ADLs/Self Care Home Management;Cryotherapy;Moist Heat;Ultrasound;Iontophoresis 4mg /ml Dexamethasone;Therapeutic exercise;Therapeutic activities;Neuromuscular re-education;Patient/family education;Passive range of motion;Manual techniques;Dry needling;Taping;Joint Manipulations    PT Next Visit Plan  [place] HEP .  Manual  possible TPDN  education for caregiven    PT Home Exercise Plan  [email or web address]    Consulted and Agree with Plan of Care  Patient  Patient will benefit from skilled therapeutic intervention in order to improve the following deficits and impairments:  Decreased range of motion, Decreased strength, Hypomobility, Increased fascial restricitons, Increased muscle spasms, Impaired flexibility, Postural dysfunction, Improper body mechanics, Pain, Impaired UE functional use  Visit  Diagnosis: Chronic left shoulder pain  Stiffness of left shoulder, not elsewhere classified  Cervicalgia  Muscle weakness (generalized)  Abnormal posture     Problem List Patient Active Problem List   Diagnosis Date Noted  . Dementia without behavioral disturbance ([place]) 07/31/2019  . Sick sinus syndrome ([place]) 07/28/2019  . Epigastric pain 06/12/2019  . Localized swelling of left lower leg 04/11/2019  . Memory loss 04/11/2019  . Prediabetes 03/14/2019  . Complete heart block ([place]) 03/05/2019  . TIA (transient ischemic attack) 01/15/2019  . Gait abnormality 01/15/2019  . Transient ischemia 12/13/2018  . Dysarthria 12/11/2018  . Hypothyroidism 12/11/2018  . Arthritis of right hand 08/17/2018  . Chronic back pain 04/11/2018  . Encounter for therapeutic drug monitoring 03/09/2018  . Malignant hypertension 01/04/2018  . Clavicle fracture 11/20/2017  . Closed fracture of clavicle 11/20/2017  . Fall   . Accelerated hypertension   . Carpal tunnel syndrome of left wrist 08/02/2017  . Carpal tunnel syndrome of right wrist 08/02/2017  . Bradycardia 02/02/2017  . Presence of permanent cardiac pacemaker 01/28/2017  . Chronic a-fib ([place]) 01/28/2017  . Long term current use of anticoagulant therapy 01/28/2017  . Chronic venous insufficiency 01/28/2017  . Chronic atrial fibrillation ([place]) 01/28/2017  . Peripheral venous insufficiency 01/28/2017  . Cardiac pacemaker in situ 01/28/2017  . Anticoagulated 01/28/2017  . DOE (dyspnea on exertion) 09/22/2015  . Dyspnea on exertion 09/22/2015  . 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 05/15/2014  . After cataract of left eye not obscuring vision 05/15/2014  . Bilateral dry eyes 05/15/2014  . Dermatochalasis of eyelid 05/15/2014  . Hyperopia of both eyes with astigmatism and presbyopia 05/15/2014  . Irregular astigmatism of right eye 05/15/2014  . Mechanical complication due to intraocular lens implant 05/15/2014  . 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 Vitreous syneresis of both eyes 05/15/2014    [name], PT Certified Exercise Expert for the Aging Adult  08/02/19 1:58 PM Phone: [phone] Fax: [facility] [address] [place], [place], [address] Phone: [phone]   Fax:  [phone]  Name: [name] MRN: [id number] Date of Birth: [birth year]

## 2019-08-02 NOTE — Patient Instructions (Signed)
       [name], PT Certified Exercise Expert for the Aging Adult  08/02/19 11:45 AM Phone: [phone] Fax: [phone]

## 2019-08-07 LAB — CUP PACEART INCLINIC DEVICE CHECK
Battery Remaining Longevity: 152 mo
Battery Voltage: 3.19 V
Brady Statistic RV Percent Paced: 99.76 %
Date Time Interrogation Session: 20210510134505
Implantable Lead Implant Date: 20201214
Implantable Lead Location: 753860
Implantable Lead Model: 1948
Implantable Pulse Generator Implant Date: 20201214
Lead Channel Impedance Value: 456 Ohm
Lead Channel Impedance Value: 456 Ohm
Lead Channel Impedance Value: 456 Ohm
Lead Channel Impedance Value: 665 Ohm
Lead Channel Impedance Value: 665 Ohm
Lead Channel Impedance Value: 665 Ohm
Lead Channel Pacing Threshold Amplitude: 0.75 V
Lead Channel Pacing Threshold Amplitude: 0.75 V
Lead Channel Pacing Threshold Amplitude: 0.75 V
Lead Channel Pacing Threshold Pulse Width: 0.4 ms
Lead Channel Pacing Threshold Pulse Width: 0.4 ms
Lead Channel Pacing Threshold Pulse Width: 0.4 ms
Lead Channel Sensing Intrinsic Amplitude: 5.75 mV
Lead Channel Sensing Intrinsic Amplitude: 6 mV
Lead Channel Sensing Intrinsic Amplitude: 6 mV
Lead Channel Sensing Intrinsic Amplitude: 6 mV
Lead Channel Setting Pacing Amplitude: 2.5 V
Lead Channel Setting Pacing Pulse Width: 0.4 ms
Lead Channel Setting Sensing Sensitivity: 1.2 mV

## 2019-08-09 ENCOUNTER — Other Ambulatory Visit: Payer: Self-pay | Admitting: Family Medicine

## 2019-08-14 ENCOUNTER — Other Ambulatory Visit: Payer: Self-pay

## 2019-08-14 ENCOUNTER — Other Ambulatory Visit (INDEPENDENT_AMBULATORY_CARE_PROVIDER_SITE_OTHER): Payer: Medicare Other

## 2019-08-14 DIAGNOSIS — E039 Hypothyroidism, unspecified: Secondary | ICD-10-CM | POA: Diagnosis not present

## 2019-08-14 LAB — TSH: TSH: 0.12 u[IU]/mL — ABNORMAL LOW (ref 0.35–4.50)

## 2019-08-15 ENCOUNTER — Other Ambulatory Visit: Payer: Self-pay | Admitting: Primary Care

## 2019-08-15 ENCOUNTER — Telehealth: Payer: Self-pay | Admitting: Family Medicine

## 2019-08-15 DIAGNOSIS — E039 Hypothyroidism, unspecified: Secondary | ICD-10-CM

## 2019-08-15 MED ORDER — LEVOTHYROXINE SODIUM 88 MCG PO TABS: ORAL_TABLET | ORAL | 0 refills | Status: DC

## 2019-08-15 NOTE — Telephone Encounter (Signed)
Patient called back. See notes on last lab report.  Patient said she is taking 100 mcg. Patient scheduled her lab appointment on 10/03/19.

## 2019-08-15 NOTE — Telephone Encounter (Signed)
Result note updated.   Nothing further needed.

## 2019-08-16 ENCOUNTER — Ambulatory Visit: Payer: Medicare Other | Admitting: Physical Therapy

## 2019-08-16 ENCOUNTER — Encounter: Payer: Self-pay | Admitting: Physical Therapy

## 2019-08-16 ENCOUNTER — Other Ambulatory Visit: Payer: Self-pay

## 2019-08-16 ENCOUNTER — Telehealth: Payer: Self-pay

## 2019-08-16 DIAGNOSIS — M6281 Muscle weakness (generalized): Secondary | ICD-10-CM | POA: Diagnosis not present

## 2019-08-16 DIAGNOSIS — G8929 Other chronic pain: Secondary | ICD-10-CM | POA: Diagnosis not present

## 2019-08-16 DIAGNOSIS — R293 Abnormal posture: Secondary | ICD-10-CM | POA: Diagnosis not present

## 2019-08-16 DIAGNOSIS — M25612 Stiffness of left shoulder, not elsewhere classified: Secondary | ICD-10-CM

## 2019-08-16 DIAGNOSIS — M542 Cervicalgia: Secondary | ICD-10-CM

## 2019-08-16 DIAGNOSIS — M25512 Pain in left shoulder: Secondary | ICD-10-CM | POA: Diagnosis not present

## 2019-08-16 NOTE — Patient Instructions (Addendum)
   You can use pillow cases over hands and do both hands at one time and then Left then Right . This stretches your arms and your upper back. That is important for stretching important back muscles and standing taller.    Every day sit to stand from sink into chair, 5-10x  3 x a day Walk up to 30 minutes a day and take movement snacks . Try to swing arms while walking with aide  Trigger Point Dry Needling  . What is Trigger Point Dry Needling (DN)? o DN is a physical therapy technique used to treat muscle pain and dysfunction. Specifically, DN helps deactivate muscle trigger points (muscle knots).  o A thin filiform needle is used to penetrate the skin and stimulate the underlying trigger point. The goal is for a local twitch response (LTR) to occur and for the trigger point to relax. No medication of any kind is injected during the procedure.   . What Does Trigger Point Dry Needling Feel Like?  o The procedure feels different for each individual patient. Some patients report that they do not actually feel the needle enter the skin and overall the process is not painful. Very mild bleeding may occur. However, many patients feel a deep cramping in the muscle in which the needle was inserted. This is the local twitch response.   Marland Kitchen How Will I feel after the treatment? o Soreness is normal, and the onset of soreness may not occur for a few hours. Typically this soreness does not last longer than two days.  o Bruising is uncommon, however; ice can be used to decrease any possible bruising.  o In rare cases feeling tired or nauseous after the treatment is normal. In addition, your symptoms may get worse before they get better, this period will typically not last longer than 24 hours.   . What Can I do After My Treatment? o Increase your hydration by drinking more water for the next 24 hours. o You may place ice or heat on the areas treated that have become sore, however, do not use heat on inflamed  or bruised areas. Heat often brings more relief post needling. o You can continue your regular activities, but vigorous activity is not recommended initially after the treatment for 24 hours. o DN is best combined with other physical therapy such as strengthening, stretching, and other therapies.    [name], PT Certified Exercise Expert for the Aging Adult  08/16/19 3:01 PM Phone: [phone] Fax: [phone]

## 2019-08-16 NOTE — Telephone Encounter (Signed)
Pt called asking about her medication list and what she takes. I went through her med list. She said she does not have anyone to help her with her meds.

## 2019-08-16 NOTE — Therapy (Signed)
[place], [place], [address] Phone: [phone]   Fax:  [phone]  Physical Therapy Treatment  Patient Details  Name: [name] MRN: [id number] Date of Birth: Apr 19, 1930 Referring Provider (PT): Jolyn Nap MD   Encounter Date: 08/16/2019  PT End of Session - 08/16/19 1502    Visit Number  2    Number of Visits  9    Date for PT Re-Evaluation  10/04/19    Authorization Type  BCBS  Medicare   Progress note on 10th visit    PT Start Time  1145    PT Stop Time  1246    PT Time Calculation (min)  61 min    Activity Tolerance  Patient tolerated treatment well    Behavior During Therapy  [facility] for tasks assessed/performed       Past Medical History:  Diagnosis Date  . Arrhythmia    chronic atrial fib  . Atrial fibrillation, chronic ([place])   . Chronic anticoagulation   . Chronic atrial fibrillation ([place]) 01/28/2017  . Chronic venous insufficiency 01/28/2017  . Clavicle fracture 11/20/2017  . Complication of anesthesia    " difficult waking "  . Long term current use of anticoagulant therapy 01/28/2017  . Osteoarthritis   . Presence of permanent cardiac pacemaker 01/28/2017   Original implant 1992 Lead [employer]medics 431-04 Generator change 2001 and 2011.  Medtronic generator.   . SSS (sick sinus syndrome) ([place])   . Thyroid disease    hypothyroidism  . TIA (transient ischemic attack)     Past Surgical History:  Procedure Laterality Date  . ABDOMINAL HYSTERECTOMY    . INSERT / REPLACE / REMOVE PACEMAKER     generator change 2011 medtronic sigma  . KNEE SURGERY    . LEAD INSERTION N/A 03/05/2019   Procedure: LEAD INSERTION - RV LEAD;  Surgeon: Deboraha Sprang, MD;  Location: [place] [facility];  Service: Cardiovascular;  Laterality: N/A;  . PACEMAKER IMPLANT N/A 03/05/2019   Procedure: PACEMAKER IMPLANT;  Surgeon: Deboraha Sprang, MD;  Location: [place] [facility];  Service: Cardiovascular;   Laterality: N/A;  . PACEMAKER INSERTION    . PARS PLANA VITRECTOMY Right 07/19/2018   Procedure: VITRECTOMY WITH VITREOUS BIPOSY & ENDOLASER;  Surgeon: Jalene Mullet, MD;  Location: [place];  Service: Ophthalmology;  Laterality: Right;  . PPM GENERATOR CHANGEOUT N/A 02/02/2017   Procedure: PPM GENERATOR CHANGEOUT;  Surgeon: Deboraha Sprang, MD;  Location: [place] [facility];  Service: Cardiovascular;  Laterality: N/A;  . REPLACEMENT TOTAL KNEE BILATERAL    . VARICOSE VEIN SURGERY      There were no vitals filed for this visit.  Subjective Assessment - 08/16/19 1149    Subjective  I feel a bit better in my arm but it still hurts when I raise it.  I am having problems with my legs.    Pertinent History  Bil TKR and  clavicle Fx on LT bil carpal tunnel  PACEMAKER, a fib, OA  See medical chart    Limitations  Lifting    Patient Stated Goals  I want to be able to move my arm without pain    Currently in Pain?  Yes    Pain Score  5     Pain Location  Shoulder    Pain Orientation  Right;Left    Pain Descriptors / Indicators  Aching;Sore    Pain Type  Chronic pain    Pain  Onset  More than a month ago                        [facility] Adult PT Treatment/Exercise - 08/16/19 0001      Shoulder Exercises: Standing   External Rotation  Strengthening;Both;10 reps;Theraband    Theraband Level (Shoulder External Rotation)  Level 2 (Red)    External Rotation Limitations  x2    Internal Rotation  Strengthening;Left;10 reps;Theraband    Theraband Level (Shoulder Internal Rotation)  Level 2 (Red)    Internal Rotation Limitations  x2    Extension  Strengthening;Both;10 reps;Theraband    Theraband Level (Shoulder Extension)  Level 2 (Red)    Extension Limitations  x2    Row  Strengthening;Both;10 reps;Theraband    Theraband Level (Shoulder Row)  Level 2 (Red)    Row Limitations  x2    Other Standing Exercises  wall slides bil and UE for 20 x each with pillow cases on hands for decreased  friction.   walking with VC for arm swing    Other Standing Exercises  bil UE on sink and 2 x 10 sit to stand with chair behind      Modalities   Modalities  Moist Heat      Moist Heat Therapy   Number Minutes Moist Heat  12 Minutes    Moist Heat Location  Shoulder   LT     Manual Therapy   Manual Therapy  Joint mobilization;Soft tissue mobilization    Manual therapy comments  skilled palpation with TPDN    Joint Mobilization  Grade 3/4 jt mob PA inf/post  and LAD of LT shoulder    Soft tissue mobilization  periscapular STW to DN mx indicated in TPDN       Trigger Point Dry Needling - 08/16/19 0001    Consent Given?  Yes    Education Handout Provided  Yes    Muscles Treated Upper Quadrant  Infraspinatus;Subscapularis;Teres major   LT only   Dry Needling Comments  40 mm .25 mm    Infraspinatus Response  Twitch response elicited;Palpable increased muscle length    Subscapularis Response  Twitch response elicited;Palpable increased muscle length    Teres major Response  Twitch response elicited;Palpable increased muscle length           PT Education - 08/16/19 1452    Education Details  Added Standing strengtheninig HEP and discussed walking and taking movement ' snacks"    Person(s) Educated  Patient    Methods  Explanation;Demonstration;Tactile cues;Verbal cues;Handout    Comprehension  Verbalized understanding;Returned demonstration       PT Short Term Goals - 08/02/19 1334      PT SHORT TERM GOAL #1   Title  Pt/caregiver with be able to demo proper execution of  initial HEP    Time  3    Period  Weeks    Status  New    Target Date  08/23/19      PT SHORT TERM GOAL #2   Title  Pt /caregiver will be educated on FOTO report    Period  Weeks    Status  New    Target Date  08/23/19      PT SHORT TERM GOAL #3   Title  Pt will be able to lift UE to 105 without exacerbating pain    Time  3    Period  Weeks    Status  New  Target Date  08/23/19        PT  Long Term Goals - 08/02/19 1343      PT LONG TERM GOAL #1   Title  Pt will be able to perform advanced HEP with assistance of caregiver and return demo on proper execution    Time  9    Period  Weeks    Status  New    Target Date  10/04/19      PT LONG TERM GOAL #2   Title  Pain will decrease to 2/10 or less  with all functional activities and ability to sleep on LT shld    Time  9    Period  Weeks    Status  New    Target Date  10/04/19      PT LONG TERM GOAL #3   Title  LT shoulder AROM scaption will improve to 0-120 degrees for improved overhead reaching and comfort    Baseline  RT 100, LT 104    Time  9    Period  Weeks    Status  New    Target Date  10/04/19      PT LONG TERM GOAL #4   Title  FOTO will improve from 60% limitation   to  42% limitation   indicating improved functional mobility.    Baseline  eval 60% limitation    Time  9    Period  Weeks    Status  New    Target Date  10/04/19      PT LONG TERM GOAL #5   Title  LT shoulder IR and ER will return to [facility] to return to pain-free ADLs such as dressing and grooming.    Time  9    Period  Weeks    Status  New    Target Date  10/04/19         Access Code: [email or web address]: [email or web address]: 05/27/2021Prepared by: Donnetta Simpers BeardsleyExercises  Shoulder External Rotation and Scapular Retraction with Resistance - 2 x daily - 7 x weekly - 10 reps - 3 sets  Scapular Retraction with Resistance - 2 x daily - 7 x weekly - 10 reps - 3 sets  Scapular Retraction with Resistance Advanced - 2 x daily - 7 x weekly - 10 reps - 3 sets  Shoulder Internal Rotation with Resistance - 2 x daily - 7 x weekly - 3 sets - 10 reps     Plan - 08/16/19 1457    Clinical Impression Statement  Ms Ulysse and personal aide presents to clinic with rollator and cane to clinic.  she reports having decreased pain this week in LT arm and is concerned about LE strength.  Pt consents to TPDN and is closely monitored  throughout session.  she is able to bring both UE symmetrically above head in supine. approximately 140 degrees.  will continue POC tomaximized AROM and to painfree motion    Personal Factors and Comorbidities  Age;Comorbidity 1;Comorbidity 2    Examination-Activity Limitations  Carry;Sleep;Lift;Dressing    Stability/Clinical Decision Making  Evolving/Moderate complexity    Clinical Decision Making  Moderate    Rehab Potential  Good    PT Frequency  1x / week    PT Duration  --   9 weeks   PT Treatment/Interventions  ADLs/Self Care Home Management;Cryotherapy;Moist Heat;Ultrasound;Iontophoresis 4mg /ml Dexamethasone;Therapeutic exercise;Therapeutic activities;Neuromuscular re-education;Patient/family education;Passive range of motion;Manual techniques;Dry needling;Taping;Joint Manipulations    PT Next Visit Plan  [place] HEP .  Manual  possible TPDN  education for [place]  [id number] and Agree with Plan of Care  Patient       Patient will benefit from skilled therapeutic intervention in order to improve the following deficits and impairments:  Decreased range of motion, Decreased strength, Hypomobility, Increased fascial restricitons, Increased muscle spasms, Impaired flexibility, Postural dysfunction, Improper body mechanics, Pain, Impaired UE functional use  Visit Diagnosis: Chronic left shoulder pain  Stiffness of left shoulder, not elsewhere classified  Cervicalgia  Muscle weakness (generalized)  Abnormal posture     Problem List Patient Active Problem List   Diagnosis Date Noted  . Dementia without behavioral disturbance ([place]) 07/31/2019  . Sick sinus syndrome ([place]) 07/28/2019  . Epigastric pain 06/12/2019  . Localized swelling of left lower leg 04/11/2019  . Memory loss 04/11/2019  . Prediabetes 03/14/2019  . Complete heart block ([place]) 03/05/2019  . TIA (transient ischemic attack) 01/15/2019  . Gait abnormality 01/15/2019   . Transient ischemia 12/13/2018  . Dysarthria 12/11/2018  . Hypothyroidism 12/11/2018  . Arthritis of right hand 08/17/2018  . Chronic back pain 04/11/2018  . Encounter for therapeutic drug monitoring 03/09/2018  . Malignant hypertension 01/04/2018  . Clavicle fracture 11/20/2017  . Closed fracture of clavicle 11/20/2017  . Fall   . Accelerated hypertension   . Carpal tunnel syndrome of left wrist 08/02/2017  . Carpal tunnel syndrome of right wrist 08/02/2017  . Bradycardia 02/02/2017  . Presence of permanent cardiac pacemaker 01/28/2017  . Chronic a-fib ([place]) 01/28/2017  . Long term current use of anticoagulant therapy 01/28/2017  . Chronic venous insufficiency 01/28/2017  . Chronic atrial fibrillation ([place]) 01/28/2017  . Peripheral venous insufficiency 01/28/2017  . Cardiac pacemaker in situ 01/28/2017  . Anticoagulated 01/28/2017  . DOE (dyspnea on exertion) 09/22/2015  . Dyspnea on exertion 09/22/2015  . 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 05/15/2014  . After cataract of left eye not obscuring vision 05/15/2014  . Bilateral dry eyes 05/15/2014  . Dermatochalasis of eyelid 05/15/2014  . Hyperopia of both eyes with astigmatism and presbyopia 05/15/2014  . Irregular astigmatism of right eye 05/15/2014  . Mechanical complication due to intraocular lens implant 05/15/2014  . 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 Vitreous syneresis of both eyes 05/15/2014    [name], PT Certified Exercise Expert for the Aging Adult  08/16/19 3:12 PM Phone: [phone] Fax: [facility] [facility] [address] [place], [place], [address] Phone: [phone]   Fax:  [phone]  Name: [name] MRN: [id number] Date of Birth: 08/30/1930

## 2019-08-17 ENCOUNTER — Other Ambulatory Visit: Payer: Self-pay | Admitting: Family Medicine

## 2019-08-23 ENCOUNTER — Other Ambulatory Visit: Payer: Self-pay

## 2019-08-23 ENCOUNTER — Ambulatory Visit: Payer: Medicare Other | Attending: Internal Medicine | Admitting: Physical Therapy

## 2019-08-23 DIAGNOSIS — M6281 Muscle weakness (generalized): Secondary | ICD-10-CM | POA: Diagnosis present

## 2019-08-23 DIAGNOSIS — M25612 Stiffness of left shoulder, not elsewhere classified: Secondary | ICD-10-CM | POA: Diagnosis present

## 2019-08-23 DIAGNOSIS — G8929 Other chronic pain: Secondary | ICD-10-CM | POA: Diagnosis present

## 2019-08-23 DIAGNOSIS — M25512 Pain in left shoulder: Secondary | ICD-10-CM | POA: Insufficient documentation

## 2019-08-23 DIAGNOSIS — M542 Cervicalgia: Secondary | ICD-10-CM | POA: Diagnosis present

## 2019-08-23 DIAGNOSIS — R293 Abnormal posture: Secondary | ICD-10-CM | POA: Diagnosis present

## 2019-08-23 NOTE — Patient Instructions (Addendum)
Start out with yellow theraband and work up to red.  It is ok to see shaking,  It shows your muscles are working.  [name]  Over Head Pull: Narrow Grip       On back, knees bent, feet flat, band across thighs, elbows straight but relaxed. Pull hands apart (start). Keeping elbows straight, bring arms up and over head, hands toward floor. Keep pull steady on band. Hold momentarily. Return slowly, keeping pull steady, back to start. Repeat 10 x 2___ times. Band color red______   Side Pull: Double Arm   On back, knees bent, feet flat. Arms perpendicular to body, shoulder level, elbows straight but relaxed. Pull arms out to sides, elbows straight. Resistance band comes across collarbones, hands toward floor. Hold momentarily. Slowly return to starting position. Repeat _10 x 2__ times. Band color __red___   [name]   On back, knees bent, feet flat, left hand on left hip, right hand above left. Pull right arm DIAGONALLY (hip to shoulder) across chest. Bring right arm along head toward floor. Hold momentarily. Slowly return to starting position. Repeat 10 x 2___ times. Do with left arm. Band color _red_____   Shoulder Rotation: Double Arm   On back, knees bent, feet flat, elbows tucked at sides, bent 90, hands palms up. Pull hands apart and down toward floor, keeping elbows near sides. Hold momentarily. Slowly return to starting position. Repeat _10 x 2__ times. Band color _red_____       [name], PT Certified Exercise Expert for the Aging Adult  08/23/19 11:30 AM Phone: [phone] Fax: [phone]

## 2019-08-23 NOTE — Therapy (Signed)
[place], [place], [address] Phone: [phone]   Fax:  [phone]  Physical Therapy Treatment  Patient Details  Name: [name] MRN: [id number] Date of Birth: 05-Dec-1930 Referring Provider (PT): Jolyn Nap MD   Encounter Date: 08/23/2019  PT End of Session - 08/23/19 1115    Visit Number  3    Number of Visits  9    Date for PT Re-Evaluation  10/04/19    Authorization Type  BCBS  Medicare   Progress note on 10th visit    PT Start Time  1100    PT Stop Time  1147    PT Time Calculation (min)  47 min    Activity Tolerance  Patient tolerated treatment well    Behavior During Therapy  [facility] for tasks assessed/performed       Past Medical History:  Diagnosis Date  . Arrhythmia    chronic atrial fib  . Atrial fibrillation, chronic ([place])   . Chronic anticoagulation   . Chronic atrial fibrillation ([place]) 01/28/2017  . Chronic venous insufficiency 01/28/2017  . Clavicle fracture 11/20/2017  . Complication of anesthesia    " difficult waking "  . Long term current use of anticoagulant therapy 01/28/2017  . Osteoarthritis   . Presence of permanent cardiac pacemaker 01/28/2017   Original implant 1992 [employer] 431-04 Generator change 2001 and 2011.  Medtronic generator.   . SSS (sick sinus syndrome) ([place])   . Thyroid disease    hypothyroidism  . TIA (transient ischemic attack)     Past Surgical History:  Procedure Laterality Date  . ABDOMINAL HYSTERECTOMY    . INSERT / REPLACE / REMOVE PACEMAKER     generator change 2011 medtronic sigma  . KNEE SURGERY    . LEAD INSERTION N/A 03/05/2019   Procedure: LEAD INSERTION - RV LEAD;  Surgeon: Deboraha Sprang, MD;  Location: [place] [facility];  Service: Cardiovascular;  Laterality: N/A;  . PACEMAKER IMPLANT N/A 03/05/2019   Procedure: PACEMAKER IMPLANT;  Surgeon: Deboraha Sprang, MD;  Location: [place] [facility];  Service: Cardiovascular;   Laterality: N/A;  . PACEMAKER INSERTION    . PARS PLANA VITRECTOMY Right 07/19/2018   Procedure: VITRECTOMY WITH VITREOUS BIPOSY & ENDOLASER;  Surgeon: Jalene Mullet, MD;  Location: [place];  Service: Ophthalmology;  Laterality: Right;  . PPM GENERATOR CHANGEOUT N/A 02/02/2017   Procedure: PPM GENERATOR CHANGEOUT;  Surgeon: Deboraha Sprang, MD;  Location: [place] [facility];  Service: Cardiovascular;  Laterality: N/A;  . REPLACEMENT TOTAL KNEE BILATERAL    . VARICOSE VEIN SURGERY      There were no vitals filed for this visit.  Subjective Assessment - 08/23/19 1114    Subjective  I feel week today. I try to do my exercises but my back gives me a fit    Patient is accompained by:  --   [id number]   Pertinent History  Bil TKR and  clavicle Fx on LT bil carpal tunnel  PACEMAKER, a fib, OA  See medical chart    Limitations  Lifting    Patient Stated Goals  I want to be able to move my arm without pain    Currently in Pain?  Yes    Pain Score  5     Pain Orientation  Left    Pain Descriptors / Indicators  Aching;Sore    Pain Type  Chronic pain    Pain Onset  More  than a month ago    Pain Frequency  Intermittent         [facility] PT Assessment - 08/23/19 0001      AROM   Left Shoulder Flexion  111 Degrees    Left Shoulder ABduction  96 Degrees      PROM   Left Shoulder Flexion  119 Degrees                    [facility] Adult PT Treatment/Exercise - 08/23/19 0001      Shoulder Exercises: Supine   Other Supine Exercises  supine scapular stabilizers with bil yellow t band  flex, horizontal abd, diagonals and Bi ER  with VC and TC and showing caregiver how to cue      Shoulder Exercises: Standing   Other Standing Exercises  wall slides bil and UE for 20 x each with pillow cases on hands for decreased friction.   walking with VC for arm swing    Other Standing Exercises  standing hip abd and ext bil x 15 each  and then hip pendulums with RT and LT with UE  support for whole body  movement and VC for erect posture      Shoulder Exercises: Stretch   Other Shoulder Stretches  supine shoulder stretch of pectorals with moist heat pack and simultaneous with manual for pectoral stretch in supine and with bil UE propperd with pillows      Modalities   Modalities  Moist Heat      Moist Heat Therapy   Number Minutes Moist Heat  10 Minutes    Moist Heat Location  Shoulder   bil with pec stretch bil     Manual Therapy   Manual Therapy  Joint mobilization;Soft tissue mobilization    Joint Mobilization  Grade 3/4 jt mob PA inf/post  and LAD of LT shoulder  posterior capsule stretch    Soft tissue mobilization  periscapular STW to DN mx indicated in TPDN             PT Education - 08/23/19 1140    Education Details  added supine scapular stabilizers to HEP with red t band.  showed care giver how to do horizontal abduction stretch    Person(s) Educated  Patient;Caregiver(s)    Methods  Explanation;Demonstration;Tactile cues;Verbal cues;Handout    Comprehension  Verbalized understanding;Returned demonstration       PT Short Term Goals - 08/23/19 1207      PT SHORT TERM GOAL #1   Title  Pt/caregiver with be able to demo proper execution of  initial HEP    Time  3    Status  Achieved    Target Date  08/23/19      PT SHORT TERM GOAL #2   Title  Pt /caregiver will be educated on FOTO report    Time  3    Period  Weeks    Status  Achieved    Target Date  08/23/19      PT SHORT TERM GOAL #3   Title  Pt will be able to lift UE to 105 without exacerbating pain    Baseline  Pt AROM flex to 110 Rt    Time  3    Period  Weeks    Status  Achieved    Target Date  08/23/19        PT Long Term Goals - 08/02/19 1343      PT LONG TERM GOAL #1  Title  Pt will be able to perform advanced HEP with assistance of caregiver and return demo on proper execution    Time  9    Period  Weeks    Status  New    Target Date  10/04/19      PT LONG TERM GOAL #2   Title   Pain will decrease to 2/10 or less  with all functional activities and ability to sleep on LT shld    Time  9    Period  Weeks    Status  New    Target Date  10/04/19      PT LONG TERM GOAL #3   Title  LT shoulder AROM scaption will improve to 0-120 degrees for improved overhead reaching and comfort    Baseline  RT 100, LT 104    Time  9    Period  Weeks    Status  New    Target Date  10/04/19      PT LONG TERM GOAL #4   Title  FOTO will improve from 60% limitation   to  42% limitation   indicating improved functional mobility.    Baseline  eval 60% limitation    Time  9    Period  Weeks    Status  New    Target Date  10/04/19      PT LONG TERM GOAL #5   Title  LT shoulder IR and ER will return to [facility] to return to pain-free ADLs such as dressing and grooming.    Time  9    Period  Weeks    Status  New    Target Date  10/04/19            Plan - 08/23/19 1116    Clinical Impression Statement  Ms Virk enters clinic with 5/10 pain in LT shoulder.  Pt has made gains in AROM  of LT shoulder flex 111. PROM 119.  All STG achieved today and pt is ready to move onto more strengthening exeriicses in future visits as well as maximize AROM    Personal Factors and Comorbidities  Age;Comorbidity 1;Comorbidity 2    Examination-Activity Limitations  Carry;Sleep;Lift;Dressing    Stability/Clinical Decision Making  Evolving/Moderate complexity    Clinical Decision Making  Moderate    Rehab Potential  Good    PT Frequency  1x / week    PT Duration  --   9 weeks   PT Treatment/Interventions  ADLs/Self Care Home Management;Cryotherapy;Moist Heat;Ultrasound;Iontophoresis 4mg /ml Dexamethasone;Therapeutic exercise;Therapeutic activities;Neuromuscular re-education;Patient/family education;Passive range of motion;Manual techniques;Dry needling;Taping;Joint Manipulations    PT Next Visit Plan  standing IR and ER on doorway   with red t band    PT Home Exercise Plan  [email or web address]   supine scapular stabilizers with yellow t band flex horizongtal abd, diagonals and bil ER.    Consulted and Agree with Plan of Care  Patient       Patient will benefit from skilled therapeutic intervention in order to improve the following deficits and impairments:  Decreased range of motion, Decreased strength, Hypomobility, Increased fascial restricitons, Increased muscle spasms, Impaired flexibility, Postural dysfunction, Improper body mechanics, Pain, Impaired UE functional use  Visit Diagnosis: Chronic left shoulder pain  Stiffness of left shoulder, not elsewhere classified  Cervicalgia  Muscle weakness (generalized)  Abnormal posture     Problem List Patient Active Problem List   Diagnosis Date Noted  . Dementia without behavioral disturbance ([place]) 07/31/2019  . Sick sinus  syndrome ([place]) 07/28/2019  . Epigastric pain 06/12/2019  . Localized swelling of left lower leg 04/11/2019  . Memory loss 04/11/2019  . Prediabetes 03/14/2019  . Complete heart block ([place]) 03/05/2019  . TIA (transient ischemic attack) 01/15/2019  . Gait abnormality 01/15/2019  . Transient ischemia 12/13/2018  . Dysarthria 12/11/2018  . Hypothyroidism 12/11/2018  . Arthritis of right hand 08/17/2018  . Chronic back pain 04/11/2018  . Encounter for therapeutic drug monitoring 03/09/2018  . Malignant hypertension 01/04/2018  . Clavicle fracture 11/20/2017  . Closed fracture of clavicle 11/20/2017  . Fall   . Accelerated hypertension   . Carpal tunnel syndrome of left wrist 08/02/2017  . Carpal tunnel syndrome of right wrist 08/02/2017  . Bradycardia 02/02/2017  . Presence of permanent cardiac pacemaker 01/28/2017  . Chronic a-fib ([place]) 01/28/2017  . Long term current use of anticoagulant therapy 01/28/2017  . Chronic venous insufficiency 01/28/2017  . Chronic atrial fibrillation ([place]) 01/28/2017  . Peripheral venous insufficiency 01/28/2017  . Cardiac pacemaker in situ 01/28/2017  .  Anticoagulated 01/28/2017  . DOE (dyspnea on exertion) 09/22/2015  . Dyspnea on exertion 09/22/2015  . 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 05/15/2014  . After cataract of left eye not obscuring vision 05/15/2014  . Bilateral dry eyes 05/15/2014  . Dermatochalasis of eyelid 05/15/2014  . Hyperopia of both eyes with astigmatism and presbyopia 05/15/2014  . Irregular astigmatism of right eye 05/15/2014  . Mechanical complication due to intraocular lens implant 05/15/2014  . 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 Vitreous syneresis of both eyes 05/15/2014    [name], PT Certified Exercise Expert for the Aging Adult  08/23/19 12:19 PM Phone: [phone] Fax: [facility] [address] [place], [place], [address] Phone: [phone]   Fax:  [phone]  Name: ARIBEL CHAUSSEE MRN: [id number] Date of Birth: 09-16-1930

## 2019-08-28 NOTE — Telephone Encounter (Signed)
Dr. Einar Pheasant, I am showing this as a historical refill. Please advise.

## 2019-08-29 ENCOUNTER — Other Ambulatory Visit: Payer: Self-pay

## 2019-08-29 ENCOUNTER — Ambulatory Visit: Payer: Medicare Other | Admitting: Physical Therapy

## 2019-08-29 ENCOUNTER — Encounter: Payer: Self-pay | Admitting: Physical Therapy

## 2019-08-29 DIAGNOSIS — M542 Cervicalgia: Secondary | ICD-10-CM

## 2019-08-29 DIAGNOSIS — M25512 Pain in left shoulder: Secondary | ICD-10-CM | POA: Diagnosis not present

## 2019-08-29 DIAGNOSIS — M6281 Muscle weakness (generalized): Secondary | ICD-10-CM

## 2019-08-29 DIAGNOSIS — M25612 Stiffness of left shoulder, not elsewhere classified: Secondary | ICD-10-CM

## 2019-08-29 DIAGNOSIS — R293 Abnormal posture: Secondary | ICD-10-CM

## 2019-08-29 NOTE — Therapy (Signed)
[place], [place], [address] Phone: [phone]   Fax:  [phone]  Physical Therapy Treatment  Patient Details  Name: [name] MRN: [id number] Date of Birth: 06-20-1930 Referring Provider (PT): Jolyn Nap MD   Encounter Date: 08/29/2019  PT End of Session - 08/29/19 1139    Visit Number  4    Number of Visits  9    Date for PT Re-Evaluation  10/04/19    Authorization Type  BCBS  Medicare   Progress note on 10th visit    PT Start Time  1100    PT Stop Time  1150    PT Time Calculation (min)  50 min    Activity Tolerance  Patient tolerated treatment well    Behavior During Therapy  [facility] for tasks assessed/performed       Past Medical History:  Diagnosis Date  . Arrhythmia    chronic atrial fib  . Atrial fibrillation, chronic ([place])   . Chronic anticoagulation   . Chronic atrial fibrillation ([place]) 01/28/2017  . Chronic venous insufficiency 01/28/2017  . Clavicle fracture 11/20/2017  . Complication of anesthesia    " difficult waking "  . Long term current use of anticoagulant therapy 01/28/2017  . Osteoarthritis   . Presence of permanent cardiac pacemaker 01/28/2017   Original implant 1992 [employer] 431-04 Generator change 2001 and 2011.  Medtronic generator.   . SSS (sick sinus syndrome) ([place])   . Thyroid disease    hypothyroidism  . TIA (transient ischemic attack)     Past Surgical History:  Procedure Laterality Date  . ABDOMINAL HYSTERECTOMY    . INSERT / REPLACE / REMOVE PACEMAKER     generator change 2011 medtronic sigma  . KNEE SURGERY    . LEAD INSERTION N/A 03/05/2019   Procedure: LEAD INSERTION - RV LEAD;  Surgeon: Deboraha Sprang, MD;  Location: [place] [facility];  Service: Cardiovascular;  Laterality: N/A;  . PACEMAKER IMPLANT N/A 03/05/2019   Procedure: PACEMAKER IMPLANT;  Surgeon: Deboraha Sprang, MD;  Location: [place] [facility];  Service: Cardiovascular;   Laterality: N/A;  . PACEMAKER INSERTION    . PARS PLANA VITRECTOMY Right 07/19/2018   Procedure: VITRECTOMY WITH VITREOUS BIPOSY & ENDOLASER;  Surgeon: Jalene Mullet, MD;  Location: [place];  Service: Ophthalmology;  Laterality: Right;  . PPM GENERATOR CHANGEOUT N/A 02/02/2017   Procedure: PPM GENERATOR CHANGEOUT;  Surgeon: Deboraha Sprang, MD;  Location: [place] [facility];  Service: Cardiovascular;  Laterality: N/A;  . REPLACEMENT TOTAL KNEE BILATERAL    . VARICOSE VEIN SURGERY      There were no vitals filed for this visit.  Subjective Assessment - 08/29/19 1111    Subjective  I was doing some weeding in the yard with [facility].  I dont have any pain at rest  in my LT shoulder    Pertinent History  Bil TKR and  clavicle Fx on LT bil carpal tunnel  PACEMAKER, a fib, OA  See medical chart    Limitations  Lifting    Patient Stated Goals  I want to be able to move my arm without pain and work in garden    Currently in Pain?  Yes    Pain Score  2    5/10 at end range   Pain Location  Shoulder    Pain Orientation  Left    Pain Descriptors / Indicators  Aching;Sore    Pain  Type  Chronic pain    Pain Onset  More than a month ago    Pain Frequency  Intermittent         [facility] PT Assessment - 08/29/19 0001      Assessment   Medical Diagnosis  adhesive capsulits LT > RT    Referring Provider (PT)  Jolyn Nap MD    Onset Date/Surgical Date  02/23/19   pace maker replaced Rt to LT chest      AROM   Left Shoulder Flexion  120 Degrees    Left Shoulder ABduction  100 Degrees                    [facility] Adult PT Treatment/Exercise - 08/29/19 0001      Shoulder Exercises: Standing   External Rotation  Strengthening;Left;10 reps;Theraband    Theraband Level (Shoulder External Rotation)  Level 2 (Red)    External Rotation Limitations  x2    Internal Rotation  Strengthening;Left;10 reps;Theraband    Theraband Level (Shoulder Internal Rotation)  Level 2 (Red)    Other Standing  Exercises  wall slides bil and UE for 20 x each with pillow cases on hands for decreased friction.   walking with VC for arm swing    Other Standing Exercises  standing hip abd and ext bil x 15 each  and then hip pendulums with RT and LT with UE  support for whole body movement and VC for erect posture  hip hinge against external cue of wall x 20  deadlift 1 x 5 with 10 lb KB then 3 x 5 rep with 15 # KB with squeeze of lats ( cue of squeezing oranges with armpit      Shoulder Exercises: Stretch   Other Shoulder Stretches  bil hands on head and elbows back for stretch in supine      Manual Therapy   Manual Therapy  Joint mobilization;Soft tissue mobilization    Joint Mobilization  Grade 3/4 jt mob PA inf/post  and LAD of LT shoulder  posterior capsule stretch    Soft tissue mobilization  periscapular STW to DN mx indicated in TPDN             PT Education - 08/29/19 1207    Education Details  added single IR/ER tband hip hinge and deadlift to HEP sor strengtheining    Person(s) Educated  Patient    Methods  Explanation;Demonstration;Tactile cues;Verbal cues;Handout    Comprehension  Verbalized understanding;Returned demonstration       PT Short Term Goals - 08/23/19 1207      PT SHORT TERM GOAL #1   Title  Pt/caregiver with be able to demo proper execution of  initial HEP    Time  3    Status  Achieved    Target Date  08/23/19      PT SHORT TERM GOAL #2   Title  Pt /caregiver will be educated on FOTO report    Time  3    Period  Weeks    Status  Achieved    Target Date  08/23/19      PT SHORT TERM GOAL #3   Title  Pt will be able to lift UE to 105 without exacerbating pain    Baseline  Pt AROM flex to 110 Rt    Time  3    Period  Weeks    Status  Achieved    Target Date  08/23/19  PT Long Term Goals - 08/29/19 1205      PT LONG TERM GOAL #1   Title  Pt will be able to perform advanced HEP with assistance of caregiver and return demo on proper execution     Baseline  working on advanced HEP    Time  9    Period  Weeks    Status  On-going      PT LONG TERM GOAL #2   Title  Pain will decrease to 2/10 or less  with all functional activities and ability to sleep on LT shld    Baseline  today 2/10 but 5/10 at end range of LT shld    Time  9    Period  Weeks    Status  On-going      PT LONG TERM GOAL #3   Title  LT shoulder AROM scaption will improve to 0-120 degrees for improved overhead reaching and comfort    Baseline  LT 120,flex  100 abd    Time  9    Period  Weeks    Status  On-going      PT LONG TERM GOAL #4   Title  FOTO will improve from 60% limitation   to  42% limitation   indicating improved functional mobility.    Baseline  eval 60% limitation    Time  9    Period  Weeks    Status  On-going      PT LONG TERM GOAL #5   Title  LT shoulder IR and ER will return to [facility] to return to pain-free ADLs such as dressing and grooming.    Time  9    Period  Weeks    Status  On-going          Access Code: [email or web address]: [email or web address]: 06/09/2021Prepared by: Donnetta Simpers BeardsleyExercises  Seated Shoulder Internal Rotation with Anchored Resistance - 1 x daily - 7 x weekly - 3 sets - 10 reps  Seated Shoulder External Rotation with Resistance - 1 x daily - 7 x weekly - 3 sets - 10 reps  Standing Hip Hinge with Dowel - 1 x daily - 7 x weekly - 3 sets - 10 reps  Kettlebell Deadlift - 1 x daily - 7 x weekly - 3 sets - 5 reps    Plan - 08/29/19 1157    Clinical Impression Statement  Ms Vernell Leep enters clinic with 2/10 pain but states she has 5/10 with lifting arm.  Pt has increased AROM of LT shoulder flex to 120 and abd 100 degrees.  Pt was picking weeds out in the garden yesterday and seems to have increased function of LT arm  She is bothered by the cracking and popping but pt was educated on her hyphotic posture and her arthritis will probably always allow for popping   We want to concentrate on function and  decreased pain.  Will continue toward completion of goals.    Personal Factors and Comorbidities  Age;Comorbidity 1;Comorbidity 2    Examination-Activity Limitations  Carry;Sleep;Lift;Dressing    PT Frequency  1x / week    PT Duration  --   9 weeks   PT Treatment/Interventions  ADLs/Self Care Home Management;Cryotherapy;Moist Heat;Ultrasound;Iontophoresis 4mg /ml Dexamethasone;Therapeutic exercise;Therapeutic activities;Neuromuscular re-education;Patient/family education;Passive range of motion;Manual techniques;Dry needling;Taping;Joint Manipulations    PT Next Visit Plan  review HEP for LT shoulder manual for end range   Do FOTO recheck!  Hip hinge with 15 # 3 x 5    PT Home  Exercise Plan  [id number]  supine scapular stabilizers with yellow t band flex horizongtal abd, diagonals and bil ER. [id number]    Consulted and Agree with Plan of Care  Patient       Patient will benefit from skilled therapeutic intervention in order to improve the following deficits and impairments:  Decreased range of motion, Decreased strength, Hypomobility, Increased fascial restricitons, Increased muscle spasms, Impaired flexibility, Postural dysfunction, Improper body mechanics, Pain, Impaired UE functional use  Visit Diagnosis: Chronic left shoulder pain  Stiffness of left shoulder, not elsewhere classified  Cervicalgia  Muscle weakness (generalized)  Abnormal posture     Problem List Patient Active Problem List   Diagnosis Date Noted  . Dementia without behavioral disturbance ([place]) 07/31/2019  . Sick sinus syndrome ([place]) 07/28/2019  . Epigastric pain 06/12/2019  . Localized swelling of left lower leg 04/11/2019  . Memory loss 04/11/2019  . Prediabetes 03/14/2019  . Complete heart block ([place]) 03/05/2019  . TIA (transient ischemic attack) 01/15/2019  . Gait abnormality 01/15/2019  . Transient ischemia 12/13/2018  . Dysarthria 12/11/2018  . Hypothyroidism 12/11/2018  . Arthritis of right  hand 08/17/2018  . Chronic back pain 04/11/2018  . Encounter for therapeutic drug monitoring 03/09/2018  . Malignant hypertension 01/04/2018  . Clavicle fracture 11/20/2017  . Closed fracture of clavicle 11/20/2017  . Fall   . Accelerated hypertension   . Carpal tunnel syndrome of left wrist 08/02/2017  . Carpal tunnel syndrome of right wrist 08/02/2017  . Bradycardia 02/02/2017  . Presence of permanent cardiac pacemaker 01/28/2017  . Chronic a-fib ([place]) 01/28/2017  . Long term current use of anticoagulant therapy 01/28/2017  . Chronic venous insufficiency 01/28/2017  . Chronic atrial fibrillation ([place]) 01/28/2017  . Peripheral venous insufficiency 01/28/2017  . Cardiac pacemaker in situ 01/28/2017  . Anticoagulated 01/28/2017  . DOE (dyspnea on exertion) 09/22/2015  . Dyspnea on exertion 09/22/2015  . 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 05/15/2014  . After cataract of left eye not obscuring vision 05/15/2014  . Bilateral dry eyes 05/15/2014  . Dermatochalasis of eyelid 05/15/2014  . Hyperopia of both eyes with astigmatism and presbyopia 05/15/2014  . Irregular astigmatism of right eye 05/15/2014  . Mechanical complication due to intraocular lens implant 05/15/2014  . 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 Vitreous syneresis of both eyes 05/15/2014    [name], PT Certified Exercise Expert for the Aging Adult  08/29/19 12:11 PM Phone: [phone] Fax: [place] [facility] [address] [place], [place], [address] Phone: [phone]   Fax:  [phone]  Name: AHUVA POYNOR MRN: [id number] Date of Birth: Dec 23, 1930

## 2019-08-29 NOTE — Patient Instructions (Addendum)
Access Code: [email or web address]: [email or web address]: 06/09/2021Prepared by: Donnetta Simpers BeardsleyExercises  Seated Shoulder Internal Rotation with Anchored Resistance - 1 x daily - 7 x weekly - 3 sets - 10 reps  Seated Shoulder External Rotation with Resistance - 1 x daily - 7 x weekly - 3 sets - 10 reps  Standing Hip Hinge with Dowel - 1 x daily - 7 x weekly - 3 sets - 10 reps  Kettlebell Deadlift - 1 x daily - 7 x weekly - 3 sets - 5 reps  [name], PT Certified Exercise Expert for the Aging Adult  08/29/19 11:39 AM Phone: [phone] Fax: [phone]

## 2019-08-30 ENCOUNTER — Encounter: Payer: Medicare Other | Admitting: Physical Therapy

## 2019-09-03 ENCOUNTER — Encounter: Payer: Self-pay | Admitting: Family Medicine

## 2019-09-06 ENCOUNTER — Encounter: Payer: Self-pay | Admitting: Physical Therapy

## 2019-09-06 ENCOUNTER — Other Ambulatory Visit: Payer: Self-pay

## 2019-09-06 ENCOUNTER — Ambulatory Visit: Payer: Medicare Other | Admitting: Physical Therapy

## 2019-09-06 DIAGNOSIS — M6281 Muscle weakness (generalized): Secondary | ICD-10-CM

## 2019-09-06 DIAGNOSIS — M25512 Pain in left shoulder: Secondary | ICD-10-CM | POA: Diagnosis not present

## 2019-09-06 DIAGNOSIS — R293 Abnormal posture: Secondary | ICD-10-CM

## 2019-09-06 DIAGNOSIS — M542 Cervicalgia: Secondary | ICD-10-CM

## 2019-09-06 DIAGNOSIS — M25612 Stiffness of left shoulder, not elsewhere classified: Secondary | ICD-10-CM

## 2019-09-06 NOTE — Therapy (Signed)
[place], [place], [address] Phone: [phone]   Fax:  [phone]  Physical Therapy Treatment  Patient Details  Name: [name] MRN: [id number] Date of Birth: 11-06-1930 Referring Provider (PT): Jolyn Nap MD   Encounter Date: 09/06/2019   PT End of Session - 09/06/19 1156    Visit Number 5    Number of Visits 9    Date for PT Re-Evaluation 10/04/19    Authorization Type BCBS  Medicare   Progress note on 10th visit    PT Start Time 1100    PT Stop Time 1145    PT Time Calculation (min) 45 min    Activity Tolerance Patient tolerated treatment well    Behavior During Therapy [facility] for tasks assessed/performed           Past Medical History:  Diagnosis Date  . Arrhythmia    chronic atrial fib  . Atrial fibrillation, chronic ([place])   . Chronic anticoagulation   . Chronic atrial fibrillation ([place]) 01/28/2017  . Chronic venous insufficiency 01/28/2017  . Clavicle fracture 11/20/2017  . Complication of anesthesia    " difficult waking "  . Long term current use of anticoagulant therapy 01/28/2017  . Osteoarthritis   . Presence of permanent cardiac pacemaker 01/28/2017   Original implant 1992 [employer] 431-04 Generator change 2001 and 2011.  Medtronic generator.   . SSS (sick sinus syndrome) ([place])   . Thyroid disease    hypothyroidism  . TIA (transient ischemic attack)     Past Surgical History:  Procedure Laterality Date  . ABDOMINAL HYSTERECTOMY    . INSERT / REPLACE / REMOVE PACEMAKER     generator change 2011 medtronic sigma  . KNEE SURGERY    . LEAD INSERTION N/A 03/05/2019   Procedure: LEAD INSERTION - RV LEAD;  Surgeon: Deboraha Sprang, MD;  Location: [place] [facility];  Service: Cardiovascular;  Laterality: N/A;  . PACEMAKER IMPLANT N/A 03/05/2019   Procedure: PACEMAKER IMPLANT;  Surgeon: Deboraha Sprang, MD;  Location: [place] [facility];  Service: Cardiovascular;   Laterality: N/A;  . PACEMAKER INSERTION    . PARS PLANA VITRECTOMY Right 07/19/2018   Procedure: VITRECTOMY WITH VITREOUS BIPOSY & ENDOLASER;  Surgeon: Jalene Mullet, MD;  Location: [place];  Service: Ophthalmology;  Laterality: Right;  . PPM GENERATOR CHANGEOUT N/A 02/02/2017   Procedure: PPM GENERATOR CHANGEOUT;  Surgeon: Deboraha Sprang, MD;  Location: [place] [facility];  Service: Cardiovascular;  Laterality: N/A;  . REPLACEMENT TOTAL KNEE BILATERAL    . VARICOSE VEIN SURGERY      There were no vitals filed for this visit.   Subjective Assessment - 09/06/19 1151    Subjective Pt. reports feels like left shoulder is getting a little better. No pain pre-tx. but still with some soreness with activity. Primary recent pain has been more in posterior shoulder/periscapular region.    Pertinent History Bil TKR and  clavicle Fx on LT bil carpal tunnel  PACEMAKER, a fib, OA  See medical chart    Limitations Lifting    Patient Stated Goals I want to be able to move my arm without pain and work in garden    Currently in Pain? No/denies              [facility] PT Assessment - 09/06/19 0001      PROM   Left Shoulder Flexion 120 Degrees  [place] Adult PT Treatment/Exercise - 09/06/19 0001      Shoulder Exercises: Standing   External Rotation AROM;Strengthening;Right;Left;20 reps    Theraband Level (Shoulder External Rotation) Level 2 (Red)    External Rotation Limitations 2x10, cues to keep elbow at side    Internal Rotation AROM;Strengthening;Right;Left;20 reps    Theraband Level (Shoulder Internal Rotation) Level 2 (Red)    Internal Rotation Limitations 2x10, cues for angle ROM    Flexion Limitations alternating unilat. Theraband "punch" x 15 ea. bilat. with red Theraband    Row AROM;Strengthening;Both;20 reps    Other Standing Exercises wall slides bilat. (alternating side to side) x 10 ea. with pillow case on hands    Other Standing Exercises standing  hip abd and ext 2x10 ea. bilat. at counter, deadlift 10 lb. KB from chair with back at wall/cues for hip hinge x 15 reps (1 set of 10 and 2nd set of 5)      Manual Therapy   Manual Therapy Passive ROM    Soft tissue mobilization periscapular STM-left posterior rotator cuff and rhomboid    Passive ROM 4-way PROM/manual stretching bilat. shoulders but focus left side                  PT Education - 09/06/19 1156    Education Details exercises, Theracane use for left posterior shoulder/periscapular trigger points    Person(s) Educated Patient;Caregiver(s)    Methods Explanation;Demonstration;Verbal cues;Tactile cues    Comprehension Verbalized understanding;Returned demonstration            PT Short Term Goals - 08/23/19 1207      PT SHORT TERM GOAL #1   Title Pt/caregiver with be able to demo proper execution of  initial HEP    Time 3    Status Achieved    Target Date 08/23/19      PT SHORT TERM GOAL #2   Title Pt /caregiver will be educated on FOTO report    Time 3    Period Weeks    Status Achieved    Target Date 08/23/19      PT SHORT TERM GOAL #3   Title Pt will be able to lift UE to 105 without exacerbating pain    Baseline Pt AROM flex to 110 Rt    Time 3    Period Weeks    Status Achieved    Target Date 08/23/19             PT Long Term Goals - 08/29/19 1205      PT LONG TERM GOAL #1   Title Pt will be able to perform advanced HEP with assistance of caregiver and return demo on proper execution    Baseline working on advanced HEP    Time 9    Period Weeks    Status On-going      PT LONG TERM GOAL #2   Title Pain will decrease to 2/10 or less  with all functional activities and ability to sleep on LT shld    Baseline today 2/10 but 5/10 at end range of LT shld    Time 9    Period Weeks    Status On-going      PT LONG TERM GOAL #3   Title LT shoulder AROM scaption will improve to 0-120 degrees for improved overhead reaching and comfort     Baseline LT 120,flex  100 abd    Time 9    Period Weeks    Status On-going  PT LONG TERM GOAL #4   Title FOTO will improve from 60% limitation   to  42% limitation   indicating improved functional mobility.    Baseline eval 60% limitation    Time 9    Period Weeks    Status On-going      PT LONG TERM GOAL #5   Title LT shoulder IR and ER will return to [facility] to return to pain-free ADLs such as dressing and grooming.    Time 9    Period Weeks    Status On-going                 Plan - 09/06/19 1157    Clinical Impression Statement Tx. session focused on continued performance/progression of strengthening exercises as noted per flowsheet in addition to continued manual for shoulder ROM and STM to left periscapular region. Able to perform exercises with min-mod cues for form. Crepitus noted with PROM left>right shoulder suspec associated with underlying OK so reiterated pt. education from last visit on etiology of this/importance of progression shoulder function and posture. ROM continues to improve from baseline status-plan continue therapy for further progress to address remaining functional limitations.    Personal Factors and Comorbidities Age;Comorbidity 1;Comorbidity 2    Examination-Activity Limitations Carry;Sleep;Lift;Dressing    Stability/Clinical Decision Making Evolving/Moderate complexity    Clinical Decision Making Moderate    Rehab Potential Good    PT Frequency 1x / week    PT Duration --   9 weeks   PT Treatment/Interventions ADLs/Self Care Home Management;Cryotherapy;Moist Heat;Ultrasound;Iontophoresis 4mg /ml Dexamethasone;Therapeutic exercise;Therapeutic activities;Neuromuscular re-education;Patient/family education;Passive range of motion;Manual techniques;Dry needling;Taping;Joint Manipulations    PT Next Visit Plan FOTO recheck at visit 6, continue exercise progression for strengthening and shoulder ROM, further manual as needed    PT Home Exercise Plan  [id number]  supine scapular stabilizers with yellow t band flex horizongtal abd, diagonals and bil ER. [id number]    Consulted and Agree with Plan of Care Patient           Patient will benefit from skilled therapeutic intervention in order to improve the following deficits and impairments:  Decreased range of motion, Decreased strength, Hypomobility, Increased fascial restricitons, Increased muscle spasms, Impaired flexibility, Postural dysfunction, Improper body mechanics, Pain, Impaired UE functional use  Visit Diagnosis: Chronic left shoulder pain  Stiffness of left shoulder, not elsewhere classified  Cervicalgia  Muscle weakness (generalized)  Abnormal posture     Problem List Patient Active Problem List   Diagnosis Date Noted  . Dementia without behavioral disturbance ([place]) 07/31/2019  . Sick sinus syndrome ([place]) 07/28/2019  . Epigastric pain 06/12/2019  . Localized swelling of left lower leg 04/11/2019  . Memory loss 04/11/2019  . Prediabetes 03/14/2019  . Complete heart block ([place]) 03/05/2019  . TIA (transient ischemic attack) 01/15/2019  . Gait abnormality 01/15/2019  . Transient ischemia 12/13/2018  . Dysarthria 12/11/2018  . Hypothyroidism 12/11/2018  . Arthritis of right hand 08/17/2018  . Chronic back pain 04/11/2018  . Encounter for therapeutic drug monitoring 03/09/2018  . Malignant hypertension 01/04/2018  . Clavicle fracture 11/20/2017  . Closed fracture of clavicle 11/20/2017  . Fall   . Accelerated hypertension   . Carpal tunnel syndrome of left wrist 08/02/2017  . Carpal tunnel syndrome of right wrist 08/02/2017  . Bradycardia 02/02/2017  . Presence of permanent cardiac pacemaker 01/28/2017  . Chronic a-fib ([place]) 01/28/2017  . Long term current use of anticoagulant therapy 01/28/2017  . Chronic venous insufficiency 01/28/2017  .  Chronic atrial fibrillation ([place]) 01/28/2017  . Peripheral venous insufficiency 01/28/2017  . Cardiac  pacemaker in situ 01/28/2017  . Anticoagulated 01/28/2017  . DOE (dyspnea on exertion) 09/22/2015  . Dyspnea on exertion 09/22/2015  . 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 05/15/2014  . After cataract of left eye not obscuring vision 05/15/2014  . Bilateral dry eyes 05/15/2014  . Dermatochalasis of eyelid 05/15/2014  . Hyperopia of both eyes with astigmatism and presbyopia 05/15/2014  . Irregular astigmatism of right eye 05/15/2014  . Mechanical complication due to intraocular lens implant 05/15/2014  . 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 Vitreous syneresis of both eyes 05/15/2014    Beaulah Dinning, PT, DPT 09/06/19 12:03 PM  [facility] [address] [place], [place], [address] Phone: [phone]   Fax:  [phone]  Name: [name] MRN: [id number] Date of Birth: [birth year]

## 2019-09-13 ENCOUNTER — Other Ambulatory Visit: Payer: Self-pay

## 2019-09-13 ENCOUNTER — Ambulatory Visit: Payer: Medicare Other | Admitting: Physical Therapy

## 2019-09-13 ENCOUNTER — Encounter: Payer: Self-pay | Admitting: Physical Therapy

## 2019-09-13 DIAGNOSIS — M6281 Muscle weakness (generalized): Secondary | ICD-10-CM

## 2019-09-13 DIAGNOSIS — M542 Cervicalgia: Secondary | ICD-10-CM

## 2019-09-13 DIAGNOSIS — M25512 Pain in left shoulder: Secondary | ICD-10-CM

## 2019-09-13 DIAGNOSIS — M25612 Stiffness of left shoulder, not elsewhere classified: Secondary | ICD-10-CM

## 2019-09-13 DIAGNOSIS — R293 Abnormal posture: Secondary | ICD-10-CM

## 2019-09-13 NOTE — Therapy (Signed)
[place], [place], [address] Phone: [phone]   Fax:  [phone]  Physical Therapy Treatment  Patient Details  Name: [name] MRN: [id number] Date of Birth: December 12, 1930 Referring Provider (PT): Jolyn Nap MD   Encounter Date: 09/13/2019   PT End of Session - 09/13/19 1018    Visit Number 6    Number of Visits 9    Date for PT Re-Evaluation 10/04/19    Authorization Type BCBS  Medicare   Progress note on 10th visit    PT Start Time 1015    PT Stop Time 1105    PT Time Calculation (min) 50 min    Activity Tolerance Patient tolerated treatment well    Behavior During Therapy [facility] for tasks assessed/performed           Past Medical History:  Diagnosis Date  . Arrhythmia    chronic atrial fib  . Atrial fibrillation, chronic ([place])   . Chronic anticoagulation   . Chronic atrial fibrillation ([place]) 01/28/2017  . Chronic venous insufficiency 01/28/2017  . Clavicle fracture 11/20/2017  . Complication of anesthesia    " difficult waking "  . Long term current use of anticoagulant therapy 01/28/2017  . Osteoarthritis   . Presence of permanent cardiac pacemaker 01/28/2017   Original implant 1992 Lead [employer]med[employer] 431-04 Generator change 2001 and 2011.  Medtronic generator.   . SSS (sick sinus syndrome) ([place])   . Thyroid disease    hypothyroidism  . TIA (transient ischemic attack)     Past Surgical History:  Procedure Laterality Date  . ABDOMINAL HYSTERECTOMY    . INSERT / REPLACE / REMOVE PACEMAKER     generator change 2011 medtronic sigma  . KNEE SURGERY    . LEAD INSERTION N/A 03/05/2019   Procedure: LEAD INSERTION - RV LEAD;  Surgeon: Deboraha Sprang, MD;  Location: [place] [facility];  Service: Cardiovascular;  Laterality: N/A;  . PACEMAKER IMPLANT N/A 03/05/2019   Procedure: PACEMAKER IMPLANT;  Surgeon: Deboraha Sprang, MD;  Location: [place] [facility];  Service: Cardiovascular;   Laterality: N/A;  . PACEMAKER INSERTION    . PARS PLANA VITRECTOMY Right 07/19/2018   Procedure: VITRECTOMY WITH VITREOUS BIPOSY & ENDOLASER;  Surgeon: Jalene Mullet, MD;  Location: [place];  Service: Ophthalmology;  Laterality: Right;  . PPM GENERATOR CHANGEOUT N/A 02/02/2017   Procedure: PPM GENERATOR CHANGEOUT;  Surgeon: Deboraha Sprang, MD;  Location: [place] [facility];  Service: Cardiovascular;  Laterality: N/A;  . REPLACEMENT TOTAL KNEE BILATERAL    . VARICOSE VEIN SURGERY      There were no vitals filed for this visit.   Subjective Assessment - 09/13/19 1029    Subjective Pt reports that LT shld pops a lot .  No pain sitting  no pain in shld but pain in thoracic back I was able to take a shower by myself today and dress my self.    Pertinent History Bil TKR and  clavicle Fx on LT bil carpal tunnel  PACEMAKER, a fib, OA  See medical chart    Limitations Lifting    Patient Stated Goals I want to be able to move my arm without pain and work in garden    Currently in Pain? Yes    Pain Score 0-No pain   4/10 at end range   Pain Location Shoulder    Pain Orientation Left    Pain Descriptors / Indicators Aching  Pain Type Chronic pain    Pain Onset More than a month ago    Pain Frequency Intermittent    Multiple Pain Sites Yes    Pain Score 3    Pain Location Thoracic    Pain Orientation Mid    Pain Descriptors / Indicators Aching    Pain Type Chronic pain    Pain Onset More than a month ago    Pain Frequency Intermittent                             [facility] Adult PT Treatment/Exercise - 09/13/19 0001      Self-Care   Self-Care Posture;Heat/Ice Application    Posture position for exercises and reduce popping with shoulder above 90 degrees    Heat/Ice Application ice massage for home use and explainedn demo for caregiver      Shoulder Exercises: Standing   External Rotation AROM;Strengthening;Right;Left;20 reps    Theraband Level (Shoulder External  Rotation) Level 2 (Red)    External Rotation Limitations 2x10, cues to keep elbow at side and to not elevate upper traps    Internal Rotation AROM;Strengthening;Right;Left;20 reps    Theraband Level (Shoulder Internal Rotation) Level 2 (Red)    Internal Rotation Limitations 2x10, cues for angle ROM and not to elevate upper trap    Flexion Limitations alternating unilat. Theraband "punch" x 15 ea. bilat. with red Theraband    Extension Strengthening;Both;10 reps;Theraband    Theraband Level (Shoulder Extension) Level 2 (Red)    Extension Limitations x2 VC to not elevate upper trap    Row Strengthening;Both;10 reps;Theraband    Theraband Level (Shoulder Row) Level 2 (Red)    Row Limitations x2  VC to not elevate  upper trap    Other Standing Exercises wall slides bilat. (alternating side to side) x 10 ea. with pillow case on hands    Other Standing Exercises horizontal abd with red t band 2 x 10 standing and lying down 1 x 10      Cryotherapy   Number Minutes Cryotherapy 8 Minutes    Cryotherapy Location Shoulder   LT ant   Type of Cryotherapy Ice massage      Manual Therapy   Manual Therapy Passive ROM    Manual therapy comments skilled palpation with TPDN    Soft tissue mobilization periscapular STM-left posterior rotator cuff and rhomboid, LT pecs    Passive ROM 4-way PROM/manual stretching bilat. shoulders but focus left side            Trigger Point Dry Needling - 09/13/19 0001    Consent Given? Yes    Education Handout Provided Previously provided    Muscles Treated Upper Quadrant Pectoralis major;Pectoralis minor    Dry Needling Comments 40 mm .25 mm    Pectoralis Major Response Twitch response elicited;Palpable increased muscle length    Pectoralis Minor Response Twitch response elicited;Palpable increased muscle length                PT Education - 09/13/19 1109    Education Details use of ice massage for cryotherapy for inflammation, emphasis on posture and  positioning for exericise    Person(s) Educated Patient;Caregiver(s)    Methods Explanation;Demonstration    Comprehension Verbalized understanding;Returned demonstration            PT Short Term Goals - 08/23/19 1207      PT SHORT TERM GOAL #1   Title Pt/caregiver with be  able to demo proper execution of  initial HEP    Time 3    Status Achieved    Target Date 08/23/19      PT SHORT TERM GOAL #2   Title Pt /caregiver will be educated on FOTO report    Time 3    Period Weeks    Status Achieved    Target Date 08/23/19      PT SHORT TERM GOAL #3   Title Pt will be able to lift UE to 105 without exacerbating pain    Baseline Pt AROM flex to 110 Rt    Time 3    Period Weeks    Status Achieved    Target Date 08/23/19             PT Long Term Goals - 09/13/19 1032      PT LONG TERM GOAL #1   Title Pt will be able to perform advanced HEP with assistance of caregiver and return demo on proper execution    Baseline working on advanced HEP    Time 9    Period Weeks    Status On-going      PT LONG TERM GOAL #2   Baseline today 0/10 at rest  and 4/10 at end range LT shld  pain in thoracic    Time 9    Status On-going      PT LONG TERM GOAL #3   Title LT shoulder AROM scaption will improve to 0-120 degrees for improved overhead reaching and comfort    Baseline LT 120,flex  100 abd pain at 4/10    Time 9    Period Weeks    Status On-going      PT LONG TERM GOAL #4   Title FOTO will improve from 60% limitation   to  42% limitation   indicating improved functional mobility.    Baseline eval 60% limitation  36% limitation    Time 9    Period Weeks    Status Achieved      PT LONG TERM GOAL #5   Title LT shoulder IR and ER will return to [facility] to return to pain-free ADLs such as dressing and grooming.    Baseline able to take shower and dress this morning independently and made bed    Time 9    Period Weeks    Status Achieved                 Plan - 09/13/19  1113    Clinical Impression Statement Tx session focused on posture with exericses and decrease of elevation of upper trap and posture to decrease kyphotic posture. THer ex as on flowsheet, manual and TPDN as consented by patient and closely monitored during session. STM to periscapular and ice massage to anterior shoulder.  Pt reports able to shower herself and dress hersefl this morning. . shld flex 120 and FOTO improved to 36% limitation . LTG achieved # 4 and #5    Personal Factors and Comorbidities Age;Comorbidity 1;Comorbidity 2    Examination-Activity Limitations Carry;Sleep;Lift;Dressing    PT Frequency 1x / week    PT Duration --   9 weeks   PT Treatment/Interventions ADLs/Self Care Home Management;Cryotherapy;Moist Heat;Ultrasound;Iontophoresis 4mg /ml Dexamethasone;Therapeutic exercise;Therapeutic activities;Neuromuscular re-education;Patient/family education;Passive range of motion;Manual techniques;Dry needling;Taping;Joint Manipulations    PT Next Visit Plan continue exercise progression for strengthening and shoulder ROM, further manual as needed Should achieve all goals in 3 vists or less    PT Home Exercise  Plan [id number]  supine scapular stabilizers with yellow t band flex horizongtal abd, diagonals and bil ER. [id number]    Consulted and Agree with Plan of Care Patient;Family member/caregiver           Patient will benefit from skilled therapeutic intervention in order to improve the following deficits and impairments:  Decreased range of motion, Decreased strength, Hypomobility, Increased fascial restricitons, Increased muscle spasms, Impaired flexibility, Postural dysfunction, Improper body mechanics, Pain, Impaired UE functional use  Visit Diagnosis: Chronic left shoulder pain  Stiffness of left shoulder, not elsewhere classified  Cervicalgia  Muscle weakness (generalized)  Abnormal posture     Problem List Patient Active Problem List   Diagnosis Date  Noted  . Dementia without behavioral disturbance ([place]) 07/31/2019  . Sick sinus syndrome ([place]) 07/28/2019  . Epigastric pain 06/12/2019  . Localized swelling of left lower leg 04/11/2019  . Memory loss 04/11/2019  . Prediabetes 03/14/2019  . Complete heart block ([place]) 03/05/2019  . TIA (transient ischemic attack) 01/15/2019  . Gait abnormality 01/15/2019  . Transient ischemia 12/13/2018  . Dysarthria 12/11/2018  . Hypothyroidism 12/11/2018  . Arthritis of right hand 08/17/2018  . Chronic back pain 04/11/2018  . Encounter for therapeutic drug monitoring 03/09/2018  . Malignant hypertension 01/04/2018  . Clavicle fracture 11/20/2017  . Closed fracture of clavicle 11/20/2017  . Fall   . Accelerated hypertension   . Carpal tunnel syndrome of left wrist 08/02/2017  . Carpal tunnel syndrome of right wrist 08/02/2017  . Bradycardia 02/02/2017  . Presence of permanent cardiac pacemaker 01/28/2017  . Chronic a-fib ([place]) 01/28/2017  . Long term current use of anticoagulant therapy 01/28/2017  . Chronic venous insufficiency 01/28/2017  . Chronic atrial fibrillation ([place]) 01/28/2017  . Peripheral venous insufficiency 01/28/2017  . Cardiac pacemaker in situ 01/28/2017  . Anticoagulated 01/28/2017  . DOE (dyspnea on exertion) 09/22/2015  . Dyspnea on exertion 09/22/2015  . 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 05/15/2014  . After cataract of left eye not obscuring vision 05/15/2014  . Bilateral dry eyes 05/15/2014  . Dermatochalasis of eyelid 05/15/2014  . Hyperopia of both eyes with astigmatism and presbyopia 05/15/2014  . Irregular astigmatism of right eye 05/15/2014  . Mechanical complication due to intraocular lens implant 05/15/2014  . 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 Vitreous syneresis of both eyes 05/15/2014    [name], PT Certified Exercise Expert for the Aging Adult  09/13/19 11:19 AM Phone: [phone] Fax: [place]  [facility] [address] [place], [place], [address] Phone: [phone]   Fax:  [phone]  Name: [name] MRN: [id number] Date of Birth: August 28, 1930

## 2019-09-17 ENCOUNTER — Other Ambulatory Visit (INDEPENDENT_AMBULATORY_CARE_PROVIDER_SITE_OTHER): Payer: Medicare Other

## 2019-09-17 ENCOUNTER — Telehealth: Payer: Self-pay

## 2019-09-17 DIAGNOSIS — E039 Hypothyroidism, unspecified: Secondary | ICD-10-CM

## 2019-09-17 LAB — TSH: TSH: 1.45 u[IU]/mL (ref 0.35–4.50)

## 2019-09-17 NOTE — Chronic Care Management (AMB) (Deleted)
Chronic Care Management Pharmacy  Name: [name]  MRN: [id number] DOB: May 21, 1930  Chief Complaint/ HPI  [name],  86 y.o. , female presents for their Initial CCM visit with the clinical pharmacist via telephone.  PCP : [name], MD  Their chronic conditions include: Chronic Afib, HTN, HLD, hypothyroidism, complete heart block, dementia without behavioral disturbance  Office Visits: 07/31/19 PCP visit - Chronic Afib followed by Dr. Wynonia Lawman (Cardio). Stopped digoxin. BP elevated but improved leg swelling due to switch from amlodipine to lisinopril. Has F/U with Dr. Delice Lesch for dementia. Planning to start on donepezil if unable to be seen by Dr. Delice Lesch sooner.  Consult Visit: Weekly outpatient rehab for chronic left shoulder pain 07/30/19 OV Caryl Comes, Cardio) - Stopped digoxin. 07/24/19 OV Nicole Kindred, Neuro) - Patient lost capacity to independently make medical and financial decisions of high risk. Trial of Aricept is warranted. Also, patient is on oxybutinin that needs to be reviewed.  Medications: Outpatient Encounter Medications as of 09/18/2019  Medication Sig  . acetaminophen (TYLENOL) 500 MG tablet Take 1,000 mg by mouth every 8 (eight) hours as needed for moderate pain or headache.   Marland Kitchen apixaban (ELIQUIS) 5 MG TABS tablet Take 1 tablet (5 mg total) by mouth 2 (two) times daily.  Marland Kitchen atorvastatin (LIPITOR) 20 MG tablet TAKE 1 TABLET BY MOUTH EVERY DAY IN THE EVENING  . Carboxymethylcell-Hypromellose (GENTEAL OP) Place 1 drop into both eyes daily as needed (dry eyes).  . Cholecalciferol (VITAMIN D) 50 MCG (2000 UT) tablet Take 1 tablet (2,000 Units total) by mouth daily.  . Cyanocobalamin (B-12) 1000 MCG CAPS TAKE 1 CAPSULE BY MOUTH EVERY DAY  . fluticasone (FLONASE) 50 MCG/ACT nasal spray Place 1 spray into both nostrils daily as needed for allergies or rhinitis.  Marland Kitchen levothyroxine (SYNTHROID) 88 MCG tablet Take 1 tablet by mouth every morning on an empty stomach  with water only.  No food or other medications for 30 minutes.  Marland Kitchen lisinopril (ZESTRIL) 10 MG tablet Take 1 tablet (10 mg total) by mouth daily.  Marland Kitchen oxybutynin (DITROPAN-XL) 5 MG 24 hr tablet TAKE 1 TABLET BY MOUTH EVERY DAY  . sertraline (ZOLOFT) 25 MG tablet Take 25 mg by mouth every evening.    No facility-administered encounter medications on file as of 09/18/2019.     Current Diagnosis/Assessment:  Goals Addressed   None     AFIB   Patient is currently {CHL HP BP RATE/[phone]} controlled. HR *** BPM  Patient has failed these meds in past: digoxin, diltiazem, amlodipine Patient is currently {CHL Controlled/Uncontrolled:[phone]} on the following medications:   Eliquis 5 mg 1 tablet twice daily  We discussed:  {CHL HP Upstream Pharmacy discussion:[phone]}  Plan  Continue {CHL HP Upstream Pharmacy Plans:[phone]}   Hypertension   BP today is:  {CHL HP UPSTREAM Pharmacist BP ranges:[phone]}  Office blood pressures are  BP Readings from Last 3 Encounters:  07/31/19 (!) 170/80  07/30/19 (!) 168/98  06/19/19 135/72    Patient has failed these meds in the past: amlodipine, diltiazem Patient is currently {CHL Controlled/Uncontrolled:[phone]} on the following medications:  . Lisinopril 10 mg 1 tablet daily  Patient checks BP at home {CHL HP BP Monitoring [phone]}  Patient home BP readings are ranging: ***  We discussed {CHL HP Upstream Pharmacy discussion:[phone]}  Plan  Continue {CHL HP Upstream Pharmacy HUTML:[id number]}     Hyperlipidemia   Lipid Panel     Component Value Date/Time   CHOL 212 (H) 12/12/2018 0800  TRIG 72 12/12/2018 0800   HDL 78 12/12/2018 0800   CHOLHDL 2.7 12/12/2018 0800   VLDL 14 12/12/2018 0800   LDLCALC 120 (H) 12/12/2018 0800     The ASCVD Risk score Mikey Bussing DC Jr., et al., 2013) failed to calculate for the following reasons:   The 2013 ASCVD risk score is only valid for ages 49 to 81    Patient has failed these meds in past: none Patient is currently {CHL Controlled/Uncontrolled:9544040117} on the following medications:  . Atorvastatin 20 mg 1 tablet in the evening  We discussed:  {CHL HP Upstream Pharmacy discussion:[phone]}  Plan  Continue {CHL HP Upstream Pharmacy EXBMW:[id number]}    Dementia   Patient has failed these meds in past: none Patient is currently {CHL Controlled/Uncontrolled:9544040117} on the following medications:  . Sertraline 25 mg 1 tablet in the evening  We discussed:  ***  Plan  Continue {CHL HP Upstream Pharmacy VOZDG:[id number]}    Hypothyroidism   Lab Results  Component Value Date/Time   TSH 1.45 09/17/2019 10:22 AM   TSH 0.12 (L) 08/14/2019 10:37 AM   FREET4 1.91 (H) 07/31/2019 10:16 AM   FREET4 1.5 06/19/2019 11:42 AM    Patient has failed these meds in past: none Patient is currently {CHL Controlled/Uncontrolled:9544040117} on the following medications:  . Levothyroxine 88 mcg 1 tablet daily  We discussed: dose decreased recently   Plan  Continue {CHL HP Upstream Pharmacy ZDGLO:[id number]}    OTCs/Health Maintenance   Patient is currently {CHL Controlled/Uncontrolled:9544040117} on the following medications: . APAP 500 mg 2 tablets every 8 hrs as needed for moderate pain or headache . Genteal OP 1 drop into both eyes daily as needed for dry eyes . Vitamin D 2000 units 1 tablet daily . Vitamin B12 1000 mcg 1 capsule daily . Flonase 50 mcg/act 1 spray into both nostrils as needed for allergies or rhinitis . Oxybutinin 5 mg 1 tablet daily ***  We discussed:  ***  Plan  Continue {CHL HP Upstream Pharmacy OACZY:[id number]}    Vaccines   Reviewed and discussed patient's vaccination history.    Immunization History  Administered Date(s) Administered  . Influenza, High Dose Seasonal PF 11/16/2018  . Influenza-Unspecified 11/20/2013  . PFIZER SARS-COV-2 Vaccination 05/13/2019, 06/06/2019  .  Pneumococcal Conjugate-13 03/14/2019    Plan  Recommended patient receive *** vaccine in *** office.    Medication Management   Pharmacy/Benefits: [facility] Adherence:  Pt endorses ***% compliance  We discussed: ***  Plan  {US Pharmacy XNAT:55732}   Follow up: *** month phone visit  Timer :  CPP Chart prep 14:56:03 15:17:53 0:21:50

## 2019-09-17 NOTE — Chronic Care Management (AMB) (Deleted)
Chronic Care Management Pharmacy  Name: [name]  MRN: [id number] DOB: 1930/11/01  Chief Complaint/ HPI  [name],  86 y.o. , female presents for their {Initial/Follow-up:[id number]} CCM visit with the clinical pharmacist {CHL HP Upstream Pharm visit SNKN:[id number]}.  PCP : [name], MD  Their chronic conditions include: Chronic Afib, HTN, HLD, hypothyroidism, complete heart block, dementia without behavioral disturbance  Office Visits: 07/31/19 OV - Chronic Afib followed by Dr. Wynonia Lawman (Cardio). Stopped digoxin. BP elevated but improved leg swelling due to switch from amlodipine to lisinopril. Has F/U with Dr. Delice Lesch for dementia. Planning to start on donepezil if unable to be seen by Dr. Delice Lesch sooner.  Consult Visit: 07/30/19 OV Caryl Comes, Cardio) - Stopped digoxin.  07/24/19 OV Nicole Kindred, Neuro) - Patient lost capacity to independently make medical and financial decisions of high risk. Trial of Aricept is warranted. Also, patient is on oxybutinin that needs to be reviewed.  Medications: Outpatient Encounter Medications as of 09/18/2019  Medication Sig  . acetaminophen (TYLENOL) 500 MG tablet Take 1,000 mg by mouth every 8 (eight) hours as needed for moderate pain or headache.   Marland Kitchen apixaban (ELIQUIS) 5 MG TABS tablet Take 1 tablet (5 mg total) by mouth 2 (two) times daily.  Marland Kitchen atorvastatin (LIPITOR) 20 MG tablet TAKE 1 TABLET BY MOUTH EVERY DAY IN THE EVENING  . Carboxymethylcell-Hypromellose (GENTEAL OP) Place 1 drop into both eyes daily as needed (dry eyes).  . Cholecalciferol (VITAMIN D) 50 MCG (2000 UT) tablet Take 1 tablet (2,000 Units total) by mouth daily.  . Cyanocobalamin (B-12) 1000 MCG CAPS TAKE 1 CAPSULE BY MOUTH EVERY DAY  . fluticasone (FLONASE) 50 MCG/ACT nasal spray Place 1 spray into both nostrils daily as needed for allergies or rhinitis.  Marland Kitchen levothyroxine (SYNTHROID) 88 MCG tablet Take 1 tablet by mouth every morning on an empty stomach with  water only.  No food or other medications for 30 minutes.  Marland Kitchen lisinopril (ZESTRIL) 10 MG tablet Take 1 tablet (10 mg total) by mouth daily.  Marland Kitchen oxybutynin (DITROPAN-XL) 5 MG 24 hr tablet TAKE 1 TABLET BY MOUTH EVERY DAY  . sertraline (ZOLOFT) 25 MG tablet Take 25 mg by mouth every evening.    No facility-administered encounter medications on file as of 09/18/2019.     Current Diagnosis/Assessment:  Goals Addressed   None     AFIB   Patient is currently {CHL HP BP RATE/RHYTHM:[phone]} controlled. HR *** BPM  Patient has failed these meds in past: digoxin, diltiazem, amlodipine Patient is currently {CHL Controlled/Uncontrolled:[phone]} on the following medications:   Eliquis 5 mg 1 tablet twice daily  We discussed:  {CHL HP Upstream Pharmacy discussion:[phone]}  Plan  Continue {CHL HP Upstream Pharmacy [phone]}   Hypertension   BP today is:  {CHL HP UPSTREAM Pharmacist BP ranges:[phone]}  Office blood pressures are  BP Readings from Last 3 Encounters:  07/31/19 (!) 170/80  07/30/19 (!) 168/98  06/19/19 135/72    Patient has failed these meds in the past: amlodipine, diltiazem Patient is currently {CHL Controlled/Uncontrolled:[phone]} on the following medications:  . Lisinopril 10 mg 1 tablet daily  Patient checks BP at home {CHL HP BP Monitoring [phone]}  Patient home BP readings are ranging: ***  We discussed {CHL HP Upstream Pharmacy discussion:[phone]}  Plan  Continue {CHL HP Upstream Pharmacy XTKWI:[id number]}     Hyperlipidemia   Lipid Panel     Component Value Date/Time   CHOL 212 (H) 12/12/2018 0800   TRIG 72  12/12/2018 0800   HDL 78 12/12/2018 0800   CHOLHDL 2.7 12/12/2018 0800   VLDL 14 12/12/2018 0800   LDLCALC 120 (H) 12/12/2018 0800     The ASCVD Risk score Mikey Bussing DC Jr., et al., 2013) failed to calculate for the following reasons:   The 2013 ASCVD risk score is only valid for ages 50 to 49    Patient has failed these meds in past: none Patient is currently {CHL Controlled/[phone]} on the following medications:  . Atorvastatin 20 mg 1 tablet in the evening  We discussed:  {CHL HP Upstream Pharmacy discussion:[phone]}  Plan  Continue {CHL HP Upstream Pharmacy WTUUE:[id number]}    Dementia   Patient has failed these meds in past: none Patient is currently {CHL Controlled/[phone]} on the following medications:  . Sertraline 25 mg 1 tablet in the evening  We discussed:  ***  Plan  Continue {CHL HP Upstream Pharmacy Plans:[phone]}    Hypothyroidism   Lab Results  Component Value Date/Time   TSH 1.45 09/17/2019 10:22 AM   TSH 0.12 (L) 08/14/2019 10:37 AM   FREET4 1.91 (H) 07/31/2019 10:16 AM   FREET4 1.5 06/19/2019 11:42 AM    Patient has failed these meds in past: none Patient is currently {CHL Controlled/[phone]} on the following medications:  . Levothyroxine 88 mcg 1 tablet daily  We discussed:  {CHL HP Upstream Pharmacy discussion:[phone]}  Plan  Continue {CHL HP Upstream Pharmacy XTAVW:[id number]}    OTCs/Health Maintenance   Patient is currently {CHL Controlled/[phone]} on the following medications: . APAP 500 mg 2 tablets every 8 hrs as needed for moderate pain or headache . Genteal OP 1 drop into both eyes daily as needed for dry eyes . Vitamin D 2000 units 1 tablet daily . Vitamin B12 1000 mcg 1 capsule daily . Flonase 50 mcg/act 1 spray into both nostrils as needed for allergies or rhinitis . Oxybutinin 5 mg 1 tablet daily ***  We discussed:  ***  Plan  Continue {CHL HP Upstream Pharmacy VZSMO:[id number]}    Vaccines   Reviewed and discussed patient's vaccination history.    Immunization History  Administered Date(s) Administered  . Influenza, High Dose Seasonal PF 11/16/2018  . Influenza-Unspecified 11/20/2013  . PFIZER SARS-COV-2 Vaccination 05/13/2019,  06/06/2019  . Pneumococcal Conjugate-13 03/14/2019    Plan  Recommended patient receive *** vaccine in *** office.    Medication Management   Pharmacy[facility]Benefits: [facility] Adherence:  Pt endorses ***% compliance  We discussed: ***  Plan  {US Pharmacy EEFE:07121}   Follow up: *** month phone visit  Timer :  CPP Chart prep 14:56:03 15:17:53 0:21:50

## 2019-09-17 NOTE — Telephone Encounter (Signed)
Pt said she is sleepy all the time for several weeks; pt can be watching TV and just go to sleep. Pt sleeps from 10:30 pm to 7:30 AM and pt wakes up twice during the night to urinate and then goes right back to sleep. Pt said Lt eye is getting weaker and pt has seen eye dr and has had implants but that is just [place]. No H/A, dizziness,CP or SOB. Pt said should not be as sleepy as she is. Pt said head congestion on lt side of nose; pt has prod cough with clear phlegm. No fever. Pt scheduled my chart appt today with Dr Einar Pheasant; Tod Persia is going to help pt with my chart visit; will have vitals ready when [place] calls. UC & ED precautions given and Makayla voiced understanding.

## 2019-09-18 ENCOUNTER — Telehealth: Payer: Self-pay

## 2019-09-18 ENCOUNTER — Telehealth: Payer: Self-pay | Admitting: Family Medicine

## 2019-09-18 ENCOUNTER — Telehealth: Payer: Medicare Other

## 2019-09-18 ENCOUNTER — Telehealth: Payer: Medicare Other | Admitting: Family Medicine

## 2019-09-18 NOTE — Progress Notes (Signed)
°  Chronic Care Management   Note  09/18/2019 Name: [name] MRN: [id number] DOB: 04/05/1930  [name] is a 86 y.o. year old female who is a primary care patient of Einar Pheasant, Jobe Marker, MD. I reached out to [name] by phone today in response to a referral sent by [name]'s PCP, [name], MD.   [name] was given information about Chronic Care Management services today including:  1. CCM service includes personalized support from designated clinical staff supervised by her physician, including individualized plan of care and coordination with other care providers 2. 24/7 contact phone numbers for assistance for urgent and routine care needs. 3. Service will only be billed when office clinical staff spend 20 minutes or more in a month to coordinate care. 4. Only one practitioner may furnish and bill the service in a calendar month. 5. The patient may stop CCM services at any time (effective at the end of the month) by phone call to the office staff.   Patient agreed to services and verbal consent obtained.  This note is not being shared with the patient for the following reason: To respect privacy (The patient or proxy has requested that the information not be shared).  Follow up plan:   Earney [place] [employer] Scheduler

## 2019-09-18 NOTE — Telephone Encounter (Signed)
°  Chronic Care Management   Outreach Note  09/18/2019 Name: [name] MRN: [id number] DOB: 30-Nov-1930  PCP: [name], MD  Attempted to contact patient for initial CCM visit on 09/18/19 at 11:00 AM by telephone. Left voicemail with contact information for rescheduling.  Debbora Dus, PharmD Clinical Pharmacist [facility] [phone]

## 2019-09-18 NOTE — Telephone Encounter (Signed)
Attempted to call pt's daughter, [name], [facility]) and relay the mychart message from Dr. Einar Pheasant that is unread. No answer. Will try again tomorrow.

## 2019-09-18 NOTE — Telephone Encounter (Signed)
Patient called back and would like to reschedule for face to face appointment October 02, 2019 at [place], PharmD Clinical Pharmacist [facility] [phone]

## 2019-09-19 ENCOUNTER — Telehealth: Payer: Self-pay

## 2019-09-19 ENCOUNTER — Telehealth: Payer: Self-pay | Admitting: Neurology

## 2019-09-19 NOTE — Telephone Encounter (Signed)
See PCP note, PCP has also contacted me about it. Pls put on high priority wait list, thanks

## 2019-09-19 NOTE — Telephone Encounter (Signed)
Pt canceled mychart.   Please call to check in.

## 2019-09-19 NOTE — Telephone Encounter (Signed)
Patient's daughter called with concerns about the patient's behavior. She further explained, "Mom is about to loose her caregiver due to her poor behavior." She'd like Dr. Amparo Bristol advice.   If possible, she'd also like an appointment on or after 10/09/19 for the patient. Patient added to wait list.

## 2019-09-19 NOTE — Telephone Encounter (Signed)
Spoke with daughter who is concerned that the pt is "going off the deep end"   She has been combative and has been screaming at her caregiver who may consider quitting at this point. Having a more difficult time.   She will reach out to neurology to see if an earlier appointment can be made. As this is potentially impacting her ability to keep caregivers would benefit from earlier appointment as symptoms are worsening.   Daughter will call neurology  Routing to neurologist to see if she can work the patient in given progression and behavior changes

## 2019-09-19 NOTE — Telephone Encounter (Signed)
Please have daughter schedule appointment to discuss management options. Neurology has placed on cancellation list.

## 2019-09-19 NOTE — Telephone Encounter (Signed)
Spoke to pt's daughter to pass along the [employer] response, from Dr. Einar Pheasant, that had not been read yet. Daughter's concerns continue to be that her mom is combative and would like an increase in her anti-depressant, as well as wanting to know if it's ok for her mom to take all of her AM meds at 8am when her aide arrives and reminds her.  Contact: Hortencia Pilar [phone]

## 2019-09-19 NOTE — Telephone Encounter (Signed)
Hi Dr. Einar Pheasant, with progressive behavioral changes, she probably will benefit from seeing Psychiatry more. I usually just start an SSRI or Depakote for mood stabilization, is this something you can do in the meantime? She is on the cancellation list. Thanks

## 2019-09-19 NOTE — Telephone Encounter (Signed)
Spoke to daughter, [name] [facility]), and scheduled and appt for 7/19 to discuss management options, per Dr. Einar Pheasant.

## 2019-09-19 NOTE — Telephone Encounter (Signed)
Have tried to call daughter but no answer. Any input? Thanks

## 2019-09-19 NOTE — Telephone Encounter (Signed)
Please see note thanks 

## 2019-09-19 NOTE — Telephone Encounter (Signed)
I spoke to pt this morning and she did not state any concerns. Her aide woke her to speak to me.

## 2019-09-20 ENCOUNTER — Ambulatory Visit: Payer: Medicare Other | Attending: Internal Medicine | Admitting: Physical Therapy

## 2019-09-20 ENCOUNTER — Other Ambulatory Visit: Payer: Self-pay

## 2019-09-20 ENCOUNTER — Encounter: Payer: Self-pay | Admitting: Physical Therapy

## 2019-09-20 DIAGNOSIS — M542 Cervicalgia: Secondary | ICD-10-CM | POA: Insufficient documentation

## 2019-09-20 DIAGNOSIS — G8929 Other chronic pain: Secondary | ICD-10-CM | POA: Diagnosis not present

## 2019-09-20 DIAGNOSIS — M25612 Stiffness of left shoulder, not elsewhere classified: Secondary | ICD-10-CM | POA: Diagnosis not present

## 2019-09-20 DIAGNOSIS — M25512 Pain in left shoulder: Secondary | ICD-10-CM | POA: Insufficient documentation

## 2019-09-20 DIAGNOSIS — R293 Abnormal posture: Secondary | ICD-10-CM | POA: Diagnosis not present

## 2019-09-20 DIAGNOSIS — M6281 Muscle weakness (generalized): Secondary | ICD-10-CM | POA: Diagnosis not present

## 2019-09-20 NOTE — Patient Instructions (Signed)
\      [name], PT Certified Exercise Expert for the Aging Adult  09/20/19 11:55 AM Phone: [phone] Fax: [phone]

## 2019-09-20 NOTE — Therapy (Signed)
[place], [place], [address] Phone: [phone]   Fax:  [phone]  Physical Therapy Treatment  Patient Details  Name: [name] MRN: [id number] Date of Birth: 1930-07-21 Referring Provider (PT): Jolyn Nap MD   Encounter Date: 09/20/2019   PT End of Session - 09/20/19 1254    Visit Number 7    Number of Visits 9    Date for PT Re-Evaluation 10/04/19    Authorization Type BCBS  Medicare   Progress note on 10th visit    PT Start Time 1105    PT Stop Time 1150    PT Time Calculation (min) 45 min    Activity Tolerance Patient tolerated treatment well    Behavior During Therapy [facility] for tasks assessed/performed           Past Medical History:  Diagnosis Date   Arrhythmia    chronic atrial fib   Atrial fibrillation, chronic ([place])    Chronic anticoagulation    Chronic atrial fibrillation ([place]) 01/28/2017   Chronic venous insufficiency 01/28/2017   Clavicle fracture 09/32/3557   Complication of anesthesia    " difficult waking "   Long term current use of anticoagulant therapy 01/28/2017   Osteoarthritis    Presence of permanent cardiac pacemaker 01/28/2017   Original implant 1992 [employer] 431-04 Generator change 2001 and 2011.  Medtronic generator.    SSS (sick sinus syndrome) ([place])    Thyroid disease    hypothyroidism   TIA (transient ischemic attack)     Past Surgical History:  Procedure Laterality Date   ABDOMINAL HYSTERECTOMY     INSERT / REPLACE / REMOVE PACEMAKER     generator change 2011 medtronic sigma   KNEE SURGERY     LEAD INSERTION N/A 03/05/2019   Procedure: LEAD INSERTION - RV LEAD;  Surgeon: Deboraha Sprang, MD;  Location: [place] [facility];  Service: Cardiovascular;  Laterality: N/A;   PACEMAKER IMPLANT N/A 03/05/2019   Procedure: PACEMAKER IMPLANT;  Surgeon: Deboraha Sprang, MD;  Location: [place] [facility];  Service: Cardiovascular;   Laterality: N/A;   PACEMAKER INSERTION     PARS PLANA VITRECTOMY Right 07/19/2018   Procedure: VITRECTOMY WITH VITREOUS BIPOSY & ENDOLASER;  Surgeon: Jalene Mullet, MD;  Location: [facility];  Service: Ophthalmology;  Laterality: Right;   PPM GENERATOR CHANGEOUT N/A 02/02/2017   Procedure: PPM GENERATOR CHANGEOUT;  Surgeon: Deboraha Sprang, MD;  Location: [place] [facility];  Service: Cardiovascular;  Laterality: N/A;   REPLACEMENT TOTAL KNEE BILATERAL     VARICOSE VEIN SURGERY      There were no vitals filed for this visit.   Subjective Assessment - 09/20/19 1214    Subjective Pt reports that LT shld doing better and Makiyah her personal aide reported she saw her climbing a ladder with her son behind her and reaching up to do tasks.  she is doing well utilizing her shoulder.  She does express fear of falling    Pertinent History Bil TKR and  clavicle Fx on LT bil carpal tunnel  PACEMAKER, a fib, OA  See medical chart    Limitations Lifting    Currently in Pain? No/denies    Pain Score 0-No pain    Pain Location Shoulder    Pain Orientation Left    Pain Descriptors / Indicators Aching    Pain Type Chronic pain    Pain Onset More than a month ago  Pain Frequency Intermittent    Pain Score 3    Pain Location Thoracic    Pain Orientation Mid    Pain Descriptors / Indicators Aching    Pain Type Chronic pain    Pain Onset More than a month ago    Pain Frequency Intermittent              [facility] PT Assessment - 09/20/19 0001      Assessment   Medical Diagnosis adhesive capsulits LT > RT    Referring Provider (PT) Jolyn Nap MD    Onset Date/Surgical Date 02/23/19   pace maker replaced Rt to LT chest      AROM   Right Shoulder Flexion 125 Degrees    Right Shoulder ABduction 107 Degrees    Left Shoulder Flexion 122 Degrees    Left Shoulder ABduction 103 Degrees      PROM   Right Shoulder Flexion 110 Degrees    Left Shoulder Flexion 126 Degrees      Strength   Overall  Strength Deficits    Right Shoulder Flexion 4/5    Right Shoulder ABduction 4-/5    Left Shoulder Flexion 4/5    Left Shoulder ABduction 4-/5                         [facility] Adult PT Treatment/Exercise - 09/20/19 0001      Transfers   Transfers Floor to Transfer    Comments worked on floor to chair x fer and q ped and tall kneeling positions. worked on lunging onto ther ex pad to prep and squats to warm up knees.  Pt tried to show PT how she thinks she could get up from her side and pushing up with arms.  PT noted that her feet slip out in this position and she does not square up her body with good body mechanics.  Pt also has spasm in thigh during xfrer training.  PT worked with pt to avoid spasms and problem solved ways to help ( ankle pump and not sustaining DF/toe ext in tall kneeling)  Pt was able to xfer from floor to stanidng wiht min A and then practiced being able to rise from floor to q bd to tall kneeling to chair with supervision only , extra time needed to reinforce each progression.      Shoulder Exercises: Standing   Other Standing Exercises wall slides bilat. (alternating side to side) x 10 ea. with pillow case on hands, wall push up and progression to counter push up x 5 each.  Thoracic strech with side to wall and book opening exericise for thoracic stretch.     Other Standing Exercises horizontal abd with red t band 2 x 10 standing and lying down 1 x 10                  PT Education - 09/20/19 1252    Education Details floor to standing and vice versa x fer training. with warm up of LE's    adding to HEP for shoulder iwth wall push up    Person(s) Educated Patient;Caregiver(s)    Methods Explanation;Demonstration;Tactile cues;Verbal cues;Handout    Comprehension Verbalized understanding;Returned demonstration            PT Short Term Goals - 08/23/19 1207      PT SHORT TERM GOAL #1   Title Pt/caregiver with be able to demo proper execution of   initial HEP  Time 3    Status Achieved    Target Date 08/23/19      PT SHORT TERM GOAL #2   Title Pt /caregiver will be educated on FOTO report    Time 3    Period Weeks    Status Achieved    Target Date 08/23/19      PT SHORT TERM GOAL #3   Title Pt will be able to lift UE to 105 without exacerbating pain    Baseline Pt AROM flex to 110 Rt    Time 3    Period Weeks    Status Achieved    Target Date 08/23/19             PT Long Term Goals - 09/20/19 1240      PT LONG TERM GOAL #1   Title Pt will be able to perform advanced HEP with assistance of caregiver and return demo on proper execution    Baseline working on advanced HEP    Time 9    Period Weeks    Status On-going      PT LONG TERM GOAL #2   Title Pain will decrease to 2/10 or less  with all functional activities and ability to sleep on LT shld    Baseline Pt now 0/10 in LT shld, 3/10 thoracic but improves with stretching.    Time 9    Period Weeks    Status On-going      PT LONG TERM GOAL #3   Title LT shoulder AROM scaption will improve to 0-120 degrees for improved overhead reaching and comfort    Baseline LT 122,flex  103 abd    Status Achieved      PT LONG TERM GOAL #4   Title FOTO will improve from 60% limitation   to  42% limitation   indicating improved functional mobility.    Baseline eval 60% limitation  36% limitation    Time 9    Period Weeks    Status Achieved      PT LONG TERM GOAL #5   Title LT shoulder IR and ER will return to [facility] to return to pain-free ADLs such as dressing and grooming.    Baseline able to take shower and dress this morning independently and made bed    Time 9    Period Weeks    Status Achieved                 Plan - 09/20/19 1242    Clinical Impression Statement Ms Rabon enters clinic with more erect posture using rollator.  she expresses fear of falling and much of RX session spend on Floor to standing. Derald Macleod transfer.  Pt was delighted she was  able to achieve floor to chair with only supervision and pleasantly surprised.  She required min A on mat to rise to standing only because she developed a spasm in LEft thigh.  She was shown some lunging and squatting exericises in order to warm up and achieve length in LE's for more easy xfer.  Pt was also given  counter /wall push up to increase strength of shoulders.  Will continue next two visits to address thoracic pain and finalize HEP    Personal Factors and Comorbidities Age;Comorbidity 1;Comorbidity 2    Examination-Activity Limitations Carry;Sleep;Lift;Dressing    PT Frequency 1x / week    PT Duration --   9 weeks   PT Treatment/Interventions ADLs/Self Care Home Management;Cryotherapy;Moist Heat;Ultrasound;Iontophoresis 4mg /ml Dexamethasone;Therapeutic exercise;Therapeutic activities;Neuromuscular re-education;Patient/family  education;Passive range of motion;Manual techniques;Dry needling;Taping;Joint Manipulations    PT Next Visit Plan Finalize HEP for LT shoulder, working on final goals of LTG # 1 and 2 in next two visits may add thoracic book opening for stretch to HEP    PT Home Exercise Plan [id number]  supine scapular stabilizers with yellow t band flex horizongtal abd, diagonals and bil ER. [id number], wall push ups.  thoracic book opening    Consulted and Agree with Plan of Care Patient;Family member/caregiver           Patient will benefit from skilled therapeutic intervention in order to improve the following deficits and impairments:  Decreased range of motion, Decreased strength, Hypomobility, Increased fascial restricitons, Increased muscle spasms, Impaired flexibility, Postural dysfunction, Improper body mechanics, Pain, Impaired UE functional use  Visit Diagnosis: Chronic left shoulder pain  Stiffness of left shoulder, not elsewhere classified  Cervicalgia  Muscle weakness (generalized)  Abnormal posture     Problem List Patient Active Problem List    Diagnosis Date Noted   Dementia without behavioral disturbance ([place]) 07/31/2019   Sick sinus syndrome ([facility]) 07/28/2019   Epigastric pain 06/12/2019   Localized swelling of left lower leg 04/11/2019   Memory loss 04/11/2019   Prediabetes 03/14/2019   Complete heart block ([place]) 03/05/2019   TIA (transient ischemic attack) 01/15/2019   Gait abnormality 01/15/2019   Transient ischemia 12/13/2018   Dysarthria 12/11/2018   Hypothyroidism 12/11/2018   Arthritis of right hand 08/17/2018   Chronic back pain 04/11/2018   Encounter for therapeutic drug monitoring 03/09/2018   Malignant hypertension 01/04/2018   Clavicle fracture 11/20/2017   Closed fracture of clavicle 11/20/2017   Fall    Accelerated hypertension    Carpal tunnel syndrome of left wrist 08/02/2017   Carpal tunnel syndrome of right wrist 08/02/2017   Bradycardia 02/02/2017   Presence of permanent cardiac pacemaker 01/28/2017   Chronic a-fib ([place]) 01/28/2017   Long term current use of anticoagulant therapy 01/28/2017   Chronic venous insufficiency 01/28/2017   Chronic atrial fibrillation ([place]) 01/28/2017   Peripheral venous insufficiency 01/28/2017   Cardiac pacemaker in situ 01/28/2017   Anticoagulated 01/28/2017   DOE (dyspnea on exertion) 09/22/2015   Dyspnea on exertion 09/22/2015   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 05/15/2014   After cataract of left eye not obscuring vision 05/15/2014   Bilateral dry eyes 05/15/2014   Dermatochalasis of eyelid 05/15/2014   Hyperopia of both eyes with astigmatism and presbyopia 05/15/2014   Irregular astigmatism of right eye 05/15/2014   Mechanical complication due to intraocular lens implant 05/15/2014   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 [place], [place], [address] Phone: [phone]   Fax:  [phone]  Name: [name] MRN: [id number] Date of Birth: [birth year]

## 2019-09-27 ENCOUNTER — Ambulatory Visit (INDEPENDENT_AMBULATORY_CARE_PROVIDER_SITE_OTHER): Payer: Medicare Other | Admitting: *Deleted

## 2019-09-27 ENCOUNTER — Ambulatory Visit: Payer: Medicare Other | Admitting: Physical Therapy

## 2019-09-27 ENCOUNTER — Encounter: Payer: Self-pay | Admitting: Physical Therapy

## 2019-09-27 ENCOUNTER — Other Ambulatory Visit: Payer: Self-pay

## 2019-09-27 DIAGNOSIS — M6281 Muscle weakness (generalized): Secondary | ICD-10-CM

## 2019-09-27 DIAGNOSIS — I495 Sick sinus syndrome: Secondary | ICD-10-CM | POA: Diagnosis not present

## 2019-09-27 DIAGNOSIS — M25612 Stiffness of left shoulder, not elsewhere classified: Secondary | ICD-10-CM

## 2019-09-27 DIAGNOSIS — R293 Abnormal posture: Secondary | ICD-10-CM | POA: Diagnosis not present

## 2019-09-27 DIAGNOSIS — G8929 Other chronic pain: Secondary | ICD-10-CM | POA: Diagnosis not present

## 2019-09-27 DIAGNOSIS — M542 Cervicalgia: Secondary | ICD-10-CM | POA: Diagnosis not present

## 2019-09-27 DIAGNOSIS — M25512 Pain in left shoulder: Secondary | ICD-10-CM | POA: Diagnosis not present

## 2019-09-27 NOTE — Therapy (Signed)
[name], [place], [address] Phone: [phone]   Fax:  [phone]  Physical Therapy Treatment  Patient Details  Name: [name] MRN: [id number] Date of Birth: 03/23/1930 Referring Provider (PT): Jennifer Nap MD   Encounter Date: 09/27/2019   PT End of Session - 09/27/19 1120    Visit Number 8    Number of Visits 9    Date for PT Re-Evaluation 10/04/19    Authorization Type BCBS  Medicare   Progress note on 10th visit    PT Start Time 1105    PT Stop Time 1145    PT Time Calculation (min) 40 min           Past Medical History:  Diagnosis Date  . Arrhythmia    chronic atrial fib  . Atrial fibrillation, chronic ([place])   . Chronic anticoagulation   . Chronic atrial fibrillation ([place]) 01/28/2017  . Chronic venous insufficiency 01/28/2017  . Clavicle fracture 11/20/2017  . Complication of anesthesia    " difficult waking "  . Long term current use of anticoagulant therapy 01/28/2017  . Osteoarthritis   . Presence of permanent cardiac pacemaker 01/28/2017   Original implant 1992 Lead Intermedics 431-04 Generator change 2001 and 2011.  Medtronic generator.   . SSS (sick sinus syndrome) ([place])   . Thyroid disease    hypothyroidism  . TIA (transient ischemic attack)     Past Surgical History:  Procedure Laterality Date  . ABDOMINAL HYSTERECTOMY    . INSERT / REPLACE / REMOVE PACEMAKER     generator change 2011 medtronic sigma  . KNEE SURGERY    . LEAD INSERTION N/A 03/05/2019   Procedure: LEAD INSERTION - RV LEAD;  Surgeon: Jennifer Sprang, MD;  Location: [facility];  Service: Cardiovascular;  Laterality: N/A;  . PACEMAKER IMPLANT N/A 03/05/2019   Procedure: PACEMAKER IMPLANT;  Surgeon: Jennifer Sprang, MD;  Location: [facility];  Service: Cardiovascular;  Laterality: N/A;  . PACEMAKER INSERTION    . PARS PLANA VITRECTOMY Right 07/19/2018   Procedure: VITRECTOMY WITH  VITREOUS BIPOSY & ENDOLASER;  Surgeon: Jennifer Mullet, MD;  Location: [facility];  Service: Ophthalmology;  Laterality: Right;  . PPM GENERATOR CHANGEOUT N/A 02/02/2017   Procedure: PPM GENERATOR CHANGEOUT;  Surgeon: Jennifer Sprang, MD;  Location: [facility];  Service: Cardiovascular;  Laterality: N/A;  . REPLACEMENT TOTAL KNEE BILATERAL    . VARICOSE VEIN SURGERY      There were no vitals filed for this visit.   Subjective Assessment - 09/27/19 1108    Subjective No pain now. Still hurts when I reach back.    Pertinent History Bil TKR and  clavicle Fx on LT bil carpal tunnel  PACEMAKER, a fib, OA  See medical chart    Patient Stated Goals I want to be able to move my arm without pain and work in garden    Currently in Pain? No/denies                             [facility] Adult PT Treatment/Exercise - 09/27/19 0001      Shoulder Exercises: Standing   External Rotation AROM;Strengthening;Right;Left;20 reps    Theraband Level (Shoulder External Rotation) Level 2 (Red)    Internal Rotation AROM;Strengthening;Right;Left;20 reps    Theraband Level (Shoulder Internal Rotation) Level 2 (Red)    Flexion Limitations alternating unilat. Theraband "  punch" x 20 ea. bilat. with red Theraband    Extension Strengthening;Both;10 reps;Theraband    Theraband Level (Shoulder Extension) Level 2 (Red)    Row Strengthening;Both;10 reps;Theraband    Theraband Level (Shoulder Row) Level 2 (Red)    Other Standing Exercises wall slides bilat. (alternating side to side) x 10 ea. with pillow case on hands, wall push up and progression to counter push up x 5 each.  Thoracic strech with side to wall and book opening exericise for thoracic stretch.     Other Standing Exercises horizontal abd with red t band 2 x 10 standing and lying down 1 x 10      Manual Therapy   Passive ROM 4-way PROM/manual stretching bilat. shoulders but focus left side                    PT Short Term Goals  - 08/23/19 1207      PT SHORT TERM GOAL #1   Title Pt/caregiver with be able to demo proper execution of  initial HEP    Time 3    Status Achieved    Target Date 08/23/19      PT SHORT TERM GOAL #2   Title Pt /caregiver will be educated on FOTO report    Time 3    Period Weeks    Status Achieved    Target Date 08/23/19      PT SHORT TERM GOAL #3   Title Pt will be able to lift UE to 105 without exacerbating pain    Baseline Pt AROM flex to 110 Rt    Time 3    Period Weeks    Status Achieved    Target Date 08/23/19             PT Long Term Goals - 09/20/19 1240      PT LONG TERM GOAL #1   Title Pt will be able to perform advanced HEP with assistance of caregiver and return demo on proper execution    Baseline working on advanced HEP    Time 9    Period Weeks    Status On-going      PT LONG TERM GOAL #2   Title Pain will decrease to 2/10 or less  with all functional activities and ability to sleep on LT shld    Baseline Pt now 0/10 in LT shld, 3/10 thoracic but improves with stretching.    Time 9    Period Weeks    Status On-going      PT LONG TERM GOAL #3   Title LT shoulder AROM scaption will improve to 0-120 degrees for improved overhead reaching and comfort    Baseline LT 122,flex  103 abd    Status Achieved      PT LONG TERM GOAL #4   Title FOTO will improve from 60% limitation   to  42% limitation   indicating improved functional mobility.    Baseline eval 60% limitation  36% limitation    Time 9    Period Weeks    Status Achieved      PT LONG TERM GOAL #5   Title LT shoulder IR and ER will return to [facility] to return to pain-free ADLs such as dressing and grooming.    Baseline able to take shower and dress this morning independently and made bed    Time 9    Period Weeks    Status Achieved  Plan - 09/27/19 1157    Clinical Impression Statement [name] arrives with rollator and SPC. She used [facility] for ambulation in clinic  with supervision. Continued wit bilateral shoulder ROM and strength per POC. Pt expressed interest in LE strengthening.    PT Next Visit Plan Finalize HEP for LT shoulder, working on final goals of LTG # 1 and 2 in next two visits may add thoracic book opening for stretch to [place] [id number]  supine scapular stabilizers with yellow t band flex horizongtal abd, diagonals and bil ER. [id number], wall push ups.  thoracic book opening           Patient will benefit from skilled therapeutic intervention in order to improve the following deficits and impairments:  Decreased range of motion, Decreased strength, Hypomobility, Increased fascial restricitons, Increased muscle spasms, Impaired flexibility, Postural dysfunction, Improper body mechanics, Pain, Impaired UE functional use  Visit Diagnosis: Stiffness of left shoulder, not elsewhere classified  Chronic left shoulder pain  Muscle weakness (generalized)     Problem List Patient Active Problem List   Diagnosis Date Noted  . Dementia without behavioral disturbance ([place]) 07/31/2019  . Sick sinus syndrome ([place]) 07/28/2019  . Epigastric pain 06/12/2019  . Localized swelling of left lower leg 04/11/2019  . Memory loss 04/11/2019  . Prediabetes 03/14/2019  . Complete heart block ([place]) 03/05/2019  . TIA (transient ischemic attack) 01/15/2019  . Gait abnormality 01/15/2019  . Transient ischemia 12/13/2018  . Dysarthria 12/11/2018  . Hypothyroidism 12/11/2018  . Arthritis of right hand 08/17/2018  . Chronic back pain 04/11/2018  . Encounter for therapeutic drug monitoring 03/09/2018  . Malignant hypertension 01/04/2018  . Clavicle fracture 11/20/2017  . Closed fracture of clavicle 11/20/2017  . Fall   . Accelerated hypertension   . Carpal tunnel syndrome of left wrist 08/02/2017  . Carpal tunnel syndrome of right wrist 08/02/2017  . Bradycardia 02/02/2017  . Presence of permanent cardiac pacemaker  01/28/2017  . Chronic a-fib ([place]) 01/28/2017  . Long term current use of anticoagulant therapy 01/28/2017  . Chronic venous insufficiency 01/28/2017  . Chronic atrial fibrillation ([place]) 01/28/2017  . Peripheral venous insufficiency 01/28/2017  . Cardiac pacemaker in situ 01/28/2017  . Anticoagulated 01/28/2017  . DOE (dyspnea on exertion) 09/22/2015  . Dyspnea on exertion 09/22/2015  . 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 05/15/2014  . After cataract of left eye not obscuring vision 05/15/2014  . Bilateral dry eyes 05/15/2014  . Dermatochalasis of eyelid 05/15/2014  . Hyperopia of both eyes with astigmatism and presbyopia 05/15/2014  . Irregular astigmatism of right eye 05/15/2014  . Mechanical complication due to intraocular lens implant 05/15/2014  . 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 Vitreous syneresis of both eyes 05/15/2014    Dorene Ar, PTA 09/27/2019, 12:01 PM  [place], [place], [address] Phone: [phone]   Fax:  [phone]  Name: [name] MRN: [id number] Date of Birth: [birth year]

## 2019-09-28 LAB — CUP PACEART REMOTE DEVICE CHECK
Battery Remaining Longevity: 149 mo
Battery Voltage: 3.17 V
Brady Statistic RV Percent Paced: 86.1 %
Date Time Interrogation Session: 20210709104106
Implantable Lead Implant Date: 20201214
Implantable Lead Location: 753860
Implantable Lead Model: 1948
Implantable Pulse Generator Implant Date: 20201214
Lead Channel Impedance Value: 418 Ohm
Lead Channel Impedance Value: 589 Ohm
Lead Channel Pacing Threshold Amplitude: 0.875 V
Lead Channel Pacing Threshold Pulse Width: 0.4 ms
Lead Channel Sensing Intrinsic Amplitude: 5.375 mV
Lead Channel Sensing Intrinsic Amplitude: 5.375 mV
Lead Channel Setting Pacing Amplitude: 2.5 V
Lead Channel Setting Pacing Pulse Width: 0.4 ms
Lead Channel Setting Sensing Sensitivity: 1.2 mV

## 2019-10-01 NOTE — Progress Notes (Signed)
Remote pacemaker transmission.   

## 2019-10-02 ENCOUNTER — Other Ambulatory Visit: Payer: Medicare Other

## 2019-10-02 ENCOUNTER — Other Ambulatory Visit: Payer: Self-pay

## 2019-10-02 ENCOUNTER — Ambulatory Visit: Payer: Medicare Other

## 2019-10-02 DIAGNOSIS — G459 Transient cerebral ischemic attack, unspecified: Secondary | ICD-10-CM

## 2019-10-02 DIAGNOSIS — I10 Essential (primary) hypertension: Secondary | ICD-10-CM

## 2019-10-02 NOTE — Progress Notes (Signed)
I have collaborated with the care management provider regarding care management and care coordination activities outlined in this encounter and have reviewed this encounter including documentation in the note and care plan. I am certifying that I agree with the content of this note and encounter as supervising physician.  

## 2019-10-02 NOTE — Patient Instructions (Signed)
October 02, 2019  Dear [name],  It was a pleasure meeting you during our initial appointment on October 02, 2019. Below is a summary of the goals we discussed and components of chronic care management. Please contact me anytime with questions or concerns.   Visit Information  Goals Addressed            This Visit's Progress    Pharmacy Care Plan       CARE PLAN ENTRY  Current Barriers:   Chronic Disease Management support, education, and care coordination needs related to Hypertension and Hyperlipidemia   Hypertension BP Readings from Last 3 Encounters:  07/31/19 (!) 170/80  07/30/19 (!) 168/98  06/19/19 135/72   Pharmacist Clinical Goal(s): o Over the next 7 days, patient will work with PharmD and providers to achieve BP goal <140/90 mmHg  Current regimen:  o Lisinopril 10 mg - 1 tablet daily  Interventions: o Recommend home blood pressure monitoring   Patient self care activities - Over the next 7 days, patient will: o Check blood pressure daily before breakfast, document, and provide at future appointment  Hyperlipidemia Lab Results  Component Value Date/Time   LDLCALC 120 (H) 12/12/2018 08:00 AM   Pharmacist Clinical Goal(s): o Over the next 3 months, patient will work with PharmD and providers to achieve LDL goal < 100  Current regimen:  o Atorvastatin 20 mg - 1 tablet daily in the evening  Interventions: o Comprehensive medication review  Patient self care activities - Over the next 3 months, patient will: o Continue to take medication each day as prescribed  Initial goal documentation      [name] was given information about Chronic Care Management services today including:  1. CCM service includes personalized support from designated clinical staff supervised by her physician, including individualized plan of care and coordination with other care providers 2. 24/7 contact phone numbers for assistance for urgent and routine care  needs. 3. Standard insurance, coinsurance, copays and deductibles apply for chronic care management only during months in which we provide at least 20 minutes of these services. Most insurances cover these services at 100%, however patients may be responsible for any copay, coinsurance and/or deductible if applicable. This service may help you avoid the need for more expensive face-to-face services. 4. Only one practitioner may furnish and bill the service in a calendar month. 5. The patient may stop CCM services at any time (effective at the end of the month) by phone call to the office staff.  Patient agreed to services and verbal consent obtained.   The patient verbalized understanding of instructions provided today and agreed to receive a mailed copy of patient instruction and/or educational materials. Telephone follow up appointment with pharmacy team member scheduled for: 10/08/19 at 11:30 AM telephone call  Debbora Dus, PharmD Clinical Pharmacist [facility] [phone]   Blood Pressure Record Sheet To take your blood pressure, you will need a blood pressure machine. You can buy a blood pressure machine (blood pressure monitor) at your clinic, drug store, or online. When choosing one, consider:  An automatic monitor that has an arm cuff.  A cuff that wraps snugly around your upper arm. You should be able to fit only one finger between your arm and the cuff.  A device that stores blood pressure reading results.  Do not choose a monitor that measures your blood pressure from your wrist or finger. Follow your health care provider's instructions for how  to take your blood pressure. To use this form:  Get one reading in the morning (a.m.) before you take any medicines.  Get one reading in the evening (p.m.) before supper.  Take at least 2 readings with each blood pressure check. This makes sure the results are correct. Wait 1-2 minutes between  measurements.  Write down the results in the spaces on this form.  Repeat this once a week, or as told by your health care provider.  Make a follow-up appointment with your health care provider to discuss the results. Blood pressure log Date: _______________________  a.m. _____________________(1st reading) _____________________(2nd reading)  p.m. _____________________(1st reading) _____________________(2nd reading) Date: _______________________  a.m. _____________________(1st reading) _____________________(2nd reading)  p.m. _____________________(1st reading) _____________________(2nd reading) Date: _______________________  a.m. _____________________(1st reading) _____________________(2nd reading)  p.m. _____________________(1st reading) _____________________(2nd reading) Date: _______________________  a.m. _____________________(1st reading) _____________________(2nd reading)  p.m. _____________________(1st reading) _____________________(2nd reading) Date: _______________________  a.m. _____________________(1st reading) _____________________(2nd reading)  p.m. _____________________(1st reading) _____________________(2nd reading) This information is not intended to replace advice given to you by your health care provider. Make sure you discuss any questions you have with your health care provider. Document Revised: 05/06/2017 Document Reviewed: 03/08/2017 Elsevier Patient Education  [place].

## 2019-10-02 NOTE — Chronic Care Management (AMB) (Signed)
Chronic Care Management Pharmacy  Name: [name]  MRN: [id number] DOB: Feb 14, 1931  Chief Complaint/ HPI  [name],  86 y.o., female presents for their Initial CCM visit with the clinical pharmacist In office. Patient was alone for visit while caregiver remained in waiting area at patient's request. Patient did not bring pill bottles to the appointment. We coordinated a telephone call after patient arrived home from office to verify home medications  Went through each medication with patient and caregiver and had caregiver check patient's blood pressure.  PCP : Jennifer Noe, MD Transitioned care from previous PCP, Dr. Reynaldo [name], 02/2019  Their chronic conditions include: permanent AFIB, HTN, HLD, hypothyroidism, complete heart block, dementia without behavioral disturbance  Patient concerns: denies medication concerns  Office Visits:  07/31/19 PCP visit - Chronic Afib followed by Dr. Wynonia [name], just stopped digoxin. BP elevated, some permissive HTN given history of orthostatic hypotension and falls, improved leg swelling due to switch from amlodipine to lisinopril, recommend home BP monitoring. Dr. Delice [name] follows dementia, planning to start on donepezil if unable to be seen by Dr. Delice [name] sooner. Hypothyroidism, last TSH low and dose decreased, repeat today. Unclear if she is taking right dose.   06/21/19: Telephone call with PCP - stop amlodipine, start lisinopril 10 mg  Consult Visit:  Weekly outpatient rehab for chronic left shoulder pain  07/30/19 [name], Cardiology - Follow up pacemaker implant, history of complete heart block and severe orthostatic hypotension, permanent AFIB. Refer to PT for left shoulder. BP elevated. Stop Digoxin.   07/25/19 [name], Neuropsychologist - Psychotherapy completed, trial of Aricept is warranted. Anticholinergic medication, oxybutynin, may need to be reviewed.  Medications: Outpatient Encounter Medications as of 10/02/2019    Medication Sig  . acetaminophen (TYLENOL) 500 MG tablet Take 1,000 mg by mouth every 8 (eight) hours as needed for moderate pain or headache.   Marland Kitchen apixaban (ELIQUIS) 5 MG TABS tablet Take 1 tablet (5 mg total) by mouth 2 (two) times daily.  Marland Kitchen atorvastatin (LIPITOR) 20 MG tablet TAKE 1 TABLET BY MOUTH EVERY DAY IN THE EVENING  . Carboxymethylcell-Hypromellose (GENTEAL OP) Place 1 drop into both eyes daily as needed (dry eyes).  . Cholecalciferol (VITAMIN D) 50 MCG (2000 UT) tablet Take 1 tablet (2,000 Units total) by mouth daily.  . Cyanocobalamin (B-12) 1000 MCG CAPS TAKE 1 CAPSULE BY MOUTH EVERY DAY  . fluticasone (FLONASE) 50 MCG/ACT nasal spray Place 1 spray into both nostrils daily as needed for allergies or rhinitis.  Marland Kitchen levothyroxine (SYNTHROID) 88 MCG tablet Take 1 tablet by mouth every morning on an empty stomach with water only.  No food or other medications for 30 minutes.  Marland Kitchen lisinopril (ZESTRIL) 10 MG tablet Take 1 tablet (10 mg total) by mouth daily.  Marland Kitchen oxybutynin (DITROPAN-XL) 5 MG 24 hr tablet TAKE 1 TABLET BY MOUTH EVERY DAY  . sertraline (ZOLOFT) 25 MG tablet Take 25 mg by mouth every evening.    No facility-administered encounter medications on file as of 10/02/2019.   Current Diagnosis/Assessment:  Goals Addressed            This Visit's Progress   . Pharmacy Care Plan       CARE PLAN ENTRY  Current Barriers:  . Chronic Disease Management support, education, and care coordination needs related to Hypertension and Hyperlipidemia   Hypertension BP Readings from Last 3 Encounters:  07/31/19 (!) 170/80  07/30/19 (!) 168/98  06/19/19 135/72 .  Pharmacist Clinical Goal(s): o Over the  next 7 days, patient will work with PharmD and providers to achieve BP goal <140/90 mmHg . Current regimen:  o Lisinopril 10 mg - 1 tablet daily . Interventions: o Recommend home blood pressure monitoring  . Patient self care activities - Over the next 7 days, patient will: o Check  blood pressure daily before breakfast, document, and provide at future appointment  Hyperlipidemia Lab Results  Component Value Date/Time   LDLCALC 120 (H) 12/12/2018 08:00 AM .  Pharmacist Clinical Goal(s): o Over the next 3 months, patient will work with PharmD and providers to achieve LDL goal < 100 . Current regimen:  o Atorvastatin 20 mg - 1 tablet daily in the evening . Interventions: o Comprehensive medication review . Patient self care activities - Over the next 3 months, patient will: o Continue to take medication each day as prescribed  Initial goal documentation      AFIB   CBC Latest Ref Rng & Units 05/28/2019 02/13/2019 12/12/2018  WBC 4.0 - 10.5 K/uL 6.2 6.0 6.6  Hemoglobin 12.0 - 15.0 g/dL 12.4 12.4 13.0  Hematocrit 36 - 46 % 37.7 36.8 40.0  Platelets 150 - 400 K/uL 202.0 228 226   Kidney Function Lab Results  Component Value Date/Time   CREATININE 0.99 07/31/2019 10:16 AM   CREATININE 0.99 05/28/2019 12:45 PM   GFR 53.09 (L) 07/31/2019 10:16 AM   GFRNONAA 42 (L) 02/13/2019 12:00 AM   GFRAA 49 (L) 02/13/2019 12:00 AM   K 4.5 07/31/2019 10:16 AM   K 4.6 05/28/2019 12:45 PM   Weight fluctuates < 60 kg, daughter has discussed with cardiology remaining on 5 mg BID despite weight fluctuations as pt is not having any bleeding.  Patient has a pacemaker.  Patient has tried these meds in past: digoxin, diltiazem, amlodipine Patient is currently controlled on the following medications:   Eliquis 5 mg - 1 tablet twice daily  We discussed: denies abnormal bruising or bleeding Adherence: refills timely, however, pt reports she has 4 weeks left and should be running out in 1 week, recommend adherence packaging   Plan: Continue current medications  Hypertension   Office blood pressures are  BP Readings from Last 3 Encounters:  07/31/19 (!) 170/80  07/30/19 (!) 168/98  06/19/19 135/72   Home blood pressure today: 158/92 mmHg, 78 HR (left arm, sitting); 174/94, 66  HR (left arm, sitting after 3-4 minutes) Pt has severe orthostatic hypotension, per cardiology  Patient has failed these meds in the past: amlodipine, diltiazem Patient is currently uncontrolled on the following medications:  . Lisinopril 10 mg - 1 tablet daily  Pt was switched from amlodipine to lisinopril 06/21/19 due to leg swelling Adherence: < 5 day gap between refills  Patient checks BP at home infrequently (sometimes low and sometimes high)  Plan: Continue current medications; Check BP every day for next 7 days, follow up call Monday, 7/19 to assess.   Hyperlipidemia   Lipid Panel     Component Value Date/Time   CHOL 212 (H) 12/12/2018 0800   TRIG 72 12/12/2018 0800   HDL 78 12/12/2018 0800   CHOLHDL 2.7 12/12/2018 0800   VLDL 14 12/12/2018 0800   LDLCALC 120 (H) 12/12/2018 0800    LDL goal < 70 (history of TIA) Patient has failed these meds in past: none Patient is currently uncontrolled on the following medications:  . Atorvastatin 20 mg - 1 tablet in the evening  Adherence: < 5 day gap between refills, confirmed adherence with pill  bottle at home  Would like to confirm adherence with pill packaging prior to considering dose increase as patient seems to have excess supply and use autorefill    Plan: Continue current medications   Dementia   Patient has failed these meds in past: none Patient is currently controlled on the following medications:  . Sertraline 25 mg - 1 tablet in the evening  We discussed: reports outlook and mood are good, doing well  Adherence: < 5 day gap between refills, confirmed adherence with pill bottle at home   Plan: Continue current medications   Hypothyroidism   Lab Results  Component Value Date/Time   TSH 1.45 09/17/2019 10:22 AM   TSH 0.12 (L) 08/14/2019 10:37 AM   FREET4 1.91 (H) 07/31/2019 10:16 AM   FREET4 1.5 06/19/2019 11:42 AM   Patient has failed these meds in past: none Patient is currently controlled on the following  medications:  . Levothyroxine 88 mcg - 1 tablet daily (confirmed taking 88 mcg tablet - green)  We discussed: dose decreased recently, TSH updated in lab today  Plan: Continue current medications   OTCs/Health Maintenance   Patient is currently on the following medications:  OTC: . Tylenol 500 mg - 2 tablets every 8 hrs as needed  . [place] OP - 1 drop into both eyes daily as needed for dry eyes . Flonase 50 mcg/act - 1 spray into both nostrils daily as needed . Vitamin D 2000 units - 1 tablet daily at supper   . Vitamin B12 1000 mcg - 1 capsule daily   Prescription: . Oxybutinin 5 mg - 1 tablet daily   Not taking: . Celecoxib 200 mg  Assessment: Oxybutynin per previous PCP, may consider discontinuation to simplify medications and reduce anticholinergic effects pending PCP evaluation of urinary symptoms  Plan: Continue current medications   Medication Management   Pharmacy/Benefits: [facility] Adherence: Difficult to assess. Patient seems to have excess medications at home demonstrating missed doses. [facility]. She fills her own pillbox 4 weeks in advance. She was able to go through medication list at home and verify all meds with pill bottles and had everything except vitamin B12 which she plans to refill.  We discussed: medication coordination through [employer] for adherence packaging Patient would like to begin pill packaging, but will need to verify with POA, [name].   Follow up: 7 days, telephone call   Debbora Dus, PharmD Clinical Pharmacist [facility] at [facility] [phone]

## 2019-10-03 ENCOUNTER — Other Ambulatory Visit: Payer: Medicare Other

## 2019-10-04 ENCOUNTER — Encounter: Payer: Self-pay | Admitting: Physical Therapy

## 2019-10-04 ENCOUNTER — Other Ambulatory Visit: Payer: Self-pay

## 2019-10-04 ENCOUNTER — Ambulatory Visit: Payer: Medicare Other | Admitting: Physical Therapy

## 2019-10-04 DIAGNOSIS — M542 Cervicalgia: Secondary | ICD-10-CM

## 2019-10-04 DIAGNOSIS — M25512 Pain in left shoulder: Secondary | ICD-10-CM

## 2019-10-04 DIAGNOSIS — G8929 Other chronic pain: Secondary | ICD-10-CM | POA: Diagnosis not present

## 2019-10-04 DIAGNOSIS — M6281 Muscle weakness (generalized): Secondary | ICD-10-CM | POA: Diagnosis not present

## 2019-10-04 DIAGNOSIS — R293 Abnormal posture: Secondary | ICD-10-CM

## 2019-10-04 DIAGNOSIS — M25612 Stiffness of left shoulder, not elsewhere classified: Secondary | ICD-10-CM | POA: Diagnosis not present

## 2019-10-04 NOTE — Therapy (Signed)
[place], [place], [address] Phone: [phone]   Fax:  [phone]  Physical Therapy Treatment/Discharge Note  Patient Details  Name: [name] MRN: [id number] Date of Birth: 08-Feb-1931 Referring Provider (PT): Jolyn Nap MD   Encounter Date: 10/04/2019   PT End of Session - 10/04/19 1113    Visit Number 9    Number of Visits 9    Date for PT Re-Evaluation 10/04/19    Authorization Type BCBS  Medicare   Progress note on 10th visit    PT Start Time 1105    PT Stop Time 1150    PT Time Calculation (min) 45 min    Activity Tolerance Patient tolerated treatment well    Behavior During Therapy [facility] for tasks assessed/performed           Past Medical History:  Diagnosis Date   Arrhythmia    chronic atrial fib   Atrial fibrillation, chronic ([place])    Chronic anticoagulation    Chronic atrial fibrillation ([place]) 01/28/2017   Chronic venous insufficiency 01/28/2017   Clavicle fracture 17/40/8144   Complication of anesthesia    " difficult waking "   Long term current use of anticoagulant therapy 01/28/2017   Osteoarthritis    Presence of permanent cardiac pacemaker 01/28/2017   Original implant 1992 [employer] 431-04 Generator change 2001 and 2011.  Medtronic generator.    SSS (sick sinus syndrome) ([place])    Thyroid disease    hypothyroidism   TIA (transient ischemic attack)     Past Surgical History:  Procedure Laterality Date   ABDOMINAL HYSTERECTOMY     INSERT / REPLACE / REMOVE PACEMAKER     generator change 2011 medtronic sigma   KNEE SURGERY     LEAD INSERTION N/A 03/05/2019   Procedure: LEAD INSERTION - RV LEAD;  Surgeon: Deboraha Sprang, MD;  Location: [facility] [facility];  Service: Cardiovascular;  Laterality: N/A;   PACEMAKER IMPLANT N/A 03/05/2019   Procedure: PACEMAKER IMPLANT;  Surgeon: Deboraha Sprang, MD;  Location: [place] [facility];  Service:  Cardiovascular;  Laterality: N/A;   PACEMAKER INSERTION     PARS PLANA VITRECTOMY Right 07/19/2018   Procedure: VITRECTOMY WITH VITREOUS BIPOSY & ENDOLASER;  Surgeon: Jalene Mullet, MD;  Location: [facility];  Service: Ophthalmology;  Laterality: Right;   PPM GENERATOR CHANGEOUT N/A 02/02/2017   Procedure: PPM GENERATOR CHANGEOUT;  Surgeon: Deboraha Sprang, MD;  Location: [facility];  Service: Cardiovascular;  Laterality: N/A;   REPLACEMENT TOTAL KNEE BILATERAL     VARICOSE VEIN SURGERY      There were no vitals filed for this visit.   Subjective Assessment - 10/04/19 1114    Subjective No pain in my shld now.    Pertinent History Bil TKR and  clavicle Fx on LT bil carpal tunnel  PACEMAKER, a fib, OA  See medical chart    Limitations Lifting    Currently in Pain? No/denies    Pain Score 0-No pain    Pain Location Shoulder    Pain Orientation Left    Pain Descriptors / Indicators Aching    Pain Type Chronic pain    Pain Onset More than a month ago    Pain Frequency Intermittent    Pain Score 0    Pain Location Thoracic    Pain Orientation Mid    Pain Descriptors / Indicators Heaviness   after standing for a very long time/able  to do stretches   Pain Type Chronic pain    Pain Onset More than a month ago    Pain Frequency Intermittent              [facility] PT Assessment - 10/04/19 0001      Assessment   Medical Diagnosis adhesive capsulits LT > RT    Referring Provider (PT) Jolyn Nap MD    Onset Date/Surgical Date 02/23/19   pace maker replaced Rt to LT chest      Observation/Other Assessments   Focus on Therapeutic Outcomes (FOTO)  Intake 70% limtation 30%  predicted 42%      AROM   Right Shoulder Flexion 125 Degrees    Right Shoulder ABduction 110 Degrees    Left Shoulder Flexion 124 Degrees    Left Shoulder ABduction 111 Degrees      PROM   Right Shoulder Flexion 125 Degrees    Left Shoulder Flexion 126 Degrees      Strength   Overall Strength  Deficits    Right Shoulder Flexion 4/5    Right Shoulder ABduction 4/5    Left Shoulder Flexion 4/5    Left Shoulder ABduction 4/5                         [facility] Adult PT Treatment/Exercise - 10/04/19 0001      Self-Care   Self-Care Posture;Other Self-Care Comments    Posture continue reinforcement of  posture for standing and sitting/endurance    Other Self-Care Comments  community wellness/ leisure activities for strength and mobility      Shoulder Exercises: Standing   External Rotation AROM;Strengthening;Right;Left;20 reps    Theraband Level (Shoulder External Rotation) Level 2 (Red)    Internal Rotation AROM;Strengthening;Right;Left;20 reps    Theraband Level (Shoulder Internal Rotation) Level 2 (Red)    Flexion Limitations alternating unilat. Theraband "punch" x 20 ea. bilat. with red Theraband    Extension Strengthening;Both;10 reps;Theraband    Theraband Level (Shoulder Extension) Level 2 (Red)    Row Strengthening;Both;10 reps;Theraband    Theraband Level (Shoulder Row) Level 2 (Red)    Other Standing Exercises wall slides bilat. (alternating side to side) x 10 ea. with pillow case on hands, wall push up and progression to counter push up x 5 each.  Thoracic strech with side to wall and book opening exericise for thoracic stretch.       Manual Therapy   Manual Therapy Passive ROM    Passive ROM 4-way PROM/manual stretching bilat. shoulders but focus left side                  PT Education - 10/04/19 1240    Education Details communtity wellness and leisure activities for strenght  Review of HEP with caregiver [name]    Person(s) Educated Patient;Caregiver(s)    Methods Explanation;Demonstration    Comprehension Verbalized understanding;Returned demonstration            PT Short Term Goals - 08/23/19 1207      PT SHORT TERM GOAL #1   Title Pt/caregiver with be able to demo proper execution of  initial HEP    Time 3    Status Achieved     Target Date 08/23/19      PT SHORT TERM GOAL #2   Title Pt /caregiver will be educated on FOTO report    Time 3    Period Weeks    Status Achieved    Target  Date 08/23/19      PT SHORT TERM GOAL #3   Title Pt will be able to lift UE to 105 without exacerbating pain    Baseline Pt AROM flex to 110 Rt    Time 3    Period Weeks    Status Achieved    Target Date 08/23/19             PT Long Term Goals - 10/04/19 1121      PT LONG TERM GOAL #1   Title Pt will be able to perform advanced HEP with assistance of caregiver and return demo on proper execution    Baseline able to do  advanced HEP with caregivers    Time 9    Period Weeks    Status Achieved      PT LONG TERM GOAL #2   Title Pain will decrease to 2/10 or less  with all functional activities and ability to sleep on LT shld    Baseline Pt now 0/10 in LT shld,0/110 thoracic but improves with stretching.    Time 9    Period Weeks    Status Achieved      PT LONG TERM GOAL #3   Title LT shoulder AROM scaption will improve to 0-120 degrees for improved overhead reaching and comfort    Baseline LT 124,flex  111 abd    Time 9    Period Weeks    Status Achieved      PT LONG TERM GOAL #4   Title FOTO will improve from 60% limitation   to  42% limitation   indicating improved functional mobility.    Baseline eval 60% limitation  36% limitation  30% lmitation on DC    Time 9    Period Weeks    Status Achieved      PT LONG TERM GOAL #5   Title LT shoulder IR and ER will return to [facility] to return to pain-free ADLs such as dressing and grooming.    Baseline able to take shower and dress  independently but has caregivers in home    Time 9    Period Weeks    Status Achieved                 Plan - 10/04/19 1231    Clinical Impression Statement [name] has exceeded all goals set at eval.  She now is able to walk with cane/AD and walk with better posture, She still has a caregiver for safety and memory  issues.but is able to perform a floor /to stand transfer with supervision/minimal assistance as needed.  PT AROM LT shoulder flex 124 /abd 111 and was able to demo with caregiver assistance/prompting. All LTG's were achieved.  FOTO limiation far exceed goal or 42 % limitation to 30% liimiation even with kyphosis. Pt also utilizes sink squat for UE AROM but also LE strength.  Pt was able to raise 8lb weight above head.  [name] will benefit from following HEP and continuing walking/community wellness for strengthening. Will DC due to achievement of all goals. [name] was a delight for whom to serve.    Personal Factors and Comorbidities Age;Comorbidity 1;Comorbidity 2    Examination-Activity Limitations Carry;Sleep;Lift;Dressing    PT Frequency 1x / week    PT Duration --   9 weeks   PT Treatment/Interventions ADLs/Self Care Home Management;Cryotherapy;Moist Heat;Ultrasound;Iontophoresis 66m/ml Dexamethasone;Therapeutic exercise;Therapeutic activities;Neuromuscular re-education;Patient/family education;Passive range of motion;Manual techniques;Dry needling;Taping;Joint Manipulations    PT Next Visit Plan  DC    PT Home Exercise Plan [id number]  supine scapular stabilizers with yellow t band flex horizongtal abd, diagonals and bil ER. [id number], wall push ups.  thoracic book opening    Consulted and Agree with Plan of Care Patient;Family member/caregiver   [name] caregiver          Patient will benefit from skilled therapeutic intervention in order to improve the following deficits and impairments:  Decreased range of motion, Decreased strength, Hypomobility, Increased fascial restricitons, Increased muscle spasms, Impaired flexibility, Postural dysfunction, Improper body mechanics, Pain, Impaired UE functional use  Visit Diagnosis: Stiffness of left shoulder, not elsewhere classified  Chronic left shoulder pain  Muscle weakness (generalized)  Cervicalgia  Abnormal  posture     Problem List Patient Active Problem List   Diagnosis Date Noted   Dementia without behavioral disturbance ([place]) 07/31/2019   Sick sinus syndrome ([place]) 07/28/2019   Epigastric pain 06/12/2019   Localized swelling of left lower leg 04/11/2019   Memory loss 04/11/2019   Prediabetes 03/14/2019   Complete heart block ([place]) 03/05/2019   TIA (transient ischemic attack) 01/15/2019   Gait abnormality 01/15/2019   Transient ischemia 12/13/2018   Dysarthria 12/11/2018   Hypothyroidism 12/11/2018   Arthritis of right hand 08/17/2018   Chronic back pain 04/11/2018   Encounter for therapeutic drug monitoring 03/09/2018   Malignant hypertension 01/04/2018   Clavicle fracture 11/20/2017   Closed fracture of clavicle 11/20/2017   Fall    Accelerated hypertension    Carpal tunnel syndrome of left wrist 08/02/2017   Carpal tunnel syndrome of right wrist 08/02/2017   Bradycardia 02/02/2017   Presence of permanent cardiac pacemaker 01/28/2017   Chronic a-fib ([place]) 01/28/2017   Long term current use of anticoagulant therapy 01/28/2017   Chronic venous insufficiency 01/28/2017   Chronic atrial fibrillation ([place]) 01/28/2017   Peripheral venous insufficiency 01/28/2017   Cardiac pacemaker in situ 01/28/2017   Anticoagulated 01/28/2017   DOE (dyspnea on exertion) 09/22/2015   Dyspnea on exertion 09/22/2015   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 05/15/2014   After cataract of left eye not obscuring vision 05/15/2014   Bilateral dry eyes 05/15/2014   Dermatochalasis of eyelid 05/15/2014   Hyperopia of both eyes with astigmatism and presbyopia 05/15/2014   Irregular astigmatism of right eye 05/15/2014   Mechanical complication due to intraocular lens implant 05/15/2014   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   Vitreous syneresis of both eyes 05/15/2014    [name], PT Certified Exercise Expert for the Aging Adult  10/04/19 12:44 PM Phone:  [phone] Fax: [facility] [address] [place], [place], [address] Phone: [phone]   Fax:  [phone]  Name: [name] MRN: [id number] Date of Birth: January 29, 1931   PHYSICAL THERAPY DISCHARGE SUMMARY  Visits from Start of Care: 9  Current functional level related to goals / functional outcomes: As above   Remaining deficits: Pt with kyphosis and has maxed AROM of left shld 125    Education / Equipment: HEP  Able to do floor to stand x fer with supervision/ min A Plan: Patient agrees to discharge.  Patient goals were met. Patient is being discharged due to meeting the stated rehab goals.  ?????    And is pleased with current level of function  [name], PT Certified Exercise Expert for the Aging Adult  10/04/19 12:46 PM Phone: [phone] Fax: [phone]

## 2019-10-05 ENCOUNTER — Other Ambulatory Visit: Payer: Self-pay | Admitting: Family Medicine

## 2019-10-08 ENCOUNTER — Ambulatory Visit: Payer: Medicare Other | Admitting: Family Medicine

## 2019-10-08 ENCOUNTER — Telehealth: Payer: Medicare Other

## 2019-10-08 DIAGNOSIS — Z0289 Encounter for other administrative examinations: Secondary | ICD-10-CM

## 2019-10-11 ENCOUNTER — Ambulatory Visit (INDEPENDENT_AMBULATORY_CARE_PROVIDER_SITE_OTHER): Payer: Medicare Other | Admitting: Neurology

## 2019-10-11 ENCOUNTER — Encounter: Payer: Self-pay | Admitting: Neurology

## 2019-10-11 ENCOUNTER — Other Ambulatory Visit: Payer: Self-pay

## 2019-10-11 VITALS — BP 168/82 | HR 78 | Ht 63.0 in | Wt 144.6 lb

## 2019-10-11 DIAGNOSIS — F0151 Vascular dementia with behavioral disturbance: Secondary | ICD-10-CM

## 2019-10-11 DIAGNOSIS — F01518 Vascular dementia, unspecified severity, with other behavioral disturbance: Secondary | ICD-10-CM

## 2019-10-11 MED ORDER — SERTRALINE HCL 50 MG PO TABS: 50.0000 mg | ORAL_TABLET | Freq: Every day | ORAL | 11 refills | Status: DC

## 2019-10-11 MED ORDER — DONEPEZIL HCL 10 MG PO TABS: ORAL_TABLET | ORAL | 11 refills | Status: DC

## 2019-10-11 NOTE — Patient Instructions (Signed)
Good to see you. All of the testing we have done, including the brain scan, indicate a brain condition called dementia. I would like to start a medication to help slow down worsening of memory, called Donepezil. Start Donepezil 10mg  1/2 tablet daily for 2 weeks, then increase to 1 tablet daily. We will also increase the Sertraline dose to 50mg  daily. Start using pillpack as recommended.  No further driving as per [place] driving restrictions for patients with dementia.  Follow-up in 6 months, call for any changes.  FALL PRECAUTIONS: Be cautious when walking. Scan the area for obstacles that may increase the risk of trips and falls. When getting up in the mornings, sit up at the edge of the bed for a few minutes before getting out of bed. Consider elevating the bed at the head end to avoid drop of blood pressure when getting up. Walk always in a well-lit room (use night lights in the walls). Avoid area rugs or power cords from appliances in the middle of the walkways. Use a walker or a cane if necessary and consider physical therapy for balance exercise. Get your eyesight checked regularly.  FINANCIAL OVERSIGHT: Supervision, especially oversight when making financial decisions or transactions is also recommended.  HOME SAFETY: Consider the safety of the kitchen when operating appliances like stoves, microwave oven, and blender. Consider having supervision and share cooking responsibilities until no longer able to participate in those. Accidents with firearms and other hazards in the house should be identified and addressed as well.   ABILITY TO BE LEFT ALONE: If patient is unable to contact 911 operator, consider using LifeLine, or when the need is there, arrange for someone to stay with patients. Smoking is a fire hazard, consider supervision or cessation. Risk of wandering should be assessed by caregiver and if detected at any point, supervision and safe proof recommendations should be  instituted.  MEDICATION SUPERVISION: Inability to self-administer medication needs to be constantly addressed. Implement a mechanism to ensure safe administration of the medications.  RECOMMENDATIONS FOR ALL PATIENTS WITH MEMORY PROBLEMS: 1. Continue to exercise (Recommend 30 minutes of walking everyday, or 3 hours every week) 2. Increase social interactions - continue going to [place] and enjoy social gatherings with friends and family 3. Eat healthy, avoid fried foods and eat more fruits and vegetables 4. Maintain adequate blood pressure, blood sugar, and blood cholesterol level. Reducing the risk of stroke and cardiovascular disease also helps promoting better memory. 5. Avoid stressful situations. Live a simple life and avoid aggravations. Organize your time and prepare for the next day in anticipation. 6. Sleep well, avoid any interruptions of sleep and avoid any distractions in the bedroom that may interfere with adequate sleep quality 7. Avoid sugar, avoid sweets as there is a strong link between excessive sugar intake, diabetes, and cognitive impairment The Mediterranean diet has been shown to help patients reduce the risk of progressive memory disorders and reduces cardiovascular risk. This includes eating fish, eat fruits and green leafy vegetables, nuts like almonds and hazelnuts, walnuts, and also use olive oil. Avoid fast foods and fried foods as much as possible. Avoid sweets and sugar as sugar use has been linked to worsening of memory function.  There is always a concern of gradual progression of memory problems. If this is the case, then we may need to adjust level of care according to patient needs. Support, both to the patient and caregiver, should then be put into place.

## 2019-10-11 NOTE — Progress Notes (Signed)
NEUROLOGY FOLLOW UP OFFICE NOTE  Jennifer ZAVALETA [id number] 10-11-1930  HISTORY OF PRESENT ILLNESS: I had the pleasure of seeing [name] in follow-up in the neurology clinic on 10/11/2019.  The patient was last seen 4 months ago for memory loss. She is accompanied by her daughter who helps supplement the history today.  Records and images were personally reviewed where available.  I personally reviewed head CT without contrast which showed moderate cerebral atrophy, chronic microvascular disease. 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 She had anosognosia with lack of insight into her condition, and felt incapable of managing high-level affairs. She was advised not to drive. Her daughter contacted our office last month to report that she is about to lose her caregiver due to poor behavior. She states she is a little forgetful of her medications, aides remind her. She will be switching to [employer]. She has [facility] coming twice a day, she is only alone from 4pm to 8pm. She states "my daughter thinks I'm losing my mind." Her daughter provides additional information that she had noticed decline for the past 3-4 years with some skills, including math and writing. Her daughter manages finances. The patient pays some of the bills that are not on autopay. She had missed her taxes and states this was a misunderstanding, that she thought her daughter would take care of it. Her organizational skills are not nearly as good. She has gotten a little combative and wants to prove she can take care of herself. She gest aggravated when told she is not using her good sense, "I think I am." She has difficulties with sequencing of events, she got very, very upset today because she was  supposed to have friends for lunch but had her appointment. No hallucinations. She reports her whole body hurts, she gets injections in her wrists for carpal tunnel syndrome. Sleep is good. She reports a problem with her nose for years, she has congestion on the left which makes her cough, waking her up to use a nasal spray.    History on Initial Assessment 06/19/2019: This is an 86 year old right-handed woman with a history of hypertension, hyperlipidemia, hypothyroidism, atrial fibrillation s/p PPM on anticoagulation with Eliquis, presenting for evaluation of memory loss. She is accompanied by her aide [name] who has only been with her for 2 months. I attempted to contact her daughter for more information, unable to reach her. She states she knows her memory is not good. She gets ready to call a friend and cannot think of their name. She had been living alone until Christmas 2019 after she had several falls. She stayed at [facility] then moved to a different home with almost 24/7 care ([facility]). She is alone from 4 to 8pm. Her daughter/POA lives in [place]. She has a son in [place]. Her daughter manages her checkbook, which aggravates her because she does not not what is going on. She reports she was good with bill payments. She stopped driving 2 years ago, stating her daughter just decided she did not want the patient to drive. She manages her own medications, she fixes 4 boxes every week, aides watch her and report she does well with this. She denies misplacing things and does her own cooking at night. She denies leaving the stove on. [name] reports  that she is mostly just forgetful, good with her medications. She is independent with dressing and bathing. No paranoia or hallucinations. She reports a fall when she pulled on a limb, then says "my daughter said it's not true."   PCP notes from 04/11/19 reviewed: "Pt does not seem to have overt symptoms and does have care givers to help her at home.  Family dynamic seems challenging - daughter out of state who has one opinion of patient's capability vs care takers who feel she is fairly active and does a good job managing her medications. Discussed neurology referral for further evaluation of noted memory changes, but also encouraged her contact social services if she feels she should have more control - that if work-up shows good mental ability she could regain control of her own finances."  She denies any headaches, neck/back pain, focal tingling/weakness, bladder dysfunction, anosmia, tremors. She feels her eyes are blurred when her eyelids are heavy, sometimes making her feel dizzy. She denies any falls in the past 4 months. Sleep is okay. She has swelling in both legs. Her left leg has felt weaker for the past 2-3 months. She has tingling in her fingers.   PAST MEDICAL HISTORY: Past Medical History:  Diagnosis Date  . Arrhythmia    chronic atrial fib  . Atrial fibrillation, chronic ([place])   . Chronic anticoagulation   . Chronic atrial fibrillation ([place]) 01/28/2017  . Chronic venous insufficiency 01/28/2017  . Clavicle fracture 11/20/2017  . Complication of anesthesia    " difficult waking "  . Long term current use of anticoagulant therapy 01/28/2017  . Osteoarthritis   . Presence of permanent cardiac pacemaker 01/28/2017   Original implant 1992 Lead [employer]medics 431-04 Generator change 2001 and 2011.  Medtronic generator.   . SSS (sick sinus syndrome) ([place])   . Thyroid disease    hypothyroidism  . TIA (transient ischemic attack)     MEDICATIONS: Current Outpatient Medications on File Prior to Visit  Medication Sig Dispense Refill  . acetaminophen (TYLENOL) 500 MG tablet Take 1,000 mg by mouth every 8 (eight) hours as needed for moderate pain or headache.     Marland Kitchen atorvastatin (LIPITOR) 20 MG tablet TAKE 1 TABLET BY MOUTH EVERY DAY IN THE EVENING 90 tablet 1  . Carboxymethylcell-Hypromellose (GENTEAL OP) Place 1 drop into both eyes  daily as needed (dry eyes).    . Cholecalciferol (VITAMIN D) 50 MCG (2000 UT) tablet Take 1 tablet (2,000 Units total) by mouth daily. 90 tablet 3  . Cyanocobalamin (B-12) 1000 MCG CAPS TAKE 1 CAPSULE BY MOUTH EVERY DAY 30 capsule 35  . ELIQUIS 5 MG TABS tablet TAKE 1 TABLET BY MOUTH TWICE A DAY 60 tablet 2  . fluticasone (FLONASE) 50 MCG/ACT nasal spray Place 1 spray into both nostrils daily as needed for allergies or rhinitis.    Marland Kitchen levothyroxine (SYNTHROID) 88 MCG tablet Take 1 tablet by mouth every morning on an empty stomach with water only.  No food or other medications for 30 minutes. 90 tablet 0  . lisinopril (ZESTRIL) 10 MG tablet Take 1 tablet (10 mg total) by mouth daily. 90 tablet 3  . oxybutynin (DITROPAN-XL) 5 MG 24 hr tablet TAKE 1 TABLET BY MOUTH EVERY DAY 90 tablet 2  . sertraline (ZOLOFT) 25 MG tablet Take 25 mg by mouth every evening.      No current facility-administered medications on file prior to visit.    ALLERGIES: Allergies  Allergen Reactions  . Adhesive [Tape]  Other (See Comments)    TAPE WILL TEAR THE SKIN!!!!  . Penicillins Hives and Rash    Did it involve swelling of the face/tongue/throat, SOB, or low BP? No Did it involve sudden or severe rash/hives, skin peeling, or any reaction on the inside of your mouth or nose? Yes Did you need to seek medical attention at a hospital or doctor's office? Unknown  When did it last happen?unknown  If all above answers are "NO", may proceed with cephalosporin use.     FAMILY HISTORY: Family History  Problem Relation Age of Onset  . Diabetes Mother   . Mental illness Mother   . Diabetes Father   . Heart attack Father     SOCIAL HISTORY: Social History   Socioeconomic History  . Marital status: Widowed    Spouse name: Not on file  . Number of children: 2  . Years of education: 2- year college  . Highest education level: Not on file  Occupational History  . Not on file  Tobacco Use  . Smoking status:  Former Smoker    Packs/day: 0.25    Years: 10.00    Pack years: 2.50    Types: Cigarettes    Quit date: 1980    Years since quitting: 41.5  . Smokeless tobacco: Never Used  Vaping Use  . Vaping Use: Never used  Substance and Sexual Activity  . Alcohol use: No  . Drug use: No  . Sexual activity: Never  Other Topics Concern  . Not on file  Social History Narrative   03/14/19   From: the area   Living: alone, but caretaker that - 20 hours a day of care givers, is alone from 4-8 pm   Work: retired -    Widowed: husband died from [place], used to run a golf course      Family: 2 children - [name] ([place]), Marcello Moores (lives nearby), has grandchildren and Designer, industrial/product      Enjoys: play golf (not as much), painting, crafting    Right handed      Right handed   Exercise: yard work   Diet: anything, whatever she can find      Land belts: Yes    Guns: No   Safe in relationships: Yes    Social Determinants of Radio broadcast assistant Strain:   . Difficulty of Paying Living Expenses:   Food Insecurity:   . Worried About Charity fundraiser in the Last Year:   . Arboriculturist in the Last Year:   Transportation Needs:   . Film/video editor (Medical):   Marland Kitchen Lack of Transportation (Non-Medical):   Physical Activity:   . Days of Exercise per Week:   . Minutes of Exercise per Session:   Stress:   . Feeling of Stress :   Social Connections:   . Frequency of Communication with Friends and Family:   . Frequency of Social Gatherings with Friends and Family:   . Attends Religious Services:   . Active Member of Clubs or Organizations:   . Attends Archivist Meetings:   Marland Kitchen Marital Status:   Intimate Partner Violence:   . Fear of Current or Ex-Partner:   . Emotionally Abused:   Marland Kitchen Physically Abused:   . Sexually Abused:     PHYSICAL EXAM: Vitals:   10/11/19 1005  BP: (!) 168/82  Pulse: 78  SpO2: 98%   General: No acute  distress Head:  Normocephalic/atraumatic Skin/Extremities: No rash, no edema Neurological Exam: alert and oriented to person, place, and time. No aphasia or dysarthria. Fund of knowledge is reduced. Recent and remote memory impaired. Attention and concentration are normal. Cranial nerves: Pupils equal, round, reactive to light. Extraocular movements intact with no nystagmus. Visual fields full.  No facial asymmetry.Motor: Bulk and tone normal, muscle strength 5/5 throughout with no pronator drift.  Finger to nose testing intact.  Gait narrow-based and steady, no ataxia.   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 Head CT no acute changes, there is moderate diffuse atrophy and chronic microvascular disease. Findings discussed at length with the patient and her daughter today, she has minimal insight into her condition. Discussed medications used in dementia, she is agreeable to starting Donepezil, side effects and expectations from medication discussed. Start Donepezil 10mg  1/2 tablet daily for 2 weeks, then increase to 1 tab daily. Agree with pillpack to help with compliance. She would also benefit from increasing Sertraline to 50mg  daily for mood changes associated with dementia. Continue close supervision. No further driving. Follow-up in 6 months, they know to call for any changes.   Thank you for allowing me to participate in her care.  Please do not hesitate to call for any questions or concerns.   Ellouise Newer, M.D.   CC: Dr. Einar Pheasant

## 2019-10-25 DIAGNOSIS — H52203 Unspecified astigmatism, bilateral: Secondary | ICD-10-CM | POA: Diagnosis not present

## 2019-10-25 DIAGNOSIS — Z961 Presence of intraocular lens: Secondary | ICD-10-CM | POA: Diagnosis not present

## 2019-10-25 DIAGNOSIS — H43812 Vitreous degeneration, left eye: Secondary | ICD-10-CM | POA: Diagnosis not present

## 2019-10-26 ENCOUNTER — Telehealth: Payer: Self-pay

## 2019-10-26 ENCOUNTER — Telehealth: Payer: Medicare Other

## 2019-10-26 NOTE — Progress Notes (Signed)
Chronic Care Management Pharmacy Assistant   Name: [name]  MRN: [id number] DOB: 1930/07/10  Reason for Encounter: Medication Review/ General Adherence Call  Patient Questions:  1.  Have you seen any other providers since your last visit? Yes, OV with Neruology  2.  Any changes in your medicines or health? Yes, Increased Sertraline to 50 mg from 25 mg    PCP : [name], MD  Allergies:   Allergies  Allergen Reactions  . Adhesive [Tape] Other (See Comments)    TAPE WILL TEAR THE SKIN!!!!  . Penicillins Hives and Rash    Did it involve swelling of the face/tongue/throat, SOB, or low BP? No Did it involve sudden or severe rash/hives, skin peeling, or any reaction on the inside of your mouth or nose? Yes Did you need to seek medical attention at a hospital or doctor's office? Unknown  When did it last happen?unknown  If all above answers are "NO", may proceed with cephalosporin use.     Medications: Outpatient Encounter Medications as of 10/26/2019  Medication Sig  . acetaminophen (TYLENOL) 500 MG tablet Take 1,000 mg by mouth every 8 (eight) hours as needed for moderate pain or headache.   Marland Kitchen atorvastatin (LIPITOR) 20 MG tablet TAKE 1 TABLET BY MOUTH EVERY DAY IN THE EVENING  . Carboxymethylcell-Hypromellose (GENTEAL OP) Place 1 drop into both eyes daily as needed (dry eyes).  . Cholecalciferol (VITAMIN D) 50 MCG (2000 UT) tablet Take 1 tablet (2,000 Units total) by mouth daily.  . Cyanocobalamin (B-12) 1000 MCG CAPS TAKE 1 CAPSULE BY MOUTH EVERY DAY  . donepezil (ARICEPT) 10 MG tablet Take 1/2 tablet daily for 2 weeks, then increase to 1 tablet daily  . ELIQUIS 5 MG TABS tablet TAKE 1 TABLET BY MOUTH TWICE A DAY  . fluticasone (FLONASE) 50 MCG/ACT nasal spray Place 1 spray into both nostrils daily as needed for allergies or rhinitis.  Marland Kitchen levothyroxine (SYNTHROID) 88 MCG tablet Take 1 tablet by mouth every morning on an empty stomach with water only.  No  food or other medications for 30 minutes.  Marland Kitchen lisinopril (ZESTRIL) 10 MG tablet Take 1 tablet (10 mg total) by mouth daily.  Marland Kitchen oxybutynin (DITROPAN-XL) 5 MG 24 hr tablet TAKE 1 TABLET BY MOUTH EVERY DAY  . sertraline (ZOLOFT) 50 MG tablet Take 1 tablet (50 mg total) by mouth daily.   No facility-administered encounter medications on file as of 10/26/2019.    Current Diagnosis: Patient Active Problem List   Diagnosis Date Noted  . Dementia without behavioral disturbance ([place]) 07/31/2019  . Sick sinus syndrome ([place]) 07/28/2019  . Epigastric pain 06/12/2019  . Localized swelling of left lower leg 04/11/2019  . Memory loss 04/11/2019  . Prediabetes 03/14/2019  . Complete heart block ([place]) 03/05/2019  . TIA (transient ischemic attack) 01/15/2019  . Gait abnormality 01/15/2019  . Transient ischemia 12/13/2018  . Dysarthria 12/11/2018  . Hypothyroidism 12/11/2018  . Arthritis of right hand 08/17/2018  . Chronic back pain 04/11/2018  . Encounter for therapeutic drug monitoring 03/09/2018  . Malignant hypertension 01/04/2018  . Clavicle fracture 11/20/2017  . Closed fracture of clavicle 11/20/2017  . Fall   . Accelerated hypertension   . Carpal tunnel syndrome of left wrist 08/02/2017  . Carpal tunnel syndrome of right wrist 08/02/2017  . Bradycardia 02/02/2017  . Presence of permanent cardiac pacemaker 01/28/2017  . Chronic a-fib ([place]) 01/28/2017  . Long term current use of anticoagulant therapy 01/28/2017  .  Chronic venous insufficiency 01/28/2017  . Chronic atrial fibrillation ([place]) 01/28/2017  . Peripheral venous insufficiency 01/28/2017  . Cardiac pacemaker in situ 01/28/2017  . Anticoagulated 01/28/2017  . DOE (dyspnea on exertion) 09/22/2015  . Dyspnea on exertion 09/22/2015  . 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 05/15/2014  . After cataract of left eye not obscuring vision 05/15/2014  . Bilateral dry eyes 05/15/2014  . Dermatochalasis of eyelid 05/15/2014  . Hyperopia  of both eyes with astigmatism and presbyopia 05/15/2014  . Irregular astigmatism of right eye 05/15/2014  . Mechanical complication due to intraocular lens implant 05/15/2014  . 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 Vitreous syneresis of both eyes 05/15/2014    Goals Addressed   None     Follow-Up:  Coordination of Enhanced Pharmacy Services   Reached out to Gearldine Shown. [place] to discuss medication adherence. She doesn't report any concerns with her medications at this time. Rescheduled appointment to 01/28/2020 at 1:00PM.   Martinique Uselman, [place] Pharmacist Assistant  [phone]

## 2019-10-29 ENCOUNTER — Telehealth: Payer: Self-pay

## 2019-10-29 NOTE — Telephone Encounter (Addendum)
[name] (DPR signed) left v/m that pt got phone call from Dr Cody's office and pt did not understand instructions given about medications and who and when pt has appt with. Per chart review note there was a call on 10/26/19 with Debbora Dus. Will send note to Emeterio Reeve when gets her contact info. Left v/m on [phone] requesting cb to get Jomarie Longs name and contact info for this call. Sharyn Lull teamed [employer] me and asked if she could help and I am sending note to [place]. Selinda Eon teamed message me again and asked if I could also send to [employer]. I said yes but I need her last name. Michelle replied [employer]. Sending the note to Claudine Mouton Brown.;Sara Beth called [facility] and left v/m with contact #s and I called and spoke with Martinique who is Edmund Hilda asst. And she said she would give pts daughter a call; Edmund Hilda schedule this afternoon was full.

## 2019-10-29 NOTE — Progress Notes (Signed)
[name] called today for confusion about appointment that was rescheduled on Friday 10/26/19. She was very confused and thought that I had cancelled appointment for [name], however, I assured her she was only rescheduled for [name]'s appointment with her on 10/26/19. Patient voiced understanding that the only appointment changed was Pharmacy call that is scheduled to take place on January 28, 2020 at 1:00 PM.  [name], [place] Pharmacist Assistant  [phone]

## 2019-10-30 ENCOUNTER — Ambulatory Visit (HOSPITAL_COMMUNITY): Payer: Medicare Other | Attending: Cardiology

## 2019-10-30 ENCOUNTER — Other Ambulatory Visit: Payer: Self-pay

## 2019-10-30 DIAGNOSIS — I350 Nonrheumatic aortic (valve) stenosis: Secondary | ICD-10-CM | POA: Diagnosis not present

## 2019-10-30 LAB — ECHOCARDIOGRAM COMPLETE
AR max vel: 0.57 cm2
AV Area VTI: 0.48 cm2
AV Area mean vel: 0.54 cm2
AV Mean grad: 29 mmHg
AV Peak grad: 44.1 mmHg
Ao pk vel: 3.32 m/s
Area-P 1/2: 5.2 cm2
MV M vel: 6.37 m/s
MV Peak grad: 162.3 mmHg
P 1/2 time: 328 msec
Radius: 0.65 cm
S' Lateral: 2.5 cm

## 2019-10-31 NOTE — Progress Notes (Signed)
Pharmacist spoke to patient to discuss packaging and delivery option. Patient is not ready at this time. Patient states that she will call Martinique or Emeterio Reeve back if she is ready before Sharyn Lull returns to work. Patient states she just filled another month of medications and has filled up her pill box at home. Pharmacist encouraged patient to call with any questions or concerns with her medication.   Sherre Poot, PharmD, [facility] Clinical Pharmacist [facility] [phone] (office) [phone] (mobile)

## 2019-11-01 DIAGNOSIS — G5602 Carpal tunnel syndrome, left upper limb: Secondary | ICD-10-CM | POA: Diagnosis not present

## 2019-11-01 DIAGNOSIS — G5601 Carpal tunnel syndrome, right upper limb: Secondary | ICD-10-CM | POA: Diagnosis not present

## 2019-11-01 DIAGNOSIS — G5603 Carpal tunnel syndrome, bilateral upper limbs: Secondary | ICD-10-CM | POA: Diagnosis not present

## 2019-11-05 ENCOUNTER — Other Ambulatory Visit: Payer: Self-pay | Admitting: Primary Care

## 2019-11-05 DIAGNOSIS — E039 Hypothyroidism, unspecified: Secondary | ICD-10-CM

## 2019-11-07 DIAGNOSIS — Z961 Presence of intraocular lens: Secondary | ICD-10-CM | POA: Diagnosis not present

## 2019-11-07 DIAGNOSIS — H34831 Tributary (branch) retinal vein occlusion, right eye, with macular edema: Secondary | ICD-10-CM | POA: Diagnosis not present

## 2019-11-07 DIAGNOSIS — H35373 Puckering of macula, bilateral: Secondary | ICD-10-CM | POA: Diagnosis not present

## 2019-11-07 DIAGNOSIS — H5319 Other subjective visual disturbances: Secondary | ICD-10-CM | POA: Diagnosis not present

## 2019-11-07 DIAGNOSIS — H35363 Drusen (degenerative) of macula, bilateral: Secondary | ICD-10-CM | POA: Diagnosis not present

## 2019-11-10 ENCOUNTER — Other Ambulatory Visit: Payer: Self-pay | Admitting: Neurology

## 2019-11-28 ENCOUNTER — Other Ambulatory Visit: Payer: Self-pay | Admitting: Family Medicine

## 2019-12-07 DIAGNOSIS — Z23 Encounter for immunization: Secondary | ICD-10-CM | POA: Diagnosis not present

## 2019-12-18 ENCOUNTER — Other Ambulatory Visit: Payer: Self-pay

## 2019-12-18 ENCOUNTER — Encounter: Payer: Self-pay | Admitting: Family Medicine

## 2019-12-18 ENCOUNTER — Telehealth (INDEPENDENT_AMBULATORY_CARE_PROVIDER_SITE_OTHER): Payer: Medicare Other | Admitting: Family Medicine

## 2019-12-18 VITALS — BP 162/86 | HR 67 | Temp 95.8°F | Wt 138.0 lb

## 2019-12-18 DIAGNOSIS — I10 Essential (primary) hypertension: Secondary | ICD-10-CM | POA: Diagnosis not present

## 2019-12-18 DIAGNOSIS — R0981 Nasal congestion: Secondary | ICD-10-CM | POA: Diagnosis not present

## 2019-12-18 MED ORDER — DOXYCYCLINE HYCLATE 100 MG PO TABS: 100.0000 mg | ORAL_TABLET | Freq: Two times a day (BID) | ORAL | 0 refills | Status: AC

## 2019-12-18 MED ORDER — LISINOPRIL 20 MG PO TABS: 20.0000 mg | ORAL_TABLET | Freq: Every day | ORAL | 1 refills | Status: DC

## 2019-12-18 MED ORDER — FLUTICASONE PROPIONATE 50 MCG/ACT NA SUSP: 1.0000 | Freq: Every day | NASAL | 2 refills | Status: DC | PRN

## 2019-12-18 MED ORDER — CETIRIZINE HCL 5 MG PO TABS: 5.0000 mg | ORAL_TABLET | Freq: Every day | ORAL | 1 refills | Status: DC

## 2019-12-18 NOTE — Patient Instructions (Addendum)
#  High blood pressure - increase Lisinopril to 20 mg daily (new prescription sent to the pharmacy   #Sinus congestion - Start taking Zyrtec and Flonase - if no improvement in 2-3 days, then start the Antibiotic (Doxycycline)

## 2019-12-18 NOTE — Assessment & Plan Note (Signed)
Reports several weeks of symptoms and hx of nasal surgery. She does not some recent improvement so will try allergy treatment first - flonase/zyrtec. If no improvement given pressure and duration trial of abx (PCN allergy). Call if symptoms do not resolve as may want to do ENT referral given hx of surgery

## 2019-12-18 NOTE — Progress Notes (Signed)
I connected with [name] on 12/18/19 at  8:00 AM EDT by video and verified that I am speaking with the correct person using two identifiers.   I discussed the limitations, risks, security and privacy concerns of performing an evaluation and management service by video and the availability of in person appointments. I also discussed with the patient that there may be a patient responsible charge related to this service. The patient expressed understanding and agreed to proceed.  Patient location: Home Provider Location: [place] [facility] Participants: [name] and [name] and caretaker   Subjective:     [name] is a 86 y.o. female presenting for Nasal Congestion (x 2 weeks )     Sinusitis This is a new problem. The current episode started 1 to 4 weeks ago. The problem has been gradually improving since onset. There has been no fever. Associated symptoms include congestion, coughing, sinus pressure and a sore throat. Pertinent negatives include no chills, ear pain, shortness of breath or sneezing. Past treatments include oral decongestants and saline sprays (eye drops). The treatment provided mild relief.   Hx of surgery and can only breathe out of one side of the nose  Review of Systems  Constitutional: Negative for chills and fever.  HENT: Positive for congestion, postnasal drip, rhinorrhea, sinus pressure and sore throat. Negative for ear pain, sneezing and trouble swallowing.   Eyes: Negative for itching.  Respiratory: Positive for cough. Negative for shortness of breath.   Cardiovascular: Negative for chest pain.  Gastrointestinal: Negative for diarrhea, nausea and vomiting.     Social History   Tobacco Use  Smoking Status Former Smoker  . Packs/day: 0.25  . Years: 10.00  . Pack years: 2.50  . Types: Cigarettes  . Quit date: 26  . Years since quitting: 41.7  Smokeless Tobacco Never Used        Objective:   BP  Readings from Last 3 Encounters:  12/18/19 (!) 162/86  10/11/19 (!) 168/82  07/31/19 (!) 170/80   Wt Readings from Last 3 Encounters:  12/18/19 138 lb (62.6 kg)  10/11/19 144 lb 9.6 oz (65.6 kg)  07/31/19 143 lb 8 oz (65.1 kg)    BP (!) 162/86   Pulse 67   Temp (!) 95.8 F (35.4 C) (Oral)   Wt 138 lb (62.6 kg)   SpO2 (!) 87%   BMI 24.45 kg/m    Physical Exam Constitutional:      Appearance: Normal appearance. She is not ill-appearing.  HENT:     Head: Normocephalic and atraumatic.     Right Ear: External ear normal.     Left Ear: External ear normal.  Eyes:     Conjunctiva/sclera: Conjunctivae normal.  Pulmonary:     Effort: Pulmonary effort is normal. No respiratory distress.  Neurological:     Mental Status: She is alert. Mental status is at baseline.  Psychiatric:        Mood and Affect: Mood normal.        Behavior: Behavior normal.        Thought Content: Thought content normal.        Judgment: Judgment normal.            Assessment & Plan:   Problem List Items Addressed This Visit      Cardiovascular and Mediastinum   Malignant hypertension    BP elevated on home reading. Increase lisinopril 10 mg >20 mg. Return in 4 weeks  for re-check      Relevant Medications   lisinopril (ZESTRIL) 20 MG tablet     Respiratory   Sinus congestion - Primary    Reports several weeks of symptoms and hx of nasal surgery. She does not some recent improvement so will try allergy treatment first - flonase/zyrtec. If no improvement given pressure and duration trial of abx (PCN allergy). Call if symptoms do not resolve as may want to do ENT referral given hx of surgery      Relevant Medications   fluticasone (FLONASE) 50 MCG/ACT nasal spray   cetirizine (ZYRTEC) 5 MG tablet   doxycycline (VIBRA-TABS) 100 MG tablet       Return in about 4 weeks (around 01/15/2020) for blood pressure.  [name], MD

## 2019-12-18 NOTE — Assessment & Plan Note (Signed)
BP elevated on home reading. Increase lisinopril 10 mg >20 mg. Return in 4 weeks for re-check

## 2019-12-19 ENCOUNTER — Ambulatory Visit: Payer: Medicare Other | Admitting: Neurology

## 2019-12-25 ENCOUNTER — Ambulatory Visit: Payer: Medicare Other | Attending: Internal Medicine

## 2019-12-25 DIAGNOSIS — Z23 Encounter for immunization: Secondary | ICD-10-CM

## 2019-12-25 NOTE — Progress Notes (Signed)
   Covid-19 Vaccination Clinic  Name:  AZARIAH LATENDRESSE    MRN: [id number] DOB: April 18, 1930  12/25/2019  Ms. Filsinger was observed post Covid-19 immunization for 15 minutes without incident. She was provided with Vaccine Information Sheet and instruction to access the V-Safe system.   Ms. Negrette was instructed to call 911 with any severe reactions post vaccine: Marland Kitchen Difficulty breathing  . Swelling of face and throat  . A fast heartbeat  . A bad rash all over body  . Dizziness and weakness

## 2020-01-08 DIAGNOSIS — G5603 Carpal tunnel syndrome, bilateral upper limbs: Secondary | ICD-10-CM | POA: Diagnosis not present

## 2020-01-09 ENCOUNTER — Other Ambulatory Visit: Payer: Self-pay | Admitting: Family Medicine

## 2020-01-09 DIAGNOSIS — R0981 Nasal congestion: Secondary | ICD-10-CM

## 2020-01-12 IMAGING — CT CT CERVICAL SPINE W/O CM
5 of 8 series · 14 of 33 positions shown, 15 images · non-contrast
Comparison: Cervical spine CT 09/13/2016, head CT 09/18/2012

CLINICAL DATA: Fell and hit head laceration to left forehead on
Coumadin

EXAM:
CT HEAD WITHOUT CONTRAST
CT CERVICAL SPINE WITHOUT CONTRAST
TECHNIQUE: Multidetector CT imaging of the head and cervical spine was
performed following the standard protocol without intravenous
contrast. Multiplanar CT image reconstructions of the cervical spine
were also generated.

[Series 5: head bone · axial · 0.43mm/px · z∈[-116,-62]mm · 2 of 81 slices shown]
[im 27/81  bone]
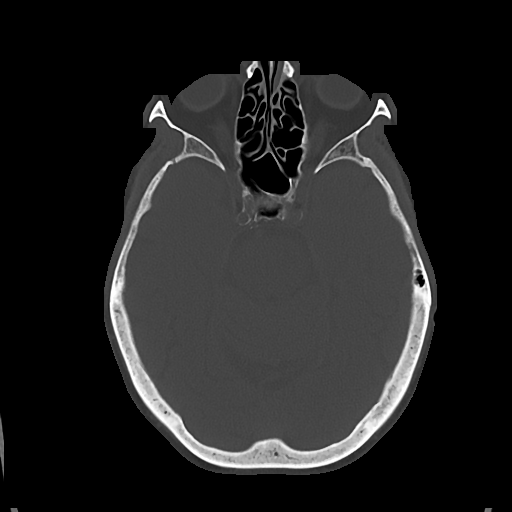
[im 54/81  bone]
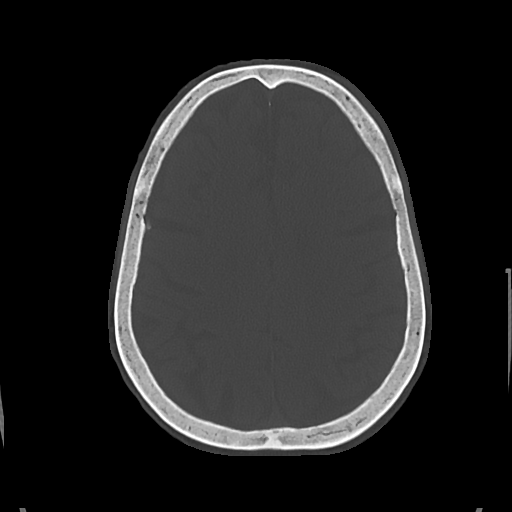

[Series 6: cor soft · coronal · 0.31mm/px · 2 of 75 slices shown]
[im 25/75  bone]
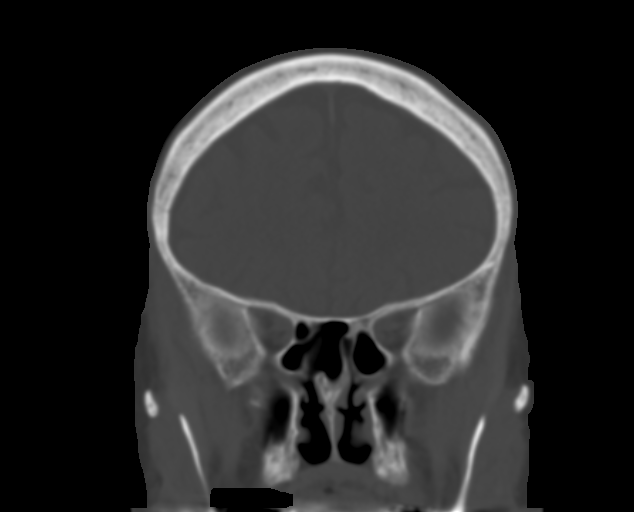
[im 50/75  bone]
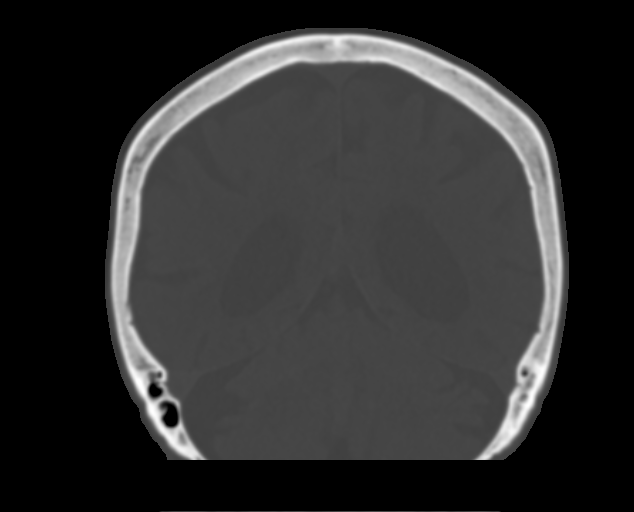

[Series 9: c spine soft · axial · 0.42mm/px · z∈[-260,-158]mm · 3 of 103 slices shown]
[im 26/103  soft-tissue]
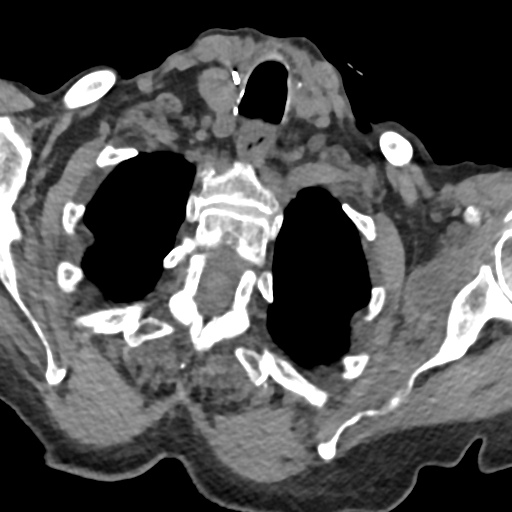
[im 52/103  soft-tissue]
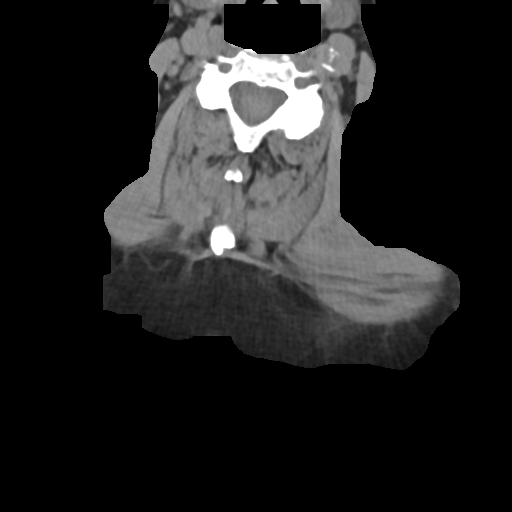
[im 77/103  soft-tissue]
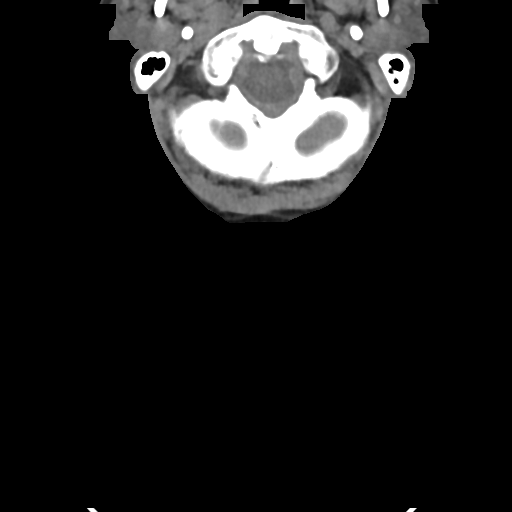

[Series 10: sag bone · sagittal · 0.35mm/px · 4 of 61 slices shown]
[im 13/61  bone]
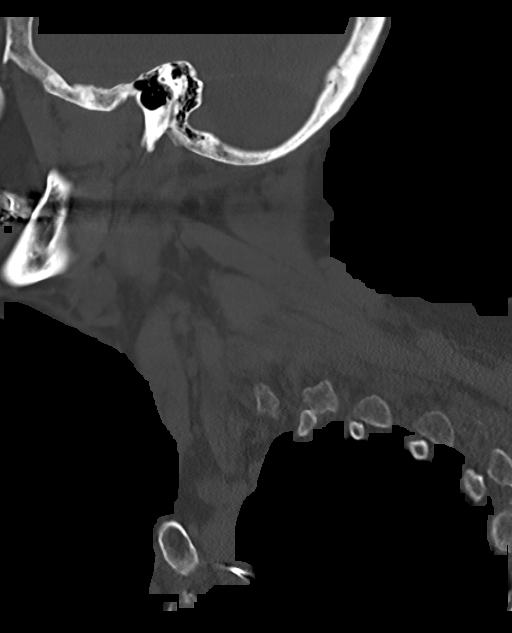
[im 25/61  bone]
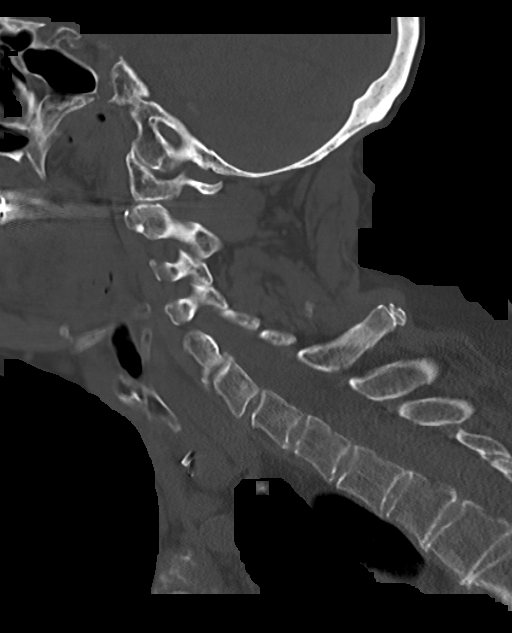
[im 37/61  bone]
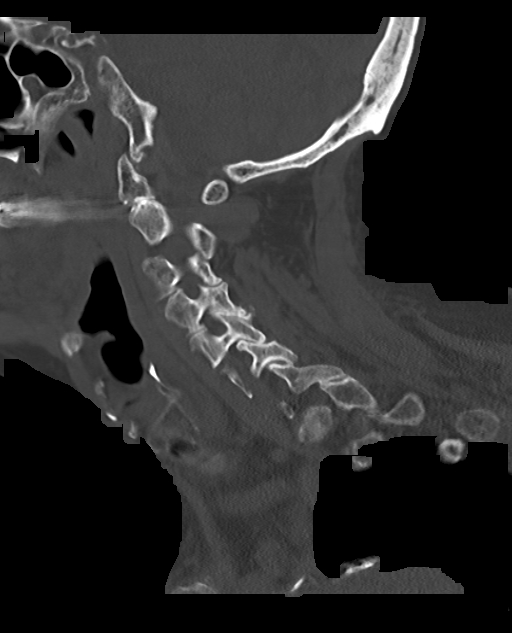
[im 49/61  bone]
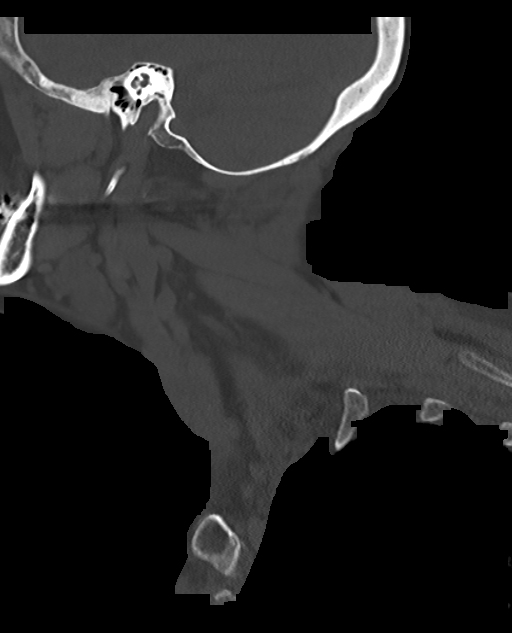

[Series 12: orthogonal axials · axial · 0.21mm/px · z∈[-275,-144]mm · 3 of 68 slices shown, 4 images]
[im 1/68  soft-tissue]
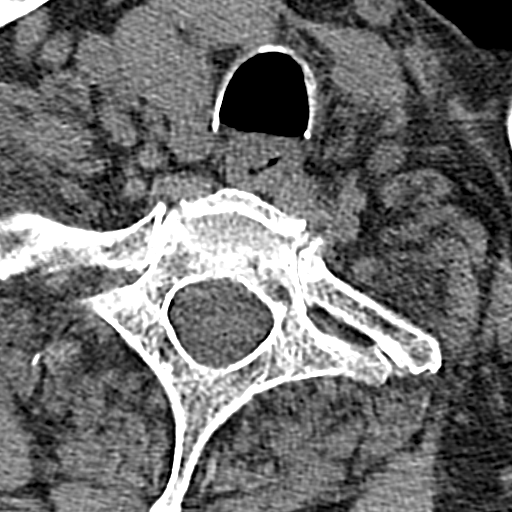
[im 1/68  bone]
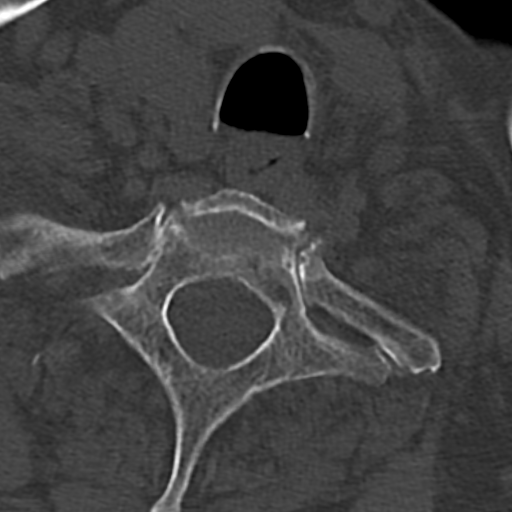
[im 34/68  bone]
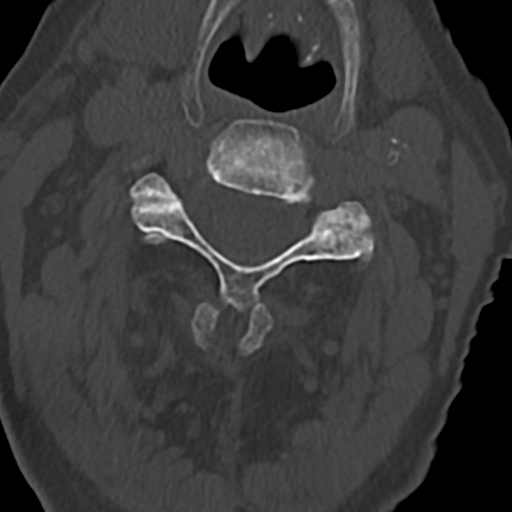
[im 68/68  bone]
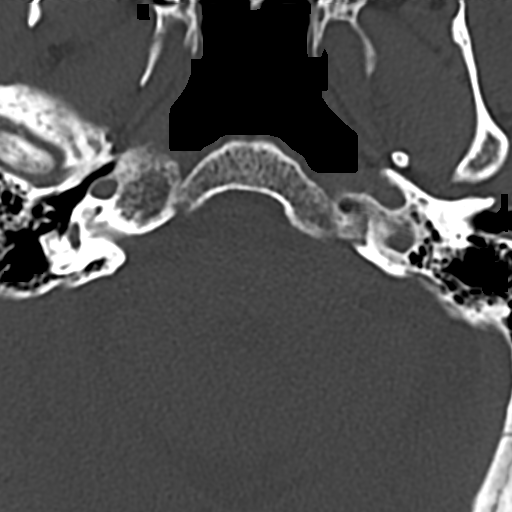

[14 of 33 positions shown; findings below may reference images not displayed]

FINDINGS: CT HEAD FINDINGS

Brain: No acute territorial infarction, hemorrhage or intracranial
mass is visualized. Moderate atrophy. Mild small vessel ischemic
changes of the white matter. Stable ventricle size.

Vascular: No hyperdense vessels.  Carotid vascular calcification

Skull: No fracture

Sinuses/Orbits: No acute finding.

Other: Moderate left forehead scalp hematoma

CT CERVICAL SPINE FINDINGS

Alignment: Trace anterior listhesis C4 on C5, no change. Facet
alignment within normal limits.

Skull base and vertebrae: No acute fracture. No primary bone lesion
or focal pathologic process.

Soft tissues and spinal canal: No prevertebral fluid or swelling. No
visible canal hematoma.

Disc levels: Moderate degenerative changes at C4-C5. Mild
degenerative changes at C3-C4, C5-C6. Multiple level bilateral facet
degenerative change.

Upper chest: Mild emphysematous disease in the apices. Thirteen mm
oval soft tissue nodule anterior aspect of the left parotid.

Other: None
IMPRESSION: 1. No CT evidence for acute intracranial abnormality. Atrophy with
small vessel ischemic changes of the white matter. Moderate left
forehead scalp hematoma
2. Degenerative changes of the cervical spine. No acute osseous
abnormality
3. 13 mm soft tissue nodule anterior aspect of left parotid.
Nonemergent MRI follow-up could be obtained to further evaluate
4. Emphysematous disease at the lung apices

## 2020-01-28 ENCOUNTER — Ambulatory Visit: Payer: Medicare Other

## 2020-01-28 ENCOUNTER — Other Ambulatory Visit: Payer: Self-pay

## 2020-01-28 DIAGNOSIS — I10 Essential (primary) hypertension: Secondary | ICD-10-CM

## 2020-01-28 DIAGNOSIS — R7303 Prediabetes: Secondary | ICD-10-CM

## 2020-01-28 NOTE — Chronic Care Management (AMB) (Signed)
Chronic Care Management Pharmacy  Name: TUWANDA VOKES  MRN: [id number] DOB: [birth year]  Chief Complaint/ HPI  [name],  86 y.o., female presents for their Follow-Up CCM visit with the clinical pharmacist via telephone  PCP : [name], MD Transitioned care from previous PCP, Dr. Reynaldo Minium, 02/2019  Their chronic conditions include: permanent AFIB, HTN, HLD, hypothyroidism, complete heart block, dementia without behavioral disturbance  Patient concerns: denies medication concerns  Office Visits:  12/18/19: PCP visit - BP elevated on home reading. Increase lisinopril 10 mg >20 mg. Return in 4 weeks for re-check; Sinus congestion --> Start taking Zyrtec and Flonase, if no improvement in 2-3 days, then start the Antibiotic (Doxycycline)   07/31/19 PCP visit - Chronic Afib followed by Dr. Wynonia Lawman, just stopped digoxin. BP elevated, some permissive HTN given history of orthostatic hypotension and falls, improved leg swelling due to switch from amlodipine to lisinopril, recommend home BP monitoring. Dr. Delice Lesch follows dementia, planning to start on donepezil if unable to be seen by Dr. Delice Lesch sooner. Hypothyroidism, last TSH low and dose decreased, repeat today. Unclear if she is taking right dose.   06/21/19: Telephone call with PCP - stop amlodipine, start lisinopril 10 mg  Consult Visit:  Weekly outpatient rehab for chronic left shoulder pain  07/30/19 Caryl Comes, Cardiology - Follow up pacemaker implant, history of complete heart block and severe orthostatic hypotension, permanent AFIB. Refer to PT for left shoulder. BP elevated. Stop Digoxin.   07/25/19 Nicole Kindred, Neuropsychologist - Psychotherapy completed, trial of Aricept is warranted. Anticholinergic medication, oxybutynin, may need to be reviewed.  Medications: Outpatient Encounter Medications as of 01/28/2020  Medication Sig Note  . acetaminophen (TYLENOL) 500 MG tablet Take 1,000 mg by mouth every 8 (eight) hours as  needed for moderate pain or headache.    Marland Kitchen atorvastatin (LIPITOR) 20 MG tablet TAKE 1 TABLET BY MOUTH EVERY DAY IN THE EVENING   . Carboxymethylcell-Hypromellose (GENTEAL OP) Place 1 drop into both eyes daily as needed (dry eyes).   . cetirizine (ZYRTEC) 5 MG tablet TAKE 1 TABLET BY MOUTH EVERY DAY   . Cholecalciferol (VITAMIN D) 50 MCG (2000 UT) tablet Take 1 tablet (2,000 Units total) by mouth daily.   . Cyanocobalamin (B-12) 1000 MCG CAPS TAKE 1 CAPSULE BY MOUTH EVERY DAY   . ELIQUIS 5 MG TABS tablet TAKE 1 TABLET BY MOUTH TWICE A DAY   . fluticasone (FLONASE) 50 MCG/ACT nasal spray Place 1 spray into both nostrils daily as needed for allergies or rhinitis.   Marland Kitchen levothyroxine (SYNTHROID) 88 MCG tablet TAKE 1 TABLET BY MOUTH EVERY MORNING ON AN EMPTY STOMACH   . lisinopril (ZESTRIL) 20 MG tablet Take 1 tablet (20 mg total) by mouth daily.   Marland Kitchen oxybutynin (DITROPAN-XL) 5 MG 24 hr tablet TAKE 1 TABLET BY MOUTH EVERY DAY   . sertraline (ZOLOFT) 50 MG tablet TAKE 1 TABLET BY MOUTH EVERY DAY 01/28/2020: Bedtime   . donepezil (ARICEPT) 10 MG tablet Take 1/2 tablet daily for 2 weeks, then increase to 1 tablet daily (Patient not taking: Reported on 01/28/2020)    No facility-administered encounter medications on file as of 01/28/2020.   Current Diagnosis/Assessment:  Goals Addressed            This Visit's Progress   . Pharmacy Care Plan       CARE PLAN ENTRY  Current Barriers:  . Chronic Disease Management support, education, and care coordination needs related to Hypertension and Hyperlipidemia   Hypertension  BP Readings from Last 3 Encounters:  07/31/19 (!) 170/80  07/30/19 (!) 168/98  06/19/19 135/72 .  Pharmacist Clinical Goal(s): o Over the next 14 days, patient will work with PharmD and providers to achieve BP goal <140/90 mmHg . Current regimen:  o Lisinopril 20 mg - 1 tablet daily . Interventions: o Recommend home blood pressure monitoring  . Patient self care activities - Over  the next 14 days, patient will: o Check blood pressure daily before breakfast, document, and provide at future appointment  Hyperlipidemia Lab Results  Component Value Date/Time   LDLCALC 120 (H) 12/12/2018 08:00 AM .  Pharmacist Clinical Goal(s): o Over the next 3 months, patient will work with PharmD and providers to achieve LDL goal < 100 . Current regimen:  o Atorvastatin 20 mg - 1 tablet daily in the evening . Interventions: o Comprehensive medication review . Patient self care activities - Over the next 3 months, patient will: o Continue to take medication each day as prescribed  Please see past updates related to this goal by clicking on the "Past Updates" button in the selected goal       AFIB   CBC Latest Ref Rng & Units 05/28/2019 02/13/2019 12/12/2018  WBC 4.0 - 10.5 K/uL 6.2 6.0 6.6  Hemoglobin 12.0 - 15.0 g/dL 12.4 12.4 13.0  Hematocrit 36 - 46 % 37.7 36.8 40.0  Platelets 150 - 400 K/uL 202.0 228 226   Kidney Function Lab Results  Component Value Date/Time   CREATININE 0.99 07/31/2019 10:16 AM   CREATININE 0.99 05/28/2019 12:45 PM   GFR 53.09 (L) 07/31/2019 10:16 AM   GFRNONAA 42 (L) 02/13/2019 12:00 AM   GFRAA 49 (L) 02/13/2019 12:00 AM   K 4.5 07/31/2019 10:16 AM   K 4.6 05/28/2019 12:45 PM   Weight fluctuates < 60 kg, daughter has discussed with cardiology remaining on 5 mg BID despite weight fluctuations as pt is not having any bleeding.  Patient has a pacemaker.  Patient has tried these meds in past: digoxin, diltiazem, amlodipine Patient is currently controlled on the following medications:   Eliquis 5 mg - 1 tablet twice daily  We discussed: denies abnormal bruising or bleeding Adherence: refills timely  Update 01/28/20: confirms twice daily adherence, denies concerns   Plan: Continue current medications  Hypertension   BP goal < 150/90 mmHg Office blood pressures are  BP Readings from Last 3 Encounters:  12/18/19 (!) 162/86  10/11/19 (!)  168/82  07/31/19 (!) 170/80   Home blood pressure monitoring:  Before BF:  10/14 - 156/87  10/15 - 165/96 10/16 - 170/90  10/17 - 169/90 10/18 - 167/93  After BF: 10/29 - 110/59 11/7 - 155/94 11/8  - 120/63  Pt has severe orthostatic hypotension, per cardiology  Patient has failed these meds in the past: amlodipine, diltiazem Patient is currently uncontrolled on the following medications:  . Lisinopril 20 mg - 1 tablet daily  Pt was switched from amlodipine to lisinopril 06/21/19 due to leg swelling Adherence: < 5 day gap between refills   Update 01/28/20: Dose was increased from 10 to 20 mg per Dr. Einar Pheasant on 12/18/19 Pt reports BP is a little high before BF but usually comes down after breakfast. Due to history of orthostatic hypotension, would like to assess readings after breakfast prior to recommending dose adjustments.   Plan: Continue current medications; Check BP daily after breakfast for next 2 weeks. CMA f/u call week of 02/11/20. Alert Dr. Einar Pheasant to home  BP readings.    Hyperlipidemia   Lipid Panel     Component Value Date/Time   CHOL 212 (H) 12/12/2018 0800   TRIG 72 12/12/2018 0800   HDL 78 12/12/2018 0800   CHOLHDL 2.7 12/12/2018 0800   VLDL 14 12/12/2018 0800   LDLCALC 120 (H) 12/12/2018 0800    LDL goal < 70 (history of TIA) Patient has failed these meds in past: none Patient is currently uncontrolled on the following medications:  . Atorvastatin 20 mg - 1 tablet in the evening  Adherence: < 5 day gap between refills, confirmed adherence with pill bottle at home   Update 01/28/20: Maintain current dose due to age   Plan: Continue current medications   Dementia   Patient has failed these meds in past: none Patient is currently controlled on the following medications:  . Sertraline 25 mg - 1 tablet in the evening  We discussed: reports outlook and mood are good, doing well  Adherence: < 5 day gap between refills, confirmed adherence with pill bottle at  home   Update 01/28/20: confirms adherence, denies concerns  Plan: Continue current medications   OTCs/Misc   Patient is currently on the following medications:  OTC: . Tylenol 500 mg - 2 tablets every 8 hrs as needed  . Genteal OP - 1 drop into both eyes daily as needed for dry eyes . Flonase 50 mcg/act - 1 spray into both nostrils daily as needed . Vitamin D 2000 units - 1 tablet daily at supper   . Vitamin B12 1000 mcg - 1 capsule daily   Prescription: . Oxybutinin 5 mg - 1 tablet daily   Not taking: . Celecoxib 200 mg  Assessment: Oxybutynin per previous PCP, may consider discontinuation to simplify medications and reduce anticholinergic effects pending PCP evaluation of urinary symptoms  Plan: Continue current medications; Recommend evaluation of oxybutynin efficacy at next PCP visit.  Medication Management   Pharmacy/Benefits: [facility] [place] (they know her well, been going there for years) Adherence: Pt denies taking donepezil. Stated she is taking the following: Sitter helps with filling pillbox  AM --> Eliquis, levothyroxine, lisinopril, vitamin D3, B12 PM --> Eliquis, atorvastatin, oxybutynin ER, sertraline PRN --> fluticasone, zyrtec, dry eye drops, Tylenol    Follow up: 2 weeks CMA call for BP review  Debbora Dus, PharmD Clinical Pharmacist [facility] [phone]

## 2020-01-28 NOTE — Patient Instructions (Signed)
Dear [name],  Below is a summary of the goals we discussed during our follow up appointment on January 28, 2020. Please contact me anytime with questions or concerns.   Visit Information  Goals Addressed            This Visit's Progress   . Pharmacy Care Plan       CARE PLAN ENTRY  Current Barriers:  . Chronic Disease Management support, education, and care coordination needs related to Hypertension and Hyperlipidemia   Hypertension BP Readings from Last 3 Encounters:  07/31/19 (!) 170/80  07/30/19 (!) 168/98  06/19/19 135/72 .  Pharmacist Clinical Goal(s): o Over the next 14 days, patient will work with PharmD and providers to achieve BP goal <140/90 mmHg . Current regimen:  o Lisinopril 20 mg - 1 tablet daily . Interventions: o Recommend home blood pressure monitoring  . Patient self care activities - Over the next 14 days, patient will: o Check blood pressure daily before breakfast, document, and provide at future appointment  Hyperlipidemia Lab Results  Component Value Date/Time   LDLCALC 120 (H) 12/12/2018 08:00 AM .  Pharmacist Clinical Goal(s): o Over the next 3 months, patient will work with PharmD and providers to achieve LDL goal < 100 . Current regimen:  o Atorvastatin 20 mg - 1 tablet daily in the evening . Interventions: o Comprehensive medication review . Patient self care activities - Over the next 3 months, patient will: o Continue to take medication each day as prescribed  Please see past updates related to this goal by clicking on the "Past Updates" button in the selected goal        Patient verbalizes understanding of instructions provided today.   The pharmacy team will reach out to the patient again over the next 14 days.    Debbora Dus, PharmD Clinical Pharmacist [facility] at [facility] [phone]   Basics of Medicine Management Taking your medicines correctly is an important part of managing or  preventing medical problems. Make sure you know what disease or condition your medicine is treating, and how and when to take it. If you do not take your medicine correctly, it may not work well and may cause unpleasant side effects, including serious health problems. What should I do when I am taking medicines?   Read all the labels and inserts that come with your medicines. Review the information often.  Talk with your pharmacist if you get a refill and notice a change in the size, color, or shape of your medicines.  Know the potential side effects for each medicine that you take.  Try to get all your medicines from the same pharmacy. The pharmacist will have all your information and will understand how your medicines will affect each other (interact).  Tell your health care provider about all your medicines, including over-the-counter medicines, vitamins, and herbal or dietary supplements. He or she will make sure that nothing will interact with any of your prescribed medicines. How can I take my medicines safely?  Take medicines only as told by your health care provider. ? Do not take more of your medicine than instructed. ? Do not take anyone else's medicines. ? Do not share your medicines with others. ? Do not stop taking your medicines unless your health care provider tells you to do so. ? You may need to avoid alcohol or certain foods or liquids when taking certain medicines. Follow your health care provider's instructions.  Do not split,  mash, or chew your medicines unless your health care provider tells you to do so. Tell your health care provider if you have trouble swallowing your medicines.  For liquid medicine, use the dosing container that was provided. How should I organize my medicines?  Know your medicines  Know what each of your medicines looks like. This includes size, color, and shape. Tell your health care provider if you are having trouble recognizing all the  medicines that you are taking.  If you cannot tell your medicines apart because they look similar, keep them in original bottles.  If you cannot read the labels on the bottles, tell your pharmacist to put your medicines in containers with large print.  Review your medicines and your schedule with family members, a friend, or a caregiver. Use a pill organizer  Use a tool to organize your medicine schedule. Tools include a weekly pillbox, a written chart, a notebook, or a calendar.  Your tool should help you remember the following things about each medicine: ? The name of the medicine. ? The amount (dose) to take. ? The schedule. This is the day and time the medicine should be taken. ? The appearance. This includes color, shape, size, and stamp. ? How to take your medicines. This includes instructions to take them with food, without food, with fluids, or with other medicines.  Create reminders for taking your medicines. Use sticky notes, or alarms on your watch, mobile device, or phone calendar.  You may choose to use a more advanced management system. These systems have storage, alarms, and visual and audio prompts.  Some medicines can be taken on an "as-needed" basis. These include medicines for nausea or pain. If you take an as-needed medicine, write down the name and dose, as well as the date and time that you took it. How should I plan for travel?  Take your pillbox, medicines, and organization system with you when traveling.  Have your medicines refilled before you travel. This will ensure that you do not run out of your medicines while you are away from home.  Always carry an updated list of your medicines with you. If there is an emergency, a first responder can quickly see what medicines you are taking.  Do not pack your medicines in checked luggage in case your luggage is lost or delayed.  If any of your medicines is considered a controlled substance, make sure you bring a  letter from your health care provider with you. How should I store and discard my medicines? For safe storage:  Store medicines in a cool, dry area away from light, or as directed by your health care provider. Do not store medicines in the bathroom. Heat and humidity will affect them.  Do not store your medicines with other chemicals, or with medicines for pets or other household members.  Keep medicines away from children and pets. Do not leave them on counters or bedside tables. Store them in high cabinets or on high shelves. For safe disposal:  Check expiration dates regularly. Do not take expired medicines. Discard medicines that are older than the expiration date.  Learn a safe way to dispose of your medicines. You may: ? Use a local government, hospital, or pharmacy medicine-take-back program. ? Mix the medicines with inedible substances, put them in a sealed bag or empty container, and throw them in the trash. What should I remember?  Tell your health care provider if you: ? Experience side effects. ? Have new symptoms. ?  Have other concerns about taking your medicines.  Review your medicines regularly with your health care provider. Other medicines, diet, medical conditions, weight changes, and daily habits can all affect how medicines work. Ask if you need to continue taking each medicine, and discuss how well each one is working.  Refill your medicines early to avoid running out of them.  In case of an accidental overdose, call your local [place] at [phone] or visit your local emergency department immediately. This is important. Summary  Taking your medicines correctly is an important part of managing or preventing medical problems.  You need to make sure that you understand what you are taking a medicine for, as well as how and when you need to take it.  Know your medicines and use a pill organizer to help you take your medicines correctly.  In case  of an accidental overdose, call your local [place] at [phone] or visit your local emergency department immediately. This is important. This information is not intended to replace advice given to you by your health care provider. Make sure you discuss any questions you have with your health care provider. Document Revised: 03/03/2017 Document Reviewed: 03/03/2017 Elsevier Patient Education  2020 [employer].

## 2020-01-29 NOTE — Telephone Encounter (Signed)
Sending note to Sharyn Lull to see if phone note is complete and can be signed.

## 2020-02-12 ENCOUNTER — Other Ambulatory Visit: Payer: Self-pay | Admitting: Family Medicine

## 2020-02-12 DIAGNOSIS — R0981 Nasal congestion: Secondary | ICD-10-CM

## 2020-02-12 NOTE — Telephone Encounter (Signed)
Pharmacy requests refill on: Cetirizine HCL 5 mg   LAST REFILL: 01/09/2020 (Q-30, R-1) LAST OV: 07/31/2019 NEXT OV: Not Scheduled  PHARMACY: [id number] [place], [place]   Too early for refill

## 2020-02-13 ENCOUNTER — Other Ambulatory Visit: Payer: Self-pay | Admitting: Family Medicine

## 2020-02-15 ENCOUNTER — Ambulatory Visit: Payer: Medicare Other

## 2020-02-17 LAB — CUP PACEART REMOTE DEVICE CHECK
Battery Remaining Longevity: 146 mo
Battery Voltage: 3.09 V
Brady Statistic RV Percent Paced: 82.85 %
Date Time Interrogation Session: 20211126192939
Implantable Lead Implant Date: 20201214
Implantable Lead Location: 753860
Implantable Lead Model: 1948
Implantable Pulse Generator Implant Date: 20201214
Lead Channel Impedance Value: 418 Ohm
Lead Channel Impedance Value: 589 Ohm
Lead Channel Pacing Threshold Amplitude: 0.875 V
Lead Channel Pacing Threshold Pulse Width: 0.4 ms
Lead Channel Sensing Intrinsic Amplitude: 5.75 mV
Lead Channel Sensing Intrinsic Amplitude: 5.75 mV
Lead Channel Setting Pacing Amplitude: 2.5 V
Lead Channel Setting Pacing Pulse Width: 0.4 ms
Lead Channel Setting Sensing Sensitivity: 1.2 mV

## 2020-02-18 NOTE — Telephone Encounter (Signed)
Needs a medicare wellness visit as soon as possible.

## 2020-02-21 NOTE — Telephone Encounter (Signed)
Called patient to schedule follow up. LVM to call back.  

## 2020-02-25 ENCOUNTER — Telehealth: Payer: Self-pay | Admitting: Family Medicine

## 2020-02-25 NOTE — Telephone Encounter (Signed)
Pt called she has this sinus infection for 2 months and her head has been hurting sine 630 and her BP is up and doesn't know if she has an infection in her face or [phone]

## 2020-02-25 NOTE — Telephone Encounter (Signed)
Can someone call and triage this pt?

## 2020-02-25 NOTE — Telephone Encounter (Signed)
Would recommend that she schedule an office visit in-person to discuss the blood pressure.   Review of chart shows she was seen for virtual visit 12/18/2019 and lisinopril was increased and she was given a prescription for doxycycline for possible sinus infection. She was advised to f/u in 1 month for BP check.   Did her symptoms improve on the doxycycline? Are her current symptoms the same as what she had in September or did symptoms go away and come back?   If the same symptoms we can plan for ENT referral

## 2020-02-25 NOTE — Telephone Encounter (Signed)
Attempted to call pt back. No answer on cell or home phone. Left VM requesting a call back on pt's cell.

## 2020-02-25 NOTE — Telephone Encounter (Signed)
Spoke to patient and was advised that her nose has been dripping constantly for 3 months.  Patient stated that she has a headache and feels that it is from her sinuses. Patient stated that she took two tylenol which helped some. Patient stated that she has a productive cough-clear. Patient stated that are eyes are watering and are swollen. Patient stated that her blood pressure has been elevated some. Patient gave the following blood pressure reading 02/21/20 159/90 HR 63, 02/22/20 167/96 HR 67, 02/23/20 151/86, 02/24/20 168/88 HR 76. Patient checked her blood pressure while on the phone and it was 147/78 HR 71.Patient stated that her head is feeling better after the tylenol. Patient denies chest pain, difficulty breathing or SOB.  Patient was given ER precautions and she verbalized understanding.  Patient stated that she needs advice about her nose dripping and her blood pressure. [facility]

## 2020-02-26 NOTE — Telephone Encounter (Signed)
Just an FYI sent for scanning a note from access nurse call cancelled by request because already speaking with a nurse at [facility].

## 2020-02-28 NOTE — Telephone Encounter (Signed)
Agree with Tawni Millers suggestions

## 2020-02-28 NOTE — Telephone Encounter (Signed)
Called patient and scheduled cpe and AWV with nurse

## 2020-02-28 NOTE — Telephone Encounter (Signed)
I spoke with pt; pt has appt on 04/10/20 for AWV with Dr Einar Pheasant.  Symptoms not really improved after taking doxycycline. Eyes run water especially after coughing. Eyes are swollen; pt said previously eye dr told pt that she could not have eyelids lifted. No blurred or double vision. Symptoms never completely went away; may have gotten slightly better after abx but pt is not really sure about that. Presently pt has almost continuous drip from rt nostril. Pt said blowing nose does not help the drippy nose.  [facility] [place].

## 2020-02-28 NOTE — Telephone Encounter (Signed)
Contacted patient to schedule AWv. Wanting to talk to nurse to discuss nose issue.

## 2020-02-28 NOTE — Telephone Encounter (Signed)
Agree the patient needs office visit for blood pressure readings, also lisinopril may be contributing to cough.  Is she taking her Zyrtec daily, how about using her Flonase nasal spray? If her symptoms are the same as they were in September this year, then we need to refer her to ENT for evaluation, especially given her history of nasal surgery.  Let me know.

## 2020-03-03 NOTE — Telephone Encounter (Signed)
Noted will address at our visit tomorrow

## 2020-03-03 NOTE — Telephone Encounter (Signed)
Spoke to pt and tried to get some clarification for Dr. Einar Pheasant. Pt states that she isn't sure if she is taking the zyrtec, as someone else fills her pill box. Pt states she does use her flonase nasal spray.  Pt feels that her symptoms are the same as what she had in September and is open to getting a referral to an ENT.  Pt gave me her last 3 day BP readings as follows: 12/11: 149/90  73 12/12: 179/92  69 12/13: 166/89  75  Pt made an appt for tomorrow to follow up on BP.

## 2020-03-04 ENCOUNTER — Encounter: Payer: Self-pay | Admitting: Family Medicine

## 2020-03-04 ENCOUNTER — Ambulatory Visit (INDEPENDENT_AMBULATORY_CARE_PROVIDER_SITE_OTHER): Payer: Medicare Other | Admitting: Family Medicine

## 2020-03-04 ENCOUNTER — Other Ambulatory Visit: Payer: Self-pay

## 2020-03-04 VITALS — BP 160/90 | HR 62 | Temp 96.9°F | Wt 146.0 lb

## 2020-03-04 DIAGNOSIS — R0981 Nasal congestion: Secondary | ICD-10-CM

## 2020-03-04 DIAGNOSIS — H02403 Unspecified ptosis of bilateral eyelids: Secondary | ICD-10-CM | POA: Diagnosis not present

## 2020-03-04 DIAGNOSIS — I10 Essential (primary) hypertension: Secondary | ICD-10-CM | POA: Diagnosis not present

## 2020-03-04 NOTE — Assessment & Plan Note (Signed)
BP elevated but improved on repeat. Reviewed importance of resting 5 minutes before home BP check. She will do home monitoring and call back. If still elevated will likely plan to add amlodipine. Cont lisinopril 20 mg.

## 2020-03-04 NOTE — Patient Instructions (Addendum)
#  Congestion/Sinus issues - Referral to Ear Nose and Throat specialist  #Blood pressure  To check your blood pressure 1) Sit in a quiet and relaxed place for 5 minutes 2) Make sure your feet are flat on the ground 3) Consider checking first thing in the morning  4) 4 hours after you take your medication  Normal blood pressure is less than 140/90 Ideally you blood pressure should be around 120/80  If it is still high - we can start Amlodipine 5 mg

## 2020-03-04 NOTE — Assessment & Plan Note (Signed)
She notes the ophthalmologist does not want to do an eye lift. Will get ENT to see if the swelling is related as she feels it is. Appreciate specialty support.

## 2020-03-04 NOTE — Assessment & Plan Note (Signed)
Pt notes persistent symptoms and no improvement on previous course of abx. Cont flonase and zyrtec. ENT referral for continued support.

## 2020-03-04 NOTE — Progress Notes (Signed)
Subjective:     [name] is a 86 y.o. female presenting for Follow-up (BP ) and Eye Problem (Fluid around eyes and having vision problems due to it)     HPI  #HTN - does not do her medications - caregiver does this - is taking her medications to her knowledge - has been having high bp at home - taking her medication - sometimes it is lower than other days  #eye problem - sinus drainage - coughing overnight - 2 months of symptoms - congestion behind the eyes - endorses vision issues - having issues with her eyelids - typically 120-130 and up   Review of Systems   Social History   Tobacco Use  Smoking Status Former Smoker  . Packs/day: 0.25  . Years: 10.00  . Pack years: 2.50  . Types: Cigarettes  . Quit date: 35  . Years since quitting: 41.9  Smokeless Tobacco Never Used        Objective:    BP Readings from Last 3 Encounters:  03/04/20 (!) 160/90  12/18/19 (!) 162/86  10/11/19 (!) 168/82   Wt Readings from Last 3 Encounters:  03/04/20 146 lb (66.2 kg)  12/18/19 138 lb (62.6 kg)  10/11/19 144 lb 9.6 oz (65.6 kg)    BP (!) 160/90   Pulse 62   Temp (!) 96.9 F (36.1 C) (Temporal)   Wt 146 lb (66.2 kg)   SpO2 98%   BMI 25.86 kg/m    Physical Exam Constitutional:      General: She is not in acute distress.    Appearance: She is well-developed. She is not diaphoretic.  HENT:     Right Ear: External ear normal.     Left Ear: External ear normal.     Nose: Congestion present.  Eyes:     Conjunctiva/sclera: Conjunctivae normal.     Comments: Lower eyelid swelling. Upper eyelid drooping  Cardiovascular:     Rate and Rhythm: Normal rate and regular rhythm.     Heart sounds: Murmur heard.    Pulmonary:     Effort: Pulmonary effort is normal. No respiratory distress.     Breath sounds: Normal breath sounds. No wheezing.  Musculoskeletal:     Cervical back: Neck supple.  Skin:    General: Skin is warm and dry.     Capillary  Refill: Capillary refill takes less than 2 seconds.  Neurological:     Mental Status: She is alert. Mental status is at baseline.  Psychiatric:        Mood and Affect: Mood normal.        Behavior: Behavior normal.           Assessment & Plan:   Problem List Items Addressed This Visit      Cardiovascular and Mediastinum   Accelerated hypertension    BP elevated but improved on repeat. Reviewed importance of resting 5 minutes before home BP check. She will do home monitoring and call back. If still elevated will likely plan to add amlodipine. Cont lisinopril 20 mg.         Respiratory   Sinus congestion - Primary    Pt notes persistent symptoms and no improvement on previous course of abx. Cont flonase and zyrtec. ENT referral for continued support.       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 Will get ENT to see if the swelling is related as she feels it is. Appreciate specialty support.           Return in about 1 week (around 03/11/2020) for update with home bp.  [name], MD  This visit occurred during the SARS-CoV-2 public health emergency.  Safety protocols were in place, including screening questions prior to the visit, additional usage of staff PPE, and extensive cleaning of exam room while observing appropriate contact time as indicated for disinfecting solutions.

## 2020-03-05 ENCOUNTER — Telehealth: Payer: Self-pay | Admitting: Family Medicine

## 2020-03-05 NOTE — Telephone Encounter (Signed)
Mary slade care giver left v/m with information below in this note and I do not see where she is on DPR. Unable to reach [name] or [name] [facility] signed) to get more info. Sending note to Dr Einar Pheasant and [facility] CMA.

## 2020-03-05 NOTE — Telephone Encounter (Signed)
Patients in home care giver calling about her medication. Patient states that she was told to quit taking the following medication: Sertraline Or if she is still to take it and what time of day. Patient was confused and the caregiver wants to make sure she is given it to her correctly. EM

## 2020-03-06 NOTE — Telephone Encounter (Signed)
She was not advised to stop taking Sertraline during our visit.   She should continue to take this once daily in the morning.   She was advised to check blood pressure and update me with home monitoring in a week.

## 2020-03-06 NOTE — Telephone Encounter (Signed)
I spoke to pt and clarified that she should continue taking her sertaline. Pt states that she has always taken it at night and doesn't want to change it to morning. I told her if that is when she has been taking it then to continue doing so as long as she takes it daily. Pt's caregiver, Solmon Ice, was there with her and I made sure that she understood this as well and she stated that she will leave a note for [facility] so pt's pill box will be filled appropriately. Everyone stated understanding.

## 2020-03-06 NOTE — Telephone Encounter (Signed)
Appreciate the confirmation

## 2020-03-11 ENCOUNTER — Other Ambulatory Visit: Payer: Self-pay | Admitting: Family Medicine

## 2020-03-11 DIAGNOSIS — R0981 Nasal congestion: Secondary | ICD-10-CM

## 2020-03-16 ENCOUNTER — Other Ambulatory Visit: Payer: Self-pay | Admitting: Family Medicine

## 2020-03-19 ENCOUNTER — Other Ambulatory Visit: Payer: Self-pay | Admitting: Family Medicine

## 2020-03-19 DIAGNOSIS — I10 Essential (primary) hypertension: Secondary | ICD-10-CM

## 2020-03-27 ENCOUNTER — Encounter (INDEPENDENT_AMBULATORY_CARE_PROVIDER_SITE_OTHER): Payer: Self-pay | Admitting: Otolaryngology

## 2020-03-27 ENCOUNTER — Other Ambulatory Visit: Payer: Self-pay

## 2020-03-27 ENCOUNTER — Ambulatory Visit (INDEPENDENT_AMBULATORY_CARE_PROVIDER_SITE_OTHER): Payer: Medicare Other | Admitting: Otolaryngology

## 2020-03-27 VITALS — Temp 97.7°F

## 2020-03-27 DIAGNOSIS — H6123 Impacted cerumen, bilateral: Secondary | ICD-10-CM | POA: Diagnosis not present

## 2020-03-27 DIAGNOSIS — H9313 Tinnitus, bilateral: Secondary | ICD-10-CM

## 2020-03-27 DIAGNOSIS — J310 Chronic rhinitis: Secondary | ICD-10-CM | POA: Diagnosis not present

## 2020-03-27 NOTE — Progress Notes (Signed)
HPI: [name] is a 87 y.o. female who presents for evaluation of multiple complaints of fullness in her head as well as swelling around her eyes and tearing of her eyes as well as some visual problems for which she is seeing ophthalmology.  She has sensation of fullness in her head has been on Flonase prescribed by her medical doctor.  She also complains of a ringing in her ears.  This began after falling and causing head trauma and living in an extended care facility for a while prior to being discharged and presently living at home with some day care keepers.  Of note she did have a CT scan of her head performed last year that showed clear paranasal sinuses and a deviated septum. She has never had a hearing test.  Past Medical History:  Diagnosis Date  . Arrhythmia    chronic atrial fib  . Atrial fibrillation, chronic ([place])   . Chronic anticoagulation   . Chronic atrial fibrillation ([place]) 01/28/2017  . Chronic venous insufficiency 01/28/2017  . Clavicle fracture 11/20/2017  . Complication of anesthesia    " difficult waking "  . Long term current use of anticoagulant therapy 01/28/2017  . Osteoarthritis   . Presence of permanent cardiac pacemaker 01/28/2017   Original implant 1992 Lead [employer] 431-04 Generator change 2001 and 2011.  Medtronic generator.   . SSS (sick sinus syndrome) ([place])   . Thyroid disease    hypothyroidism  . TIA (transient ischemic attack)    Past Surgical History:  Procedure Laterality Date  . ABDOMINAL HYSTERECTOMY    . INSERT / REPLACE / REMOVE PACEMAKER     generator change 2011 medtronic sigma  . KNEE SURGERY    . LEAD INSERTION N/A 03/05/2019   Procedure: LEAD INSERTION - RV LEAD;  Surgeon: Deboraha Sprang, MD;  Location: [place] [facility];  Service: Cardiovascular;  Laterality: N/A;  . PACEMAKER IMPLANT N/A 03/05/2019   Procedure: PACEMAKER IMPLANT;  Surgeon: Deboraha Sprang, MD;  Location: [place] [facility];  Service: Cardiovascular;   Laterality: N/A;  . PACEMAKER INSERTION    . PARS PLANA VITRECTOMY Right 07/19/2018   Procedure: VITRECTOMY WITH VITREOUS BIPOSY & ENDOLASER;  Surgeon: Jalene Mullet, MD;  Location: [place];  Service: Ophthalmology;  Laterality: Right;  . PPM GENERATOR CHANGEOUT N/A 02/02/2017   Procedure: PPM GENERATOR CHANGEOUT;  Surgeon: Deboraha Sprang, MD;  Location: [place] [facility];  Service: Cardiovascular;  Laterality: N/A;  . REPLACEMENT TOTAL KNEE BILATERAL    . VARICOSE VEIN SURGERY     Social History   Socioeconomic History  . Marital status: Widowed    Spouse name: Not on file  . Number of children: 2  . Years of education: 2- year college  . Highest education level: Not on file  Occupational History  . Not on file  Tobacco Use  . Smoking status: Former Smoker    Packs/day: 0.25    Years: 10.00    Pack years: 2.50    Types: Cigarettes    Quit date: 1980    Years since quitting: 42.0  . Smokeless tobacco: Never Used  Vaping Use  . Vaping Use: Never used  Substance and Sexual Activity  . Alcohol use: No  . Drug use: No  . Sexual activity: Never  Other Topics Concern  . Not on file  Social History Narrative   03/14/19   From: the area   Living: alone, but caretaker that - 20 hours  a day of care givers, is alone from 4-8 pm   Work: retired -    Widowed: husband died from mesothelioma, used to run a golf course      Family: 2 children - Linzie Collin ([place]), Maisie Fus (lives nearby), has grandchildren and Art gallery manager      Enjoys: play golf (not as much), painting, crafting    Right handed      Right handed   Exercise: yard work   Diet: anything, whatever she can find      IT sales professional belts: Yes    Guns: No   Safe in relationships: Yes    Social Determinants of Corporate investment banker Strain: Not on file  Food Insecurity: Not on file  Transportation Needs: Not on file  Physical Activity: Not on file  Stress: Not on file  Social  Connections: Not on file   Family History  Problem Relation Age of Onset  . Diabetes Mother   . Mental illness Mother   . Diabetes Father   . Heart attack Father    Allergies  Allergen Reactions  . Adhesive [Tape] Other (See Comments)    TAPE WILL TEAR THE SKIN!!!!  . Penicillins Hives and Rash    Did it involve swelling of the face/tongue/throat, SOB, or low BP? No Did it involve sudden or severe rash/hives, skin peeling, or any reaction on the inside of your mouth or nose? Yes Did you need to seek medical attention at a hospital or doctor's office? Unknown  When did it last happen?unknown  If all above answers are "NO", may proceed with cephalosporin use.    Prior to Admission medications   Medication Sig Start Date End Date Taking? Authorizing Provider  acetaminophen (TYLENOL) 500 MG tablet Take 1,000 mg by mouth every 8 (eight) hours as needed for moderate pain or headache.     [provider]  atorvastatin (LIPITOR) 20 MG tablet TAKE 1 TABLET BY MOUTH EVERY DAY IN THE EVENING 02/18/20   Lynnda Child, MD  Carboxymethylcell-Hypromellose (GENTEAL OP) Place 1 drop into both eyes daily as needed (dry eyes).    [provider]  cetirizine (ZYRTEC) 5 MG tablet TAKE 1 TABLET BY MOUTH EVERY DAY 03/13/20   Lynnda Child, MD  Cholecalciferol (VITAMIN D) 50 MCG (2000 UT) tablet Take 1 tablet (2,000 Units total) by mouth daily. 06/25/19   Lynnda Child, MD  Cyanocobalamin (B-12) 1000 MCG CAPS TAKE 1 CAPSULE BY MOUTH EVERY DAY 03/17/20   Lynnda Child, MD  donepezil (ARICEPT) 10 MG tablet Take 1/2 tablet daily for 2 weeks, then increase to 1 tablet daily 10/11/19   Van Clines, MD  ELIQUIS 5 MG TABS tablet TAKE 1 TABLET BY MOUTH TWICE A DAY 02/13/20   Lynnda Child, MD  fluticasone (FLONASE) 50 MCG/ACT nasal spray Place 1 spray into both nostrils daily as needed for allergies or rhinitis. 12/18/19   Lynnda Child, MD  levothyroxine (SYNTHROID) 88 MCG tablet  TAKE 1 TABLET BY MOUTH EVERY MORNING ON AN EMPTY STOMACH 11/06/19   Lynnda Child, MD  lisinopril (ZESTRIL) 20 MG tablet TAKE 1 TABLET BY MOUTH EVERY DAY 03/24/20   Lynnda Child, MD  oxybutynin (DITROPAN-XL) 5 MG 24 hr tablet TAKE 1 TABLET BY MOUTH EVERY DAY 08/28/19   Lynnda Child, MD  sertraline (ZOLOFT) 50 MG tablet TAKE 1 TABLET BY MOUTH EVERY DAY 11/12/19   Van Clines, MD  Positive ROS: Otherwise negative  All other systems have been reviewed and were otherwise negative with the exception of those mentioned in the HPI and as above.  Physical Exam: Constitutional: Alert, well-appearing, no acute distress Ears: External ears without lesions or tenderness.  She has minimal wax buildup in both ears worse on the right side that was cleaned with forceps.  TMs are clear otherwise with good mobility pneumatic otoscopy no middle ear effusion noted. Nasal: External nose without lesions. Septum mildly deviated with mild rhinitis.  After decongesting the nose there are no polyps no intranasal lesions noted.  Both middle meatus regions were clear with no evidence of active infection.. Clear nasal passages Oral: Lips and gums without lesions. Tongue and palate mucosa without lesions. Posterior oropharynx clear. Neck: No palpable adenopathy or masses Respiratory: Breathing comfortably  Skin: No facial/neck lesions or rash noted.  Cerumen impaction removal  Date/Time: 03/27/2020 2:18 PM Performed by: Drema Halon, MD Authorized by: Drema Halon, MD   Consent:    Consent obtained:  Verbal   Consent given by:  Patient   Risks discussed:  Pain and bleeding Procedure details:    Location:  L ear and R ear   Procedure type: forceps   Post-procedure details:    Inspection:  TM intact and canal normal   Hearing quality:  Improved   Patient tolerance of procedure:  Tolerated well, no immediate complications Comments:     She had minimal wax buildup in both ears right side  worse than left.  TMs are clear bilaterally.    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 Head fullness questionable etiology.  No evidence of acute sinus infection.  She does have mild rhinitis with clear mucus discharge.  Plan: Recommend continue use of the Flonase 2 sprays each nostril at night and recommended use of saline irrigation during the daytime if she has much mucus drainage or production. We will plan on scheduling audiologic testing to evaluate her hearing and have her follow-up here following the audiologic testing.  Narda Bonds, MD

## 2020-03-31 IMAGING — DX DG CHEST 1V PORT
1 series · 1 of 1 positions shown · non-contrast
Comparison: Chest x-ray dated November 10, 2017.

CLINICAL DATA: Fall.  Right clavicle pain.

EXAM:
PORTABLE CHEST 1 VIEW

[chest]
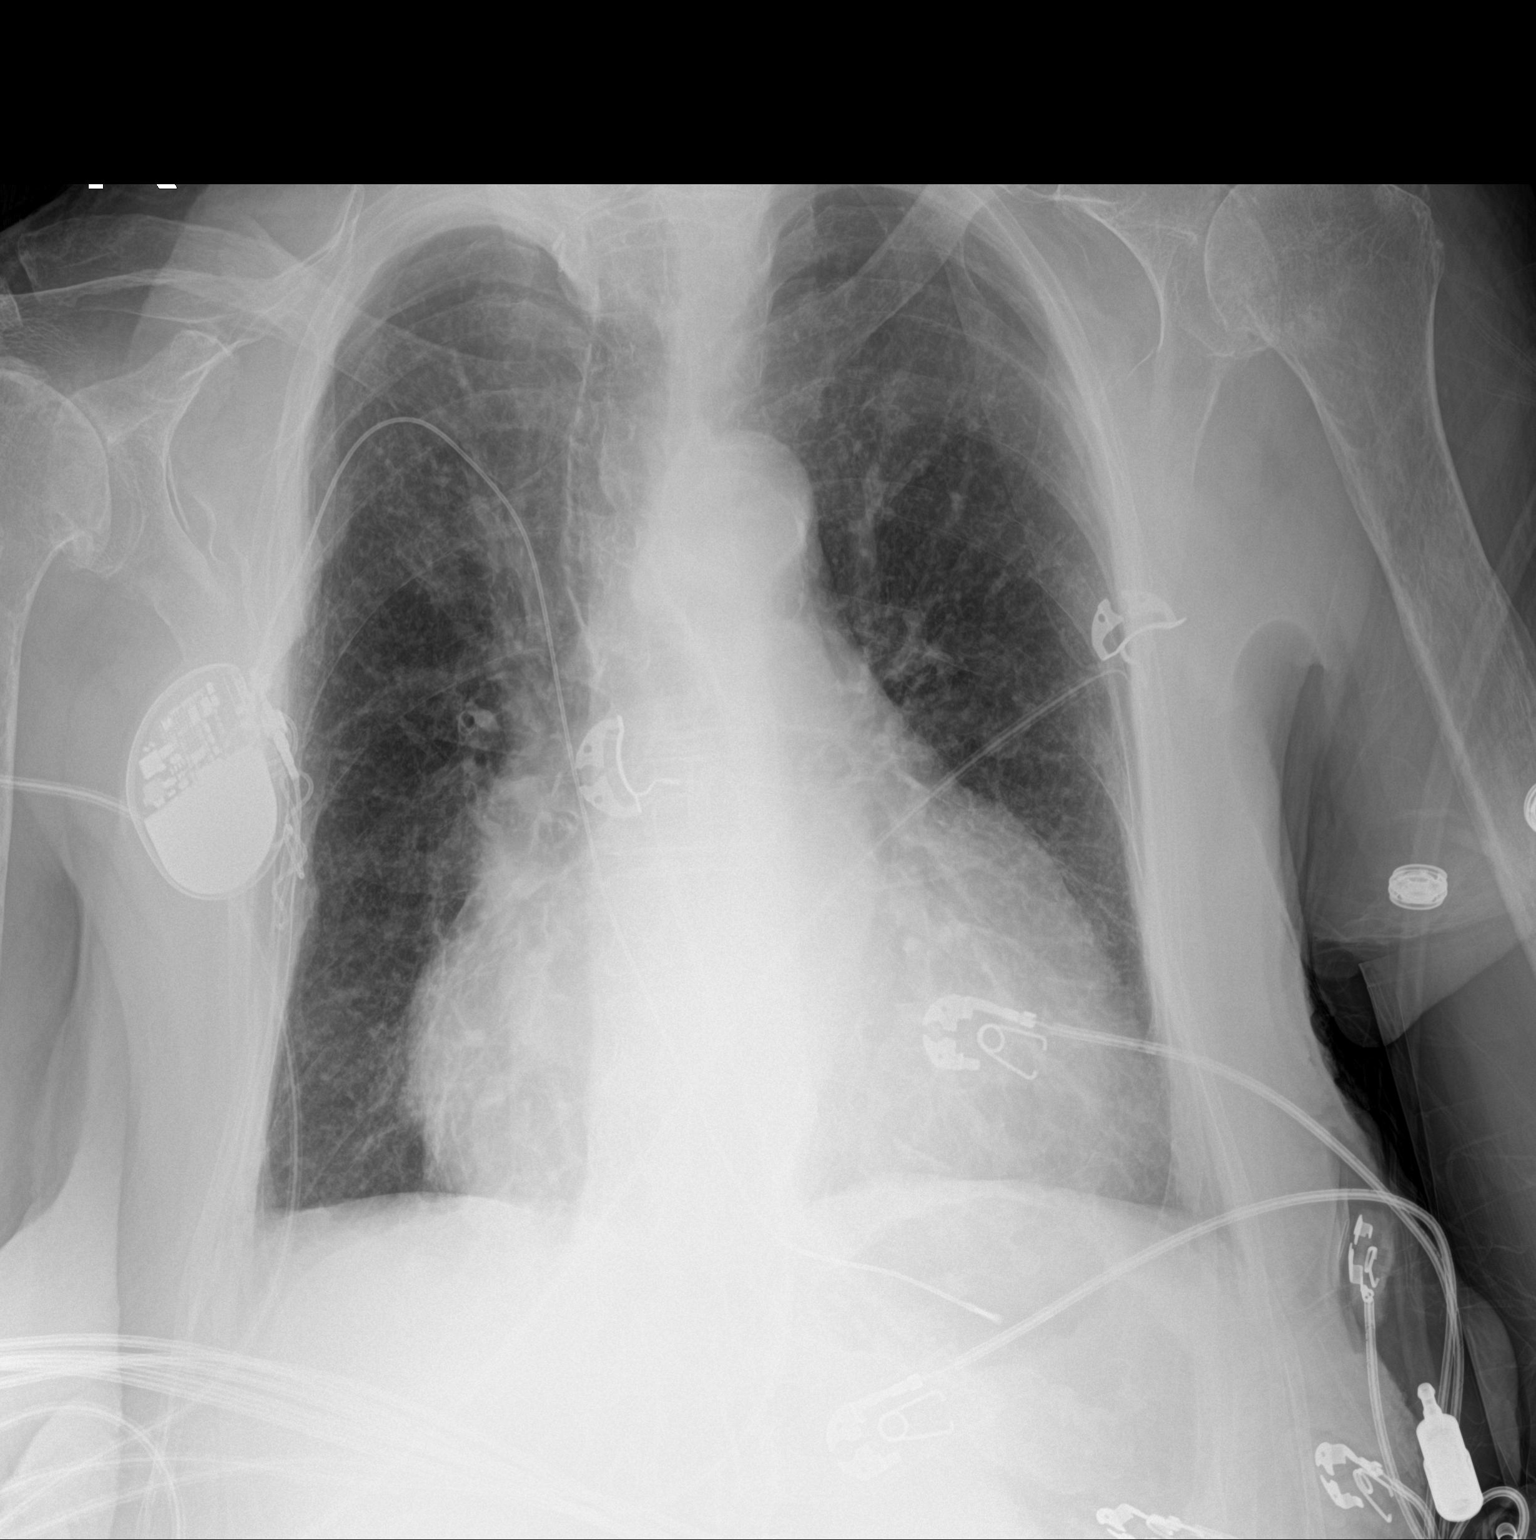

[1 of 1 positions shown; findings below may reference images not displayed]

FINDINGS: Unchanged single lead right chest wall pacemaker. Stable
cardiomegaly. Atherosclerotic calcification of the aortic arch.
Normal pulmonary vascularity. The lungs remain mildly hyperinflated.
Chronic interstitial changes are similar to prior study. No focal
consolidation, pleural effusion, or pneumothorax. Acute fracture of
the right mid to distal clavicle with almost 2 cm of overlapping
fragments.
IMPRESSION: 1. Acute right mid to distal clavicle fracture with 2 cm of
overlapping fragments.
2. COPD and chronic interstitial changes. No active cardiopulmonary
disease.

## 2020-04-01 IMAGING — DX DG WRIST COMPLETE 3+V*R*
1 series · 4 of 4 positions shown · non-contrast
Comparison: None.

CLINICAL DATA: Wrist pain after fall

EXAM:
RIGHT WRIST - COMPLETE 3+ VIEW

[Series 1: wrist · 0.14mm/px · 4 of 4 slices shown]
[im 1/4]
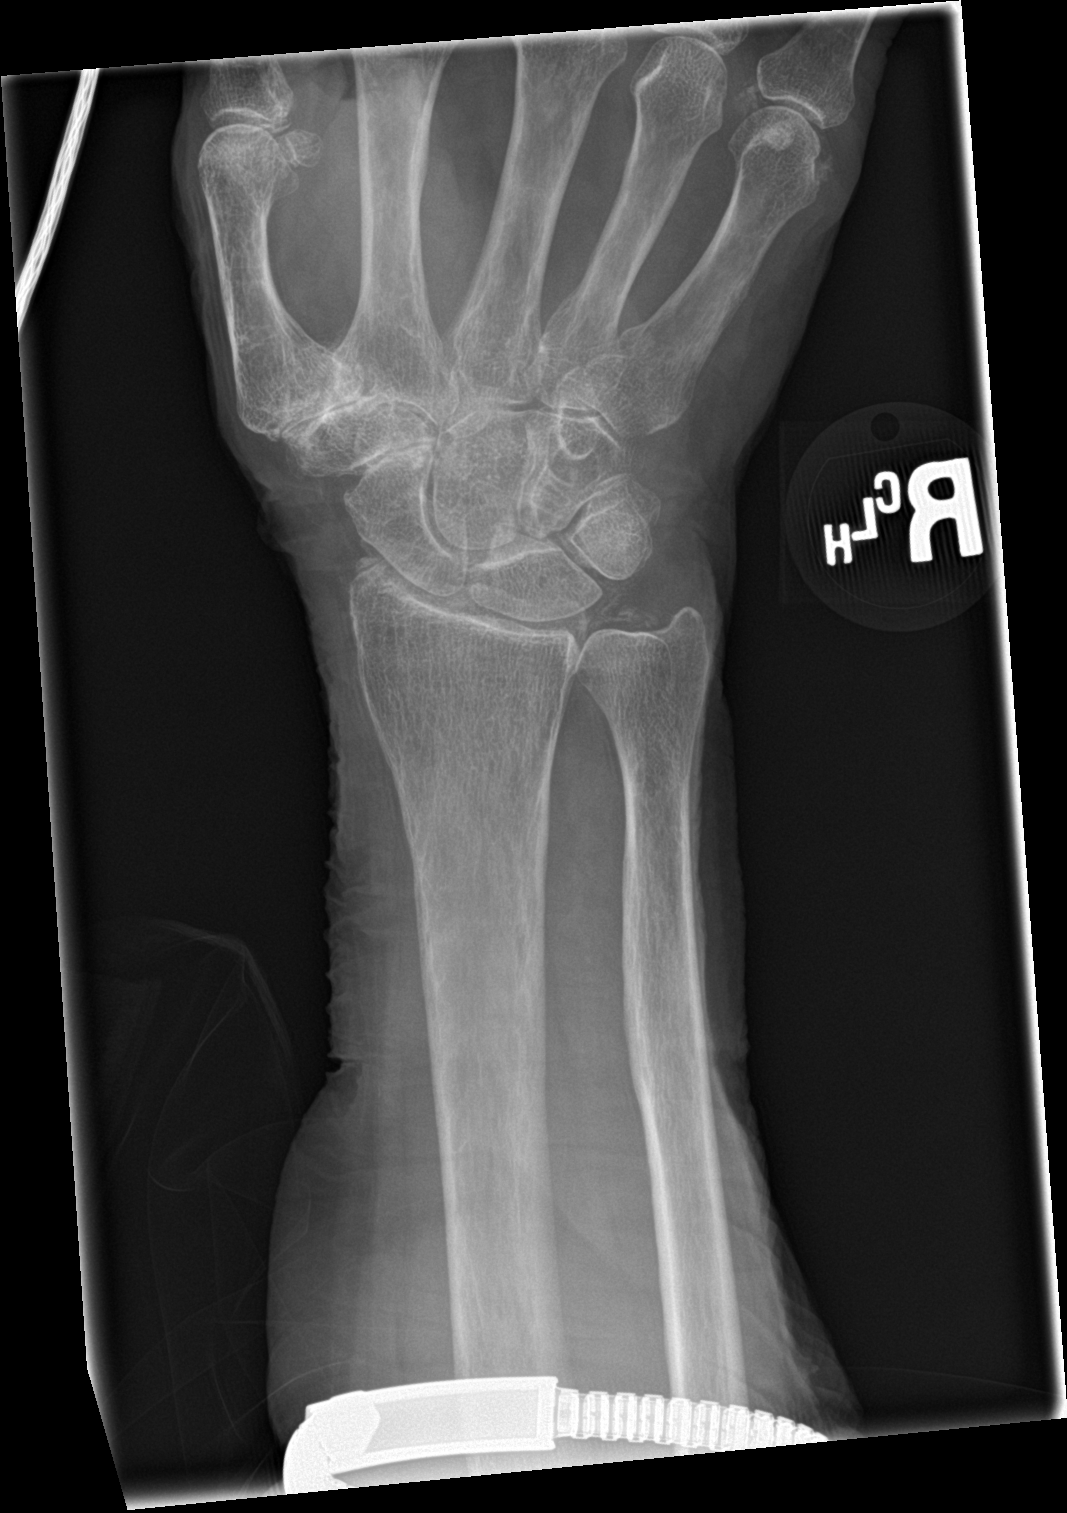
[im 2/4]
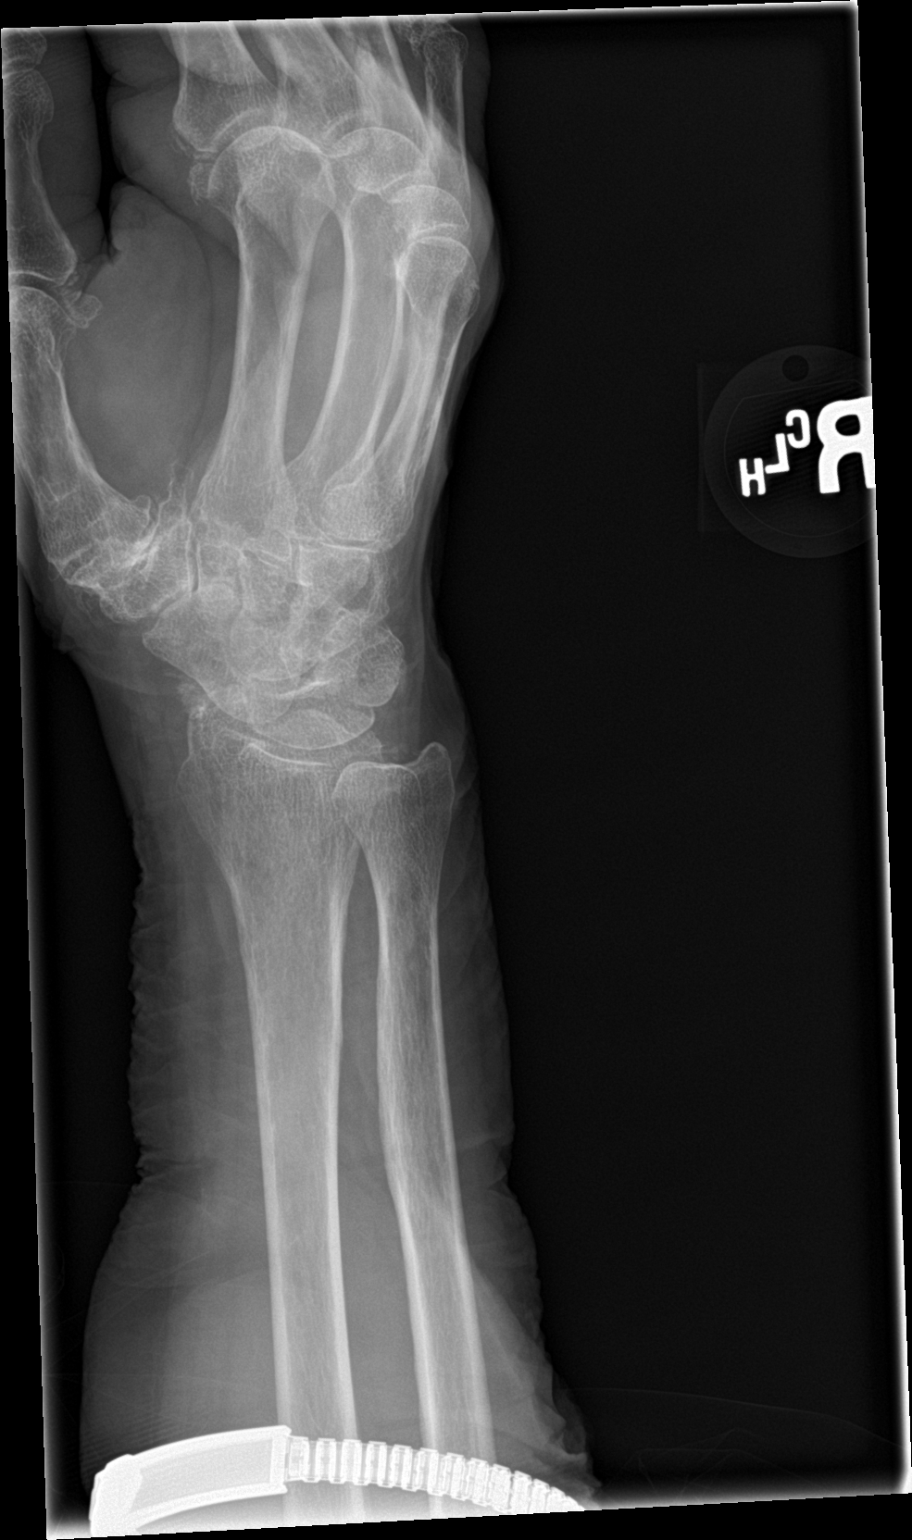
[im 3/4]
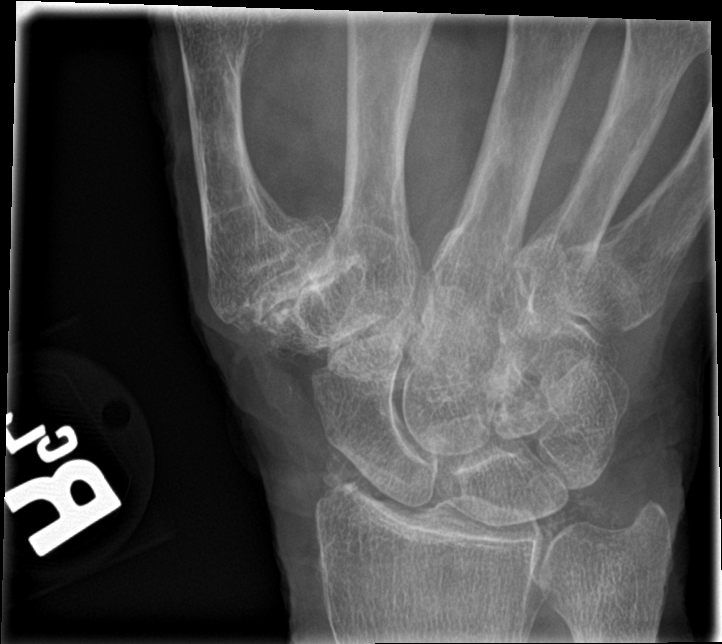
[im 4/4]
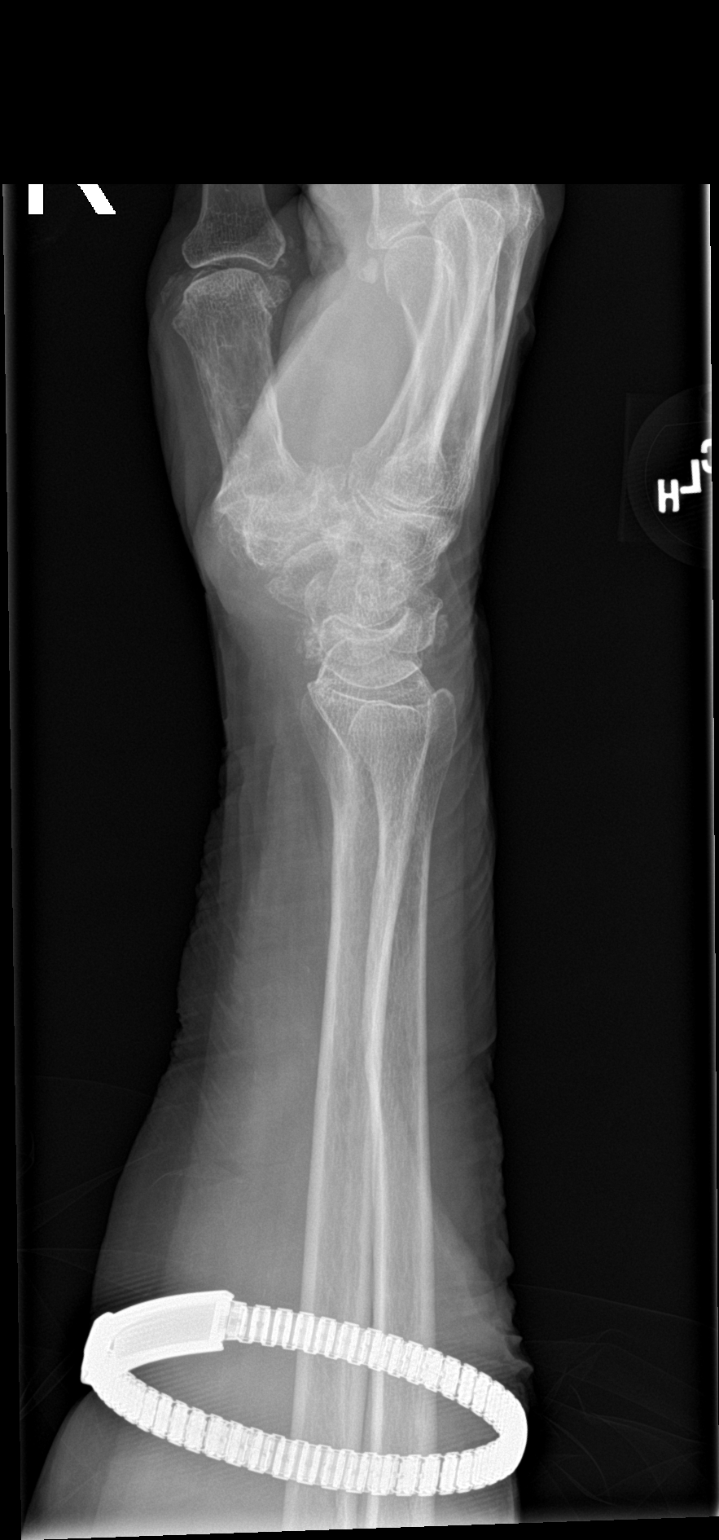

[4 of 4 positions shown; findings below may reference images not displayed]

FINDINGS: No fracture or malalignment. Arthritis at the distal radial carpal
interval. Cartilaginous calcifications. Moderate-to-marked arthritis
at the first CMC joint.
IMPRESSION: 1. No acute osseous abnormality
2. Moderate-to-marked arthritis first CMC joint.
3. Chondrocalcinosis

## 2020-04-08 ENCOUNTER — Ambulatory Visit (INDEPENDENT_AMBULATORY_CARE_PROVIDER_SITE_OTHER): Payer: Medicare Other

## 2020-04-08 VITALS — BP 131/98 | HR 63

## 2020-04-08 DIAGNOSIS — Z0000 Encounter for general adult medical examination without abnormal findings: Secondary | ICD-10-CM

## 2020-04-08 NOTE — Patient Instructions (Signed)
[name] , Thank you for taking time to come for your Medicare Wellness Visit. I appreciate your ongoing commitment to your health goals. Please review the following plan we discussed and let me know if I can assist you in the future.   Screening recommendations/referrals: Colonoscopy: no longer required  Mammogram: no longer required  Bone Density: due, will discuss with provider at physical  Recommended yearly ophthalmology/optometry visit for glaucoma screening and checkup Recommended yearly dental visit for hygiene and checkup  Vaccinations: Influenza vaccine: Up to date, completed 12/07/2019, due 10/2020 Pneumococcal vaccine: Completed series Tdap vaccine: decline-insurance Shingles vaccine: due, check with your insurance regarding coverage if interested    Covid-19:Completed series  Advanced directives: Please bring a copy of your POA (Power of Attorney) and/or Living Will to your next appointment.   Conditions/risks identified: hypertension  Next appointment: Follow up in one year for your annual wellness visit    Preventive Care 85 Years and Older, Female Preventive care refers to lifestyle choices and visits with your health care provider that can promote health and wellness. What does preventive care include?  A yearly physical exam. This is also called an annual well check.  Dental exams once or twice a year.  Routine eye exams. Ask your health care provider how often you should have your eyes checked.  Personal lifestyle choices, including:  Daily care of your teeth and gums.  Regular physical activity.  Eating a healthy diet.  Avoiding tobacco and drug use.  Limiting alcohol use.  Practicing safe sex.  Taking low-dose aspirin every day.  Taking vitamin and mineral supplements as recommended by your health care provider. What happens during an annual well check? The services and screenings done by your health care provider during your annual well  check will depend on your age, overall health, lifestyle risk factors, and family history of disease. Counseling  Your health care provider may ask you questions about your:  Alcohol use.  Tobacco use.  Drug use.  Emotional well-being.  Home and relationship well-being.  Sexual activity.  Eating habits.  History of falls.  Memory and ability to understand (cognition).  Work and work Statistician.  Reproductive health. Screening  You may have the following tests or measurements:  Height, weight, and BMI.  Blood pressure.  Lipid and cholesterol levels. These may be checked every 5 years, or more frequently if you are over 39 years old.  Skin check.  Lung cancer screening. You may have this screening every year starting at age 79 if you have a 30-pack-year history of smoking and currently smoke or have quit within the past 15 years.  Fecal occult blood test (FOBT) of the stool. You may have this test every year starting at age 14.  Flexible sigmoidoscopy or colonoscopy. You may have a sigmoidoscopy every 5 years or a colonoscopy every 10 years starting at age 1.  Hepatitis C blood test.  Hepatitis B blood test.  Sexually transmitted disease (STD) testing.  Diabetes screening. This is done by checking your blood sugar (glucose) after you have not eaten for a while (fasting). You may have this done every 1-3 years.  Bone density scan. This is done to screen for osteoporosis. You may have this done starting at age 39.  Mammogram. This may be done every 1-2 years. Talk to your health care provider about how often you should have regular mammograms. Talk with your health care provider about your test results, treatment options, and if necessary, the need  for more tests. Vaccines  Your health care provider may recommend certain vaccines, such as:  Influenza vaccine. This is recommended every year.  Tetanus, diphtheria, and acellular pertussis (Tdap, Td) vaccine. You  may need a Td booster every 10 years.  Zoster vaccine. You may need this after age 34.  Pneumococcal 13-valent conjugate (PCV13) vaccine. One dose is recommended after age 61.  Pneumococcal polysaccharide (PPSV23) vaccine. One dose is recommended after age 28. Talk to your health care provider about which screenings and vaccines you need and how often you need them. This information is not intended to replace advice given to you by your health care provider. Make sure you discuss any questions you have with your health care provider. Document Released: 04/04/2015 Document Revised: 11/26/2015 Document Reviewed: 01/07/2015 Elsevier Interactive Patient Education  2017 [place] Prevention in the Home Falls can cause injuries. They can happen to people of all ages. There are many things you can do to make your home safe and to help prevent falls. What can I do on the outside of my home?  Regularly fix the edges of walkways and driveways and fix any cracks.  Remove anything that might make you trip as you walk through a door, such as a raised step or threshold.  Trim any bushes or trees on the path to your home.  Use bright outdoor lighting.  Clear any walking paths of anything that might make someone trip, such as rocks or tools.  Regularly check to see if handrails are loose or broken. Make sure that both sides of any steps have handrails.  Any raised decks and porches should have guardrails on the edges.  Have any leaves, snow, or ice cleared regularly.  Use sand or salt on walking paths during winter.  Clean up any spills in your garage right away. This includes oil or grease spills. What can I do in the bathroom?  Use night lights.  Install grab bars by the toilet and in the tub and shower. Do not use towel bars as grab bars.  Use non-skid mats or decals in the tub or shower.  If you need to sit down in the shower, use a plastic, non-slip stool.  Keep the floor  dry. Clean up any water that spills on the floor as soon as it happens.  Remove soap buildup in the tub or shower regularly.  Attach bath mats securely with double-sided non-slip rug tape.  Do not have throw rugs and other things on the floor that can make you trip. What can I do in the bedroom?  Use night lights.  Make sure that you have a light by your bed that is easy to reach.  Do not use any sheets or blankets that are too big for your bed. They should not hang down onto the floor.  Have a firm chair that has side arms. You can use this for support while you get dressed.  Do not have throw rugs and other things on the floor that can make you trip. What can I do in the kitchen?  Clean up any spills right away.  Avoid walking on wet floors.  Keep items that you use a lot in easy-to-reach places.  If you need to reach something above you, use a strong step stool that has a grab bar.  Keep electrical cords out of the way.  Do not use floor polish or wax that makes floors slippery. If you must use wax, use  non-skid floor wax.  Do not have throw rugs and other things on the floor that can make you trip. What can I do with my stairs?  Do not leave any items on the stairs.  Make sure that there are handrails on both sides of the stairs and use them. Fix handrails that are broken or loose. Make sure that handrails are as long as the stairways.  Check any carpeting to make sure that it is firmly attached to the stairs. Fix any carpet that is loose or worn.  Avoid having throw rugs at the top or bottom of the stairs. If you do have throw rugs, attach them to the floor with carpet tape.  Make sure that you have a light switch at the top of the stairs and the bottom of the stairs. If you do not have them, ask someone to add them for you. What else can I do to help prevent falls?  Wear shoes that:  Do not have high heels.  Have rubber bottoms.  Are comfortable and fit you  well.  Are closed at the toe. Do not wear sandals.  If you use a stepladder:  Make sure that it is fully opened. Do not climb a closed stepladder.  Make sure that both sides of the stepladder are locked into place.  Ask someone to hold it for you, if possible.  Clearly mark and make sure that you can see:  Any grab bars or handrails.  First and last steps.  Where the edge of each step is.  Use tools that help you move around (mobility aids) if they are needed. These include:  Canes.  Walkers.  Scooters.  Crutches.  Turn on the lights when you go into a dark area. Replace any light bulbs as soon as they burn out.  Set up your furniture so you have a clear path. Avoid moving your furniture around.  If any of your floors are uneven, fix them.  If there are any pets around you, be aware of where they are.  Review your medicines with your doctor. Some medicines can make you feel dizzy. This can increase your chance of falling. Ask your doctor what other things that you can do to help prevent falls. This information is not intended to replace advice given to you by your health care provider. Make sure you discuss any questions you have with your health care provider. Document Released: 01/02/2009 Document Revised: 08/14/2015 Document Reviewed: 04/12/2014 Elsevier Interactive Patient Education  2017 [employer].

## 2020-04-08 NOTE — Progress Notes (Signed)
Subjective:   [name] is a 87 y.o. female who presents for Medicare Annual (Subsequent) preventive examination.  Review of Systems: N/A     I connected with the patient today by telephone and verified that I am speaking with the correct person using two identifiers. Location patient: home Location nurse: work Persons participating in the telephone visit: patient, nurse.   I discussed the limitations, risks, security and privacy concerns of performing an evaluation and management service by telephone and the availability of in person appointments. I also discussed with the patient that there may be a patient responsible charge related to this service. The patient expressed understanding and verbally consented to this telephonic visit.        Cardiac Risk Factors include: advanced age (>34men, >93 women);hypertension     Objective:    Today's Vitals   04/08/20 0854  BP: (!) 131/98  Pulse: 63   There is no height or weight on file to calculate BMI.  Advanced Directives 04/08/2020 08/02/2019 06/19/2019 03/05/2019 07/19/2018 11/20/2017 02/02/2017  Does Patient Have a Medical Advance Directive? Yes Yes Yes No No No No  Type of Paramedic of [place];Living will [place];Living will [place];Living will - - - -  Does patient want to make changes to medical advance directive? - No - Patient declined - - - - -  Copy of [place] in Chart? No - copy requested No - copy requested - - - - -  Would patient like information on creating a medical advance directive? - - - No - Patient declined No - Patient declined No - Patient declined No - Patient declined    Current Medications (verified) Outpatient Encounter Medications as of 04/08/2020  Medication Sig  . acetaminophen (TYLENOL) 500 MG tablet Take 1,000 mg by mouth every 8 (eight) hours as needed for moderate pain or headache.   Marland Kitchen atorvastatin  (LIPITOR) 20 MG tablet TAKE 1 TABLET BY MOUTH EVERY DAY IN THE EVENING  . Carboxymethylcell-Hypromellose (GENTEAL OP) Place 1 drop into both eyes daily as needed (dry eyes).  . cetirizine (ZYRTEC) 5 MG tablet TAKE 1 TABLET BY MOUTH EVERY DAY  . Cholecalciferol (VITAMIN D) 50 MCG (2000 UT) tablet Take 1 tablet (2,000 Units total) by mouth daily.  . Cyanocobalamin (B-12) 1000 MCG CAPS TAKE 1 CAPSULE BY MOUTH EVERY DAY  . donepezil (ARICEPT) 10 MG tablet Take 1/2 tablet daily for 2 weeks, then increase to 1 tablet daily  . ELIQUIS 5 MG TABS tablet TAKE 1 TABLET BY MOUTH TWICE A DAY  . fluticasone (FLONASE) 50 MCG/ACT nasal spray Place 1 spray into both nostrils daily as needed for allergies or rhinitis.  Marland Kitchen levothyroxine (SYNTHROID) 88 MCG tablet TAKE 1 TABLET BY MOUTH EVERY MORNING ON AN EMPTY STOMACH  . lisinopril (ZESTRIL) 20 MG tablet TAKE 1 TABLET BY MOUTH EVERY DAY  . oxybutynin (DITROPAN-XL) 5 MG 24 hr tablet TAKE 1 TABLET BY MOUTH EVERY DAY  . sertraline (ZOLOFT) 50 MG tablet TAKE 1 TABLET BY MOUTH EVERY DAY  . [DISCONTINUED] cetirizine (ZYRTEC) 5 MG tablet TAKE 1 TABLET BY MOUTH EVERY DAY  . [DISCONTINUED] Cyanocobalamin (B-12) 1000 MCG CAPS TAKE 1 CAPSULE BY MOUTH EVERY DAY  . [DISCONTINUED] lisinopril (ZESTRIL) 20 MG tablet Take 1 tablet (20 mg total) by mouth daily.   No facility-administered encounter medications on file as of 04/08/2020.    Allergies (verified) Adhesive [tape] and Penicillins   History:  Past Medical History:  Diagnosis Date  . Arrhythmia    chronic atrial fib  . Atrial fibrillation, chronic ([place])   . Chronic anticoagulation   . Chronic atrial fibrillation ([place]) 01/28/2017  . Chronic venous insufficiency 01/28/2017  . Clavicle fracture 11/20/2017  . Complication of anesthesia    " difficult waking "  . Long term current use of anticoagulant therapy 01/28/2017  . Osteoarthritis   . Presence of permanent cardiac pacemaker 01/28/2017   Original implant 1992 Lead  Intermedics 431-04 Generator change 2001 and 2011.  Medtronic generator.   . SSS (sick sinus syndrome) ([place])   . Thyroid disease    hypothyroidism  . TIA (transient ischemic attack)    Past Surgical History:  Procedure Laterality Date  . ABDOMINAL HYSTERECTOMY    . INSERT / REPLACE / REMOVE PACEMAKER     generator change 2011 medtronic sigma  . KNEE SURGERY    . LEAD INSERTION N/A 03/05/2019   Procedure: LEAD INSERTION - RV LEAD;  Surgeon: Deboraha Sprang, MD;  Location: [facility];  Service: Cardiovascular;  Laterality: N/A;  . PACEMAKER IMPLANT N/A 03/05/2019   Procedure: PACEMAKER IMPLANT;  Surgeon: Deboraha Sprang, MD;  Location: [facility];  Service: Cardiovascular;  Laterality: N/A;  . PACEMAKER INSERTION    . PARS PLANA VITRECTOMY Right 07/19/2018   Procedure: VITRECTOMY WITH VITREOUS BIPOSY & ENDOLASER;  Surgeon: Jalene Mullet, MD;  Location: [facility];  Service: Ophthalmology;  Laterality: Right;  . PPM GENERATOR CHANGEOUT N/A 02/02/2017   Procedure: PPM GENERATOR CHANGEOUT;  Surgeon: Deboraha Sprang, MD;  Location: [facility];  Service: Cardiovascular;  Laterality: N/A;  . REPLACEMENT TOTAL KNEE BILATERAL    . VARICOSE VEIN SURGERY     Family History  Problem Relation Age of Onset  . Diabetes Mother   . Mental illness Mother   . Diabetes Father   . Heart attack Father    Social History   Socioeconomic History  . Marital status: Widowed    Spouse name: Not on file  . Number of children: 2  . Years of education: 2- year college  . Highest education level: Not on file  Occupational History  . Not on file  Tobacco Use  . Smoking status: Former Smoker    Packs/day: 0.25    Years: 10.00    Pack years: 2.50    Types: Cigarettes    Quit date: 1980    Years since quitting: 42.0  . Smokeless tobacco: Never Used  Vaping Use  . Vaping Use: Never used  Substance and Sexual Activity  . Alcohol use: No  . Drug use: No  . Sexual activity: Never   Other Topics Concern  . Not on file  Social History Narrative   03/14/19   From: the area   Living: alone, but caretaker that - 20 hours a day of care givers, is alone from 4-8 pm   Work: retired -    Widowed: husband died from [place], used to run a golf course      Family: 2 children - [name] ([place]), Marcello Moores (lives nearby), has grandchildren and Designer, industrial/product      Enjoys: play golf (not as much), painting, crafting    Right handed      Right handed   Exercise: yard work   Diet: anything, whatever she can find      Safety   Seat belts: Yes    Guns: No  Safe in relationships: Yes    Social Determinants of Health   Financial Resource Strain: Low Risk   . Difficulty of Paying Living Expenses: Not hard at all  Food Insecurity: No Food Insecurity  . Worried About Charity fundraiser in the Last Year: Never true  . Ran Out of Food in the Last Year: Never true  Transportation Needs: No Transportation Needs  . Lack of Transportation (Medical): No  . Lack of Transportation (Non-Medical): No  Physical Activity: Inactive  . Days of Exercise per Week: 0 days  . Minutes of Exercise per Session: 0 min  Stress: No Stress Concern Present  . Feeling of Stress : Not at all  Social Connections: Not on file    Tobacco Counseling Counseling given: Not Answered   Clinical Intake:  Pre-visit preparation completed: Yes  Pain : No/denies pain     Nutritional Risks: None Diabetes: No  How often do you need to have someone help you when you read instructions, pamphlets, or other written materials from your doctor or pharmacy?: 1 - Never What is the last grade level you completed in school?: college  Diabetic: No Nutrition Risk Assessment:  Has the patient had any N/V/D within the last 2 months?  No  Does the patient have any non-healing wounds?  No  Has the patient had any unintentional weight loss or weight gain?  No   Diabetes:  Is the  patient diabetic?  No  If diabetic, was a CBG obtained today?  N/A Did the patient bring in their glucometer from home?  N/A How often do you monitor your CBG's? N/A.   Financial Strains and Diabetes Management:  Are you having any financial strains with the device, your supplies or your medication? N/A.  Does the patient want to be seen by Chronic Care Management for management of their diabetes?  N/A Would the patient like to be referred to a Nutritionist or for Diabetic Management?  N/A   Interpreter Needed?: No  Information entered by :: CJohnson, LPN   Activities of Daily Living In your present state of health, do you have any difficulty performing the following activities: 04/08/2020  Hearing? Y  Comment hearing loss noted  Vision? N  Difficulty concentrating or making decisions? Y  Comment has dementia, followed by neurology  Walking or climbing stairs? N  Dressing or bathing? N  Doing errands, shopping? N  Preparing Food and eating ? N  Using the Toilet? N  In the past six months, have you accidently leaked urine? Y  Comment only when she holds it for a long time  Do you have problems with loss of bowel control? N  Managing your Medications? N  Managing your Finances? N  Housekeeping or managing your Housekeeping? N  Some recent data might be hidden    Patient Care Team: [name], MD as PCP - General (Family Medicine) Debbora Dus, [facility] as Pharmacist (Pharmacist) Cameron Sprang, MD as Consulting Physician (Neurology)  Indicate any recent Medical Services you may have received from other than [facility] providers in the past year (date may be approximate).     Assessment:   This is a routine wellness examination for Feyza.  Hearing/Vision screen  Hearing Screening   125Hz  250Hz  500Hz  1000Hz  2000Hz  3000Hz  4000Hz  6000Hz  8000Hz   Right ear:           Left ear:           Vision Screening Comments: Patient gets annual eye  exams   Dietary issues and  exercise activities discussed: Current Exercise Habits: Home exercise routine, Type of exercise: strength training/weights, Time (Minutes): 15, Frequency (Times/Week): 3, Weekly Exercise (Minutes/Week): 45, Intensity: Mild, Exercise limited by: None identified  Goals    . Patient Stated     04/08/2020, I will continue to do small strength training exercises at home for about 15 minutes 2-3 days a week.     Marland Kitchen Pharmacy Care Plan     CARE PLAN ENTRY  Current Barriers:  . Chronic Disease Management support, education, and care coordination needs related to Hypertension and Hyperlipidemia   Hypertension BP Readings from Last 3 Encounters:  07/31/19 (!) 170/80  07/30/19 (!) 168/98  06/19/19 135/72 .  Pharmacist Clinical Goal(s): o Over the next 14 days, patient will work with PharmD and providers to achieve BP goal <140/90 mmHg . Current regimen:  o Lisinopril 20 mg - 1 tablet daily . Interventions: o Recommend home blood pressure monitoring  . Patient self care activities - Over the next 14 days, patient will: o Check blood pressure daily before breakfast, document, and provide at future appointment  Hyperlipidemia Lab Results  Component Value Date/Time   LDLCALC 120 (H) 12/12/2018 08:00 AM .  Pharmacist Clinical Goal(s): o Over the next 3 months, patient will work with PharmD and providers to achieve LDL goal < 100 . Current regimen:  o Atorvastatin 20 mg - 1 tablet daily in the evening . Interventions: o Comprehensive medication review . Patient self care activities - Over the next 3 months, patient will: o Continue to take medication each day as prescribed  Please see past updates related to this goal by clicking on the "Past Updates" button in the selected goal       Depression Screen PHQ 2/9 Scores 04/08/2020 03/14/2019  PHQ - 2 Score 0 0  PHQ- 9 Score 0 -    Fall Risk Fall Risk  04/08/2020 10/11/2019 10/11/2019 06/19/2019  Falls in the past year? 1 0 0 1  Number falls  in past yr: 0 0 0 1  Injury with Fall? 0 0 0 1  Risk for fall due to : Medication side effect - - Impaired balance/gait;Impaired mobility;History of fall(s)  Follow up Falls evaluation completed;Falls prevention discussed - - -    FALL RISK PREVENTION PERTAINING TO THE HOME:  Any stairs in or around the home? Yes  If so, are there any without handrails? No  Home free of loose throw rugs in walkways, pet beds, electrical cords, etc? Yes  Adequate lighting in your home to reduce risk of falls? Yes   ASSISTIVE DEVICES UTILIZED TO PREVENT FALLS:  Life alert? No  Use of a cane, walker or w/c? Yes  Grab bars in the bathroom? Yes  Shower chair or bench in shower? Yes  Elevated toilet seat or a handicapped toilet? Yes   TIMED UP AND GO:  Was the test performed? N/A telephone visit .   Cognitive Function: MMSE - Mini Mental State Exam 04/08/2020 07/18/2019  Not completed: Refused -  Orientation to time - 2  Orientation to Place - 4  Registration - 3  Attention/ Calculation - 5  Recall - 0  Language- name 2 objects - 2  Language- repeat - 1  Language- follow 3 step command - 2  Language- read & follow direction - 1  Write a sentence - 1  Copy design - 0  Total score - 21  Cognitive status assessed by direct  observation. Patient has current diagnosis of cognitive impairment. Patient is followed by neurology for ongoing assessment. Patient is unable to complete screening 6CIT or MMSE.    Mini Cog  Mini-Cog screen was not completed. Patient has dementia. Maximum score is 22. A value of 0 denotes this part of the MMSE was not completed or the patient failed this part of the Mini-Cog screening.       Immunizations Immunization History  Administered Date(s) Administered  . Influenza, High Dose Seasonal PF 11/16/2018  . Influenza-Unspecified 11/20/2013  . PFIZER(Purple Top)SARS-COV-2 Vaccination 05/13/2019, 06/06/2019, 12/25/2019  . Pneumococcal Conjugate-13 03/14/2019  .  Pneumococcal Polysaccharide-23 12/08/2012    TDAP status: Due, Education has been provided regarding the importance of this vaccine. Advised may receive this vaccine at local pharmacy or Health Dept. Aware to provide a copy of the vaccination record if obtained from local pharmacy or Health Dept. Verbalized acceptance and understanding.  Flu Vaccine status: Up to date  Pneumococcal vaccine status: Up to date  Covid-19 vaccine status: Completed vaccines  Qualifies for Shingles Vaccine? Yes   Zostavax completed No   Shingrix Completed?: No.    Education has been provided regarding the importance of this vaccine. Patient has been advised to call insurance company to determine out of pocket expense if they have not yet received this vaccine. Advised may also receive vaccine at local pharmacy or Health Dept. Verbalized acceptance and understanding.  Screening Tests Health Maintenance  Topic Date Due  . DEXA SCAN  Never done  . TETANUS/TDAP  04/09/2023 (Originally 11/05/1949)  . INFLUENZA VACCINE  Completed  . COVID-19 Vaccine  Completed  . PNA vac Low Risk Adult  Completed    Health Maintenance  Health Maintenance Due  Topic Date Due  . DEXA SCAN  Never done    Colorectal cancer screening: No longer required.   Mammogram status: No longer required due to age.  Bone Density status: due, will discuss with provider at physical   Lung Cancer Screening: (Low Dose CT Chest recommended if Age 50-80 years, 30 pack-year currently smoking OR have quit w/in 15 years.) does not qualify.    Additional Screening:  Hepatitis C Screening: does not qualify; Completed N/A  Vision Screening: Recommended annual ophthalmology exams for early detection of glaucoma and other disorders of the eye. Is the patient up to date with their annual eye exam?  Yes  Who is the provider or what is the name of the office in which the patient attends annual eye exams? Dr. Posey Pronto If pt is not established with a  provider, would they like to be referred to a provider to establish care? No .   Dental Screening: Recommended annual dental exams for proper oral hygiene  Community Resource Referral / Chronic Care Management: CRR required this visit?  No   CCM required this visit?  No      Plan:     I have personally reviewed and noted the following in the patient's chart:   . Medical and social history . Use of alcohol, tobacco or illicit drugs  . Current medications and supplements . Functional ability and status . Nutritional status . Physical activity . Advanced directives . List of other physicians . Hospitalizations, surgeries, and ER visits in previous 12 months . Vitals . Screenings to include cognitive, depression, and falls . Referrals and appointments  In addition, I have reviewed and discussed with patient certain preventive protocols, quality metrics, and best practice recommendations. A written personalized care plan  for preventive services as well as general preventive health recommendations were provided to patient.    Due to this being a telephonic visit, the after visit summary with patients personalized plan was offered to patient via office or my-chart. Patient preferred to pick up at office at next visit or via mychart.   Andrez Grime, LPN   [id number]

## 2020-04-08 NOTE — Progress Notes (Signed)
PCP notes:  Health Maintenance: Dexa- due Tdap- insurance   Abnormal Screenings: none   Patient concerns: Moles on back and neck Stye on right eye    Nurse concerns: none   Next PCP appt.: 04/10/2020 @ 9:20 am

## 2020-04-10 ENCOUNTER — Other Ambulatory Visit: Payer: Self-pay

## 2020-04-10 ENCOUNTER — Encounter: Payer: Self-pay | Admitting: Family Medicine

## 2020-04-10 ENCOUNTER — Ambulatory Visit (INDEPENDENT_AMBULATORY_CARE_PROVIDER_SITE_OTHER): Payer: Medicare Other | Admitting: Family Medicine

## 2020-04-10 VITALS — BP 128/70 | HR 71 | Temp 97.2°F | Ht 61.0 in | Wt 146.5 lb

## 2020-04-10 DIAGNOSIS — R0981 Nasal congestion: Secondary | ICD-10-CM

## 2020-04-10 DIAGNOSIS — H02403 Unspecified ptosis of bilateral eyelids: Secondary | ICD-10-CM | POA: Diagnosis not present

## 2020-04-10 DIAGNOSIS — Z0000 Encounter for general adult medical examination without abnormal findings: Secondary | ICD-10-CM

## 2020-04-10 DIAGNOSIS — F0390 Unspecified dementia without behavioral disturbance: Secondary | ICD-10-CM | POA: Diagnosis not present

## 2020-04-10 DIAGNOSIS — R7303 Prediabetes: Secondary | ICD-10-CM | POA: Diagnosis not present

## 2020-04-10 DIAGNOSIS — I482 Chronic atrial fibrillation, unspecified: Secondary | ICD-10-CM | POA: Diagnosis not present

## 2020-04-10 DIAGNOSIS — E782 Mixed hyperlipidemia: Secondary | ICD-10-CM | POA: Diagnosis not present

## 2020-04-10 DIAGNOSIS — E2839 Other primary ovarian failure: Secondary | ICD-10-CM | POA: Diagnosis not present

## 2020-04-10 LAB — COMPREHENSIVE METABOLIC PANEL
ALT: 15 U/L (ref 0–35)
AST: 20 U/L (ref 0–37)
Albumin: 4.4 g/dL (ref 3.5–5.2)
Alkaline Phosphatase: 64 U/L (ref 39–117)
BUN: 21 mg/dL (ref 6–23)
CO2: 31 mEq/L (ref 19–32)
Calcium: 9.6 mg/dL (ref 8.4–10.5)
Chloride: 100 mEq/L (ref 96–112)
Creatinine, Ser: 1.27 mg/dL — ABNORMAL HIGH (ref 0.40–1.20)
GFR: 38.04 mL/min — ABNORMAL LOW (ref >60.00)
Glucose, Bld: 107 mg/dL — ABNORMAL HIGH (ref 70–99)
Potassium: 4 mEq/L (ref 3.5–5.1)
Sodium: 136 mEq/L (ref 135–145)
Total Bilirubin: 1 mg/dL (ref 0.2–1.2)
Total Protein: 7.1 g/dL (ref 6.0–8.3)

## 2020-04-10 LAB — HEMOGLOBIN A1C: Hgb A1c MFr Bld: 6.1 % (ref 4.6–6.5)

## 2020-04-10 LAB — LIPID PANEL
Cholesterol: 142 mg/dL (ref 0–200)
HDL: 70.3 mg/dL (ref >39.00)
LDL Cholesterol: 60 mg/dL (ref 0–99)
NonHDL: 71.81
Total CHOL/HDL Ratio: 2
Triglycerides: 60 mg/dL (ref 0.0–149.0)
VLDL: 12 mg/dL (ref 0.0–40.0)

## 2020-04-10 NOTE — Assessment & Plan Note (Signed)
Follows with neurology and upcoming visit. Daughter in [place]. Discussed trying to bring daughter to next appt

## 2020-04-10 NOTE — Assessment & Plan Note (Signed)
Reviewed plan from recent ENT note. Discussed getting neti pot for improved treatment.

## 2020-04-10 NOTE — Patient Instructions (Addendum)
You are due for a Tetanus Shot - you can get this at a pharmacy    Sinus Congestion - Head Pressure -- Neti Pot (Saline rinse) -- 2 times day -- if tolerated -- this can be found at the pharmacy   Eye symptoms - call Dr. Posey Pronto with Ophthalmology    Please call the location of your choice from the menu below to schedule your Bone Density appointment.    [place]   [facility]. [facility]                      Phone:  [phone], [place] [address]                                                             Services: Bone Density    Return for check -up in 3 months -- or when daughter is next available

## 2020-04-10 NOTE — Progress Notes (Signed)
Annual Exam   Chief Complaint:  Chief Complaint  Patient presents with  . Medicare Wellness    History of Present Illness:  Ms. [name] is a 87 y.o. No obstetric history on file. who LMP was No LMP recorded. Patient has had a hysterectomy., presents today for her annual examination.    #Headache - for the last 2 months - sinus drainage persisting  - saw ENT - Dr. Lucia Gaskins - flonase twice daily - does not think this helped - getting hearing test as well - has not tried saline rinse as recommended  Also having issues with her eyelids  Nutrition She does get adequate calcium and Vitamin D in her diet. Diet: eating - three times a day Exercise: tries to exercise, going to PT   Safety The patient wears seatbelts: yes.     The patient feels safe at home and in their relationships: yes.   Menstrual: post menopausal  GYN She is not sexually active.    No age related cancer screening  Social History   Tobacco Use  Smoking Status Former Smoker  . Packs/day: 0.25  . Years: 10.00  . Pack years: 2.50  . Types: Cigarettes  . Quit date: 2  . Years since quitting: 42.0  Smokeless Tobacco Never Used    Lung Cancer Screening (Ages 65-80): no   Weight Wt Readings from Last 3 Encounters:  04/10/20 146 lb 8 oz (66.5 kg)  03/04/20 146 lb (66.2 kg)  12/18/19 138 lb (62.6 kg)   Patient has high BMI  BMI Readings from Last 1 Encounters:  04/10/20 27.68 kg/m     Chronic disease screening Blood pressure monitoring:  BP Readings from Last 3 Encounters:  04/10/20 128/70  04/08/20 (!) 131/98  03/04/20 (!) 160/90    Lipid Monitoring: Indication for screening: age >49, obesity, diabetes, family hx, CV risk factors.  Lipid screening: Yes  Lab Results  Component Value Date   CHOL 212 (H) 12/12/2018   HDL 78 12/12/2018   LDLCALC 120 (H) 12/12/2018   TRIG 72 12/12/2018   CHOLHDL 2.7 12/12/2018     Diabetes Screening: age >63, overweight, family hx,  PCOS, hx of gestational diabetes, at risk ethnicity Diabetes Screening screening: Yes  Lab Results  Component Value Date   HGBA1C 5.7 (H) 12/12/2018     Past Medical History:  Diagnosis Date  . Arrhythmia    chronic atrial fib  . Atrial fibrillation, chronic ([place])   . Chronic anticoagulation   . Chronic atrial fibrillation ([place]) 01/28/2017  . Chronic venous insufficiency 01/28/2017  . Clavicle fracture 11/20/2017  . Complication of anesthesia    " difficult waking "  . Long term current use of anticoagulant therapy 01/28/2017  . Osteoarthritis   . Presence of permanent cardiac pacemaker 01/28/2017   Original implant 1992 Lead Intermedics 431-04 Generator change 2001 and 2011.  Medtronic generator.   . SSS (sick sinus syndrome) ([place])   . Thyroid disease    hypothyroidism  . TIA (transient ischemic attack)     Past Surgical History:  Procedure Laterality Date  . ABDOMINAL HYSTERECTOMY    . INSERT / REPLACE / REMOVE PACEMAKER     generator change 2011 medtronic sigma  . KNEE SURGERY    . LEAD INSERTION N/A 03/05/2019   Procedure: LEAD INSERTION - RV LEAD;  Surgeon: Deboraha Sprang, MD;  Location: [place] [facility];  Service: Cardiovascular;  Laterality: N/A;  . PACEMAKER IMPLANT N/A 03/05/2019   Procedure:  PACEMAKER IMPLANT;  Surgeon: Deboraha Sprang, MD;  Location: [place] [facility];  Service: Cardiovascular;  Laterality: N/A;  . PACEMAKER INSERTION    . PARS PLANA VITRECTOMY Right 07/19/2018   Procedure: VITRECTOMY WITH VITREOUS BIPOSY & ENDOLASER;  Surgeon: Jalene Mullet, MD;  Location: [place] [facility];  Service: Ophthalmology;  Laterality: Right;  . PPM GENERATOR CHANGEOUT N/A 02/02/2017   Procedure: PPM GENERATOR CHANGEOUT;  Surgeon: Deboraha Sprang, MD;  Location: [place] [facility];  Service: Cardiovascular;  Laterality: N/A;  . REPLACEMENT TOTAL KNEE BILATERAL    . VARICOSE VEIN SURGERY      Prior to Admission medications   Medication Sig Start Date End Date Taking?  Authorizing Provider  acetaminophen (TYLENOL) 500 MG tablet Take 1,000 mg by mouth every 8 (eight) hours as needed for moderate pain or headache.    Yes [provider]  atorvastatin (LIPITOR) 20 MG tablet TAKE 1 TABLET BY MOUTH EVERY DAY IN THE EVENING 02/18/20  Yes [name], MD  Carboxymethylcell-Hypromellose (GENTEAL OP) Place 1 drop into both eyes daily as needed (dry eyes).   Yes [provider]  cetirizine (ZYRTEC) 5 MG tablet TAKE 1 TABLET BY MOUTH EVERY DAY 03/13/20  Yes [name], MD  Cholecalciferol (VITAMIN D) 50 MCG (2000 UT) tablet Take 1 tablet (2,000 Units total) by mouth daily. 06/25/19  Yes [name], MD  Cyanocobalamin (B-12) 1000 MCG CAPS TAKE 1 CAPSULE BY MOUTH EVERY DAY 03/17/20  Yes [name], MD  donepezil (ARICEPT) 10 MG tablet Take 1/2 tablet daily for 2 weeks, then increase to 1 tablet daily 10/11/19  Yes Cameron Sprang, MD  ELIQUIS 5 MG TABS tablet TAKE 1 TABLET BY MOUTH TWICE A DAY 02/13/20  Yes [name], MD  fluticasone (FLONASE) 50 MCG/ACT nasal spray Place 1 spray into both nostrils daily as needed for allergies or rhinitis. 12/18/19  Yes [name], MD  levothyroxine (SYNTHROID) 88 MCG tablet TAKE 1 TABLET BY MOUTH EVERY MORNING ON AN EMPTY STOMACH 11/06/19  Yes [name], MD  lisinopril (ZESTRIL) 20 MG tablet TAKE 1 TABLET BY MOUTH EVERY DAY 03/24/20  Yes [name], MD  oxybutynin (DITROPAN-XL) 5 MG 24 hr tablet TAKE 1 TABLET BY MOUTH EVERY DAY 08/28/19  Yes [name], MD  sertraline (ZOLOFT) 50 MG tablet TAKE 1 TABLET BY MOUTH EVERY DAY 11/12/19  Yes Cameron Sprang, MD    Allergies  Allergen Reactions  . Adhesive [Tape] Other (See Comments)    TAPE WILL TEAR THE SKIN!!!!  . Penicillins Hives and Rash    Did it involve swelling of the face/tongue/throat, SOB, or low BP? No Did it involve sudden or severe rash/hives, skin peeling, or any reaction on the inside of your mouth or nose? Yes Did you  need to seek medical attention at a hospital or doctor's office? Unknown  When did it last happen?unknown  If all above answers are "NO", may proceed with cephalosporin use.     Gynecologic History: No LMP recorded. Patient has had a hysterectomy.  Obstetric History: No obstetric history on file.  Social History   Socioeconomic History  . Marital status: Widowed    Spouse name: Not on file  . Number of children: 2  . Years of education: 2- year college  . Highest education level: Not on file  Occupational History  . Not on file  Tobacco Use  . Smoking status: Former Smoker  Packs/day: 0.25    Years: 10.00    Pack years: 2.50    Types: Cigarettes    Quit date: 1980    Years since quitting: 42.0  . Smokeless tobacco: Never Used  Vaping Use  . Vaping Use: Never used  Substance and Sexual Activity  . Alcohol use: No  . Drug use: No  . Sexual activity: Never  Other Topics Concern  . 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 Difficulty of Paying Living Expenses: Not hard at all  Food Insecurity: No Food Insecurity  . Worried About Charity fundraiser in the Last Year: Never true  . Ran Out of Food in the Last Year: Never true  Transportation Needs: No Transportation Needs  . Lack of Transportation (Medical): No  . Lack of  Transportation (Non-Medical): No  Physical Activity: Inactive  . Days of Exercise per Week: 0 days  . Minutes of Exercise per Session: 0 min  Stress: No Stress Concern Present  . Feeling of Stress : Not at all  Social Connections: Not on file  Intimate Partner Violence: Not At Risk  . Fear of Current or Ex-Partner: No  . Emotionally Abused: No  . Physically Abused: No  . Sexually Abused: No    Family History  Problem Relation Age of Onset  . Diabetes Mother   . Mental illness Mother   . Diabetes Father   . Heart attack Father     Review of Systems  Constitutional: Negative for chills and fever.  HENT: Negative for congestion and sore throat.   Eyes: Negative for blurred vision and double vision.  Respiratory: Negative for shortness of breath.   Cardiovascular: Negative for chest pain.  Gastrointestinal: Negative for heartburn, nausea and vomiting.  Genitourinary: Negative.   Musculoskeletal: Negative.  Negative for myalgias.  Skin: Negative for rash.  Neurological: Negative for dizziness and headaches.  Endo/Heme/Allergies: Does not bruise/bleed easily.  Psychiatric/Behavioral: Negative for depression. The patient is not nervous/anxious.      Physical Exam BP 128/70   Pulse 71   Temp (!) 97.2 F (36.2 C) (Temporal)   Ht 5\' 1"  (1.549 m)   Wt 146 lb 8 oz (66.5 kg)   SpO2 100%   BMI 27.68 kg/m    BP Readings from Last 3 Encounters:  04/10/20 128/70  04/08/20 (!) 131/98  03/04/20 (!) 160/90      Physical Exam Constitutional:      General: She is not in acute distress.    Appearance: She is well-developed and well-nourished. She is not diaphoretic.  HENT:     Head: Normocephalic and atraumatic.     Right Ear: External  ear normal.     Left Ear: External ear normal.     Nose: Nose normal.     Mouth/Throat:     Mouth: Oropharynx is clear and moist.  Eyes:     General: No scleral icterus.    Extraocular Movements: EOM normal.     Conjunctiva/sclera:  Conjunctivae normal.  Cardiovascular:     Rate and Rhythm: Normal rate and regular rhythm.     Heart sounds: No murmur heard.   Pulmonary:     Effort: Pulmonary effort is normal. No respiratory distress.     Breath sounds: Normal breath sounds. No wheezing.  Abdominal:     General: Bowel sounds are normal. There is no distension.     Palpations: Abdomen is soft. There is no mass.     Tenderness: There is no abdominal tenderness. There is no guarding or rebound.  Musculoskeletal:        General: No edema. Normal range of motion.     Cervical back: Neck supple.  Lymphadenopathy:     Cervical: No cervical adenopathy.  Skin:    General: Skin is warm and dry.     Capillary Refill: Capillary refill takes less than 2 seconds.  Neurological:     Mental Status: She is alert and oriented to person, place, and time.     Deep Tendon Reflexes: Strength normal. Reflexes normal.  Psychiatric:        Mood and Affect: Mood and affect normal.        Behavior: Behavior normal.     Results:  PHQ-9:  Flowsheet Row Clinical Support from 04/08/2020 in [place] at [facility]  PHQ-9 Total Score 0        Assessment: 87 y.o. No obstetric history on file. female here for routine annual physical examination.  Plan: Problem List Items Addressed This Visit      Cardiovascular and Mediastinum   Chronic a-fib ([place]) (Chronic)   Relevant Orders   Comprehensive metabolic panel     Respiratory   Sinus congestion    Reviewed plan from recent ENT note. Discussed getting neti pot for improved treatment.         Nervous and Auditory   Dementia without behavioral disturbance ([place])    Follows with neurology and upcoming visit. Daughter in [place]. 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 Advised reaching out to ophthalmology for continued treatment.       Prediabetes   Relevant Orders   Hemoglobin A1c    Other Visit Diagnoses    Annual physical exam    -  Primary   Estrogen deficiency       Relevant Orders   DG Bone Density   Mixed hyperlipidemia       Relevant Orders   Lipid panel      Screening: -- Blood pressure screen normal -- cholesterol screening: will obtain -- Weight screening: overweight: continue to monitor -- Diabetes Screening: will obtain -- Nutrition: Encouraged healthy diet  The ASCVD Risk score Mikey Bussing DC Jr., et al., 2013) failed to calculate for the following reasons:   The 2013 ASCVD risk score is only valid for ages 70 to 28  -- Statin therapy for Age  40-75 with CVD risk >7.5%  Psych -- Depression screening (PHQ-9):  Flowsheet Row Clinical Support from 04/08/2020 in [place] at [facility]  PHQ-9 Total Score 0       Safety -- tobacco screening: not using -- alcohol screening:  low-risk usage. -- no evidence of domestic violence or intimate partner violence.   Cancer Screening -- pap smear not collected per ASCCP guidelines -- family history of breast cancer screening: done. not at high risk. -- Mammogram - not indicated -- Colon cancer (age 44+)-- not indicated  Immunizations Immunization History  Administered Date(s) Administered  . Influenza, High Dose Seasonal PF 11/16/2018  . Influenza-Unspecified 11/20/2013  . PFIZER(Purple Top)SARS-COV-2 Vaccination 05/13/2019, 06/06/2019, 12/25/2019  . Pneumococcal Conjugate-13 03/14/2019  . Pneumococcal Polysaccharide-23 12/08/2012     -- flu vaccine up to date -- TDAP q10 years encouraged getting at pharmacy -- Shingles (age >50) up to date -- PPSV-23 (19-64 with chronic disease or smoking) up to date -- PCV-13 (age >45) - one dose followed by PPSV-23 1 year later up to date -- Covid-19 Vaccine up to date   Encouraged healthy  diet and exercise. Encouraged regular vision and dental care.    [name], MD

## 2020-04-10 NOTE — Assessment & Plan Note (Signed)
History difficult as at last visit she mentioned she has previously discussed with ophthalmology though today says she has not and then that she has an appointment next month. Advised reaching out to ophthalmology for continued treatment.

## 2020-04-11 ENCOUNTER — Telehealth: Payer: Self-pay

## 2020-04-11 NOTE — Telephone Encounter (Signed)
Updated chart. I had already been in chart.

## 2020-04-11 NOTE — Telephone Encounter (Signed)
[place] Night - Client Nonclinical Telephone Record  [facility] Client [place] Night - Client Client Site [place] Physician Waunita Schooner- MD Contact Type Call Who Is Calling Patient / Member / Family / Caregiver Caller Name [name] Caller Phone Number [phone] Patient Name [name] Patient DOB [birth year] Call Type Message Only Information Provided Reason for Call Request for General Office Information Initial Comment Caller states she was seen in the office today. In 1956 she had PNA bad and she broke out from taking penicillin. She is wanting to update her chart. Additional Comment Provided caller with office hours from profile. Caller declined RN triage. Disp. Time Disposition Final User 04/10/2020 5:12:34 PM General Information Provided Yes Harrington, Lauren Call Closed By: Philis Pique Transaction Date/Time: 04/10/2020 5:07:42 PM (ET)

## 2020-04-14 ENCOUNTER — Other Ambulatory Visit: Payer: Self-pay

## 2020-04-14 ENCOUNTER — Ambulatory Visit (INDEPENDENT_AMBULATORY_CARE_PROVIDER_SITE_OTHER): Payer: Medicare Other | Admitting: Otolaryngology

## 2020-04-14 VITALS — Temp 97.5°F

## 2020-04-14 DIAGNOSIS — H906 Mixed conductive and sensorineural hearing loss, bilateral: Secondary | ICD-10-CM | POA: Diagnosis not present

## 2020-04-14 DIAGNOSIS — H903 Sensorineural hearing loss, bilateral: Secondary | ICD-10-CM

## 2020-04-14 DIAGNOSIS — H9312 Tinnitus, left ear: Secondary | ICD-10-CM | POA: Diagnosis not present

## 2020-04-14 NOTE — Progress Notes (Signed)
HPI: [name] is a 87 y.o. female who returns today for evaluation of ringing in her left ear.  She apparently had sudden left ear hearing loss several months ago as she has been describing chronic ringing in the left ear.  She apparently has had several falls hitting her head and has had CT scans which showed no obvious intracranial abnormality or fractures.  Ears have been clear on examination.  She presents today for audiologic testing.  Past Medical History:  Diagnosis Date  . Arrhythmia    chronic atrial fib  . Atrial fibrillation, chronic ([place])   . Chronic anticoagulation   . Chronic atrial fibrillation ([place]) 01/28/2017  . Chronic venous insufficiency 01/28/2017  . Clavicle fracture 11/20/2017  . Complication of anesthesia    " difficult waking "  . Long term current use of anticoagulant therapy 01/28/2017  . Osteoarthritis   . Presence of permanent cardiac pacemaker 01/28/2017   Original implant 1992 Lead Intermedics 431-04 Generator change 2001 and 2011.  Medtronic generator.   . SSS (sick sinus syndrome) ([place])   . Thyroid disease    hypothyroidism  . TIA (transient ischemic attack)    Past Surgical History:  Procedure Laterality Date  . ABDOMINAL HYSTERECTOMY    . INSERT / REPLACE / REMOVE PACEMAKER     generator change 2011 medtronic sigma  . KNEE SURGERY    . LEAD INSERTION N/A 03/05/2019   Procedure: LEAD INSERTION - RV LEAD;  Surgeon: Deboraha Sprang, MD;  Location: [place] [facility];  Service: Cardiovascular;  Laterality: N/A;  . PACEMAKER IMPLANT N/A 03/05/2019   Procedure: PACEMAKER IMPLANT;  Surgeon: Deboraha Sprang, MD;  Location: [place] [facility];  Service: Cardiovascular;  Laterality: N/A;  . PACEMAKER INSERTION    . PARS PLANA VITRECTOMY Right 07/19/2018   Procedure: VITRECTOMY WITH VITREOUS BIPOSY & ENDOLASER;  Surgeon: Jalene Mullet, MD;  Location: [place];  Service: Ophthalmology;  Laterality: Right;  . PPM GENERATOR CHANGEOUT N/A 02/02/2017    Procedure: PPM GENERATOR CHANGEOUT;  Surgeon: Deboraha Sprang, MD;  Location: [place] [facility];  Service: Cardiovascular;  Laterality: N/A;  . REPLACEMENT TOTAL KNEE BILATERAL    . VARICOSE VEIN SURGERY     Social History   Socioeconomic History  . Marital status: Widowed    Spouse name: Not on file  . Number of children: 2  . Years of education: 2- year college  . Highest education level: Not on file  Occupational History  . Not on file  Tobacco Use  . Smoking status: Former Smoker    Packs/day: 0.25    Years: 10.00    Pack years: 2.50    Types: Cigarettes    Quit date: 1980    Years since quitting: 42.0  . Smokeless tobacco: Never Used  Vaping Use  . Vaping Use: Never used  Substance and Sexual Activity  . Alcohol use: No  . Drug use: No  . Sexual activity: Never  Other Topics Concern  . 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 Difficulty of Paying Living Expenses: Not hard at all  Food Insecurity: No Food Insecurity  . Worried About Charity fundraiser in the Last Year: Never true  . Ran Out of Food in the Last Year: Never true  Transportation Needs: No Transportation Needs  . Lack of Transportation (Medical): No  . Lack of Transportation (Non-Medical): No  Physical Activity: Inactive  . Days of Exercise per Week: 0 days  . Minutes  of Exercise per Session: 0 min  Stress: No Stress Concern Present  . Feeling of Stress : Not at all  Social Connections: Not on file   Family History  Problem Relation Age of Onset  . Diabetes Mother   . Mental illness Mother   . Diabetes Father   . Heart attack Father    Allergies  Allergen Reactions  . Adhesive [Tape] Other (See Comments)    TAPE WILL TEAR THE SKIN!!!!  . Penicillins Hives and Rash    Did it involve swelling of the face/tongue/throat, SOB, or low BP? No Did it involve sudden or severe rash/hives, skin peeling, or any reaction on the inside of your mouth or nose? Yes Did you need to seek medical attention at a hospital or doctor's office? Unknown  When did it last happen?unknown  If all above answers are "NO", may proceed with cephalosporin use.    Prior to Admission medications   Medication Sig Start Date End Date Taking? Authorizing Provider  acetaminophen (TYLENOL) 500 MG tablet Take 1,000 mg by mouth every 8 (eight) hours as needed for moderate pain or headache.     [provider]  atorvastatin (LIPITOR) 20 MG tablet TAKE 1 TABLET BY MOUTH EVERY DAY IN THE EVENING 02/18/20   [name], MD  Carboxymethylcell-Hypromellose (GENTEAL OP) Place 1 drop into both eyes daily as needed (dry eyes).    [provider]  cetirizine (ZYRTEC) 5 MG tablet TAKE 1 TABLET BY MOUTH EVERY DAY 03/13/20   [name], MD  Cholecalciferol (VITAMIN D) 50 MCG (2000 UT) tablet Take 1 tablet (2,000 Units total) by mouth daily. 06/25/19   [name], MD  Cyanocobalamin (B-12) 1000 MCG CAPS TAKE 1 CAPSULE BY MOUTH EVERY DAY 03/17/20   [name], MD  donepezil (ARICEPT) 10 MG tablet Take 1/2 tablet daily for 2 weeks, then increase to 1 tablet daily 10/11/19   Cameron Sprang, MD  ELIQUIS 5 MG TABS tablet TAKE 1 TABLET BY MOUTH TWICE A DAY 02/13/20   [name], MD  fluticasone (FLONASE) 50 MCG/ACT nasal spray Place 1 spray into both nostrils  daily as needed for allergies or rhinitis. 12/18/19   [name], MD  levothyroxine (SYNTHROID) 88 MCG tablet TAKE 1 TABLET BY MOUTH EVERY MORNING ON AN EMPTY STOMACH 11/06/19   [name], MD  lisinopril (ZESTRIL) 20 MG tablet TAKE 1 TABLET BY MOUTH EVERY DAY 03/24/20   [name], MD  oxybutynin (DITROPAN-XL) 5 MG 24 hr tablet TAKE 1 TABLET BY MOUTH EVERY DAY 08/28/19   [name], MD  sertraline (  ZOLOFT) 50 MG tablet TAKE 1 TABLET BY MOUTH EVERY DAY 11/12/19   Cameron Sprang, MD     Positive ROS: Otherwise negative  All other systems have been reviewed and were otherwise negative with the exception of those mentioned in the HPI and as above.  Physical Exam: Constitutional: Alert, well-appearing, no acute distress Ears: External ears without lesions or tenderness. Ear canals are clear bilaterally with intact, clear TMs bilaterally. Nasal: External nose without lesions.. Clear nasal passages Oral: Lips and gums without lesions. Tongue and palate mucosa without lesions. Posterior oropharynx clear. Neck: No palpable adenopathy or masses Respiratory: Breathing comfortably  Skin: No facial/neck lesions or rash noted.  Review of the audiogram this demonstrated mild left ear sensorineural hearing loss and moderate to severe left ear mixed hearing loss.  SRT's were 35 DB on the right side and 65 DB on the left side.  She had type A tympanograms bilaterally.  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  Plan: Would recommend proceeding with hearing aid use and discussed this with her as well as the caregiver who presents with her today.   Radene Journey, MD

## 2020-04-16 ENCOUNTER — Telehealth: Payer: Self-pay

## 2020-04-16 NOTE — Chronic Care Management (AMB) (Addendum)
Chronic Care Management Pharmacy Assistant   Name: [name]  MRN: [id number] DOB: 02/16/1931  Reason for Encounter: Disease State- Hypertension  Patient Questions:  1.  Have you seen any other providers since your last visit? yes 03/04/20- Dr. Gweneth Dimitri 03/04/2020 PCP 03/27/20- Dr. Dillard Cannon- ENT 04/10/20- Dr. Gweneth Dimitri- PCP 04/14/20- Dr. Dillard Cannon- ENT   2.  Any changes in your medicines or health? No  PCP : Lynnda Child, MD  Allergies:   Allergies  Allergen Reactions   Adhesive [Tape] Other (See Comments)    TAPE WILL TEAR THE SKIN!!!!   Penicillins Hives and Rash    Did it involve swelling of the face/tongue/throat, SOB, or low BP? No Did it involve sudden or severe rash/hives, skin peeling, or any reaction on the inside of your mouth or nose? Yes Did you need to seek medical attention at a hospital or doctor's office? Unknown  When did it last happen?      unknown  If all above answers are "NO", may proceed with cephalosporin use.     Medications: Outpatient Encounter Medications as of 04/16/2020  Medication Sig Note   acetaminophen (TYLENOL) 500 MG tablet Take 1,000 mg by mouth every 8 (eight) hours as needed for moderate pain or headache.     atorvastatin (LIPITOR) 20 MG tablet TAKE 1 TABLET BY MOUTH EVERY DAY IN THE EVENING    Carboxymethylcell-Hypromellose (GENTEAL OP) Place 1 drop into both eyes daily as needed (dry eyes).    cetirizine (ZYRTEC) 5 MG tablet TAKE 1 TABLET BY MOUTH EVERY DAY    Cholecalciferol (VITAMIN D) 50 MCG (2000 UT) tablet Take 1 tablet (2,000 Units total) by mouth daily.    Cyanocobalamin (B-12) 1000 MCG CAPS TAKE 1 CAPSULE BY MOUTH EVERY DAY    donepezil (ARICEPT) 10 MG tablet Take 1/2 tablet daily for 2 weeks, then increase to 1 tablet daily    ELIQUIS 5 MG TABS tablet TAKE 1 TABLET BY MOUTH TWICE A DAY    fluticasone (FLONASE) 50 MCG/ACT nasal spray Place 1 spray into both nostrils daily as needed for  allergies or rhinitis.    levothyroxine (SYNTHROID) 88 MCG tablet TAKE 1 TABLET BY MOUTH EVERY MORNING ON AN EMPTY STOMACH    lisinopril (ZESTRIL) 20 MG tablet TAKE 1 TABLET BY MOUTH EVERY DAY    oxybutynin (DITROPAN-XL) 5 MG 24 hr tablet TAKE 1 TABLET BY MOUTH EVERY DAY    sertraline (ZOLOFT) 50 MG tablet TAKE 1 TABLET BY MOUTH EVERY DAY 01/28/2020: Bedtime    No facility-administered encounter medications on file as of 04/16/2020.    Current Diagnosis: Patient Active Problem List   Diagnosis Date Noted   Sinus congestion 12/18/2019   Dementia without behavioral disturbance (HCC) 07/31/2019   Sick sinus syndrome (HCC) 07/28/2019   Epigastric pain 06/12/2019   Localized swelling of left lower leg 04/11/2019   Memory loss 04/11/2019   Prediabetes 03/14/2019   Complete heart block (HCC) 03/05/2019   TIA (transient ischemic attack) 01/15/2019   Gait abnormality 01/15/2019   Transient ischemia 12/13/2018   Dysarthria 12/11/2018   Hypothyroidism 12/11/2018   Arthritis of right hand 08/17/2018   Chronic back pain 04/11/2018   Encounter for therapeutic drug monitoring 03/09/2018   Malignant hypertension 01/04/2018   Clavicle fracture 11/20/2017   Closed fracture of clavicle 11/20/2017   Fall    Accelerated hypertension    Carpal tunnel syndrome of left wrist 08/02/2017   Carpal tunnel syndrome of  right wrist 08/02/2017   Bradycardia 02/02/2017   Presence of permanent cardiac pacemaker 01/28/2017   Chronic a-fib ([place]) 01/28/2017   Long term current use of anticoagulant therapy 01/28/2017   Chronic venous insufficiency 01/28/2017   Chronic atrial fibrillation ([place]) 01/28/2017   Peripheral venous insufficiency 01/28/2017   Cardiac pacemaker in situ 01/28/2017   Anticoagulated 01/28/2017   DOE (dyspnea on exertion) 09/22/2015   Dyspnea on exertion 09/22/2015   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 05/15/2014   After cataract of left eye not obscuring vision 05/15/2014   Bilateral  dry eyes 05/15/2014   Dermatochalasis of eyelid 05/15/2014   Hyperopia of both eyes with astigmatism and presbyopia 05/15/2014   Irregular astigmatism of right eye 05/15/2014   Mechanical complication due to intraocular lens implant 05/15/2014   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 Spoke with patient regarding BP  Recent Office Vitals: BP Readings from Last 3 Encounters:  04/10/20 128/70  04/08/20 (!) 131/98  03/04/20 (!) 160/90   Pulse Readings from Last 3 Encounters:  04/10/20 71  04/08/20 63  03/04/20 62    Wt Readings from Last 3 Encounters:  04/10/20 146 lb 8 oz (66.5 kg)  03/04/20 146 lb (66.2 kg)  12/18/19 138 lb (62.6 kg)     Kidney Function Lab Results  Component Value Date/Time   CREATININE 1.27 (H) 04/10/2020 10:11 AM   CREATININE 0.99 07/31/2019 10:16 AM   GFR 38.04 (L) 04/10/2020 10:11 AM   GFRNONAA 42 (L) 02/13/2019 12:00 AM   GFRAA 49 (L) 02/13/2019 12:00 AM    BMP Latest Ref Rng & Units 04/10/2020 07/31/2019 05/28/2019  Glucose 70 - 99 mg/dL 107(H) 97 105(H)  BUN 6 - 23 mg/dL 21 19 14   Creatinine 0.40 - 1.20 mg/dL 1.27(H) 0.99 0.99  BUN/Creat Ratio 12 - 28 - - -  Sodium 135 - 145 mEq/L 136 138 137  Potassium 3.5 - 5.1 mEq/L 4.0 4.5 4.6  Chloride 96 - 112 mEq/L 100 103 102  CO2 19 - 32 mEq/L 31 29 31   Calcium 8.4 - 10.5 mg/dL 9.6 9.5 9.6    Current antihypertensive regimen:  Lisinopril 20 mg - 1 tablet daily  How often are you checking your Blood Pressure? weekly   Current home BP readings:  157/86, 65 142/81, 76  What recent interventions/DTPs have been made by any provider to improve Blood Pressure control since last CPP Visit:  Recommend home blood pressure monitoring   Any recent hospitalizations or ED visits since last visit with CPP? No   What diet changes have been made to improve Blood Pressure Control?  No changes  What exercise is being done to improve  your Blood Pressure Control?  States patient is active around the house but has to be careful due to falls.  Adherence Review: Is the patient currently on ACE/ARB medication? Yes Does the patient have >5 day gap between last estimated fill dates? CPP to review  States that she has a lot of congestion in her head. She has been using a nettie pot.   Follow-Up:  Pharmacist Review   Debbora Dus, CPP notified  Margaretmary Dys, [place] Assistant [phone]  Due to severe orthostatic hypotension, BP goal < 150/90 mmHg. Will continue to monitor home BP for next 2 weeks to get more readings. Then consider add on therapy if BP consistently above 150/90.  Debbora Dus, PharmD Clinical Pharmacist [facility]

## 2020-04-17 ENCOUNTER — Other Ambulatory Visit: Payer: Self-pay | Admitting: Family Medicine

## 2020-04-17 DIAGNOSIS — R0981 Nasal congestion: Secondary | ICD-10-CM

## 2020-04-18 ENCOUNTER — Encounter: Payer: Self-pay | Admitting: Neurology

## 2020-04-18 ENCOUNTER — Other Ambulatory Visit: Payer: Self-pay

## 2020-04-18 ENCOUNTER — Ambulatory Visit (INDEPENDENT_AMBULATORY_CARE_PROVIDER_SITE_OTHER): Payer: Medicare Other | Admitting: Neurology

## 2020-04-18 VITALS — BP 162/90 | HR 98 | Resp 18 | Ht 62.0 in | Wt 150.0 lb

## 2020-04-18 DIAGNOSIS — F0151 Vascular dementia with behavioral disturbance: Secondary | ICD-10-CM

## 2020-04-18 DIAGNOSIS — F01518 Vascular dementia, unspecified severity, with other behavioral disturbance: Secondary | ICD-10-CM

## 2020-04-18 MED ORDER — DONEPEZIL HCL 10 MG PO TABS: ORAL_TABLET | ORAL | 3 refills | Status: AC

## 2020-04-18 MED ORDER — SERTRALINE HCL 100 MG PO TABS: 100.0000 mg | ORAL_TABLET | Freq: Every day | ORAL | 3 refills | Status: DC

## 2020-04-18 NOTE — Progress Notes (Signed)
NEUROLOGY FOLLOW UP OFFICE NOTE  [name] [id number] 06-May-1930  HISTORY OF PRESENT ILLNESS: I had the pleasure of seeing [name] in follow-up in the neurology clinic on 04/18/2020.  The patient was last seen 6 [name] ago for mixed dementia. She is again accompanied by her daughter [name] who helps supplement the history today.  Records and images were personally reviewed where available.  On her last visit, Donepezil 10mg  daily was started. Sertraline was also increased to 50mg  daily due to increased agitation. Her daughter provides a list of concerns prior to the visit. She is more agitated and asks if Zoloft can be increased. She is constantly bickering with her caregivers and complains about doctor appointments. She continues to dwell in the past and thinks she can still do things that she shouldn't try. She states her memory is terrible. Caregivers administer medications, [name]. She does not drive. She has a caregiver most of the day, with only a couple of hours in the afternoon where she is by herself. She fell in the yard while gardening last Fall and since then has had increased supervision. She fell out of a bar stool a couple of days ago, she likes to eat at the bar because food tends to spill around her on the dining table. She reports continued congestion, it feels like her head is so full of water, and some nights this affects her sleep. She feels her eyelids are drooping. She reports hearing loss on the left ear, awaiting hearing aid. She has had physical therapy for left frozen shoulder, she could not get up by herself when she falls.    History on Initial Assessment 06/19/2019: This is an 87 year old right-handed woman with a history of hypertension, hyperlipidemia, hypothyroidism, atrial fibrillation s/p PPM on anticoagulation with Eliquis, presenting for evaluation of memory loss. She is accompanied by her aide [name] who has only been with her for 2  [name]. I attempted to contact her daughter for more information, unable to reach her. She states she knows her memory is not good. She gets ready to call a friend and cannot think of their name. She had been living alone until Christmas 2019 after she had several falls. She stayed at [facility] then moved to a different home with almost 24/7 care ([facility]). She is alone from 4 to 8pm. Her daughter/POA lives in [place]. She has a son in [place]. Her daughter manages her checkbook, which aggravates her because she does not not what is going on. She reports she was good with bill payments. She stopped driving 2 years ago, stating her daughter just decided she did not want the patient to drive. She manages her own medications, she fixes 4 boxes every week, aides watch her and report she does well with this. She denies misplacing things and does her own cooking at night. She denies leaving the stove on. [name] reports that she is mostly just forgetful, good with her medications. She is independent with dressing and bathing. No paranoia or hallucinations. She reports a fall when she pulled on a limb, then says "my daughter said it's not true."   PCP notes from 04/11/19 reviewed: "Pt does not seem to have overt symptoms and does have care givers to help her at home. Family dynamic seems challenging - daughter out of state who has one opinion of patient's capability vs care takers who feel she is fairly active and does a good job managing her medications.  Discussed neurology referral for further evaluation of noted memory changes, but also encouraged her contact social services if she feels she should have more control - that if work-up shows good mental ability she could regain control of her own finances."  She denies any headaches, neck/back pain, focal tingling/weakness, bladder dysfunction, anosmia, tremors. She feels her eyes are blurred when her eyelids are heavy, sometimes making her feel dizzy. She  denies any falls in the past 4 [name]. Sleep is okay. She has swelling in both legs. Her left leg has felt weaker for the past 2-3 [name]. She has tingling in her fingers.  Diagnostic Data: Head CT without contrast in 06/2019 showed moderate cerebral atrophy, chronic microvascular disease.  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 She had anosognosia with lack of insight into her condition, and felt incapable of managing high-level affairs. She was advised not to drive.    PAST MEDICAL HISTORY: Past Medical History:  Diagnosis Date  . Arrhythmia    chronic atrial fib  . Atrial fibrillation, chronic ([place])   . Chronic anticoagulation   . Chronic atrial fibrillation ([place]) 01/28/2017  . Chronic venous insufficiency 01/28/2017  . Clavicle fracture 11/20/2017  . Complication of anesthesia    " difficult waking "  . Long term current use of anticoagulant therapy 01/28/2017  . Osteoarthritis   . Presence of permanent cardiac pacemaker 01/28/2017   Original implant 1992 [employer] 431-04 Generator change 2001 and 2011.  Medtronic generator.   . SSS (sick sinus syndrome) ([place])   . Thyroid disease    hypothyroidism  . TIA (transient ischemic attack)     MEDICATIONS: Current Outpatient Medications on File Prior to Visit  Medication Sig Dispense Refill  . acetaminophen (TYLENOL) 500 MG tablet Take 1,000 mg by mouth every 8 (eight) hours as needed for moderate pain or headache.     Marland Kitchen atorvastatin (LIPITOR) 20 MG tablet TAKE 1 TABLET BY MOUTH EVERY DAY IN THE EVENING 90 tablet 1  . Carboxymethylcell-Hypromellose (GENTEAL OP) Place 1 drop into both eyes daily as needed (dry eyes).    . cetirizine (ZYRTEC) 5 MG tablet TAKE 1 TABLET BY MOUTH EVERY DAY 30 tablet 2  . Cholecalciferol (VITAMIN  D) 50 MCG (2000 UT) tablet Take 1 tablet (2,000 Units total) by mouth daily. 90 tablet 3  . Cyanocobalamin (B-12) 1000 MCG CAPS TAKE 1 CAPSULE BY MOUTH EVERY DAY 30 capsule 11  . donepezil (ARICEPT) 10 MG tablet Take 1/2 tablet daily for 2 weeks, then increase to 1 tablet daily 30 tablet 11  . ELIQUIS 5 MG TABS tablet TAKE 1 TABLET BY MOUTH TWICE A DAY 60 tablet 2  . fluticasone (FLONASE) 50 MCG/ACT nasal spray Place 1 spray into both nostrils daily as needed for allergies or rhinitis. 9.9 mL 2  . levothyroxine (SYNTHROID) 88 MCG tablet TAKE 1 TABLET BY MOUTH EVERY MORNING ON AN EMPTY STOMACH 90 tablet 3  . lisinopril (ZESTRIL) 20 MG tablet TAKE 1 TABLET BY MOUTH EVERY DAY 90 tablet 1  . oxybutynin (DITROPAN-XL) 5 MG 24 hr tablet TAKE 1 TABLET BY MOUTH EVERY DAY 90 tablet 2  . sertraline (ZOLOFT) 50 MG tablet TAKE 1 TABLET BY MOUTH EVERY DAY 90 tablet 4   No current facility-administered medications on file prior to visit.    ALLERGIES: Allergies  Allergen Reactions  .  Adhesive [Tape] Other (See Comments)    TAPE WILL TEAR THE SKIN!!!!  . Penicillins Hives and Rash    Did it involve swelling of the face/tongue/throat, SOB, or low BP? No Did it involve sudden or severe rash/hives, skin peeling, or any reaction on the inside of your mouth or nose? Yes Did you need to seek medical attention at a hospital or doctor's office? Unknown  When did it last happen?unknown  If all above answers are "NO", may proceed with cephalosporin use.     FAMILY HISTORY: Family History  Problem Relation Age of Onset  . Diabetes Mother   . Mental illness Mother   . Diabetes Father   . Heart attack Father     SOCIAL HISTORY: Social History   Socioeconomic History  . Marital status: Widowed    Spouse name: Not on file  . Number of children: 2  . Years of education: 2- year college  . Highest education level: Not on file  Occupational History  . Not on file  Tobacco Use  . Smoking status:  Former Smoker    Packs/day: 0.25    Years: 10.00    Pack years: 2.50    Types: Cigarettes    Quit date: 1980    Years since quitting: 42.1  . Smokeless tobacco: Never Used  Vaping Use  . Vaping Use: Never used  Substance and Sexual Activity  . Alcohol use: No  . Drug use: No  . Sexual activity: Never  Other Topics Concern  . Not on file  Social History Narrative   03/14/19   From: the area   Living: alone, but caretaker that - 20 hours a day of care givers, is alone from 4-8 pm   Work: retired -    Widowed: husband died from [employer], used to run a golf course      Family: 2 children - [name] ([place]), Marcello Moores (lives nearby), has grandchildren and Designer, industrial/product      Enjoys: play golf (not as much), painting, crafting    Right handed      Right handed   Exercise: yard work   Diet: anything, whatever she can find      Safety   Seat belts: Yes    Guns: No   Safe in relationships: Yes    Social Determinants of Radio broadcast assistant Strain: Low Risk   . Difficulty of Paying Living Expenses: Not hard at all  Food Insecurity: No Food Insecurity  . Worried About Charity fundraiser in the Last Year: Never true  . Ran Out of Food in the Last Year: Never true  Transportation Needs: No Transportation Needs  . Lack of Transportation (Medical): No  . Lack of Transportation (Non-Medical): No  Physical Activity: Inactive  . Days of Exercise per Week: 0 days  . Minutes of Exercise per Session: 0 min  Stress: No Stress Concern Present  . Feeling of Stress : Not at all  Social Connections: Not on file  Intimate Partner Violence: Not At Risk  . Fear of Current or Ex-Partner: No  . Emotionally Abused: No  . Physically Abused: No  . Sexually Abused: No     PHYSICAL EXAM: Vitals:   04/18/20 1148  BP: (!) 162/90  Pulse: 98  Resp: 18  SpO2: 99%   General: No acute distress Head:  Normocephalic/atraumatic Skin/Extremities: No rash, no  edema Neurological Exam: alert and oriented to person, place, and time. No aphasia or dysarthria.  Fund of knowledge is appropriate.  Recent and remote memory are impaired, 2/3 delayed recall. Attention and concentration are normal, 5/5 WORLD backwards, 3/5 serial 7s. Cranial nerves: Pupils equal, round. Extraocular movements intact with no nystagmus. Visual fields full.  No facial asymmetry.  Motor: Bulk and tone normal, muscle strength 5/5 throughout with no pronator drift.   Finger to nose testing intact.  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 Head CT no acute changes, there is moderate diffuse atrophy and chronic microvascular disease. She is tolerating Donepezil 10mg  daily without side effects. Her daughter is reporting more agitation and irritability, increase Sertraline to 100mg  daily. Continue close supervision. Continue control of vascular risk factors, physical exercise, and brain stimulation exercises for brain health. She does not drive. Follow-up in 6-8 [name], they know to call for any changes.   Thank you for allowing me to participate in her care.  Please do not hesitate to call for any questions or concerns.   Ellouise Newer, M.D.   CC: Dr. Einar Pheasant

## 2020-04-18 NOTE — Patient Instructions (Signed)
Good to see you!  1. Increase Sertraline (Zoloft) to 100mg  every night  2. Continue Donepezil (Aricept) 10mg  daily  3. Continue close supervision  4. Follow-up in 6-8 months, call for any changes   FALL PRECAUTIONS: Be cautious when walking. Scan the area for obstacles that may increase the risk of trips and falls. When getting up in the mornings, sit up at the edge of the bed for a few minutes before getting out of bed. Consider elevating the bed at the head end to avoid drop of blood pressure when getting up. Walk always in a well-lit room (use night lights in the walls). Avoid area rugs or power cords from appliances in the middle of the walkways. Use a walker or a cane if necessary and consider physical therapy for balance exercise. Get your eyesight checked regularly.  FINANCIAL OVERSIGHT: Supervision, especially oversight when making financial decisions or transactions is also recommended.  HOME SAFETY: Consider the safety of the kitchen when operating appliances like stoves, microwave oven, and blender. Consider having supervision and share cooking responsibilities until no longer able to participate in those. Accidents with firearms and other hazards in the house should be identified and addressed as well.  ABILITY TO BE LEFT ALONE: If patient is unable to contact 911 operator, consider using LifeLine, or when the need is there, arrange for someone to stay with patients. Smoking is a fire hazard, consider supervision or cessation. Risk of wandering should be assessed by caregiver and if detected at any point, supervision and safe proof recommendations should be instituted.   RECOMMENDATIONS FOR ALL PATIENTS WITH MEMORY PROBLEMS: 1. Continue to exercise (Recommend 30 minutes of walking everyday, or 3 hours every week) 2. Increase social interactions - continue going to [employer] and enjoy social gatherings with friends and family 3. Eat healthy, avoid fried foods and eat more fruits and  vegetables 4. Maintain adequate blood pressure, blood sugar, and blood cholesterol level. Reducing the risk of stroke and cardiovascular disease also helps promoting better memory. 5. Avoid stressful situations. Live a simple life and avoid aggravations. Organize your time and prepare for the next day in anticipation. 6. Sleep well, avoid any interruptions of sleep and avoid any distractions in the bedroom that may interfere with adequate sleep quality 7. Avoid sugar, avoid sweets as there is a strong link between excessive sugar intake, diabetes, and cognitive impairment The Mediterranean diet has been shown to help patients reduce the risk of progressive memory disorders and reduces cardiovascular risk. This includes eating fish, eat fruits and green leafy vegetables, nuts like almonds and hazelnuts, walnuts, and also use olive oil. Avoid fast foods and fried foods as much as possible. Avoid sweets and sugar as sugar use has been linked to worsening of memory function.  There is always a concern of gradual progression of memory problems. If this is the case, then we may need to adjust level of care according to patient needs. Support, both to the patient and caregiver, should then be put into place.

## 2020-04-21 ENCOUNTER — Telehealth: Payer: Self-pay

## 2020-04-21 NOTE — Chronic Care Management (AMB) (Signed)
Chronic Care Management Pharmacy Assistant   Name: [name]  MRN: [id number] DOB: [birth year]  Reason for Encounter: Disease State- Hypertension- Schedule follow up call   PCP : [name], MD  Allergies:   Allergies  Allergen Reactions  . Adhesive [Tape] Other (See Comments)    TAPE WILL TEAR THE SKIN!!!!  . Penicillins Hives and Rash    Did it involve swelling of the face/tongue/throat, SOB, or low BP? No Did it involve sudden or severe rash/hives, skin peeling, or any reaction on the inside of your mouth or nose? Yes Did you need to seek medical attention at a hospital or doctor's office? Unknown  When did it last happen?unknown  If all above answers are "NO", may proceed with cephalosporin use.     Medications: Outpatient Encounter Medications as of 04/21/2020  Medication Sig  . acetaminophen (TYLENOL) 500 MG tablet Take 1,000 mg by mouth every 8 (eight) hours as needed for moderate pain or headache.   Marland Kitchen atorvastatin (LIPITOR) 20 MG tablet TAKE 1 TABLET BY MOUTH EVERY DAY IN THE EVENING  . Carboxymethylcell-Hypromellose (GENTEAL OP) Place 1 drop into both eyes daily as needed (dry eyes).  . cetirizine (ZYRTEC) 5 MG tablet TAKE 1 TABLET BY MOUTH EVERY DAY  . Cholecalciferol (VITAMIN D) 50 MCG (2000 UT) tablet Take 1 tablet (2,000 Units total) by mouth daily.  . Cyanocobalamin (B-12) 1000 MCG CAPS TAKE 1 CAPSULE BY MOUTH EVERY DAY  . donepezil (ARICEPT) 10 MG tablet Take 1 tablet daily  . ELIQUIS 5 MG TABS tablet TAKE 1 TABLET BY MOUTH TWICE A DAY  . fluticasone (FLONASE) 50 MCG/ACT nasal spray Place 1 spray into both nostrils daily as needed for allergies or rhinitis.  Marland Kitchen levothyroxine (SYNTHROID) 88 MCG tablet TAKE 1 TABLET BY MOUTH EVERY MORNING ON AN EMPTY STOMACH  . lisinopril (ZESTRIL) 20 MG tablet TAKE 1 TABLET BY MOUTH EVERY DAY  . oxybutynin (DITROPAN-XL) 5 MG 24 hr tablet TAKE 1 TABLET BY MOUTH EVERY DAY  . sertraline (ZOLOFT) 100 MG tablet  Take 1 tablet (100 mg total) by mouth at bedtime.   No facility-administered encounter medications on file as of 04/21/2020.    Current Diagnosis: Patient Active Problem List   Diagnosis Date Noted  . Sinus congestion 12/18/2019  . Dementia without behavioral disturbance ([place]) 07/31/2019  . Sick sinus syndrome ([place]) 07/28/2019  . Epigastric pain 06/12/2019  . Localized swelling of left lower leg 04/11/2019  . Memory loss 04/11/2019  . Prediabetes 03/14/2019  . Complete heart block ([place]) 03/05/2019  . TIA (transient ischemic attack) 01/15/2019  . Gait abnormality 01/15/2019  . Transient ischemia 12/13/2018  . Dysarthria 12/11/2018  . Hypothyroidism 12/11/2018  . Arthritis of right hand 08/17/2018  . Chronic back pain 04/11/2018  . Encounter for therapeutic drug monitoring 03/09/2018  . Malignant hypertension 01/04/2018  . Clavicle fracture 11/20/2017  . Closed fracture of clavicle 11/20/2017  . Fall   . Accelerated hypertension   . Carpal tunnel syndrome of left wrist 08/02/2017  . Carpal tunnel syndrome of right wrist 08/02/2017  . Bradycardia 02/02/2017  . Presence of permanent cardiac pacemaker 01/28/2017  . Chronic a-fib ([place]) 01/28/2017  . Long term current use of anticoagulant therapy 01/28/2017  . Chronic venous insufficiency 01/28/2017  . Chronic atrial fibrillation ([place]) 01/28/2017  . Peripheral venous insufficiency 01/28/2017  . Cardiac pacemaker in situ 01/28/2017  . Anticoagulated 01/28/2017  . DOE (dyspnea on exertion) 09/22/2015  . Dyspnea on exertion  09/22/2015  . 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 05/15/2014  . After cataract of left eye not obscuring vision 05/15/2014  . Bilateral dry eyes 05/15/2014  . Dermatochalasis of eyelid 05/15/2014  . Hyperopia of both eyes with astigmatism and presbyopia 05/15/2014  . Irregular astigmatism of right eye 05/15/2014  . Mechanical complication due to intraocular lens implant 05/15/2014  . 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 Vitreous syneresis of both eyes 05/15/2014   Contacted [name] to discuss follow up call on blood pressure log. Per Debbora Dus, Pharm. D will call patient 05/06/20 at 10:00am to review updated readings. Patient aware and agrees to follow up call.  Are you having any problems with your medications? No problems with medications  What concerns would like to discuss with the pharmacist? Not at this time.   Follow-Up:  Pharmacist Review   Debbora Dus, CPP notified  Margaretmary Dys, [place] Pharmacy Assistant [phone]

## 2020-04-24 ENCOUNTER — Telehealth: Payer: Self-pay

## 2020-04-24 DIAGNOSIS — R32 Unspecified urinary incontinence: Secondary | ICD-10-CM

## 2020-04-24 NOTE — Telephone Encounter (Signed)
Spoke to Chicken, pt's daughter (DPR), and notified her of referral placed to OBGYN. [name] states that pt's urination issues are directly related to not having pessary in.  I also relayed Dr. Verda Cumins message to monitor pt's diarrhea to see if it resolves after dose increase. [name] will do that and ask pt's caregivers to do the same.   Appt with Anda Kraft for 04/25/20 will be canceled.

## 2020-04-24 NOTE — Telephone Encounter (Signed)
Will place referral to Gynecology for pessary replacement evaluation.   Unless she is having symptoms unrelated to the loss of pessary agree with no need for appointment here.   Would recommend monitoring diarrhea to see if this improves over the next 1-2 weeks after dose increase.

## 2020-04-24 NOTE — Addendum Note (Signed)
Addended by: [name] on: 04/24/2020 04:13 PM   Modules accepted: Orders

## 2020-04-24 NOTE — Telephone Encounter (Signed)
Pt lost her pessary 2 days ago when she was cleaning it. She is starting to have some issues with urination. Also having diarrhea due to increase in Zoloft dose from neurologist this week. She is scheduled to see Anda Kraft tomorrow but I advised daughter I thought there was not much we could do for her here in regards to the lost pessary besides treated a possible onset of a UTI. Should she call a urologist or could Dr Einar Pheasant do an urgent urology refrral? Please advise [name] at [phone]

## 2020-04-25 ENCOUNTER — Ambulatory Visit: Payer: Medicare Other | Admitting: Primary Care

## 2020-04-27 NOTE — Progress Notes (Unsigned)
Cardiology Office Note Date:  04/29/2020  Patient ID:  [name] Aug 05, 1930, MRN [id number] PCP:  [name], MD  Electrophysiologist: Dr. Caryl Comes     Chief Complaint: 6 mo  visit  History of Present Illness: [name] is a 87 y.o. female with history of permanent AFib, CHB w/PPM, HTN,  orthostatic hypotension, TIA  He comes today to be seen for Dr. Caryl Comes, last seen by him May 2021. She had adhesive capsulitis L shoulder and appears to have been referred for PT. She also noted discomfort associated with abandoned leads on the R, discussed surgical intervention/risks associated, she decided to defer. Mentioned mod AS, symptoms difficult to separate from co morbidities, planned for 67mo visit and recheck echo.Marland Kitchen HTN with orthostatic hypotension. Borderline renal function for her eliquis, no changes made. There was mention of LLE swelling?   TODAY She is doing ok she thinks. She denies any palpitations. She only has CP on the R side of her chest by her abandoned leads, says when she coughs tends to hurt there. She feels congested now for a few months, says she has spoken to "everyone" about it and nothing seems to work, mentioning having tried mucinex. She denies feeling SOB, but feels coongested even in her head like she is "just full" Her cough is productive with thick white sputum.  No fever or symptoms of illness. Has been coughing for months, not worse, not better over all abut more bothersome at night. Does not feel SOB though  She denies any dizzy spells, no near syncope or syncope, we discussed orthostatic symptoms, she denies this as well.  She denies bleeding or signs of bleeding.  She fells last week, stood to get up from the table and her chair slid out too fast behind her and she lost her balance and fell, denies injury, and denies falls as frequent   Device information MDT single chamber PPM initial implant by Dr. Doreatha Lew +/- 25 years  ago,  gen change 2018, gen change and RV new lead Dec 2020  patient has an abandoned RV lead (from old R sided device)   Past Medical History:  Diagnosis Date  . Arrhythmia    chronic atrial fib  . Atrial fibrillation, chronic ([place])   . Chronic anticoagulation   . Chronic atrial fibrillation ([place]) 01/28/2017  . Chronic venous insufficiency 01/28/2017  . Clavicle fracture 11/20/2017  . Complication of anesthesia    " difficult waking "  . Long term current use of anticoagulant therapy 01/28/2017  . Osteoarthritis   . Presence of permanent cardiac pacemaker 01/28/2017   Original implant 1992 Lead [employer] 431-04 Generator change 2001 and 2011.  Medtronic generator.   . SSS (sick sinus syndrome) ([place])   . Thyroid disease    hypothyroidism  . TIA (transient ischemic attack)     Past Surgical History:  Procedure Laterality Date  . ABDOMINAL HYSTERECTOMY    . INSERT / REPLACE / REMOVE PACEMAKER     generator change 2011 medtronic sigma  . KNEE SURGERY    . LEAD INSERTION N/A 03/05/2019   Procedure: LEAD INSERTION - RV LEAD;  Surgeon: Deboraha Sprang, MD;  Location: [facility];  Service: Cardiovascular;  Laterality: N/A;  . PACEMAKER IMPLANT N/A 03/05/2019   Procedure: PACEMAKER IMPLANT;  Surgeon: Deboraha Sprang, MD;  Location: [facility];  Service: Cardiovascular;  Laterality: N/A;  . PACEMAKER INSERTION    . PARS PLANA VITRECTOMY Right 07/19/2018  Procedure: VITRECTOMY WITH VITREOUS BIPOSY & ENDOLASER;  Surgeon: Jalene Mullet, MD;  Location: [facility];  Service: Ophthalmology;  Laterality: Right;  . PPM GENERATOR CHANGEOUT N/A 02/02/2017   Procedure: PPM GENERATOR CHANGEOUT;  Surgeon: Deboraha Sprang, MD;  Location: [facility];  Service: Cardiovascular;  Laterality: N/A;  . REPLACEMENT TOTAL KNEE BILATERAL    . VARICOSE VEIN SURGERY      Current Outpatient Medications  Medication Sig Dispense Refill  . acetaminophen (TYLENOL) 500 MG tablet Take 1,000 mg  by mouth every 8 (eight) hours as needed for moderate pain or headache.     Marland Kitchen atorvastatin (LIPITOR) 20 MG tablet TAKE 1 TABLET BY MOUTH EVERY DAY IN THE EVENING 90 tablet 1  . Carboxymethylcell-Hypromellose (GENTEAL OP) Place 1 drop into both eyes daily as needed (dry eyes).    . cetirizine (ZYRTEC) 5 MG tablet TAKE 1 TABLET BY MOUTH EVERY DAY 90 tablet 2  . Cholecalciferol (VITAMIN D) 50 MCG (2000 UT) tablet Take 1 tablet (2,000 Units total) by mouth daily. 90 tablet 3  . Cyanocobalamin (B-12) 1000 MCG CAPS TAKE 1 CAPSULE BY MOUTH EVERY DAY 30 capsule 11  . donepezil (ARICEPT) 10 MG tablet Take 1 tablet daily 90 tablet 3  . ELIQUIS 5 MG TABS tablet TAKE 1 TABLET BY MOUTH TWICE A DAY 60 tablet 2  . fluticasone (FLONASE) 50 MCG/ACT nasal spray Place 1 spray into both nostrils daily as needed for allergies or rhinitis. 9.9 mL 2  . levothyroxine (SYNTHROID) 88 MCG tablet TAKE 1 TABLET BY MOUTH EVERY MORNING ON AN EMPTY STOMACH 90 tablet 3  . lisinopril (ZESTRIL) 20 MG tablet TAKE 1 TABLET BY MOUTH EVERY DAY 90 tablet 1  . oxybutynin (DITROPAN-XL) 5 MG 24 hr tablet TAKE 1 TABLET BY MOUTH EVERY DAY 90 tablet 2  . sertraline (ZOLOFT) 100 MG tablet Take 1 tablet (100 mg total) by mouth at bedtime. 90 tablet 3   No current facility-administered medications for this visit.    Allergies:   Adhesive [tape] and Penicillins   Social History:  The patient  reports that she quit smoking about 42 years ago. Her smoking use included cigarettes. She has a 2.50 pack-year smoking history. She has never used smokeless tobacco. She reports that she does not drink alcohol and does not use drugs.   Family History:  The patient's family history includes Diabetes in her father and mother; Heart attack in her father; Mental illness in her mother.  ROS:  Please see the history of present illness.    All other systems are reviewed and otherwise negative.   PHYSICAL EXAM:  VS:  BP (!) 160/62   Pulse 80   Ht 5\' 2"   (1.575 m)   Wt 150 lb 6.4 oz (68.2 kg)   SpO2 94%   BMI 27.51 kg/m  BMI: Body mass index is 27.51 kg/m. Well nourished, well developed, in no acute distress HEENT: normocephalic, atraumatic Neck: no JVD, carotid bruits or masses Cardiac:  RRR; loud holosystolic murmur, no rubs, or gallops Lungs:  CTA b/l, no wheezing, rhonchi or rales Abd: soft, nontender MS: no deformity, age appropriate atrophy Ext: trace edema L>R, lareg varicose veins b/l Skin: warm and dry, no rash Neuro:  No gross deficits appreciated Psych: euthymic mood, full affect  PPM site is stable, no tethering or discomfort, b/l chest evaluated, skin is intact, no thinning   EKG:  Not done today  Device interrogation done today and reviewed by myself:  Battery and lead measurements are good No HVR   10/30/19: TTE IMPRESSIONS  1. Left ventricular ejection fraction, by estimation, is 60 to 65%. The  left ventricle has normal function. The left ventricle has no regional  wall motion abnormalities. There is mild concentric left ventricular  hypertrophy. Left ventricular diastolic  parameters are consistent with Grade II diastolic dysfunction  (pseudonormalization). Elevated left ventricular end-diastolic pressure.  The average left ventricular global longitudinal strain is -17.7 %. The  global longitudinal strain is normal.  2. Right ventricular systolic function is mildly reduced. The right  ventricular size is mildly enlarged. There is moderately elevated  pulmonary artery systolic pressure. The estimated right ventricular  systolic pressure is [id number] mmHg.  3. Left atrial size was severely dilated.  4. Right atrial size was severely dilated.  5. The mitral valve is normal in structure. Moderate mitral valve  regurgitation. No evidence of mitral stenosis.  6. Tricuspid valve regurgitation is moderate.  7. The aortic valve is tricuspid. Aortic valve regurgitation is moderate.  Moderate aortic valve  stenosis. Aortic regurgitation PHT measures 328  msec. Aortic valve mean gradient measures 29.0 mmHg. Aortic valve Vmax  measures 3.32 m/s. The dimensionless  index is low at 0.19.  8. The inferior vena cava is dilated in size with >50% respiratory  variability, suggesting right atrial pressure of 8 mmHg.  9. Compared to prior echo, transaortic gradients have increased from  37/52mmHg to 44/81mmHg, dimensionless index has decreased from 0.23 to  0.19 and AVA has decreased from 0.97cm2 to 0.48cm2 but LVOT measurement is  small and likely results in inaccurate  assessment of AVA.   Recent Labs: 05/28/2019: Hemoglobin 12.4; Platelets 202.0 09/17/2019: TSH 1.45 04/10/2020: ALT 15; BUN 21; Creatinine, Ser 1.27; Potassium 4.0; Sodium 136  04/10/2020: Cholesterol 142; HDL 70.30; LDL Cholesterol 60; Total CHOL/HDL Ratio 2; Triglycerides 60.0; VLDL 12.0   Estimated Creatinine Clearance: 28.2 mL/min (A) (by C-G formula based on SCr of 1.27 mg/dL (H)).   Wt Readings from Last 3 Encounters:  04/29/20 150 lb 6.4 oz (68.2 kg)  04/18/20 150 lb (68 kg)  04/10/20 146 lb 8 oz (66.5 kg)     Other studies reviewed: Additional studies/records reviewed today include: summarized above  ASSESSMENT AND PLAN:  1. PPM     Intact function  2. Permanent Afib     CHA2DS2Vasc is 6, on eliquis, appropriately dosed for Creat and weight     She has some falls, but denies even monthly     Update CBC  3. VHD w/at least mod AS     Loud murmur     Last echo Aug 2021, Dr. Caryl Comes reviewed with some progression of her AS, recommended annual  4. HTN 5. Orthostatic hypotension     No orthostatic symptoms noted at least of late     I thinks she may be able to tolerate lasix low dose if needed  6. Cough     Months of this, no symptoms of illness      Perhaps some volume 2/2 her AS, reduced RV function, p.HTN     Will check a CXR, may benefit from lasix, though will wait in the xray   She has help at home,  caregivers that give her meds and help out The person with her today is not who lves her the medicines, I have asked that once home they call to confirm our med list is accurate  Disposition: F/u in a month, sooner if needed  Current medicines are reviewed at length with the patient today.  The patient did not have any concerns regarding medicines.  Venetia Night, PA-C 04/29/2020 10:21 AM     [address] [place]  [phone] (office)  [phone] (fax)

## 2020-04-28 ENCOUNTER — Encounter (INDEPENDENT_AMBULATORY_CARE_PROVIDER_SITE_OTHER): Payer: Self-pay

## 2020-04-29 ENCOUNTER — Other Ambulatory Visit: Payer: Self-pay | Admitting: Family Medicine

## 2020-04-29 ENCOUNTER — Ambulatory Visit (INDEPENDENT_AMBULATORY_CARE_PROVIDER_SITE_OTHER): Payer: Medicare Other | Admitting: Physician Assistant

## 2020-04-29 ENCOUNTER — Encounter: Payer: Self-pay | Admitting: Physician Assistant

## 2020-04-29 ENCOUNTER — Other Ambulatory Visit: Payer: Self-pay

## 2020-04-29 ENCOUNTER — Ambulatory Visit
Admission: RE | Admit: 2020-04-29 | Discharge: 2020-04-29 | Disposition: A | Discharge status: Home / Self Care | Payer: Medicare Other | Source: Ambulatory Visit | Attending: Physician Assistant | Admitting: Physician Assistant

## 2020-04-29 VITALS — BP 160/62 | HR 80 | Ht 62.0 in | Wt 150.4 lb

## 2020-04-29 DIAGNOSIS — Z950 Presence of cardiac pacemaker: Secondary | ICD-10-CM

## 2020-04-29 DIAGNOSIS — R059 Cough, unspecified: Secondary | ICD-10-CM

## 2020-04-29 DIAGNOSIS — I350 Nonrheumatic aortic (valve) stenosis: Secondary | ICD-10-CM

## 2020-04-29 DIAGNOSIS — Z79899 Other long term (current) drug therapy: Secondary | ICD-10-CM

## 2020-04-29 DIAGNOSIS — E2839 Other primary ovarian failure: Secondary | ICD-10-CM

## 2020-04-29 DIAGNOSIS — I4821 Permanent atrial fibrillation: Secondary | ICD-10-CM | POA: Diagnosis not present

## 2020-04-29 DIAGNOSIS — I951 Orthostatic hypotension: Secondary | ICD-10-CM

## 2020-04-29 DIAGNOSIS — I10 Essential (primary) hypertension: Secondary | ICD-10-CM | POA: Diagnosis not present

## 2020-04-29 LAB — CBC
Hematocrit: 34.2 % (ref 34.0–46.6)
Hemoglobin: 11.2 g/dL (ref 11.1–15.9)
MCH: 27.9 pg (ref 26.6–33.0)
MCHC: 32.7 g/dL (ref 31.5–35.7)
MCV: 85 fL (ref 79–97)
Platelets: 188 10*3/uL (ref 150–450)
RBC: 4.01 x10E6/uL (ref 3.77–5.28)
RDW: 15 % (ref 11.7–15.4)
WBC: 7.6 10*3/uL (ref 3.4–10.8)

## 2020-04-29 LAB — CUP PACEART INCLINIC DEVICE CHECK
Date Time Interrogation Session: 20220208105912
Implantable Lead Implant Date: 20201214
Implantable Lead Location: 753860
Implantable Lead Model: 1948
Implantable Pulse Generator Implant Date: 20201214

## 2020-04-29 NOTE — Patient Instructions (Signed)
Medication Instructions:   Your physician recommends that you continue on your current medications as directed. Please refer to the Current Medication list given to you today.  *If you need a refill on your cardiac medications before your next appointment, please call your pharmacy*   Lab Work: CBC TODAY    If you have labs (blood work) drawn today and your tests are completely normal, you will receive your results only by: Marland Kitchen MyChart Message (if you have MyChart) OR . A paper copy in the mail If you have any lab test that is abnormal or we need to change your treatment, we will call you to review the results.   Testing/Procedures: A chest x-ray takes a picture of the organs and structures inside the chest, including the heart, lungs, and blood vessels. This test can show several things, including, whether the heart is enlarges; whether fluid is building up in the lungs; and whether pacemaker / defibrillator leads are still in place. [place], [place], [place] [phone]    Follow-Up: At [facility], you and your health needs are our priority.  As part of our continuing mission to provide you with exceptional heart care, we have created designated Provider Care Teams.  These Care Teams include your primary Cardiologist (physician) and Advanced Practice Providers (APPs -  Physician Assistants and Nurse Practitioners) who all work together to provide you with the care you need, when you need it.  We recommend signing up for the patient portal called "MyChart".  Sign up information is provided on this After Visit Summary.  MyChart is used to connect with patients for Virtual Visits (Telemedicine).  Patients are able to view lab/test results, encounter notes, upcoming appointments, etc.  Non-urgent messages can be sent to your provider as well.   To learn more about what you can do with MyChart, go to [email or web address].    Your next appointment:   1 month(s)  The  format for your next appointment:   In Person  Provider:   Tommye Standard, PA-C   Other Instructions

## 2020-04-30 ENCOUNTER — Ambulatory Visit (INDEPENDENT_AMBULATORY_CARE_PROVIDER_SITE_OTHER): Payer: Medicare Other | Admitting: Obstetrics and Gynecology

## 2020-04-30 ENCOUNTER — Other Ambulatory Visit: Payer: Self-pay | Admitting: Family Medicine

## 2020-04-30 ENCOUNTER — Encounter: Payer: Self-pay | Admitting: Obstetrics and Gynecology

## 2020-04-30 VITALS — BP 128/78 | HR 68 | Ht 61.0 in | Wt 148.0 lb

## 2020-04-30 DIAGNOSIS — N8110 Cystocele, unspecified: Secondary | ICD-10-CM | POA: Diagnosis not present

## 2020-04-30 DIAGNOSIS — R39198 Other difficulties with micturition: Secondary | ICD-10-CM
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
## 2020-04-30 NOTE — Telephone Encounter (Signed)
Pharmacy requests refill on: Oxybutynin 5 mg 24 hr   LAST REFILL: 08/28/2019 (Q-90, R-2) LAST OV: 04/10/2020 NEXT OV: 07/15/2020 PHARMACY: [id number] [place], [place]   Earliest Fill Date: 05/27/2020

## 2020-05-02 LAB — URINALYSIS, COMPLETE W/RFL CULTURE
Bacteria, UA: NONE SEEN /HPF
Bilirubin Urine: NEGATIVE
Glucose, UA: NEGATIVE
Hyaline Cast: NONE SEEN /LPF
Ketones, ur: NEGATIVE
Leukocyte Esterase: NEGATIVE
Nitrites, Initial: NEGATIVE
Protein, ur: NEGATIVE
Specific Gravity, Urine: 1.02 (ref 1.001–1.03)
WBC, UA: NONE SEEN /HPF (ref 0–5)
pH: 7 (ref 5.0–8.0)

## 2020-05-02 LAB — URINE CULTURE
MICRO NUMBER:: 11513991
Result:: NO GROWTH
SPECIMEN QUALITY:: ADEQUATE

## 2020-05-02 LAB — CULTURE INDICATED

## 2020-05-04 ENCOUNTER — Telehealth: Payer: Self-pay | Admitting: Obstetrics and Gynecology

## 2020-05-04 NOTE — Telephone Encounter (Signed)
Please order #3 ring with support pessary for patient and schedule a follow up appointment when it has arrived.

## 2020-05-05 NOTE — Telephone Encounter (Signed)
Spoke with patient, OV scheduled for 05/07/20 at 2:30pm w/ Dr. Quincy Simmonds. Patient is agreeable to date and time.    Pessary to [employer].   Routing to provider for final review. Patient is agreeable to disposition. Will close encounter.

## 2020-05-06 ENCOUNTER — Telehealth: Payer: Self-pay

## 2020-05-06 NOTE — Chronic Care Management (AMB) (Addendum)
Chronic Care Management Pharmacy Assistant   Name: CRETA DORAME  MRN: [id number] DOB: September 09, 1930  Reason for Encounter: Disease state - Update BP log  PCP : [name], MD  Allergies:   Allergies  Allergen Reactions   Adhesive [Tape] Other (See Comments)    TAPE WILL TEAR THE SKIN!!!!   Penicillins Hives and Rash    Did it involve swelling of the face/tongue/throat, SOB, or low BP? No Did it involve sudden or severe rash/hives, skin peeling, or any reaction on the inside of your mouth or nose? Yes Did you need to seek medical attention at a hospital or doctor's office? Unknown  When did it last happen?      unknown  If all above answers are "NO", may proceed with cephalosporin use.     Medications: Outpatient Encounter Medications as of 05/06/2020  Medication Sig   acetaminophen (TYLENOL) 500 MG tablet Take 1,000 mg by mouth every 8 (eight) hours as needed for moderate pain or headache.    atorvastatin (LIPITOR) 20 MG tablet TAKE 1 TABLET BY MOUTH EVERY DAY IN THE EVENING   Carboxymethylcell-Hypromellose (GENTEAL OP) Place 1 drop into both eyes daily as needed (dry eyes).   cetirizine (ZYRTEC) 5 MG tablet TAKE 1 TABLET BY MOUTH EVERY DAY   Cholecalciferol (VITAMIN D) 50 MCG (2000 UT) tablet Take 1 tablet (2,000 Units total) by mouth daily.   Cyanocobalamin (B-12) 1000 MCG CAPS TAKE 1 CAPSULE BY MOUTH EVERY DAY   donepezil (ARICEPT) 10 MG tablet Take 1 tablet daily   ELIQUIS 5 MG TABS tablet TAKE 1 TABLET BY MOUTH TWICE A DAY   fluticasone (FLONASE) 50 MCG/ACT nasal spray Place 1 spray into both nostrils daily as needed for allergies or rhinitis.   levothyroxine (SYNTHROID) 88 MCG tablet TAKE 1 TABLET BY MOUTH EVERY MORNING ON AN EMPTY STOMACH   lisinopril (ZESTRIL) 20 MG tablet TAKE 1 TABLET BY MOUTH EVERY DAY   oxybutynin (DITROPAN-XL) 5 MG 24 hr tablet TAKE 1 TABLET BY MOUTH EVERY DAY   [DISCONTINUED] sertraline (ZOLOFT) 100 MG tablet Take 1 tablet (100 mg  total) by mouth at bedtime.   No facility-administered encounter medications on file as of 05/06/2020.    Current Diagnosis: Patient Active Problem List   Diagnosis Date Noted   Sinus congestion 12/18/2019   Dementia without behavioral disturbance ([place]) 07/31/2019   Sick sinus syndrome ([place]) 07/28/2019   Epigastric pain 06/12/2019   Localized swelling of left lower leg 04/11/2019   Memory loss 04/11/2019   Prediabetes 03/14/2019   Complete heart block ([place]) 03/05/2019   TIA (transient ischemic attack) 01/15/2019   Gait abnormality 01/15/2019   Transient ischemia 12/13/2018   Dysarthria 12/11/2018   Hypothyroidism 12/11/2018   Arthritis of right hand 08/17/2018   Chronic back pain 04/11/2018   Encounter for therapeutic drug monitoring 03/09/2018   Malignant hypertension 01/04/2018   Clavicle fracture 11/20/2017   Closed fracture of clavicle 11/20/2017   Fall    Accelerated hypertension    Carpal tunnel syndrome of left wrist 08/02/2017   Carpal tunnel syndrome of right wrist 08/02/2017   Bradycardia 02/02/2017   Presence of permanent cardiac pacemaker 01/28/2017   Chronic a-fib ([place]) 01/28/2017   Long term current use of anticoagulant therapy 01/28/2017   Chronic venous insufficiency 01/28/2017   Chronic atrial fibrillation ([place]) 01/28/2017   Peripheral venous insufficiency 01/28/2017   Cardiac pacemaker in situ 01/28/2017   Anticoagulated 01/28/2017   DOE (dyspnea on exertion) 09/22/2015  Dyspnea on exertion 09/22/2015   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 05/15/2014   After cataract of left eye not obscuring vision 05/15/2014   Bilateral dry eyes 05/15/2014   Dermatochalasis of eyelid 05/15/2014   Hyperopia of both eyes with astigmatism and presbyopia 05/15/2014   Irregular astigmatism of right eye 05/15/2014   Mechanical complication due to intraocular lens implant 05/15/2014   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 Spoke with patient regarding BP  Recent Office Vitals: BP Readings from Last 3 Encounters:  05/09/20 (!) 166/106  05/09/20 (!) 163/88  05/07/20 128/72   Pulse Readings from Last 3 Encounters:  05/09/20 68  05/09/20 80  05/07/20 82    Wt Readings from Last 3 Encounters:  05/07/20 148 lb (67.1 kg)  04/30/20 148 lb (67.1 kg)  04/29/20 150 lb 6.4 oz (68.2 kg)     Kidney Function Lab Results  Component Value Date/Time   CREATININE 1.15 (H) 05/09/2020 02:52 PM   CREATININE 1.27 (H) 04/10/2020 10:11 AM   GFR 38.04 (L) 04/10/2020 10:11 AM   GFRNONAA 46 (L) 05/09/2020 02:52 PM   GFRAA 49 (L) 02/13/2019 12:00 AM    BMP Latest Ref Rng & Units 05/09/2020 04/10/2020 07/31/2019  Glucose 70 - 99 mg/dL 113(H) 107(H) 97  BUN 8 - 23 mg/dL 15 21 19   Creatinine 0.44 - 1.00 mg/dL 1.15(H) 1.27(H) 0.99  BUN/Creat Ratio 12 - 28 - - -  Sodium 135 - 145 mmol/L 132(L) 136 138  Potassium 3.5 - 5.1 mmol/L 3.9 4.0 4.5  Chloride 98 - 111 mmol/L 96(L) 100 103  CO2 22 - 32 mmol/L 26 31 29   Calcium 8.9 - 10.3 mg/dL 9.1 9.6 9.5     Since last contact with Ms. Myricks, she has had a medication change and ED visit. On 05/08/20 patient contacted neurologist with complaints that sertraline was causing an altered gait and she felt unsteady. Dr. Delice Lesch reduced sertraline back to 50 mg daily. Patient was seen in the ED 05/09/20 due to epigastric pain. She states she left before being seen as she had not eaten all day nor had she taken her medications. She still has some pain in her stomach. She states she had a moment of fecal incontinence a couple of days ago. She states most times she is constipated. States she does not fit in her jeans as her stomach is bloated. The bloating has been ongoing for about 3 weeks.  She states she has follow up tomorrow with Dr. Einar Pheasant.   Current antihypertensive regimen:  Lisinopril 20 mg - 1 tablet daily  How often are you checking  your Blood Pressure? daily  she checks her blood pressure in the middle of the day after taking her medication.  Current home BP readings:  Does not have dates for any of the readings.    BP               PULSE 158/97  68 160/90  64 134/77  71 138/71  65 123/63  65 174/99  71 159/90  68 163/97  66  Wrist or arm cuff: Arm Caffeine intake: Coffee in morning Salt intake: Does not each much salt. Does not add to food.  OTC medications including pseudoephedrine or NSAIDs?  No  Adherence Review: Is the patient currently on ACE/ARB medication? Yes Does the patient have >5 day gap between last estimated fill dates? No  Follow-Up:  Pharmacist Review  Debbora Dus, CPP notified  Margaretmary Dys, [place] Assistant [phone]  Recommend lisinopril dose increase. Will consult PCP.  Debbora Dus, PharmD Clinical Pharmacist [facility] [phone]

## 2020-05-07 ENCOUNTER — Ambulatory Visit (INDEPENDENT_AMBULATORY_CARE_PROVIDER_SITE_OTHER): Payer: Medicare Other | Admitting: Obstetrics and Gynecology

## 2020-05-07 ENCOUNTER — Encounter: Payer: Self-pay | Admitting: Obstetrics and Gynecology

## 2020-05-07 ENCOUNTER — Other Ambulatory Visit: Payer: Self-pay

## 2020-05-07 VITALS — BP 128/72 | HR 82 | Ht 61.0 in | Wt 148.0 lb

## 2020-05-07 DIAGNOSIS — Z4689 Encounter for fitting and adjustment of other specified devices: Secondary | ICD-10-CM | POA: Diagnosis not present

## 2020-05-07 DIAGNOSIS — N8110 Cystocele, unspecified: Secondary | ICD-10-CM | POA: Diagnosis not present

## 2020-05-07 NOTE — Progress Notes (Signed)
GYNECOLOGY  VISIT   HPI: 87 y.o.   Widowed  Caucasian  female   G3P3 with No LMP recorded. Patient has had a hysterectomy.   here for a pessary insertion.     Patient shown her potential newin  #3 ring with support in its package, and she stated it is smaller than what she previously had.   Patient has been able to place and removed her pessary at home in the past.   GYNECOLOGIC HISTORY: No LMP recorded. Patient has had a hysterectomy. Contraception:  Hysterectomy Menopausal hormone therapy:  none Last mammogram:  unsure Last pap smear:   unsure        OB History    Gravida  3   Para  3   Term      Preterm      AB      Living  2     SAB      IAB      Ectopic      Multiple      Live Births                 Patient Active Problem List   Diagnosis Date Noted  . Sinus congestion 12/18/2019  . Dementia without behavioral disturbance ([place]) 07/31/2019  . Sick sinus syndrome ([place]) 07/28/2019  . Epigastric pain 06/12/2019  . Localized swelling of left lower leg 04/11/2019  . Memory loss 04/11/2019  . Prediabetes 03/14/2019  . Complete heart block ([place]) 03/05/2019  . TIA (transient ischemic attack) 01/15/2019  . Gait abnormality 01/15/2019  . Transient ischemia 12/13/2018  . Dysarthria 12/11/2018  . Hypothyroidism 12/11/2018  . Arthritis of right hand 08/17/2018  . Chronic back pain 04/11/2018  . Encounter for therapeutic drug monitoring 03/09/2018  . Malignant hypertension 01/04/2018  . Clavicle fracture 11/20/2017  . Closed fracture of clavicle 11/20/2017  . Fall   . Accelerated hypertension   . Carpal tunnel syndrome of left wrist 08/02/2017  . Carpal tunnel syndrome of right wrist 08/02/2017  . Bradycardia 02/02/2017  . Presence of permanent cardiac pacemaker 01/28/2017  . Chronic a-fib ([place]) 01/28/2017  . Long term current use of anticoagulant therapy 01/28/2017  . Chronic venous insufficiency 01/28/2017  . Chronic atrial fibrillation ([place])  01/28/2017  . Peripheral venous insufficiency 01/28/2017  . Cardiac pacemaker in situ 01/28/2017  . Anticoagulated 01/28/2017  . DOE (dyspnea on exertion) 09/22/2015  . Dyspnea on exertion 09/22/2015  . 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 05/15/2014  . After cataract of left eye not obscuring vision 05/15/2014  . Bilateral dry eyes 05/15/2014  . Dermatochalasis of eyelid 05/15/2014  . Hyperopia of both eyes with astigmatism and presbyopia 05/15/2014  . Irregular astigmatism of right eye 05/15/2014  . Mechanical complication due to intraocular lens implant 05/15/2014  . 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 Vitreous syneresis of both eyes 05/15/2014    Past Medical History:  Diagnosis Date  . Arrhythmia    chronic atrial fib  . Atrial fibrillation, chronic ([place])   . Chronic anticoagulation   . Chronic atrial fibrillation ([place]) 01/28/2017  . Chronic venous insufficiency 01/28/2017  . Clavicle fracture 11/20/2017  . Complication of anesthesia    " difficult waking "  . Long term current use of anticoagulant therapy 01/28/2017  . Osteoarthritis   . Presence of permanent cardiac pacemaker 01/28/2017   Original implant 1992 Lead Intermedics 431-04 Generator change 2001 and 2011.  Medtronic generator.   . SSS (sick sinus syndrome) ([place])   .  Thyroid disease    hypothyroidism  . TIA (transient ischemic attack)     Past Surgical History:  Procedure Laterality Date  . ABDOMINAL HYSTERECTOMY    . INSERT / REPLACE / REMOVE PACEMAKER     generator change 2011 medtronic sigma  . KNEE SURGERY    . LEAD INSERTION N/A 03/05/2019   Procedure: LEAD INSERTION - RV LEAD;  Surgeon: Deboraha Sprang, MD;  Location: [facility];  Service: Cardiovascular;  Laterality: N/A;  . PACEMAKER IMPLANT N/A 03/05/2019   Procedure: PACEMAKER IMPLANT;  Surgeon: Deboraha Sprang, MD;  Location: [facility];  Service: Cardiovascular;  Laterality: N/A;  . PACEMAKER INSERTION    . PARS PLANA VITRECTOMY  Right 07/19/2018   Procedure: VITRECTOMY WITH VITREOUS BIPOSY & ENDOLASER;  Surgeon: Jalene Mullet, MD;  Location: [facility];  Service: Ophthalmology;  Laterality: Right;  . PPM GENERATOR CHANGEOUT N/A 02/02/2017   Procedure: PPM GENERATOR CHANGEOUT;  Surgeon: Deboraha Sprang, MD;  Location: [facility];  Service: Cardiovascular;  Laterality: N/A;  . REPLACEMENT TOTAL KNEE BILATERAL    . VARICOSE VEIN SURGERY      Current Outpatient Medications  Medication Sig Dispense Refill  . acetaminophen (TYLENOL) 500 MG tablet Take 1,000 mg by mouth every 8 (eight) hours as needed for moderate pain or headache.     Marland Kitchen atorvastatin (LIPITOR) 20 MG tablet TAKE 1 TABLET BY MOUTH EVERY DAY IN THE EVENING 90 tablet 1  . Carboxymethylcell-Hypromellose (GENTEAL OP) Place 1 drop into both eyes daily as needed (dry eyes).    . cetirizine (ZYRTEC) 5 MG tablet TAKE 1 TABLET BY MOUTH EVERY DAY 90 tablet 2  . Cholecalciferol (VITAMIN D) 50 MCG (2000 UT) tablet Take 1 tablet (2,000 Units total) by mouth daily. 90 tablet 3  . Cyanocobalamin (B-12) 1000 MCG CAPS TAKE 1 CAPSULE BY MOUTH EVERY DAY 30 capsule 11  . donepezil (ARICEPT) 10 MG tablet Take 1 tablet daily 90 tablet 3  . ELIQUIS 5 MG TABS tablet TAKE 1 TABLET BY MOUTH TWICE A DAY 60 tablet 2  . fluticasone (FLONASE) 50 MCG/ACT nasal spray Place 1 spray into both nostrils daily as needed for allergies or rhinitis. 9.9 mL 2  . levothyroxine (SYNTHROID) 88 MCG tablet TAKE 1 TABLET BY MOUTH EVERY MORNING ON AN EMPTY STOMACH 90 tablet 3  . lisinopril (ZESTRIL) 20 MG tablet TAKE 1 TABLET BY MOUTH EVERY DAY 90 tablet 1  . oxybutynin (DITROPAN-XL) 5 MG 24 hr tablet TAKE 1 TABLET BY MOUTH EVERY DAY 90 tablet 2  . sertraline (ZOLOFT) 100 MG tablet Take 1 tablet (100 mg total) by mouth at bedtime. 90 tablet 3   No current facility-administered medications for this visit.     ALLERGIES: Adhesive [tape] and Penicillins  Family History  Problem Relation Age of Onset   . Diabetes Mother   . Mental illness Mother   . Diabetes Father   . Heart attack Father     Social History   Socioeconomic History  . Marital status: Widowed    Spouse name: Not on file  . Number of children: 2  . Years of education: 2- year college  . Highest education level: Not on file  Occupational History  . Not on file  Tobacco Use  . Smoking status: Former Smoker    Packs/day: 0.25    Years: 10.00    Pack years: 2.50    Types: Cigarettes    Quit date: 1980  Years since quitting: 42.1  . Smokeless tobacco: Never Used  Vaping Use  . Vaping Use: Never used  Substance and Sexual Activity  . Alcohol use: No  . Drug use: No  . Sexual activity: Never  Other Topics Concern  . Not on file  Social History Narrative   03/14/19   From: the area   Living: alone, but caretaker that - 20 hours a day of care givers, is alone from 4-8 pm   Work: retired -    Widowed: husband died from [place], used to run a golf course      Family: 2 children - [name] ([place]), Marcello Moores (lives nearby), has grandchildren and Designer, industrial/product      Enjoys: play golf (not as much), painting, crafting    Right handed      Right handed   Exercise: yard work   Diet: anything, whatever she can find      Safety   Seat belts: Yes    Guns: No   Safe in relationships: Yes    Social Determinants of Radio broadcast assistant Strain: Low Risk   . Difficulty of Paying Living Expenses: Not hard at all  Food Insecurity: No Food Insecurity  . Worried About Charity fundraiser in the Last Year: Never true  . Ran Out of Food in the Last Year: Never true  Transportation Needs: No Transportation Needs  . Lack of Transportation (Medical): No  . Lack of Transportation (Non-Medical): No  Physical Activity: Inactive  . Days of Exercise per Week: 0 days  . Minutes of Exercise per Session: 0 min  Stress: No Stress Concern Present  . Feeling of Stress : Not at all  Social  Connections: Not on file  Intimate Partner Violence: Not At Risk  . Fear of Current or Ex-Partner: No  . Emotionally Abused: No  . Physically Abused: No  . Sexually Abused: No    Review of Systems  Constitutional: Negative.   HENT: Negative.   Eyes: Negative.   Respiratory: Negative.   Cardiovascular: Negative.   Gastrointestinal: Negative.   Endocrine: Negative.   Genitourinary: Negative.   Musculoskeletal: Negative.   Skin: Negative.   Allergic/Immunologic: Negative.   Neurological: Negative.   Hematological: Negative.   Psychiatric/Behavioral: Negative.     PHYSICAL EXAMINATION:    BP 128/72   Pulse 82   Ht '5\' 1"'  (1.549 m)   Wt 148 lb (67.1 kg)   SpO2 97%   BMI 27.96 kg/m     General appearance: alert, cooperative and appears stated age  Pelvic: External genitalia:  no lesions              Urethra:  normal appearing urethra with no masses, tenderness or lesions              Bimanual Exam:  Uterus:  normal size, contour, position, consistency, mobility, non-tender              Adnexa: no mass, fullness, tenderness               Fitting kit used to try #4 ring with support and patient is comfortable with this and maintains it with doing maneuvers.  Fitting kit pessary removed.  New pessary was placed.   Chaperone was present for exam.  ASSESSMENT  Bladder prolapse.  Pessary care.   PLAN  Patient received a new number 4 ring with support, lot [id number], expiration 10/20/29.  FU in 7 - 10 days  for a recheck, sooner as needed.

## 2020-05-08 ENCOUNTER — Other Ambulatory Visit: Payer: Self-pay

## 2020-05-08 ENCOUNTER — Telehealth: Payer: Self-pay | Admitting: Neurology

## 2020-05-08 MED ORDER — SERTRALINE HCL 50 MG PO TABS: 50.0000 mg | ORAL_TABLET | Freq: Every day | ORAL | 2 refills | Status: AC

## 2020-05-08 NOTE — Telephone Encounter (Signed)
Let's reduce sertraline back to what she was taking before, 50mg  daily, pls send in updated Rx, thanks

## 2020-05-08 NOTE — Telephone Encounter (Signed)
[name] stating her Sertraline is causing her to not be able to walk strait and she can't stand up. Her feet rock a lot. She can't get steady on her feet. She would like to see if she can lower the dosage or whatever Dr. Delice Lesch wants to do.

## 2020-05-08 NOTE — Telephone Encounter (Signed)
Spoke with Mrs Reitter informed her to reduce sertraline back to what she was taking before, 50mg  daily. And that a new script was called in

## 2020-05-09 ENCOUNTER — Encounter (HOSPITAL_COMMUNITY): Payer: Self-pay

## 2020-05-09 ENCOUNTER — Emergency Department (HOSPITAL_COMMUNITY): Payer: Medicare Other

## 2020-05-09 ENCOUNTER — Encounter (HOSPITAL_COMMUNITY): Payer: Self-pay | Admitting: Emergency Medicine

## 2020-05-09 ENCOUNTER — Emergency Department (HOSPITAL_COMMUNITY)
Admission: EM | Admit: 2020-05-09 | Discharge: 2020-05-09 | Disposition: A | Discharge status: Home / Self Care | Payer: Medicare Other | Attending: Emergency Medicine | Admitting: Emergency Medicine

## 2020-05-09 ENCOUNTER — Ambulatory Visit (HOSPITAL_COMMUNITY)
Admission: EM | Admit: 2020-05-09 | Discharge: 2020-05-09 | Disposition: A | Discharge status: Home / Self Care | Payer: Medicare Other | Attending: Family | Admitting: Family

## 2020-05-09 ENCOUNTER — Other Ambulatory Visit: Payer: Self-pay

## 2020-05-09 DIAGNOSIS — I517 Cardiomegaly: Secondary | ICD-10-CM | POA: Diagnosis not present

## 2020-05-09 DIAGNOSIS — R0602 Shortness of breath: Secondary | ICD-10-CM | POA: Insufficient documentation

## 2020-05-09 DIAGNOSIS — Z5321 Procedure and treatment not carried out due to patient leaving prior to being seen by health care provider: Secondary | ICD-10-CM | POA: Diagnosis not present

## 2020-05-09 DIAGNOSIS — J439 Emphysema, unspecified: Secondary | ICD-10-CM | POA: Diagnosis not present

## 2020-05-09 DIAGNOSIS — R140 Abdominal distension (gaseous): Secondary | ICD-10-CM | POA: Diagnosis not present

## 2020-05-09 DIAGNOSIS — R1013 Epigastric pain: Secondary | ICD-10-CM | POA: Diagnosis not present

## 2020-05-09 DIAGNOSIS — R1010 Upper abdominal pain, unspecified: Secondary | ICD-10-CM | POA: Diagnosis not present

## 2020-05-09 LAB — CBC
HCT: 38.3 % (ref 36.0–46.0)
Hemoglobin: 12 g/dL (ref 12.0–15.0)
MCH: 27.8 pg (ref 26.0–34.0)
MCHC: 31.3 g/dL (ref 30.0–36.0)
MCV: 88.9 fL (ref 80.0–100.0)
Platelets: 192 10*3/uL (ref 150–400)
RBC: 4.31 MIL/uL (ref 3.87–5.11)
RDW: 14.9 % (ref 11.5–15.5)
WBC: 7.7 10*3/uL (ref 4.0–10.5)
nRBC: 0 % (ref 0.0–0.2)

## 2020-05-09 LAB — BASIC METABOLIC PANEL
Anion gap: 10 (ref 5–15)
BUN: 15 mg/dL (ref 8–23)
CO2: 26 mmol/L (ref 22–32)
Calcium: 9.1 mg/dL (ref 8.9–10.3)
Chloride: 96 mmol/L — ABNORMAL LOW (ref 98–111)
Creatinine, Ser: 1.15 mg/dL — ABNORMAL HIGH (ref 0.44–1.00)
GFR, Estimated: 46 mL/min — ABNORMAL LOW (ref >60)
Glucose, Bld: 113 mg/dL — ABNORMAL HIGH (ref 70–99)
Potassium: 3.9 mmol/L (ref 3.5–5.1)
Sodium: 132 mmol/L — ABNORMAL LOW (ref 135–145)

## 2020-05-09 LAB — POCT URINALYSIS DIPSTICK, ED / UC
Bilirubin Urine: NEGATIVE
Glucose, UA: NEGATIVE mg/dL
Hgb urine dipstick: NEGATIVE
Ketones, ur: NEGATIVE mg/dL
Leukocytes,Ua: NEGATIVE
Nitrite: NEGATIVE
Protein, ur: NEGATIVE mg/dL
Specific Gravity, Urine: 1.025 (ref 1.005–1.030)
Urobilinogen, UA: 2 mg/dL — ABNORMAL HIGH (ref 0.0–1.0)
pH: 6.5 (ref 5.0–8.0)

## 2020-05-09 LAB — TROPONIN I (HIGH SENSITIVITY)
Troponin I (High Sensitivity): 20 ng/L — ABNORMAL HIGH (ref <18)
Troponin I (High Sensitivity): 22 ng/L — ABNORMAL HIGH (ref <18)

## 2020-05-09 NOTE — ED Notes (Addendum)
Pt upset about the wait time, this NT apologized to pt multiple times about having to wait. Recommended the pt stay to be seen if possible. Pt continued to express concern about how long she has waited and not being able to take medication or eat the "food she is supposed to eat, not the food here". Pt stated "I will wait here until 10:00 and if I am not taken to a room then I am leaving".

## 2020-05-09 NOTE — ED Provider Notes (Signed)
[facility]    CSN: [id number] Arrival date & time: 05/09/20  1142      History   Chief Complaint Chief Complaint  Patient presents with  . Abdominal Pain    HPI [name] is a 87 y.o. female.   87 year old female accompanied by her caregiver with concern over central and upper abdominal pain. Started with more central abdominal pain near her umbilicus for the past few weeks. Last week started having more consistent pain that has traveled more epigastric and left upper abdominal area. Has noticed more gas and belching over the past week that has become so bothersome that it interferes with her sleep. She has not slept well over the past 3 nights. She had some loose stools/diarrhea last week but today had 2 small bowel movements that were hard. Pain has increased with trying to eat and she has had a slight decrease in appetite. She denies any fever, chest pain, flank pain or pain with urination. She does have a pessary in place and indicates that she has a hard time urinating. She also has noticed more short term memory loss and more blurred vision over the past week. She says "I just don't feel right". Other chronic health issues include chronic A. Fib with pacemaker, hypothyroidism, arthritis, HTN, and history of TIA. Current medications include Lipitor, Lisinopril, Eliquis, Synthroid, Ditropan XL, Aricept, Zyrtec, Flonase, vit B12 and vit D daily and Tylenol prn. Her health care provider had increased her Zoloft but she felt too drowsy so they decreased it back to 50mg  yesterday (reviewed phone note from Neurologist). Had recent lab work (CBC) earlier in February that did not show any infection or anemia. She had a Chemistry panel in late January that showed decrease in kidney function and slight increase in glucose but otherwise normal.   The history is provided by the patient.    Past Medical History:  Diagnosis Date  . Arrhythmia    chronic atrial fib  . Atrial  fibrillation, chronic ([place])   . Chronic anticoagulation   . Chronic atrial fibrillation ([place]) 01/28/2017  . Chronic venous insufficiency 01/28/2017  . Clavicle fracture 11/20/2017  . Complication of anesthesia    " difficult waking "  . Long term current use of anticoagulant therapy 01/28/2017  . Osteoarthritis   . Presence of permanent cardiac pacemaker 01/28/2017   Original implant 1992 Lead [employer]med[employer] 431-04 Generator change 2001 and 2011.  Medtronic generator.   . SSS (sick sinus syndrome) ([place])   . Thyroid disease    hypothyroidism  . TIA (transient ischemic attack)     Patient Active Problem List   Diagnosis Date Noted  . Sinus congestion 12/18/2019  . Dementia without behavioral disturbance ([place]) 07/31/2019  . Sick sinus syndrome ([place]) 07/28/2019  . Epigastric pain 06/12/2019  . Localized swelling of left lower leg 04/11/2019  . Memory loss 04/11/2019  . Prediabetes 03/14/2019  . Complete heart block ([place]) 03/05/2019  . TIA (transient ischemic attack) 01/15/2019  . Gait abnormality 01/15/2019  . Transient ischemia 12/13/2018  . Dysarthria 12/11/2018  . Hypothyroidism 12/11/2018  . Arthritis of right hand 08/17/2018  . Chronic back pain 04/11/2018  . Encounter for therapeutic drug monitoring 03/09/2018  . Malignant hypertension 01/04/2018  . Clavicle fracture 11/20/2017  . Closed fracture of clavicle 11/20/2017  . Fall   . Accelerated hypertension   . Carpal tunnel syndrome of left wrist 08/02/2017  . Carpal tunnel syndrome of right wrist 08/02/2017  .  Bradycardia 02/02/2017  . Presence of permanent cardiac pacemaker 01/28/2017  . Chronic a-fib ([place]) 01/28/2017  . Long term current use of anticoagulant therapy 01/28/2017  . Chronic venous insufficiency 01/28/2017  . Chronic atrial fibrillation ([place]) 01/28/2017  . Peripheral venous insufficiency 01/28/2017  . Cardiac pacemaker in situ 01/28/2017  . Anticoagulated 01/28/2017  . DOE (dyspnea on exertion) 09/22/2015   . Dyspnea on exertion 09/22/2015  . 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 05/15/2014  . After cataract of left eye not obscuring vision 05/15/2014  . Bilateral dry eyes 05/15/2014  . Dermatochalasis of eyelid 05/15/2014  . Hyperopia of both eyes with astigmatism and presbyopia 05/15/2014  . Irregular astigmatism of right eye 05/15/2014  . Mechanical complication due to intraocular lens implant 05/15/2014  . 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 Vitreous syneresis of both eyes 05/15/2014    Past Surgical History:  Procedure Laterality Date  . ABDOMINAL HYSTERECTOMY    . INSERT / REPLACE / REMOVE PACEMAKER     generator change 2011 medtronic sigma  . KNEE SURGERY    . LEAD INSERTION N/A 03/05/2019   Procedure: LEAD INSERTION - RV LEAD;  Surgeon: Deboraha Sprang, MD;  Location: [facility];  Service: Cardiovascular;  Laterality: N/A;  . PACEMAKER IMPLANT N/A 03/05/2019   Procedure: PACEMAKER IMPLANT;  Surgeon: Deboraha Sprang, MD;  Location: [facility];  Service: Cardiovascular;  Laterality: N/A;  . PACEMAKER INSERTION    . PARS PLANA VITRECTOMY Right 07/19/2018   Procedure: VITRECTOMY WITH VITREOUS BIPOSY & ENDOLASER;  Surgeon: Jalene Mullet, MD;  Location: [facility];  Service: Ophthalmology;  Laterality: Right;  . PPM GENERATOR CHANGEOUT N/A 02/02/2017   Procedure: PPM GENERATOR CHANGEOUT;  Surgeon: Deboraha Sprang, MD;  Location: [facility];  Service: Cardiovascular;  Laterality: N/A;  . REPLACEMENT TOTAL KNEE BILATERAL    . VARICOSE VEIN SURGERY      OB History    Gravida  3   Para  3   Term      Preterm      AB      Living  2     SAB      IAB      Ectopic      Multiple      Live Births               Home Medications    Prior to Admission medications   Medication Sig Start Date End Date Taking? Authorizing Provider  atorvastatin (LIPITOR) 20 MG tablet TAKE 1 TABLET BY MOUTH EVERY DAY IN THE EVENING 02/18/20  Yes [name], MD   donepezil (ARICEPT) 10 MG tablet Take 1 tablet daily 04/18/20  Yes Cameron Sprang, MD  ELIQUIS 5 MG TABS tablet TAKE 1 TABLET BY MOUTH TWICE A DAY 02/13/20  Yes [name], MD  levothyroxine (SYNTHROID) 88 MCG tablet TAKE 1 TABLET BY MOUTH EVERY MORNING ON AN EMPTY STOMACH 11/06/19  Yes [name], MD  lisinopril (ZESTRIL) 20 MG tablet TAKE 1 TABLET BY MOUTH EVERY DAY 03/24/20  Yes [name], MD  oxybutynin (DITROPAN-XL) 5 MG 24 hr tablet TAKE 1 TABLET BY MOUTH EVERY DAY 08/28/19  Yes [name], MD  sertraline (ZOLOFT) 50 MG tablet Take 1 tablet (50 mg total) by mouth at bedtime. 05/08/20  Yes Cameron Sprang, MD  acetaminophen (TYLENOL) 500 MG tablet Take 1,000 mg by mouth every 8 (eight) hours as needed for moderate  pain or headache.     [provider]  Carboxymethylcell-Hypromellose (GENTEAL OP) Place 1 drop into both eyes daily as needed (dry eyes).    [provider]  cetirizine (ZYRTEC) 5 MG tablet TAKE 1 TABLET BY MOUTH EVERY DAY 04/18/20   [name], MD  Cholecalciferol (VITAMIN D) 50 MCG (2000 UT) tablet Take 1 tablet (2,000 Units total) by mouth daily. 06/25/19   [name], MD  Cyanocobalamin (B-12) 1000 MCG CAPS TAKE 1 CAPSULE BY MOUTH EVERY DAY 03/17/20   [name], MD  fluticasone [facility]) 50 MCG/ACT nasal spray Place 1 spray into both nostrils daily as needed for allergies or rhinitis. 12/18/19   [name], MD    Family History Family History  Problem Relation Age of Onset  . Diabetes Mother   . Mental illness Mother   . Diabetes Father   . Heart attack Father     Social History Social History   Tobacco Use  . Smoking status: Former Smoker    Packs/day: 0.25    Years: 10.00    Pack years: 2.50    Types: Cigarettes    Quit date: 1980    Years since quitting: 42.1  . Smokeless tobacco: Never Used  Vaping Use  . Vaping Use: Never used  Substance Use Topics  . Alcohol use: No  . Drug use: No     Allergies    Adhesive [tape] and Penicillins   Review of Systems Review of Systems  Constitutional: Positive for activity change, appetite change, chills and fatigue. Negative for fever.  HENT: Positive for hearing loss. Negative for congestion.   Eyes: Positive for visual disturbance.  Respiratory: Positive for shortness of breath. Negative for chest tightness and wheezing.   Gastrointestinal: Positive for abdominal distention, abdominal pain, constipation, diarrhea and nausea. Negative for vomiting.  Genitourinary: Positive for difficulty urinating, frequency and urgency. Negative for flank pain and hematuria.  Musculoskeletal: Positive for arthralgias, back pain and myalgias.  Skin: Negative for color change and rash.  Allergic/Immunologic: Positive for environmental allergies. Negative for food allergies.  Neurological: Positive for weakness and light-headedness. Negative for tremors, seizures, syncope, facial asymmetry and speech difficulty.  Hematological: Negative for adenopathy. Bruises/bleeds easily.  Psychiatric/Behavioral: Positive for sleep disturbance.     Physical Exam Triage Vital Signs ED Triage Vitals  Enc Vitals Group     BP 05/09/20 1307 (!) 163/88     Pulse Rate 05/09/20 1307 80     Resp 05/09/20 1307 20     Temp 05/09/20 1307 (!) 97.2 F (36.2 C)     Temp Source 05/09/20 1307 Oral     SpO2 05/09/20 1307 98 %     Weight --      Height --      Head Circumference --      Peak Flow --      Pain Score 05/09/20 1308 5     Pain Loc --      Pain Edu? --      Excl. in [place]? --    No data found.  Updated Vital Signs BP (!) 163/88 (BP Location: Right Arm)   Pulse 80   Temp (!) 97.2 F (36.2 C) (Oral)   Resp 20   SpO2 98%   Visual Acuity Right Eye Distance:   Left Eye Distance:   Bilateral Distance:    Right Eye Near:   Left Eye Near:    Bilateral Near:     Physical Exam  Vitals and nursing note reviewed.  Constitutional:      General: She is awake. She is not  in acute distress.    Appearance: She is well-developed and well-groomed.     Comments: She is sitting in the exam chair in no acute distress but appears tired. She is answering questions appropriate in complete sentences.   HENT:     Head: Normocephalic and atraumatic.     Right Ear: External ear normal.     Left Ear: External ear normal.     Mouth/Throat:     Mouth: Mucous membranes are moist.  Eyes:     Comments: Left ptosis present  Cardiovascular:     Rate and Rhythm: Normal rate.     Heart sounds: Normal heart sounds.  Pulmonary:     Effort: Pulmonary effort is normal. No respiratory distress.     Breath sounds: Normal breath sounds and air entry. No decreased air movement. No decreased breath sounds, wheezing or rhonchi.  Abdominal:     General: Bowel sounds are increased. There is no distension.     Palpations: Abdomen is soft. There is mass. There is no shifting dullness or splenomegaly.     Tenderness: There is abdominal tenderness in the epigastric area, periumbilical area and left upper quadrant. There is no right CVA tenderness or left CVA tenderness.       Comments: Tender especially over central epigastric area. Small about 1 1/2 cm round to oval mass present in central to just left of midline upper gastric area. Very tender. No distinct pulsations present. Also tender lower near umbilicus and left upper abdominal area. No rebound. No CVA tenderness.   Skin:    General: Skin is warm and dry.     Capillary Refill: Capillary refill takes less than 2 seconds.     Findings: No rash.  Neurological:     General: No focal deficit present.     Mental Status: She is alert and oriented to person, place, and time.     Motor: Motor function is intact.     Comments: Uses a walker for support and to assist with gait.   Psychiatric:        Attention and Perception: Attention normal.        Mood and Affect: Mood normal.        Speech: Speech normal.        Behavior: Behavior  normal. Behavior is cooperative.        Thought Content: Thought content normal.      UC Treatments / Results  Labs (all labs ordered are listed, but only abnormal results are displayed) Labs Reviewed  POCT URINALYSIS DIPSTICK, ED / UC - Abnormal; Notable for the following components:      Result Value   Urobilinogen, UA 2.0 (*)    All other components within normal limits    EKG   Radiology No results found.  Procedures Procedures (including critical care time)  Medications Ordered in UC Medications - No data to display  Initial Impression / Assessment and Plan / UC Course  I have reviewed the triage vital signs and the nursing notes.  Pertinent labs & imaging results that were available during my care of the patient were reviewed by me and considered in my medical decision making (see chart for details).    Reviewed urinalysis results with patient and caregiver. Negative for UTI. Discussed that with continued upper abdominal pain and mass in abdomen, needs more immediate work up  with probable CT scan and blood work to determine etiology of her symptoms. Patient is stable but needs more emergent care, especially with her cardiac history. Discussed that some possible etiologies can be life-threatening so recommend go to [facility] ER ASAP for further evaluation. Patient and Caregiver agree with plan and Caregiver will take patient to the ER now.  Final Clinical Impressions(s) / UC Diagnoses   Final diagnoses:  Abdominal pain, epigastric  Upper abdominal pain, unspecified  Flatulence/gas pain/belching     Discharge Instructions     Since you are having central upper abdominal pain and have a noticeable "bulge" in your abdomen, we are concerned over cause of your symptoms. We want you to go to the ER ASAP to rule out hernia, abdominal aortic aneurysm, abdominal mass or an infectious cause of your pain and symptoms. We want you to have a CT Scan or similar imaging and  labwork to help determine cause of your symptoms. Your urine does not show any signs of urinary tract infection. Recommend go to [facility] ER now for further evaluation.     ED Prescriptions    None     PDMP not reviewed this encounter.   Katy Apo, NP 05/09/20 1504

## 2020-05-09 NOTE — ED Notes (Signed)
Patient is being discharged from the Urgent Care and sent to the Emergency Department via POV. Per A. Amyot, NP, patient is in need of higher level of care due to abdominal abdominal pain, and to r/o aortic aneurysm vs, abdominal mass, hernia Patient is aware and verbalizes understanding of plan of care.  Vitals:   05/09/20 1307  BP: (!) 163/88  Pulse: 80  Resp: 20  Temp: (!) 97.2 F (36.2 C)  SpO2: 98%

## 2020-05-09 NOTE — ED Notes (Signed)
Patient left on own accord °
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
## 2020-05-09 NOTE — ED Triage Notes (Signed)
Pt arrives to ER with chief complaint of epigastric pain with belching and shortness of breath ongoing for 3 days.

## 2020-05-09 NOTE — Discharge Instructions (Addendum)
Since you are having central upper abdominal pain and have a noticeable "bulge" in your abdomen, we are concerned over cause of your symptoms. We want you to go to the ER ASAP to rule out hernia, abdominal aortic aneurysm, abdominal mass or an infectious cause of your pain and symptoms. We want you to have a CT Scan or similar imaging and labwork to help determine cause of your symptoms. Your urine does not show any signs of urinary tract infection. Recommend go to [facility] ER now for further evaluation.

## 2020-05-11 NOTE — Patient Instructions (Signed)
PESSARY CARE  You may take the pessary out once or twice a week at bedtime and leave it out overnight.   Clean the pessary with soap and water only.   If you develop green foul smelling discharge, leave the pessary out for 2 - 3 days.   Use KY jelly or any other personal lubricant that is water based to help with placement and removal.   Wear a sanitary pad until you are sure you understand if you have urinary incontinence with the pessary in place.   Wear biking type shorts with crotch support for exercise until you know that the pessary will not come out easily with maneuvers causing increases in abdominal pressure.  The pessary needs to be removed for sexual activity.   Look before you flush the toilet to be sure the pessary has not come out with straining on the toilet!  Please call for any pain or vaginal bleeding.   

## 2020-05-12 NOTE — Telephone Encounter (Signed)
Recommend increasing lisinopril to 30 mg due to home BP > 150/90 more than 50% of the time.   Lab Results  Component Value Date   CREATININE 1.15 (H) 05/09/2020   BUN 15 05/09/2020   GFR 38.04 (L) 04/10/2020   GFRNONAA 46 (L) 05/09/2020   GFRAA 49 (L) 02/13/2019   NA 132 (L) 05/09/2020   K 3.9 05/09/2020   CALCIUM 9.1 05/09/2020   CO2 26 05/09/2020     Debbora Dus, PharmD Clinical Pharmacist [facility] [phone]

## 2020-05-13 ENCOUNTER — Ambulatory Visit (INDEPENDENT_AMBULATORY_CARE_PROVIDER_SITE_OTHER): Payer: Medicare Other | Admitting: Family Medicine

## 2020-05-13 ENCOUNTER — Other Ambulatory Visit: Payer: Self-pay

## 2020-05-13 ENCOUNTER — Encounter: Payer: Self-pay | Admitting: Family Medicine

## 2020-05-13 VITALS — BP 150/90 | HR 42 | Temp 96.7°F | Wt 152.0 lb

## 2020-05-13 DIAGNOSIS — R109 Unspecified abdominal pain: Secondary | ICD-10-CM | POA: Diagnosis not present

## 2020-05-13 DIAGNOSIS — I10 Essential (primary) hypertension: Secondary | ICD-10-CM | POA: Diagnosis not present

## 2020-05-13 MED ORDER — LISINOPRIL 30 MG PO TABS: 30.0000 mg | ORAL_TABLET | Freq: Every day | ORAL | 3 refills | Status: DC

## 2020-05-13 NOTE — Telephone Encounter (Signed)
Appreciate pharmacy support. Pt seen today and dose increased

## 2020-05-13 NOTE — Progress Notes (Signed)
Subjective:     [name] is a 87 y.o. female presenting for Mass (X 1 month In abdomen. No pain. Has no appetite. Feels bloated.), Diarrhea (Massive incontinent episode Saturday. No BM since Saturday ), and Gas (Burping excessively x 1 month )     HPI    #Abdominal mass - went to the urgent care and was told to go to the ER - left without being seen - did not bring her medication or food so she left - waited 8-10 hours - sent to the ER for CT - urgent care on 2/18 - had a large BM on 2/19 and incontinence - all over the bathroom at her house - her abdominal pain improved afterwards - still having a knot near the belly pain location  Notes significant abdominal fullness and pressure making it difficult to take a deep breath when laying flat  No PND No leg edema   Review of Systems  Constitutional: Negative for chills and fever.  Gastrointestinal: Positive for abdominal pain, constipation and diarrhea. Negative for blood in stool, nausea and vomiting.    05/09/20: UC - stomach pain - advised to go to the ER for imaging but she left w/o being seen   Review of Systems   Social History   Tobacco Use  Smoking Status Former Smoker  . Packs/day: 0.25  . Years: 10.00  . Pack years: 2.50  . Types: Cigarettes  . Quit date: 71  . Years since quitting: 42.1  Smokeless Tobacco Never Used        Objective:    BP Readings from Last 3 Encounters:  05/13/20 (!) 150/90  05/09/20 (!) 166/106  05/09/20 (!) 163/88   Wt Readings from Last 3 Encounters:  05/13/20 152 lb (68.9 kg)  05/07/20 148 lb (67.1 kg)  04/30/20 148 lb (67.1 kg)    BP (!) 150/90   Pulse (!) 42   Temp (!) 96.7 F (35.9 C) (Temporal)   Wt 152 lb (68.9 kg)   SpO2 100%   BMI 28.72 kg/m    Physical Exam Constitutional:      General: She is not in acute distress.    Appearance: She is well-developed. She is not diaphoretic.  HENT:     Right Ear: External ear normal.      Left Ear: External ear normal.     Nose: Nose normal.  Eyes:     Conjunctiva/sclera: Conjunctivae normal.  Cardiovascular:     Rate and Rhythm: Normal rate.  Pulmonary:     Effort: Pulmonary effort is normal. No respiratory distress.     Breath sounds: No wheezing or rhonchi.  Abdominal:     General: Abdomen is flat. Bowel sounds are normal. There is no distension.     Palpations: Abdomen is soft. There is mass (small mobile subdermal mass near the umbilicus ).     Tenderness: There is abdominal tenderness (LUQ and LLQ). There is guarding. There is no rebound.     Hernia: No hernia is present.  Musculoskeletal:     Cervical back: Neck supple.  Skin:    General: Skin is warm and dry.     Capillary Refill: Capillary refill takes less than 2 seconds.  Neurological:     Mental Status: She is alert. Mental status is at baseline.  Psychiatric:        Mood and Affect: Mood normal.        Behavior: Behavior normal.  Assessment & Plan:   Problem List Items Addressed This Visit      Cardiovascular and Mediastinum   Malignant hypertension    BP elevated. Advised increasing lisinopril 20>30 mg.       Relevant Medications   lisinopril (ZESTRIL) 30 MG tablet     Other   Left sided abdominal pain - Primary    Etiology unclear. Improved after large BM so suspect constipation given >1 month duration. Encouraged hydration and stool softener regimen. She had reassuring blood work but given persistence and prior advice and ttp will order CT to rule out more serious cause. Call if worsening and can switch to urgent, but overall reassuring given duration of pain and intermittent nature. Return in 2 weeks for f/u if symptoms do not resolve.       Relevant Orders   CT Abdomen Pelvis W Contrast       Return in about 2 weeks (around 05/27/2020) for breathing, stomach pain.  [name], MD  This visit occurred during the SARS-CoV-2 public health emergency.  Safety protocols  were in place, including screening questions prior to the visit, additional usage of staff PPE, and extensive cleaning of exam room while observing appropriate contact time as indicated for disinfecting solutions.

## 2020-05-13 NOTE — Assessment & Plan Note (Addendum)
Etiology unclear. Improved after large BM so suspect constipation given >1 month duration. Encouraged hydration and stool softener regimen. She had reassuring blood work but given persistence and prior advice and ttp will order CT to rule out more serious cause. Call if worsening and can switch to urgent, but overall reassuring given duration of pain and intermittent nature. Return in 2 weeks for f/u if symptoms do not resolve.

## 2020-05-13 NOTE — Patient Instructions (Addendum)
#   High Blood pressure - Increase lisinopril to 30 mg - Make sure you are staying hydrated to help with dizziness   # Abdominal pain - CT scan to look for serious causes - Restart stool softener and prune juice  - to see if it helps with the breathing

## 2020-05-13 NOTE — Assessment & Plan Note (Signed)
BP elevated. Advised increasing lisinopril 20>30 mg.

## 2020-05-14 ENCOUNTER — Ambulatory Visit: Payer: Medicare Other | Admitting: Obstetrics and Gynecology

## 2020-05-14 DIAGNOSIS — Z961 Presence of intraocular lens: Secondary | ICD-10-CM | POA: Diagnosis not present

## 2020-05-14 DIAGNOSIS — H35363 Drusen (degenerative) of macula, bilateral: Secondary | ICD-10-CM | POA: Diagnosis not present

## 2020-05-14 DIAGNOSIS — H34831 Tributary (branch) retinal vein occlusion, right eye, with macular edema: Secondary | ICD-10-CM | POA: Diagnosis not present

## 2020-05-14 DIAGNOSIS — H02821 Cysts of right upper eyelid: Secondary | ICD-10-CM | POA: Diagnosis not present

## 2020-05-14 DIAGNOSIS — H35373 Puckering of macula, bilateral: Secondary | ICD-10-CM | POA: Diagnosis not present

## 2020-05-14 DIAGNOSIS — H01001 Unspecified blepharitis right upper eyelid: Secondary | ICD-10-CM | POA: Diagnosis not present

## 2020-05-16 ENCOUNTER — Ambulatory Visit (INDEPENDENT_AMBULATORY_CARE_PROVIDER_SITE_OTHER): Payer: Medicare Other

## 2020-05-16 DIAGNOSIS — I495 Sick sinus syndrome: Secondary | ICD-10-CM

## 2020-05-18 ENCOUNTER — Other Ambulatory Visit: Payer: Self-pay | Admitting: Family Medicine

## 2020-05-18 DIAGNOSIS — R0981 Nasal congestion: Secondary | ICD-10-CM

## 2020-05-19 LAB — CUP PACEART REMOTE DEVICE CHECK
Date Time Interrogation Session: 20220226075709
Implantable Lead Implant Date: 20201214
Implantable Lead Location: 753860
Implantable Lead Model: 1948
Implantable Pulse Generator Implant Date: 20201214

## 2020-05-20 NOTE — Telephone Encounter (Signed)
Pharmacy requests refill MS:[id number] 50 mcg/act Nasal spray    LAST REFILL: 12/18/2019 (Q-9.9 mL, R-2) LAST OV: 05/13/2020 NEXT OV: 05/27/2020 PHARMACY: [facility] [place], [place] requests refill on: Eliquis 5 mg   LAST REFILL: 02/13/2020 (Q-60, R-2) LAST OV: 05/13/2020 NEXT OV: 05/27/2020 PHARMACY: [facility] [place], [place] requests refill on: Oxybutynin 5 mg 24 hr   LAST REFILL: 08/28/2019 (Q-90, R-2)  LAST OV: 05/13/2020 NEXT OV: 05/27/2020 PHARMACY: [id number] [place], [place]  Earliest Fill Date: 05/27/2020

## 2020-05-23 NOTE — Progress Notes (Signed)
Remote pacemaker transmission.   
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
## 2020-05-27 ENCOUNTER — Ambulatory Visit (INDEPENDENT_AMBULATORY_CARE_PROVIDER_SITE_OTHER): Payer: Medicare Other | Admitting: Physician Assistant

## 2020-05-27 ENCOUNTER — Other Ambulatory Visit: Payer: Self-pay

## 2020-05-27 ENCOUNTER — Encounter: Payer: Self-pay | Admitting: Physician Assistant

## 2020-05-27 ENCOUNTER — Ambulatory Visit: Payer: Medicare Other | Admitting: Family Medicine

## 2020-05-27 VITALS — BP 130/78 | HR 82 | Ht 61.0 in | Wt 151.0 lb

## 2020-05-27 DIAGNOSIS — I10 Essential (primary) hypertension: Secondary | ICD-10-CM

## 2020-05-27 DIAGNOSIS — I4821 Permanent atrial fibrillation: Secondary | ICD-10-CM | POA: Diagnosis not present

## 2020-05-27 DIAGNOSIS — I350 Nonrheumatic aortic (valve) stenosis: Secondary | ICD-10-CM

## 2020-05-27 DIAGNOSIS — Z950 Presence of cardiac pacemaker: Secondary | ICD-10-CM

## 2020-05-27 DIAGNOSIS — Z0289 Encounter for other administrative examinations: Secondary | ICD-10-CM

## 2020-05-27 LAB — CUP PACEART INCLINIC DEVICE CHECK
Battery Remaining Longevity: 145 mo
Battery Voltage: 3.06 V
Brady Statistic RV Percent Paced: 57.95 %
Date Time Interrogation Session: 20220308121633
Implantable Lead Implant Date: 20201214
Implantable Lead Location: 753860
Implantable Lead Model: 1948
Implantable Pulse Generator Implant Date: 20201214
Lead Channel Impedance Value: 437 Ohm
Lead Channel Impedance Value: 608 Ohm
Lead Channel Pacing Threshold Amplitude: 0.75 V
Lead Channel Pacing Threshold Pulse Width: 0.4 ms
Lead Channel Sensing Intrinsic Amplitude: 6.125 mV
Lead Channel Sensing Intrinsic Amplitude: 6.375 mV
Lead Channel Setting Pacing Amplitude: 2.5 V
Lead Channel Setting Pacing Pulse Width: 0.4 ms
Lead Channel Setting Sensing Sensitivity: 1.2 mV

## 2020-05-27 NOTE — Patient Instructions (Signed)
Medication Instructions:   Your physician recommends that you continue on your current medications as directed. Please refer to the Current Medication list given to you today.\ *If you need a refill on your cardiac medications before your next appointment, please call your pharmacy*   Lab Work: NONE ORDERED  TODAY   If you have labs (blood work) drawn today and your tests are completely normal, you will receive your results only by: Marland Kitchen MyChart Message (if you have MyChart) OR . A paper copy in the mail If you have any lab test that is abnormal or we need to change your treatment, we will call you to review the results.   Testing/Procedures:  IN 3 MONTHS Your physician has requested that you have an echocardiogram. Echocardiography is a painless test that uses sound waves to create images of your heart. It provides your doctor with information about the size and shape of your heart and how well your heart's chambers and valves are working. This procedure takes approximately one hour. There are no restrictions for this procedure.   Follow-Up: At [facility], you and your health needs are our priority.  As part of our continuing mission to provide you with exceptional heart care, we have created designated Provider Care Teams.  These Care Teams include your primary Cardiologist (physician) and Advanced Practice Providers (APPs -  Physician Assistants and Nurse Practitioners) who all work together to provide you with the care you need, when you need it.  We recommend signing up for the patient portal called "MyChart".  Sign up information is provided on this After Visit Summary.  MyChart is used to connect with patients for Virtual Visits (Telemedicine).  Patients are able to view lab/test results, encounter notes, upcoming appointments, etc.  Non-urgent messages can be sent to your provider as well.   To learn more about what you can do with MyChart, go to [email or web address].    Your next  appointment:   3 month(s) after Echo   The format for your next appointment:   In Person  Provider:   You may see Dr. Caryl Comes  or one of the following Advanced Practice Providers on your designated Care Team:    Chanetta Marshall, NP  Tommye Standard, PA-C  Legrand Como "Oda Kilts, [place]    Other Instructions

## 2020-05-30 ENCOUNTER — Ambulatory Visit
Admission: RE | Admit: 2020-05-30 | Discharge: 2020-05-30 | Disposition: A | Discharge status: Home / Self Care | Payer: Medicare Other | Source: Ambulatory Visit | Attending: Family Medicine | Admitting: Family Medicine

## 2020-05-30 ENCOUNTER — Other Ambulatory Visit: Payer: Self-pay

## 2020-05-30 DIAGNOSIS — E278 Other specified disorders of adrenal gland: Secondary | ICD-10-CM | POA: Diagnosis not present

## 2020-05-30 DIAGNOSIS — R112 Nausea with vomiting, unspecified: Secondary | ICD-10-CM | POA: Diagnosis not present

## 2020-05-30 DIAGNOSIS — R109 Unspecified abdominal pain: Secondary | ICD-10-CM

## 2020-05-30 DIAGNOSIS — M419 Scoliosis, unspecified: Secondary | ICD-10-CM | POA: Diagnosis not present

## 2020-05-30 DIAGNOSIS — N200 Calculus of kidney: Secondary | ICD-10-CM | POA: Diagnosis not present

## 2020-05-30 MED ORDER — IOPAMIDOL (ISOVUE-300) INJECTION 61%
100.0000 mL | Freq: Once | INTRAVENOUS | Status: AC | PRN
  Administered 2020-05-30: 100 mL via INTRAVENOUS

## 2020-06-03 ENCOUNTER — Telehealth: Payer: Self-pay

## 2020-06-03 ENCOUNTER — Other Ambulatory Visit: Payer: Self-pay

## 2020-06-03 DIAGNOSIS — I5189 Other ill-defined heart diseases: Secondary | ICD-10-CM

## 2020-06-03 DIAGNOSIS — I482 Chronic atrial fibrillation, unspecified: Secondary | ICD-10-CM

## 2020-06-03 DIAGNOSIS — K761 Chronic passive congestion of liver: Secondary | ICD-10-CM

## 2020-06-03 NOTE — Addendum Note (Signed)
Addended by: Thora Lance on: 06/03/2020 12:08 PM   Modules accepted: Orders

## 2020-06-03 NOTE — Telephone Encounter (Signed)
Spoke with pt and advised per Tommye Standard, PA-C, follow appointment scheduled for 06/12/2020 with echo scheduled for 06/27/2020.  Pt verbalizes understanding and thanked Therapist, sports for the call.

## 2020-06-03 NOTE — Progress Notes (Signed)
Per Tommye Standard, PA-C pt needs updated echo. Order placed.

## 2020-06-05 ENCOUNTER — Encounter: Payer: Self-pay | Admitting: Family Medicine

## 2020-06-05 ENCOUNTER — Ambulatory Visit (INDEPENDENT_AMBULATORY_CARE_PROVIDER_SITE_OTHER): Payer: Medicare Other | Admitting: Family Medicine

## 2020-06-05 ENCOUNTER — Other Ambulatory Visit: Payer: Self-pay

## 2020-06-05 ENCOUNTER — Other Ambulatory Visit: Payer: Self-pay | Admitting: Family Medicine

## 2020-06-05 VITALS — BP 134/80 | HR 84 | Temp 97.0°F | Wt 150.0 lb

## 2020-06-05 DIAGNOSIS — H02403 Unspecified ptosis of bilateral eyelids: Secondary | ICD-10-CM | POA: Diagnosis not present

## 2020-06-05 DIAGNOSIS — F0390 Unspecified dementia without behavioral disturbance: Secondary | ICD-10-CM | POA: Diagnosis not present

## 2020-06-05 DIAGNOSIS — I509 Heart failure, unspecified: Secondary | ICD-10-CM

## 2020-06-05 DIAGNOSIS — R109 Unspecified abdominal pain: Secondary | ICD-10-CM

## 2020-06-05 MED ORDER — FUROSEMIDE 20 MG PO TABS: 20.0000 mg | ORAL_TABLET | Freq: Every day | ORAL | 0 refills | Status: DC

## 2020-06-05 NOTE — Assessment & Plan Note (Signed)
Has complained about this at every visit as disrupting to her vision. Was told not a surgical candidate for eyelift but would like a second opinion

## 2020-06-05 NOTE — Assessment & Plan Note (Signed)
Discussed recent CT scan with liver congestion. Pt endorses weight gain, leg swelling, and nighttime cough. Advised starting lasix and obtaining daily weights. Hold lisinopril or lasix if worsening dizziness or low bp. Return Monday for labs and visit

## 2020-06-05 NOTE — Telephone Encounter (Signed)
Pharmacy requests refill on: Oxybutynin 5 mg 24 hr   LAST REFILL: 08/28/2019 (Q-90, R-2) LAST OV: 06/05/2020 NEXT OV: 07/15/2020 PHARMACY: [id number] [place], [place]

## 2020-06-05 NOTE — Progress Notes (Signed)
Subjective:     [name] is a 87 y.o. female presenting for Review CT     HPI  #Eye - saw a specialist - said nothing to do - difficulty opening the eyes - feels like "head is full of something" - cannot see - notes issues with focusing and paying attention  #Abdominal pain - ct with  - states she used to be on lasix - does not recall why she stopped - legs are more swollen today than before  Notes she was 136 lbs a few months ago  Review of Systems   Social History   Tobacco Use  Smoking Status Former Smoker  . Packs/day: 0.25  . Years: 10.00  . Pack years: 2.50  . Types: Cigarettes  . Quit date: 72  . Years since quitting: 42.2  Smokeless Tobacco Never Used        Objective:    BP Readings from Last 3 Encounters:  06/05/20 134/80  05/27/20 130/78  05/13/20 (!) 150/90   Wt Readings from Last 3 Encounters:  06/05/20 150 lb (68 kg)  05/27/20 151 lb (68.5 kg)  05/13/20 152 lb (68.9 kg)    BP 134/80 (BP Location: Left Arm, Patient Position: Sitting, Cuff Size: Normal)   Pulse 84   Temp (!) 97 F (36.1 C) (Temporal)   Wt 150 lb (68 kg)   SpO2 94%   BMI 28.34 kg/m    Physical Exam Constitutional:      General: She is not in acute distress.    Appearance: She is well-developed. She is not diaphoretic.  HENT:     Right Ear: External ear normal.     Left Ear: External ear normal.  Eyes:     Conjunctiva/sclera: Conjunctivae normal.  Cardiovascular:     Rate and Rhythm: Normal rate and regular rhythm.     Heart sounds: Murmur heard.    Pulmonary:     Effort: Pulmonary effort is normal.     Breath sounds: Rales present.  Musculoskeletal:     Cervical back: Neck supple.     Right lower leg: Edema (1+ to mid calf, severe varicose veins) present.     Left lower leg: Edema (1+ to knee, slightly worse than right) present.  Skin:    General: Skin is warm and dry.     Capillary Refill: Capillary refill takes less than 2 seconds.   Neurological:     Mental Status: She is alert. Mental status is at baseline.  Psychiatric:        Mood and Affect: Mood normal.        Behavior: Behavior normal.           Assessment & Plan:   Problem List Items Addressed This Visit      Cardiovascular and Mediastinum   CHF (congestive heart failure) ([place]) - Primary    Discussed recent CT scan with liver congestion. Pt endorses weight gain, leg swelling, and nighttime cough. Advised starting lasix and obtaining daily weights. Hold lisinopril or lasix if worsening dizziness or low bp. Return Monday for labs and visit      Relevant Medications   furosemide (LASIX) 20 MG tablet     Nervous and Auditory   Dementia without behavioral disturbance ([place])    Pt continues to bring up issues with focusing and attention during the visit. Discussed dementia and advised neurology f/u with Dr. Delice Lesch. This does seem to be worsening, but pt also requesting to be off all  medications.         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 Was told not a surgical candidate for eyelift but would like a second opinion      Relevant Orders   Ambulatory referral to Ophthalmology   Left sided abdominal pain    Continues to complain of knot. Nothing on CT will reassess after diuresis if symptoms persisting          Return for return monday at 11:40 for f/u .  [name], MD  This visit occurred during the SARS-CoV-2 public health emergency.  Safety protocols were in place, including screening questions prior to the visit, additional usage of staff PPE, and extensive cleaning of exam room while observing appropriate contact time as indicated for disinfecting solutions.

## 2020-06-05 NOTE — Assessment & Plan Note (Signed)
Pt continues to bring up issues with focusing and attention during the visit. Discussed dementia and advised neurology f/u with Dr. Delice Lesch. This does seem to be worsening, but pt also requesting to be off all medications.

## 2020-06-05 NOTE — Assessment & Plan Note (Signed)
Continues to complain of knot. Nothing on CT will reassess after diuresis if symptoms persisting

## 2020-06-05 NOTE — Patient Instructions (Addendum)
You are going to start a fluid pill called Lasix  Heart failure 1) Weigh yourself daily -- Call if you gain or lose more than 3 lbs in one day 2) Start Lasix 20 mg tomorrow - if lightheaded and low blood pressure hold lisinopril or lasix  3) Return on Monday for blood work  This is what I hope will improve with the new medication 1) Stomach pain 2) Swelling in your legs 3) Coughing through the nighttime 4) "Fluid sensation of head"   Focus and Attention  - recommend calling Dr. Amparo Bristol office with [facility]  Eye lid issue - referral for a second opinion

## 2020-06-09 ENCOUNTER — Encounter: Payer: Self-pay | Admitting: Family Medicine

## 2020-06-09 ENCOUNTER — Telehealth: Payer: Self-pay

## 2020-06-09 ENCOUNTER — Other Ambulatory Visit: Payer: Self-pay

## 2020-06-09 ENCOUNTER — Ambulatory Visit (INDEPENDENT_AMBULATORY_CARE_PROVIDER_SITE_OTHER): Payer: Medicare Other | Admitting: Family Medicine

## 2020-06-09 VITALS — BP 136/82 | Temp 97.6°F | Ht 61.0 in | Wt 150.0 lb

## 2020-06-09 DIAGNOSIS — F0390 Unspecified dementia without behavioral disturbance: Secondary | ICD-10-CM | POA: Diagnosis not present

## 2020-06-09 DIAGNOSIS — I509 Heart failure, unspecified: Secondary | ICD-10-CM | POA: Diagnosis not present

## 2020-06-09 DIAGNOSIS — H02403 Unspecified ptosis of bilateral eyelids: Secondary | ICD-10-CM | POA: Diagnosis not present

## 2020-06-09 LAB — BASIC METABOLIC PANEL
BUN: 16 mg/dL (ref 6–23)
CO2: 32 mEq/L (ref 19–32)
Calcium: 9.3 mg/dL (ref 8.4–10.5)
Chloride: 97 mEq/L (ref 96–112)
Creatinine, Ser: 1.23 mg/dL — ABNORMAL HIGH (ref 0.40–1.20)
GFR: 39.49 mL/min — ABNORMAL LOW (ref >60.00)
Glucose, Bld: 117 mg/dL — ABNORMAL HIGH (ref 70–99)
Potassium: 3.7 mEq/L (ref 3.5–5.1)
Sodium: 137 mEq/L (ref 135–145)

## 2020-06-09 LAB — MAGNESIUM: Magnesium: 1.9 mg/dL (ref 1.5–2.5)

## 2020-06-09 NOTE — Telephone Encounter (Signed)
As patient and aid were leaving appointment aid requested that you call patient daughter to review appointment today. Informed aid that I am not sure of your procedure but someone will be in touch with her to review questions.

## 2020-06-09 NOTE — Assessment & Plan Note (Signed)
Pt ultimately declined second opinion. I suspect a lot of her issues are more related to her memory and she is just fixated on blaming the eyelid issue. Advised neurology f/u and if no improvement she will reach out if wanting to pursue eyelid second opinion though this is likely a longshot due to age and comorbidity.

## 2020-06-09 NOTE — Assessment & Plan Note (Signed)
Weight is stable. Swelling does seems slightly reduced. Advised continuing lasix 20 mg daily. Will check electrolytes today. Seeing cardiology on 3/24.
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
## 2020-06-09 NOTE — Progress Notes (Signed)
Subjective:     [name] is a 87 y.o. female presenting for Congestive Heart Failure (Has not noticed any change in symptoms. )     HPI  #CHF - has noticed more urination - has not noticed improvement in swelling - no weight change - took medication Friday, Saturday, Monday - notes dark water - does drink well - hx of so many surgeries on the legs - using voltaren gel   #eye dropping - canceled her 2nd opinion referral  #focusing issues - again concerned about focusing - forgets things during the day but they come back a nighttime - felt things got worse on higher doses - f/u with neurology not until august  Review of Systems   06/05/20: Clinic - CHF - start lasix 20 mg. Check weights, return Monday  Social History   Tobacco Use  Smoking Status Former Smoker  . Packs/day: 0.25  . Years: 10.00  . Pack years: 2.50  . Types: Cigarettes  . Quit date: 69  . Years since quitting: 42.2  Smokeless Tobacco Never Used        Objective:    BP Readings from Last 3 Encounters:  06/09/20 136/82  06/05/20 134/80  05/27/20 130/78   Wt Readings from Last 3 Encounters:  06/09/20 150 lb (68 kg)  06/05/20 150 lb (68 kg)  05/27/20 151 lb (68.5 kg)    BP 136/82   Temp 97.6 F (36.4 C) (Temporal)   Ht 5\' 1"  (1.549 m)   Wt 150 lb (68 kg)   SpO2 97%   BMI 28.34 kg/m    Physical Exam Constitutional:      General: She is not in acute distress.    Appearance: She is well-developed. She is not diaphoretic.  HENT:     Right Ear: External ear normal.     Left Ear: External ear normal.     Nose: Nose normal.  Eyes:     Conjunctiva/sclera: Conjunctivae normal.  Cardiovascular:     Rate and Rhythm: Normal rate.     Heart sounds: Murmur heard.    Pulmonary:     Effort: Pulmonary effort is normal.     Breath sounds: Rales (faint bibasilar ) present.  Musculoskeletal:     Cervical back: Neck supple.     Right lower leg: Edema present.     Left  lower leg: Edema present.     Comments: 1+ on the left. Trace on the right  Skin:    General: Skin is warm and dry.     Capillary Refill: Capillary refill takes less than 2 seconds.  Neurological:     Mental Status: She is alert. Mental status is at baseline.  Psychiatric:        Mood and Affect: Mood normal.        Behavior: Behavior normal.           Assessment & Plan:   Problem List Items Addressed This Visit      Cardiovascular and Mediastinum   CHF (congestive heart failure) ([place]) - Primary    Weight is stable. Swelling does seems slightly reduced. Advised continuing lasix 20 mg daily. Will check electrolytes today. Seeing cardiology on 3/24.       Relevant Orders   Basic metabolic panel   Magnesium     Nervous and Auditory   Dementia without behavioral disturbance ([place])    Long discussion about her concerns for focus - which she often draws back to her eye  lid dropping but also notes memory and attention difficulty. Strongly advised calling neurology Dr. Delice Lesch for earlier f/u appt. Memory and attention seem to be getting worse as she frequently is going off on the same tangents during our visit. Appreciate neuro support. 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 I suspect a lot of her issues are more related to her memory and she is just fixated on blaming the eyelid issue. Advised neurology f/u and if no improvement she will reach out if wanting to pursue eyelid second opinion though this is likely a longshot due to age and comorbidity.           Return in about 4 weeks (around 07/07/2020).  [name], MD  This visit occurred during the SARS-CoV-2 public health emergency.  Safety protocols were in place, including screening questions prior to the visit, additional usage of staff PPE, and extensive cleaning of exam room while observing appropriate contact time as indicated  for disinfecting solutions.

## 2020-06-09 NOTE — Assessment & Plan Note (Signed)
Long discussion about her concerns for focus - which she often draws back to her eye lid dropping but also notes memory and attention difficulty. Strongly advised calling neurology Dr. Delice Lesch for earlier f/u appt. Memory and attention seem to be getting worse as she frequently is going off on the same tangents during our visit. Appreciate neuro support. Cont sertraline and donepezil

## 2020-06-09 NOTE — Patient Instructions (Addendum)
#  Focus issues - Please call Dr. Amparo Bristol office if you are noticing more issues with memory and attention - Call Dr. Amparo Bristol office Address: [address], [place], [place] [address] Phone: [phone] - Medications: Donezepril and Sertraline -- are the medications that are meant to help with this  #Call if you decide you want a second opinion about your eye lids  Continue taking lasix  Labs today  See cardiology on 06/12/2020

## 2020-06-09 NOTE — Telephone Encounter (Signed)
Called daughter to update on Becky's health and current plan.   Advised calling and scheduling earlier appt with neurology.   Daughter is looking into assisted living but [name] does not want to go.

## 2020-06-12 ENCOUNTER — Encounter: Payer: Self-pay | Admitting: Physician Assistant

## 2020-06-12 ENCOUNTER — Ambulatory Visit (INDEPENDENT_AMBULATORY_CARE_PROVIDER_SITE_OTHER): Payer: Medicare Other | Admitting: Physician Assistant

## 2020-06-12 ENCOUNTER — Other Ambulatory Visit: Payer: Self-pay

## 2020-06-12 VITALS — BP 142/78 | HR 82 | Ht 61.0 in | Wt 148.8 lb

## 2020-06-12 DIAGNOSIS — Z950 Presence of cardiac pacemaker: Secondary | ICD-10-CM | POA: Diagnosis not present

## 2020-06-12 DIAGNOSIS — I350 Nonrheumatic aortic (valve) stenosis: Secondary | ICD-10-CM | POA: Diagnosis not present

## 2020-06-12 DIAGNOSIS — I10 Essential (primary) hypertension: Secondary | ICD-10-CM

## 2020-06-12 DIAGNOSIS — R42 Dizziness and giddiness: Secondary | ICD-10-CM | POA: Diagnosis not present

## 2020-06-12 DIAGNOSIS — I4821 Permanent atrial fibrillation: Secondary | ICD-10-CM | POA: Diagnosis not present

## 2020-06-12 LAB — CUP PACEART INCLINIC DEVICE CHECK
Battery Remaining Longevity: 144 mo
Battery Voltage: 3.06 V
Brady Statistic RV Percent Paced: 56.65 %
Date Time Interrogation Session: 20220324151702
Implantable Lead Implant Date: 20201214
Implantable Lead Location: 753860
Implantable Lead Model: 1948
Implantable Pulse Generator Implant Date: 20201214
Lead Channel Impedance Value: 437 Ohm
Lead Channel Impedance Value: 589 Ohm
Lead Channel Pacing Threshold Amplitude: 0.875 V
Lead Channel Pacing Threshold Pulse Width: 0.4 ms
Lead Channel Sensing Intrinsic Amplitude: 6 mV
Lead Channel Sensing Intrinsic Amplitude: 6.625 mV
Lead Channel Setting Pacing Amplitude: 2.5 V
Lead Channel Setting Pacing Pulse Width: 0.4 ms
Lead Channel Setting Sensing Sensitivity: 1.2 mV

## 2020-06-12 NOTE — Patient Instructions (Addendum)
Medication Instructions:   PLEASE MAKE SURE YOU TAKE FUROSEMIDE IN THE AM    *If you need a refill on your cardiac medications before your next appointment, please call your pharmacy*   Lab Work:  NONE ORDERED  TODAY   If you have labs (blood work) drawn today and your tests are completely normal, you will receive your results only by: Marland Kitchen MyChart Message (if you have MyChart) OR . A paper copy in the mail If you have any lab test that is abnormal or we need to change your treatment, we will call you to review the results.   Testing/Procedures: CHECK TO SEE IF ECHOCARDIOGRAM CAN BE DONE SOONER OR IF AN EARLIER CANCELLATION   Follow-Up: At [facility], you and your health needs are our priority.  As part of our continuing mission to provide you with exceptional heart care, we have created designated Provider Care Teams.  These Care Teams include your primary Cardiologist (physician) and Advanced Practice Providers (APPs -  Physician Assistants and Nurse Practitioners) who all work together to provide you with the care you need, when you need it.  We recommend signing up for the patient portal called "MyChart".  Sign up information is provided on this After Visit Summary.  MyChart is used to connect with patients for Virtual Visits (Telemedicine).  Patients are able to view lab/test results, encounter notes, upcoming appointments, etc.  Non-urgent messages can be sent to your provider as well.   To learn more about what you can do with MyChart, go to [email or web address].    Your next appointment:    6 week(s) after echo   The format for your next appointment:   In Person  Provider:   Virl Axe, MD   Other Instructions

## 2020-06-17 ENCOUNTER — Other Ambulatory Visit: Payer: Self-pay | Admitting: Family Medicine

## 2020-06-17 DIAGNOSIS — I509 Heart failure, unspecified: Secondary | ICD-10-CM

## 2020-06-22 ENCOUNTER — Other Ambulatory Visit: Payer: Self-pay | Admitting: Family Medicine

## 2020-06-22 DIAGNOSIS — I10 Essential (primary) hypertension: Secondary | ICD-10-CM

## 2020-06-27 ENCOUNTER — Other Ambulatory Visit (HOSPITAL_COMMUNITY): Payer: Medicare Other

## 2020-06-30 ENCOUNTER — Other Ambulatory Visit: Payer: Self-pay | Admitting: Family Medicine

## 2020-06-30 DIAGNOSIS — I10 Essential (primary) hypertension: Secondary | ICD-10-CM

## 2020-07-01 ENCOUNTER — Other Ambulatory Visit: Payer: Self-pay | Admitting: Family Medicine

## 2020-07-01 DIAGNOSIS — I10 Essential (primary) hypertension: Secondary | ICD-10-CM

## 2020-07-07 ENCOUNTER — Other Ambulatory Visit: Payer: Self-pay | Admitting: Family Medicine

## 2020-07-07 DIAGNOSIS — M8580 Other specified disorders of bone density and structure, unspecified site: Secondary | ICD-10-CM

## 2020-07-09 ENCOUNTER — Ambulatory Visit: Payer: Medicare Other | Admitting: Family Medicine

## 2020-07-09 ENCOUNTER — Other Ambulatory Visit: Payer: Self-pay | Admitting: Family Medicine

## 2020-07-09 DIAGNOSIS — M8580 Other specified disorders of bone density and structure, unspecified site: Secondary | ICD-10-CM

## 2020-07-11 ENCOUNTER — Other Ambulatory Visit: Payer: Self-pay

## 2020-07-11 ENCOUNTER — Ambulatory Visit (HOSPITAL_COMMUNITY): Payer: Medicare Other | Attending: Physician Assistant

## 2020-07-11 DIAGNOSIS — I350 Nonrheumatic aortic (valve) stenosis: Secondary | ICD-10-CM | POA: Diagnosis not present

## 2020-07-11 LAB — ECHOCARDIOGRAM COMPLETE
AR max vel: 0.69 cm2
AV Area VTI: 0.7 cm2
AV Area mean vel: 0.72 cm2
AV Mean grad: 20.4 mmHg
AV Peak grad: 38 mmHg
Ao pk vel: 3.08 m/s
Area-P 1/2: 3.73 cm2
S' Lateral: 3.2 cm

## 2020-07-15 ENCOUNTER — Ambulatory Visit (INDEPENDENT_AMBULATORY_CARE_PROVIDER_SITE_OTHER): Payer: Medicare Other | Admitting: Family Medicine

## 2020-07-15 ENCOUNTER — Other Ambulatory Visit: Payer: Self-pay

## 2020-07-15 ENCOUNTER — Encounter: Payer: Self-pay | Admitting: Family Medicine

## 2020-07-15 VITALS — BP 148/90 | HR 79 | Temp 97.3°F | Ht 61.0 in | Wt 150.8 lb

## 2020-07-15 DIAGNOSIS — F0390 Unspecified dementia without behavioral disturbance: Secondary | ICD-10-CM

## 2020-07-15 DIAGNOSIS — G5601 Carpal tunnel syndrome, right upper limb: Secondary | ICD-10-CM | POA: Diagnosis not present

## 2020-07-15 DIAGNOSIS — I10 Essential (primary) hypertension: Secondary | ICD-10-CM

## 2020-07-15 DIAGNOSIS — I350 Nonrheumatic aortic (valve) stenosis: Secondary | ICD-10-CM | POA: Diagnosis not present

## 2020-07-15 DIAGNOSIS — H02403 Unspecified ptosis of bilateral eyelids: Secondary | ICD-10-CM | POA: Diagnosis not present

## 2020-07-15 DIAGNOSIS — R0981 Nasal congestion: Secondary | ICD-10-CM | POA: Diagnosis not present

## 2020-07-15 NOTE — Assessment & Plan Note (Signed)
BP elevated today. Will monitoring. Cont lisinopril 30 mg and furosemide 20 mg

## 2020-07-15 NOTE — Assessment & Plan Note (Signed)
Persistent symptoms. She was told she needed surgery. Discussed waiting until after conversation with the heart specialist as would likely want to get cardiac risks reduced prior to pursuing any treatment.

## 2020-07-15 NOTE — Assessment & Plan Note (Signed)
MOCA 15/30 today. Family noticing worsening. Not driving and caregivers almost 24/7 at the house. Encouraged earlier f/u with Dr. Delice Lesch, they will call to schedule after her cardiology f/u. Discussed progression and benefit of neurology support. Cont donepezil 10 mg and zoloft 50 mg.

## 2020-07-15 NOTE — Assessment & Plan Note (Signed)
Chronic issue. Has seen ENT. Encouraged continued zyrtec, flonase, and saline rinse. Reassurance that not serious or worsening.

## 2020-07-15 NOTE — Assessment & Plan Note (Signed)
Considering TVAR. Reviewed cardiology note. Has structural cards appointment next week. Appreciate cardiology support. Cont lasix for hepatic congestion.

## 2020-07-15 NOTE — Assessment & Plan Note (Signed)
Again offered referral for 2nd opinion. Daughter will reach out if her [name] would like this.

## 2020-07-15 NOTE — Progress Notes (Signed)
Subjective:     [name] is a 87 y.o. female presenting for Follow-up (3 month- memory and BP )     HPI   #Aortic stenosis - seeing cardiology - they are suggesting she consider surgery  #Varicose veins - does not routinely wear compression socks - has in the past - is concerned about the appearance    Review of Systems  06/09/20: Clinic - CHF - weight stable. Dementia - encouraged neuro f/u. Ptosis - declined 2nd opinion  Social History   Tobacco Use  Smoking Status Former Smoker  . Packs/day: 0.25  . Years: 10.00  . Pack years: 2.50  . Types: Cigarettes  . Quit date: 14  . Years since quitting: 42.3  Smokeless Tobacco Never Used        Objective:    BP Readings from Last 3 Encounters:  07/15/20 (!) 148/90  06/12/20 (!) 142/78  06/09/20 136/82   Wt Readings from Last 3 Encounters:  07/15/20 150 lb 12 oz (68.4 kg)  06/12/20 148 lb 12.8 oz (67.5 kg)  06/09/20 150 lb (68 kg)    BP (!) 148/90   Pulse 79   Temp (!) 97.3 F (36.3 C) (Temporal)   Ht 5\' 1"  (1.549 m)   Wt 150 lb 12 oz (68.4 kg)   SpO2 94%   BMI 28.48 kg/m    Physical Exam Constitutional:      General: She is not in acute distress.    Appearance: She is well-developed. She is not diaphoretic.  HENT:     Right Ear: External ear normal.     Left Ear: External ear normal.  Eyes:     Conjunctiva/sclera: Conjunctivae normal.  Cardiovascular:     Rate and Rhythm: Normal rate and regular rhythm.     Heart sounds: Murmur heard.    Pulmonary:     Effort: Pulmonary effort is normal. No respiratory distress.     Breath sounds: Normal breath sounds. No wheezing.  Musculoskeletal:     Cervical back: Neck supple.     Comments: Thick varicose veins on bilateral legs through thighs.   Skin:    General: Skin is warm and dry.     Capillary Refill: Capillary refill takes less than 2 seconds.  Neurological:     Mental Status: She is alert. Mental status is at baseline.   Psychiatric:        Mood and Affect: Mood normal.        Behavior: Behavior normal.      Montreal Cognitive Assessment  07/15/2020  Visuospatial/ Executive (0/5) 2  Naming (0/3) 2  Attention: Read list of digits (0/2) 1  Attention: Read list of letters (0/1) 1  Attention: Serial 7 subtraction starting at 100 (0/3) 1  Language: Repeat phrase (0/2) 2  Language : Fluency (0/1) 1  Abstraction (0/2) 1  Delayed Recall (0/5) 0  Orientation (0/6) 4  Total 15        Assessment & Plan:   Problem List Items Addressed This Visit      Cardiovascular and Mediastinum   Hypertension    BP elevated today. Will monitoring. Cont lisinopril 30 mg and furosemide 20 mg      Aortic stenosis    Considering TVAR. Reviewed cardiology note. Has structural cards appointment next week. Appreciate cardiology support. Cont lasix for hepatic congestion.         Respiratory   Sinus congestion    Chronic issue. Has seen ENT. Encouraged continued  zyrtec, flonase, and saline rinse. Reassurance that not serious or worsening.         Nervous and Auditory   Carpal tunnel syndrome of right wrist    Persistent symptoms. She was told she needed surgery. Discussed waiting until after conversation with the heart specialist as would likely want to get cardiac risks reduced prior to pursuing any treatment.       Dementia without behavioral disturbance ([place]) - Primary    MOCA 15/30 today. Family noticing worsening. Not driving and caregivers almost 24/7 at the house. Encouraged earlier f/u with Dr. Delice Lesch, they will call to schedule after her cardiology f/u. Discussed progression and benefit of neurology support. Cont donepezil 10 mg and zoloft 50 mg.         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 Daughter will reach out if her [name] would like this.           Return in about 2 months (around 09/14/2020) for for check in, but can also call and update  at that time if no needs.  [name], MD  This visit occurred during the SARS-CoV-2 public health emergency.  Safety protocols were in place, including screening questions prior to the visit, additional usage of staff PPE, and extensive cleaning of exam room while observing appropriate contact time as indicated for disinfecting solutions.

## 2020-07-15 NOTE — Patient Instructions (Addendum)
#  Varicose veins - Have one of your care providers help you wear compression stockings - wear these every day to help with swelling  #Stenosis - continue discussing options with the cardiology team about surgery  #Memory Loss - Call Dr. Amparo Bristol office - memory is worse  #Eye droop - if you decide you want a second opinion just call the office or send a mychart and I will place the referral  #sinus drainage - continue flonase, saline rinse, allergy pill - consider Ear nose and throat

## 2020-07-21 ENCOUNTER — Encounter: Payer: Self-pay | Admitting: Cardiovascular Disease

## 2020-07-21 ENCOUNTER — Other Ambulatory Visit: Payer: Self-pay

## 2020-07-21 ENCOUNTER — Ambulatory Visit (INDEPENDENT_AMBULATORY_CARE_PROVIDER_SITE_OTHER): Payer: Medicare Other | Admitting: Cardiovascular Disease

## 2020-07-21 VITALS — BP 134/84 | HR 64 | Ht 61.0 in | Wt 150.8 lb

## 2020-07-21 DIAGNOSIS — I350 Nonrheumatic aortic (valve) stenosis: Secondary | ICD-10-CM | POA: Diagnosis not present

## 2020-07-21 NOTE — Progress Notes (Signed)
[facility] Consult Note  Chief Complaint  Patient presents with  . New Patient (Initial Visit)    Severe aortic stenosis   History of Present Illness: 87 yo female with history of persistent atrial fibrillation, complete heart blocker with permanent pacemaker, HTN, hypothyroidism, orthostatic hypotension, prior TIA, mild dementia and severe aortic stenosis who is here today as a new consult, referred by Dr. Caryl Comes,  for further discussion regarding her aortic stenosis and possible TAVR. She has persistent atrial fibrillation and I followed closely by Dr. Caryl Comes. She is on Eliquis. She has a pacemaker with history of complete heart block. She is known to have moderate aortic stenosis. Recent worsened lower extremity edema. Echo 07/11/20 with LVEF=60-65%, mild LVH, severe bi-atrial enlargement. Moderate mitral regurgitation. The aortic valve leaflets are thickened with limited leaflet excursion. Mean gradient 20.4 mmHg, peak gradient 38 mmHg, AVA 0.7 cm2, dimensionless index 0.25, SVI 27. This may represent paradoxical low flow,low gradient severe aortic stenosis. She has had recent lower extremity edema requiring Lasix.   She tells me today that she has been having less lower extremity edema since starting Lasix. She has ongoing, progressive dyspnea with exertion. No chest pain. She has some dizziness.  She lives in [place], [place] alone but has full time help at home. She goes to the dentist regularly and has not active dental issues. She is retired. 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 She is here today with her son. Her daughter who lives in [place] is on the phone during the visit.   Primary Care Physician: [name], MD Primary Cardiologist: Caryl Comes Referring Cardiologist: Caryl Comes  Past Medical History:  Diagnosis Date  . Aortic stenosis   . Arrhythmia    chronic atrial fib  . Atrial fibrillation, chronic ([place])   . Chronic anticoagulation   . Chronic  atrial fibrillation ([place]) 01/28/2017  . Chronic venous insufficiency 01/28/2017  . Clavicle fracture 11/20/2017  . Complication of anesthesia    " difficult waking "  . Long term current use of anticoagulant therapy 01/28/2017  . Osteoarthritis   . Presence of permanent cardiac pacemaker 01/28/2017   Original implant 1992 Lead Intermedics 431-04 Generator change 2001 and 2011.  Medtronic generator.   . SSS (sick sinus syndrome) ([place])   . Thyroid disease    hypothyroidism  . TIA (transient ischemic attack)     Past Surgical History:  Procedure Laterality Date  . ABDOMINAL HYSTERECTOMY    . INSERT / REPLACE / REMOVE PACEMAKER     generator change 2011 medtronic sigma  . KNEE SURGERY    . LEAD INSERTION N/A 03/05/2019   Procedure: LEAD INSERTION - RV LEAD;  Surgeon: Deboraha Sprang, MD;  Location: [facility];  Service: Cardiovascular;  Laterality: N/A;  . PACEMAKER IMPLANT N/A 03/05/2019   Procedure: PACEMAKER IMPLANT;  Surgeon: Deboraha Sprang, MD;  Location: [facility];  Service: Cardiovascular;  Laterality: N/A;  . PACEMAKER INSERTION    . PARS PLANA VITRECTOMY Right 07/19/2018   Procedure: VITRECTOMY WITH VITREOUS BIPOSY & ENDOLASER;  Surgeon: Jalene Mullet, MD;  Location: [facility];  Service: Ophthalmology;  Laterality: Right;  . PPM GENERATOR CHANGEOUT N/A 02/02/2017   Procedure: PPM GENERATOR CHANGEOUT;  Surgeon: Deboraha Sprang, MD;  Location: [facility];  Service: Cardiovascular;  Laterality: N/A;  . REPLACEMENT TOTAL KNEE BILATERAL    . VARICOSE VEIN SURGERY      Current Outpatient  Medications  Medication Sig Dispense Refill  . acetaminophen (TYLENOL) 500 MG tablet Take 1,000 mg by mouth every 8 (eight) hours as needed for moderate pain or headache.     Marland Kitchen atorvastatin (LIPITOR) 20 MG tablet TAKE 1 TABLET BY MOUTH EVERY DAY IN THE EVENING 90 tablet 1  . Carboxymethylcell-Hypromellose (GENTEAL OP) Place 1 drop into both eyes daily as needed (dry eyes).     . cetirizine (ZYRTEC) 5 MG tablet TAKE 1 TABLET BY MOUTH EVERY DAY 90 tablet 2  . Cyanocobalamin (B-12) 1000 MCG CAPS TAKE 1 CAPSULE BY MOUTH EVERY DAY 30 capsule 11  . D 2000 50 MCG (2000 UT) TABS TAKE 1 TABLET BY MOUTH EVERY DAY 90 tablet 3  . donepezil (ARICEPT) 10 MG tablet Take 1 tablet daily 90 tablet 3  . ELIQUIS 5 MG TABS tablet TAKE 1 TABLET BY MOUTH TWICE A DAY 60 tablet 2  . fluticasone (FLONASE) 50 MCG/ACT nasal spray PLACE 1 SPRAY INTO BOTH NOSTRILS DAILY AS NEEDED FOR ALLERGIES OR RHINITIS. 16 mL 2  . furosemide (LASIX) 20 MG tablet TAKE 1 TABLET BY MOUTH EVERY DAY 30 tablet 0  . levothyroxine (SYNTHROID) 88 MCG tablet TAKE 1 TABLET BY MOUTH EVERY MORNING ON AN EMPTY STOMACH 90 tablet 3  . lisinopril (ZESTRIL) 30 MG tablet Take 1 tablet (30 mg total) by mouth daily. 90 tablet 3  . oxybutynin (DITROPAN-XL) 5 MG 24 hr tablet TAKE 1 TABLET BY MOUTH EVERY DAY 90 tablet 1  . sertraline (ZOLOFT) 50 MG tablet Take 1 tablet (50 mg total) by mouth at bedtime. 30 tablet 2   No current facility-administered medications for this visit.    Allergies  Allergen Reactions  . Adhesive [Tape] Other (See Comments)    TAPE WILL TEAR THE SKIN!!!!  . Penicillins Hives and Rash    Did it involve swelling of the face/tongue/throat, SOB, or low BP? No Did it involve sudden or severe rash/hives, skin peeling, or any reaction on the inside of your mouth or nose? Yes Did you need to seek medical attention at a hospital or doctor's office? Unknown  When did it last happen?unknown  If all above answers are "NO", may proceed with cephalosporin use.     Social History   Socioeconomic History  . Marital status: Widowed    Spouse name: Not on file  . Number of children: 2  . Years of education: 2- year college  . Highest education level: Not on file  Occupational History  . Not on file  Tobacco Use  . Smoking status: Former Smoker    Packs/day: 0.25    Years: 10.00    Pack years: 2.50     Types: Cigarettes    Quit date: 1980    Years since quitting: 42.3  . Smokeless tobacco: Never Used  Vaping Use  . Vaping Use: Never used  Substance and Sexual Activity  . Alcohol use: No  . Drug use: No  . Sexual activity: Never  Other Topics Concern  . Not on file  Social History Narrative   03/14/19   From: the area   Living: alone, but caretaker that - 20 hours a day of care givers, is alone from 4-8 pm   Work: retired -    Widowed: husband died from [place], used to run a golf course      Family: 2 children - [name] ([place]), Marcello Moores (lives nearby), has grandchildren and great-grandchildren      Enjoys: play  golf (not as much), painting, crafting    Right handed      Right handed   Exercise: yard work   Diet: anything, whatever she can find      Safety   Seat belts: Yes    Guns: No   Safe in relationships: Yes    Social Determinants of Health   Financial Resource Strain: Low Risk   . Difficulty of Paying Living Expenses: Not hard at all  Food Insecurity: No Food Insecurity  . Worried About Charity fundraiser in the Last Year: Never true  . Ran Out of Food in the Last Year: Never true  Transportation Needs: No Transportation Needs  . Lack of Transportation (Medical): No  . Lack of Transportation (Non-Medical): No  Physical Activity: Inactive  . Days of Exercise per Week: 0 days  . Minutes of Exercise per Session: 0 min  Stress: No Stress Concern Present  . Feeling of Stress : Not at all  Social Connections: Not on file  Intimate Partner Violence: Not At Risk  . Fear of Current or Ex-Partner: No  . Emotionally Abused: No  . Physically Abused: No  . Sexually Abused: No    Family History  Problem Relation Age of Onset  . Diabetes Mother   . Mental illness Mother   . Diabetes Father   . Heart disease Father     Review of Systems:  As stated in the HPI and otherwise negative.   BP 134/84   Pulse 64   Ht 5\' 1"  (1.549 m)    Wt 150 lb 12.8 oz (68.4 kg)   SpO2 96%   BMI 28.49 kg/m   Physical Examination: General: Well developed, well nourished, NAD  HEENT: OP clear, mucus membranes moist  SKIN: warm, dry. No rashes. Neuro: No focal deficits  Musculoskeletal: Muscle strength 5/5 all ext  Psychiatric: Mood and affect normal  Neck: No JVD, no carotid bruits, no thyromegaly, no lymphadenopathy.  Lungs:Clear bilaterally, no wheezes, rhonci, crackles Cardiovascular: Regular rate and rhythm. Systolic murmur noted.  Abdomen:Soft. Bowel sounds present. Non-tender.  Extremities: No lower extremity edema. Pulses are 2 + in the bilateral DP/PT.  EKG:  EKG is ordered today. The ekg ordered today demonstrates Atrial fib, v-paced  Echo April 2022: 1. Left ventricular ejection fraction, by estimation, is 60 to 65%. The  left ventricle has normal function. The left ventricle has no regional  wall motion abnormalities. There is mild concentric left ventricular  hypertrophy of the basal-septal segment.  Left ventricular diastolic parameters are indeterminate.  2. Right ventricular systolic function is mildly reduced. The right  ventricular size is mildly enlarged. There is moderately elevated  pulmonary artery systolic pressure. The estimated right ventricular  systolic pressure is 15.1 mmHg.  3. Left atrial size was severely dilated.  4. Right atrial size was severely dilated.  5. The mitral valve is grossly normal. Moderate, eccentric mitral valve  regurgitation.  6. Tricuspid valve regurgitation is at least moderate.  7. The aortic valve is tricuspid. Aortic valve regurgitation is trivial.  Moderate aortic valve stenosis. Aortic valve mean gradient measures 20.4  mmHg. Aortic valve Vmax measures 3.08 m/s.   Comparison(s): A prior study was performed on 10/30/2019. Prior images  reviewed side by side. Compared to prior, left ventricle is more dilated  that prior but still within normal limits. Similar Aortic  DVI from prior.   Conclusion(s)/Recommendation(s): LV stroke volume index of 27- consider  additional imaging for aortic valve stenosis  only if clinically indicated.   FINDINGS  Left Ventricle: Left ventricular ejection fraction, by estimation, is 60  to 65%. The left ventricle has normal function. The left ventricle has no  regional wall motion abnormalities. The left ventricular internal cavity  size was normal in size. There is  mild concentric left ventricular hypertrophy of the basal-septal segment.  Left ventricular diastolic parameters are indeterminate.   Right Ventricle: The right ventricular size is mildly enlarged. No  increase in right ventricular wall thickness. Right ventricular systolic  function is mildly reduced. There is moderately elevated pulmonary artery  systolic pressure. The tricuspid  regurgitant velocity is 3.12 m/s, and with an assumed right atrial  pressure of 8 mmHg, the estimated right ventricular systolic pressure is  69.6 mmHg.   Left Atrium: Left atrial size was severely dilated.   Right Atrium: Right atrial size was severely dilated.   Pericardium: There is no evidence of pericardial effusion.   Mitral Valve: The mitral valve is grossly normal. Moderate mitral valve  regurgitation.   Tricuspid Valve: The tricuspid valve is normal in structure. Tricuspid  valve regurgitation is moderate.   Aortic Valve: The aortic valve is tricuspid. Aortic valve regurgitation is  trivial. Moderate aortic stenosis is present. Aortic valve mean gradient  measures 20.4 mmHg. Aortic valve peak gradient measures 38.0 mmHg. Aortic  valve area, by VTI measures 0.70  cm.   Pulmonic Valve: The pulmonic valve was normal in structure. Pulmonic valve  regurgitation is mild.   Aorta: The aortic root is normal in size and structure.   IAS/Shunts: The atrial septum is grossly normal.   Additional Comments: A device lead is visualized.     LEFT VENTRICLE  PLAX  2D  LVIDd:     4.60 cm Diastology  LVIDs:     3.20 cm LV e' medial:  5.84 cm/s  LV PW:     1.10 cm LV E/e' medial: 28.8  LV IVS:    1.50 cm LV e' lateral:  10.14 cm/s  LVOT diam:   1.90 cm LV E/e' lateral: 16.6  LV SV:     45  LV SV Index:  27  LVOT Area:   2.84 cm     RIGHT VENTRICLE      IVC  RVSP:      41.9 mmHg IVC diam: 1.40 cm   LEFT ATRIUM       Index    RIGHT ATRIUM      Index  LA diam:    5.60 cm 3.36 cm/m RA Pressure: 3.00 mmHg  LA Vol (A2C):  122.0 ml 73.24 ml/m RA Area:   27.80 cm  LA Vol (A4C):  103.0 ml 61.84 ml/m RA Volume:  97.20 ml 58.35 ml/m  LA Biplane Vol: 112.0 ml 67.24 ml/m  AORTIC VALVE  AV Area (Vmax):  0.69 cm  AV Area (Vmean):  0.72 cm  AV Area (VTI):   0.70 cm  AV Vmax:      308.20 cm/s  AV Vmean:     205.200 cm/s  AV VTI:      0.645 m  AV Peak Grad:   38.0 mmHg  AV Mean Grad:   20.4 mmHg  LVOT Vmax:     75.44 cm/s  LVOT Vmean:    51.860 cm/s  LVOT VTI:     0.160 m  LVOT/AV VTI ratio: 0.25    AORTA  Ao Root diam: 3.00 cm  Ao Asc diam: 3.60 cm   MV E velocity:  168.20 cm/s TRICUSPID VALVE               TR Peak grad:  38.9 mmHg               TR Vmax:    312.00 cm/s               Estimated RAP: 3.00 mmHg               RVSP:      41.9 mmHg                 SHUNTS               Systemic VTI: 0.16 m               Systemic Diam: 1.90 cm   Recent Labs: 09/17/2019: TSH 1.45 04/10/2020: ALT 15 05/09/2020: Hemoglobin 12.0; Platelets 192 06/09/2020: BUN 16; Creatinine, Ser 1.23; Magnesium 1.9; Potassium 3.7; Sodium 137    Wt Readings from Last 3 Encounters:  07/21/20 150 lb 12.8 oz (68.4 kg)  07/15/20 150 lb 12 oz (68.4 kg)  06/12/20 148 lb 12.8 oz (67.5 kg)     Other studies Reviewed: Additional studies/  records that were reviewed today include: Echo images, EKG, office notes Review of the above records demonstrates: Severe AS   STS Risk Score: Procedure: AVR + CAB Risk of Mortality: 8.044% Renal Failure: 5.306% Permanent Stroke: 3.076% Prolonged Ventilation: 16.815% DSW Infection: 0.113% Reoperation: 4.281% Morbidity or Mortality: 23.858% Short Length of Stay: 11.021% Long Length of Stay: 12.708%  Assessment and Plan:   1. Severe Aortic Valve Stenosis: She has severe, stage D3 aortic valve stenosis (paradoxical low flow/low gradient AS). I have personally reviewed the echo images. The aortic valve is thickened, calcified with limited leaflet mobility. I think she would benefit from AVR. Given advanced age, she is not a good candidate for conventional AVR by surgical approach. I think she may be a good candidate for TAVR.   I have reviewed the natural history of aortic stenosis with the patient and their family members  who are present today. We have discussed the limitations of medical therapy and the poor prognosis associated with symptomatic aortic stenosis. We have reviewed potential treatment options, including palliative medical therapy, conventional surgical aortic valve replacement, and transcatheter aortic valve replacement. We discussed treatment options in the context of the patient's specific comorbid medical conditions.   She would like to proceed with planning for TAVR. I will arrange a right and left heart catheterization at [facility] 07/31/20. Risks and benefits of the cath procedure and the valve procedure are reviewed with the patient. After the cath, she will have a cardiac CT, CTA of the chest/abdomen and pelvis, carotid artery dopplers, PT assessment and will then be referred to see one of the CT surgeons on our TAVR team.   BMET and CBC today. 4 hours hydration pre-cath. Hold Eliquis two days before cardiac cath.      Current medicines are reviewed at length with  the patient today.  The patient does not have concerns regarding medicines.  The following changes have been made:  no change  Labs/ tests ordered today include:   Orders Placed This Encounter  Procedures  . Basic metabolic panel  . CBC  . EKG 12-Lead     Disposition:   F/U with the valve team.    Signed, Lauree Chandler, MD 07/21/2020 1:15 PM    [facility] [id number]  [address] [place], [place]  [address] Phone: [phone]; Fax: [phone]

## 2020-07-21 NOTE — Patient Instructions (Signed)
Medication Instructions:  Your physician recommends that you continue on your current medications as directed. Please refer to the Current Medication list given to you today.  *If you need a refill on your cardiac medications before your next appointment, please call your pharmacy*   Lab Work: BMET and CBC today  If you have labs (blood work) drawn today and your tests are completely normal, you will receive your results only by: Marland Kitchen MyChart Message (if you have MyChart) OR . A paper copy in the mail If you have any lab test that is abnormal or we need to change your treatment, we will call you to review the results.   Testing/Procedures: Your physician has requested that you have a cardiac catheterization. Cardiac catheterization is used to diagnose and/or treat various heart conditions. Doctors may recommend this procedure for a number of different reasons. The most common reason is to evaluate chest pain. Chest pain can be a symptom of coronary artery disease (CAD), and cardiac catheterization can show whether plaque is narrowing or blocking your heart's arteries. This procedure is also used to evaluate the valves, as well as measure the blood flow and oxygen levels in different parts of your heart. For further information please visit [email or web address]. Please follow instruction sheet, as given.    Follow-Up:  You will be contacted about follow up after the heart catheterization.   Other Instructions   Due to recent COVID-19 restrictions implemented by our local and state authorities and in an effort to keep both patients and staff as safe as possible, our hospital system requires COVID-19 testing prior to certain scheduled hospital procedures.  Please go to [facility]. [place], [place] [address] on Tuesday, May 10th at 10:05am.  This is a drive up testing site.  You will not need to exit your vehicle. You must agree to self-quarantine from the time of your testing until the  procedure date on Thursday, May 12th.  This should included staying home with ONLY the people you live with.  Avoid take-out, grocery store shopping or leaving the house for any non-emergent reason.  Failure to have your COVID-19 test done on the date and time you have been scheduled will result in cancellation of your procedure.  Please call our office at [phone] if you have any questions.     [facility] [place], [place] [address] Dept: [phone] Loc: [phone]  PAIDEN CARAVEO  07/21/2020  You are scheduled for a Cardiac Catheterization on Thursday, May 12 with Dr. Lauree Chandler.  1. Please arrive at the [facility] (Main Entrance A) at [facility]: [address] [place], [place] [address] at 7:00 AM (This time is two hours before your procedure to ensure your preparation). Free valet parking service is available.   Special note: Every effort is made to have your procedure done on time. Please understand that emergencies sometimes delay scheduled procedures.  2. Diet: Do not eat solid foods after midnight.  The patient may have clear liquids until 5am upon the day of the procedure.  3. Labs: You will have labs completed today.  4. Medication instructions in preparation for your procedure:   Contrast Allergy: No  Stop taking Eliquis (Apixiban) on Tuesday, May 10.  Stop taking, Lisinopril (Zestril or Prinivil) Wednesday, May 11, do not take your Furosemide starting Wednesday, May 11th   On the morning of your procedure, take Aspirin and  any morning medicines NOT listed above.  You may use sips of water.  5. Plan for one night stay--bring personal belongings. 6. Bring a current list of your medications and current insurance cards. 7. You MUST have a responsible person to drive you home. 8. Someone MUST be with you the first 24 hours after you  arrive home or your discharge will be delayed. 9. Please wear clothes that are easy to get on and off and wear slip-on shoes.  Thank you for allowing [place] to care for you!   -- [facility]

## 2020-07-21 NOTE — H&P (View-Only) (Signed)
[facility] Consult Note  Chief Complaint  Patient presents with  . New Patient (Initial Visit)    Severe aortic stenosis   History of Present Illness: 87 yo female with history of persistent atrial fibrillation, complete heart blocker with permanent pacemaker, HTN, hypothyroidism, orthostatic hypotension, prior TIA, mild dementia and severe aortic stenosis who is here today as a new consult, referred by Dr. Caryl Comes,  for further discussion regarding her aortic stenosis and possible TAVR. She has persistent atrial fibrillation and I followed closely by Dr. Caryl Comes. She is on Eliquis. She has a pacemaker with history of complete heart block. She is known to have moderate aortic stenosis. Recent worsened lower extremity edema. Echo 07/11/20 with LVEF=60-65%, mild LVH, severe bi-atrial enlargement. Moderate mitral regurgitation. The aortic valve leaflets are thickened with limited leaflet excursion. Mean gradient 20.4 mmHg, peak gradient 38 mmHg, AVA 0.7 cm2, dimensionless index 0.25, SVI 27. This may represent paradoxical low flow,low gradient severe aortic stenosis. She has had recent lower extremity edema requiring Lasix.   She tells me today that she has been having less lower extremity edema since starting Lasix. She has ongoing, progressive dyspnea with exertion. No chest pain. She has some dizziness.  She lives in [place], [place] alone but has full time help at home. She goes to the dentist regularly and has not active dental issues. She is retired. 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 She is here today with her son. Her daughter who lives in [place] is on the phone during the visit.   Primary Care Physician: [name], MD Primary Cardiologist: Caryl Comes Referring Cardiologist: Caryl Comes  Past Medical History:  Diagnosis Date  . Aortic stenosis   . Arrhythmia    chronic atrial fib  . Atrial fibrillation, chronic ([place])   . Chronic anticoagulation   . Chronic  atrial fibrillation ([place]) 01/28/2017  . Chronic venous insufficiency 01/28/2017  . Clavicle fracture 11/20/2017  . Complication of anesthesia    " difficult waking "  . Long term current use of anticoagulant therapy 01/28/2017  . Osteoarthritis   . Presence of permanent cardiac pacemaker 01/28/2017   Original implant 1992 Lead Intermedics 431-04 Generator change 2001 and 2011.  Medtronic generator.   . SSS (sick sinus syndrome) ([place])   . Thyroid disease    hypothyroidism  . TIA (transient ischemic attack)     Past Surgical History:  Procedure Laterality Date  . ABDOMINAL HYSTERECTOMY    . INSERT / REPLACE / REMOVE PACEMAKER     generator change 2011 medtronic sigma  . KNEE SURGERY    . LEAD INSERTION N/A 03/05/2019   Procedure: LEAD INSERTION - RV LEAD;  Surgeon: Deboraha Sprang, MD;  Location: [facility];  Service: Cardiovascular;  Laterality: N/A;  . PACEMAKER IMPLANT N/A 03/05/2019   Procedure: PACEMAKER IMPLANT;  Surgeon: Deboraha Sprang, MD;  Location: [facility];  Service: Cardiovascular;  Laterality: N/A;  . PACEMAKER INSERTION    . PARS PLANA VITRECTOMY Right 07/19/2018   Procedure: VITRECTOMY WITH VITREOUS BIPOSY & ENDOLASER;  Surgeon: Jalene Mullet, MD;  Location: [facility];  Service: Ophthalmology;  Laterality: Right;  . PPM GENERATOR CHANGEOUT N/A 02/02/2017   Procedure: PPM GENERATOR CHANGEOUT;  Surgeon: Deboraha Sprang, MD;  Location: [facility];  Service: Cardiovascular;  Laterality: N/A;  . REPLACEMENT TOTAL KNEE BILATERAL    . VARICOSE VEIN SURGERY      Current Outpatient  Medications  Medication Sig Dispense Refill  . acetaminophen (TYLENOL) 500 MG tablet Take 1,000 mg by mouth every 8 (eight) hours as needed for moderate pain or headache.     Marland Kitchen atorvastatin (LIPITOR) 20 MG tablet TAKE 1 TABLET BY MOUTH EVERY DAY IN THE EVENING 90 tablet 1  . Carboxymethylcell-Hypromellose (GENTEAL OP) Place 1 drop into both eyes daily as needed (dry eyes).     . cetirizine (ZYRTEC) 5 MG tablet TAKE 1 TABLET BY MOUTH EVERY DAY 90 tablet 2  . Cyanocobalamin (B-12) 1000 MCG CAPS TAKE 1 CAPSULE BY MOUTH EVERY DAY 30 capsule 11  . D 2000 50 MCG (2000 UT) TABS TAKE 1 TABLET BY MOUTH EVERY DAY 90 tablet 3  . donepezil (ARICEPT) 10 MG tablet Take 1 tablet daily 90 tablet 3  . ELIQUIS 5 MG TABS tablet TAKE 1 TABLET BY MOUTH TWICE A DAY 60 tablet 2  . fluticasone (FLONASE) 50 MCG/ACT nasal spray PLACE 1 SPRAY INTO BOTH NOSTRILS DAILY AS NEEDED FOR ALLERGIES OR RHINITIS. 16 mL 2  . furosemide (LASIX) 20 MG tablet TAKE 1 TABLET BY MOUTH EVERY DAY 30 tablet 0  . levothyroxine (SYNTHROID) 88 MCG tablet TAKE 1 TABLET BY MOUTH EVERY MORNING ON AN EMPTY STOMACH 90 tablet 3  . lisinopril (ZESTRIL) 30 MG tablet Take 1 tablet (30 mg total) by mouth daily. 90 tablet 3  . oxybutynin (DITROPAN-XL) 5 MG 24 hr tablet TAKE 1 TABLET BY MOUTH EVERY DAY 90 tablet 1  . sertraline (ZOLOFT) 50 MG tablet Take 1 tablet (50 mg total) by mouth at bedtime. 30 tablet 2   No current facility-administered medications for this visit.    Allergies  Allergen Reactions  . Adhesive [Tape] Other (See Comments)    TAPE WILL TEAR THE SKIN!!!!  . Penicillins Hives and Rash    Did it involve swelling of the face/tongue/throat, SOB, or low BP? No Did it involve sudden or severe rash/hives, skin peeling, or any reaction on the inside of your mouth or nose? Yes Did you need to seek medical attention at a hospital or doctor's office? Unknown  When did it last happen?unknown  If all above answers are "NO", may proceed with cephalosporin use.     Social History   Socioeconomic History  . Marital status: Widowed    Spouse name: Not on file  . Number of children: 2  . Years of education: 2- year college  . Highest education level: Not on file  Occupational History  . Not on file  Tobacco Use  . Smoking status: Former Smoker    Packs/day: 0.25    Years: 10.00    Pack years: 2.50     Types: Cigarettes    Quit date: 1980    Years since quitting: 42.3  . Smokeless tobacco: Never Used  Vaping Use  . Vaping Use: Never used  Substance and Sexual Activity  . Alcohol use: No  . Drug use: No  . Sexual activity: Never  Other Topics Concern  . Not on file  Social History Narrative   03/14/19   From: the area   Living: alone, but caretaker that - 20 hours a day of care givers, is alone from 4-8 pm   Work: retired -    Widowed: husband died from [place], used to run a golf course      Family: 2 children - [name] ([place]), Marcello Moores (lives nearby), has grandchildren and great-grandchildren      Enjoys: play  golf (not as much), painting, crafting    Right handed      Right handed   Exercise: yard work   Diet: anything, whatever she can find      Safety   Seat belts: Yes    Guns: No   Safe in relationships: Yes    Social Determinants of Health   Financial Resource Strain: Low Risk   . Difficulty of Paying Living Expenses: Not hard at all  Food Insecurity: No Food Insecurity  . Worried About Charity fundraiser in the Last Year: Never true  . Ran Out of Food in the Last Year: Never true  Transportation Needs: No Transportation Needs  . Lack of Transportation (Medical): No  . Lack of Transportation (Non-Medical): No  Physical Activity: Inactive  . Days of Exercise per Week: 0 days  . Minutes of Exercise per Session: 0 min  Stress: No Stress Concern Present  . Feeling of Stress : Not at all  Social Connections: Not on file  Intimate Partner Violence: Not At Risk  . Fear of Current or Ex-Partner: No  . Emotionally Abused: No  . Physically Abused: No  . Sexually Abused: No    Family History  Problem Relation Age of Onset  . Diabetes Mother   . Mental illness Mother   . Diabetes Father   . Heart disease Father     Review of Systems:  As stated in the HPI and otherwise negative.   BP 134/84   Pulse 64   Ht 5\' 1"  (1.549 m)    Wt 150 lb 12.8 oz (68.4 kg)   SpO2 96%   BMI 28.49 kg/m   Physical Examination: General: Well developed, well nourished, NAD  HEENT: OP clear, mucus membranes moist  SKIN: warm, dry. No rashes. Neuro: No focal deficits  Musculoskeletal: Muscle strength 5/5 all ext  Psychiatric: Mood and affect normal  Neck: No JVD, no carotid bruits, no thyromegaly, no lymphadenopathy.  Lungs:Clear bilaterally, no wheezes, rhonci, crackles Cardiovascular: Regular rate and rhythm. Systolic murmur noted.  Abdomen:Soft. Bowel sounds present. Non-tender.  Extremities: No lower extremity edema. Pulses are 2 + in the bilateral DP/PT.  EKG:  EKG is ordered today. The ekg ordered today demonstrates Atrial fib, v-paced  Echo April 2022: 1. Left ventricular ejection fraction, by estimation, is 60 to 65%. The  left ventricle has normal function. The left ventricle has no regional  wall motion abnormalities. There is mild concentric left ventricular  hypertrophy of the basal-septal segment.  Left ventricular diastolic parameters are indeterminate.  2. Right ventricular systolic function is mildly reduced. The right  ventricular size is mildly enlarged. There is moderately elevated  pulmonary artery systolic pressure. The estimated right ventricular  systolic pressure is 15.1 mmHg.  3. Left atrial size was severely dilated.  4. Right atrial size was severely dilated.  5. The mitral valve is grossly normal. Moderate, eccentric mitral valve  regurgitation.  6. Tricuspid valve regurgitation is at least moderate.  7. The aortic valve is tricuspid. Aortic valve regurgitation is trivial.  Moderate aortic valve stenosis. Aortic valve mean gradient measures 20.4  mmHg. Aortic valve Vmax measures 3.08 m/s.   Comparison(s): A prior study was performed on 10/30/2019. Prior images  reviewed side by side. Compared to prior, left ventricle is more dilated  that prior but still within normal limits. Similar Aortic  DVI from prior.   Conclusion(s)/Recommendation(s): LV stroke volume index of 27- consider  additional imaging for aortic valve stenosis  only if clinically indicated.   FINDINGS  Left Ventricle: Left ventricular ejection fraction, by estimation, is 60  to 65%. The left ventricle has normal function. The left ventricle has no  regional wall motion abnormalities. The left ventricular internal cavity  size was normal in size. There is  mild concentric left ventricular hypertrophy of the basal-septal segment.  Left ventricular diastolic parameters are indeterminate.   Right Ventricle: The right ventricular size is mildly enlarged. No  increase in right ventricular wall thickness. Right ventricular systolic  function is mildly reduced. There is moderately elevated pulmonary artery  systolic pressure. The tricuspid  regurgitant velocity is 3.12 m/s, and with an assumed right atrial  pressure of 8 mmHg, the estimated right ventricular systolic pressure is  69.6 mmHg.   Left Atrium: Left atrial size was severely dilated.   Right Atrium: Right atrial size was severely dilated.   Pericardium: There is no evidence of pericardial effusion.   Mitral Valve: The mitral valve is grossly normal. Moderate mitral valve  regurgitation.   Tricuspid Valve: The tricuspid valve is normal in structure. Tricuspid  valve regurgitation is moderate.   Aortic Valve: The aortic valve is tricuspid. Aortic valve regurgitation is  trivial. Moderate aortic stenosis is present. Aortic valve mean gradient  measures 20.4 mmHg. Aortic valve peak gradient measures 38.0 mmHg. Aortic  valve area, by VTI measures 0.70  cm.   Pulmonic Valve: The pulmonic valve was normal in structure. Pulmonic valve  regurgitation is mild.   Aorta: The aortic root is normal in size and structure.   IAS/Shunts: The atrial septum is grossly normal.   Additional Comments: A device lead is visualized.     LEFT VENTRICLE  PLAX  2D  LVIDd:     4.60 cm Diastology  LVIDs:     3.20 cm LV e' medial:  5.84 cm/s  LV PW:     1.10 cm LV E/e' medial: 28.8  LV IVS:    1.50 cm LV e' lateral:  10.14 cm/s  LVOT diam:   1.90 cm LV E/e' lateral: 16.6  LV SV:     45  LV SV Index:  27  LVOT Area:   2.84 cm     RIGHT VENTRICLE      IVC  RVSP:      41.9 mmHg IVC diam: 1.40 cm   LEFT ATRIUM       Index    RIGHT ATRIUM      Index  LA diam:    5.60 cm 3.36 cm/m RA Pressure: 3.00 mmHg  LA Vol (A2C):  122.0 ml 73.24 ml/m RA Area:   27.80 cm  LA Vol (A4C):  103.0 ml 61.84 ml/m RA Volume:  97.20 ml 58.35 ml/m  LA Biplane Vol: 112.0 ml 67.24 ml/m  AORTIC VALVE  AV Area (Vmax):  0.69 cm  AV Area (Vmean):  0.72 cm  AV Area (VTI):   0.70 cm  AV Vmax:      308.20 cm/s  AV Vmean:     205.200 cm/s  AV VTI:      0.645 m  AV Peak Grad:   38.0 mmHg  AV Mean Grad:   20.4 mmHg  LVOT Vmax:     75.44 cm/s  LVOT Vmean:    51.860 cm/s  LVOT VTI:     0.160 m  LVOT/AV VTI ratio: 0.25    AORTA  Ao Root diam: 3.00 cm  Ao Asc diam: 3.60 cm   MV E velocity:  168.20 cm/s TRICUSPID VALVE               TR Peak grad:  38.9 mmHg               TR Vmax:    312.00 cm/s               Estimated RAP: 3.00 mmHg               RVSP:      41.9 mmHg                 SHUNTS               Systemic VTI: 0.16 m               Systemic Diam: 1.90 cm   Recent Labs: 09/17/2019: TSH 1.45 04/10/2020: ALT 15 05/09/2020: Hemoglobin 12.0; Platelets 192 06/09/2020: BUN 16; Creatinine, Ser 1.23; Magnesium 1.9; Potassium 3.7; Sodium 137    Wt Readings from Last 3 Encounters:  07/21/20 150 lb 12.8 oz (68.4 kg)  07/15/20 150 lb 12 oz (68.4 kg)  06/12/20 148 lb 12.8 oz (67.5 kg)     Other studies Reviewed: Additional studies/  records that were reviewed today include: Echo images, EKG, office notes Review of the above records demonstrates: Severe AS   STS Risk Score: Procedure: AVR + CAB Risk of Mortality: 8.044% Renal Failure: 5.306% Permanent Stroke: 3.076% Prolonged Ventilation: 16.815% DSW Infection: 0.113% Reoperation: 4.281% Morbidity or Mortality: 23.858% Short Length of Stay: 11.021% Long Length of Stay: 12.708%  Assessment and Plan:   1. Severe Aortic Valve Stenosis: She has severe, stage D3 aortic valve stenosis (paradoxical low flow/low gradient AS). I have personally reviewed the echo images. The aortic valve is thickened, calcified with limited leaflet mobility. I think she would benefit from AVR. Given advanced age, she is not a good candidate for conventional AVR by surgical approach. I think she may be a good candidate for TAVR.   I have reviewed the natural history of aortic stenosis with the patient and their family members  who are present today. We have discussed the limitations of medical therapy and the poor prognosis associated with symptomatic aortic stenosis. We have reviewed potential treatment options, including palliative medical therapy, conventional surgical aortic valve replacement, and transcatheter aortic valve replacement. We discussed treatment options in the context of the patient's specific comorbid medical conditions.   She would like to proceed with planning for TAVR. I will arrange a right and left heart catheterization at [facility] 07/31/20. Risks and benefits of the cath procedure and the valve procedure are reviewed with the patient. After the cath, she will have a cardiac CT, CTA of the chest/abdomen and pelvis, carotid artery dopplers, PT assessment and will then be referred to see one of the CT surgeons on our TAVR team.   BMET and CBC today. 4 hours hydration pre-cath. Hold Eliquis two days before cardiac cath.      Current medicines are reviewed at length with  the patient today.  The patient does not have concerns regarding medicines.  The following changes have been made:  no change  Labs/ tests ordered today include:   Orders Placed This Encounter  Procedures  . Basic metabolic panel  . CBC  . EKG 12-Lead     Disposition:   F/U with the valve team.    Signed, Lauree Chandler, MD 07/21/2020 1:15 PM    [facility] [id number]  [address] [place], [place]  [address] Phone: [phone]; Fax: [phone]

## 2020-07-22 LAB — BASIC METABOLIC PANEL
BUN/Creatinine Ratio: 18 (ref 12–28)
BUN: 24 mg/dL (ref 8–27)
CO2: 28 mmol/L (ref 20–29)
Calcium: 9.8 mg/dL (ref 8.7–10.3)
Chloride: 96 mmol/L (ref 96–106)
Creatinine, Ser: 1.35 mg/dL — ABNORMAL HIGH (ref 0.57–1.00)
Glucose: 99 mg/dL (ref 65–99)
Potassium: 4 mmol/L (ref 3.5–5.2)
Sodium: 137 mmol/L (ref 134–144)
eGFR: 38 mL/min/{1.73_m2} — ABNORMAL LOW (ref >59)

## 2020-07-22 LAB — CBC
Hematocrit: 33.5 % — ABNORMAL LOW (ref 34.0–46.6)
Hemoglobin: 10.8 g/dL — ABNORMAL LOW (ref 11.1–15.9)
MCH: 26.4 pg — ABNORMAL LOW (ref 26.6–33.0)
MCHC: 32.2 g/dL (ref 31.5–35.7)
MCV: 82 fL (ref 79–97)
Platelets: 196 10*3/uL (ref 150–450)
RBC: 4.09 x10E6/uL (ref 3.77–5.28)
RDW: 15.3 % (ref 11.7–15.4)
WBC: 6.7 10*3/uL (ref 3.4–10.8)

## 2020-07-23 ENCOUNTER — Other Ambulatory Visit: Payer: Self-pay | Admitting: Family Medicine

## 2020-07-23 DIAGNOSIS — I509 Heart failure, unspecified: Secondary | ICD-10-CM

## 2020-07-25 ENCOUNTER — Telehealth: Payer: Self-pay | Admitting: *Deleted

## 2020-07-25 ENCOUNTER — Telehealth: Payer: Self-pay | Admitting: Cardiovascular Disease

## 2020-07-25 NOTE — Telephone Encounter (Signed)
Generally all her medications are either to help symptoms that she has or to help her memory and heart.   We've discussed her medications in the past during office visits and have reviewed that each of them have a good purpose. We can discuss again at a future office visit.

## 2020-07-25 NOTE — Telephone Encounter (Signed)
Patient should stop Lisinopril and furosemide after dose on 5/10. Reviewed with Dr Angelena Form and Fuller Canada, PharmD and patient should stop Eliquis after evening dose on 5/10. Patient notified.  She is also aware to take Aspirin 81 mg the morning of the procedure

## 2020-07-25 NOTE — Telephone Encounter (Signed)
  Patient states that she is having a procedure on 07/31/20 with Dr Angelena Form and she does not remember which meds he told her to stop taking and when before her surgery. Please advise.

## 2020-07-25 NOTE — Telephone Encounter (Signed)
Pt called in to triage saying she is having heart surgery and thinks she's on to much medication. Pt wants to know if she should be on this much meds. I asked pt who told her she was on to much med and she said her "heart doctor" I did advise pt I would send message to PCP but if cardiologist told her she was on to much meds then she may want to reach out to cardiologist to see what meds they think she shouldn't be on. Pt said she would call cardiologist but still wanted PCP to review med list and tell her what meds she can come off of

## 2020-07-28 NOTE — Telephone Encounter (Signed)
Left a message at home number to call the office back.

## 2020-07-29 ENCOUNTER — Other Ambulatory Visit (HOSPITAL_COMMUNITY)
Admission: RE | Admit: 2020-07-29 | Discharge: 2020-07-29 | Disposition: A | Discharge status: Home / Self Care | Payer: Medicare Other | Source: Ambulatory Visit | Attending: Cardiovascular Disease | Admitting: Cardiovascular Disease

## 2020-07-29 DIAGNOSIS — Z01812 Encounter for preprocedural laboratory examination: Secondary | ICD-10-CM | POA: Diagnosis not present

## 2020-07-29 DIAGNOSIS — Z20822 Contact with and (suspected) exposure to covid-19: Secondary | ICD-10-CM | POA: Insufficient documentation

## 2020-07-29 LAB — SARS CORONAVIRUS 2 (TAT 6-24 HRS): SARS Coronavirus 2: NEGATIVE

## 2020-07-29 NOTE — Progress Notes (Incomplete)
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 Discussed the diagnosis of dementia  . Discussed safety both in and out of the home.  . Discussed the importance of regular daily schedule with inclusion of crossword puzzles to maintain brain function.  . Continue to monitor mood. Continue Sertraline 50 mg daily . Continue to stay active exercising  at least 30 minutes at least 3 times a week.  . Naps should be scheduled and should be no longer than 60 minutes and should not occur after 2 PM.  . Mediterranean diet is recommended  . Continue  . Follow up in  6 months.  Case discussed with Dr. 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  The patient is on donepezil 10 mg daily without side effects. She is seen on a 63-month follow-up for mixed dementia.  The patient is accompanied by her daughter [name]  who supplements the history.  Previous records as well as any outside records available were reviewed prior to todays visit.  She was last seen on 04/18/2020, at which time she was noted to have more agitation and irritability, for which sertraline was increased to 100 mg daily.  However, due to oversedation, sertraline was reduced once again to 50 mg daily. Caregivers administering her medications, [name].  Patient does not drive.  She has a caregiver most of the day, only a couple of hours in the afternoon where she is by herself. She feels her eyelids are drooping She reports L hearing loss, awaiting for a hearing aid She has physical therapy for left frozen shoulder She is preparing for total T AVR, as she is not a good candidate for conventional AVR    Patient feels  her memory is She lives with  Denies hallucinations or paranoia Mood is good, without depression or irritability Patient has been irritable Patient is feeling depressed Sleeping .   Denies vivid dreams  Patient dresses up and bathes without help   Denies missing medications  Patient's gives her the meds Patient uses a pillbox   Denies living objects in unusual places  Patient pays the bills Denies missing any payments or overpaying  Appetite is good denies any constipation or diarrhea, denies trouble swallowing.  The patient does not cook Denies leaving the stove on or the faucet on.  Ambulating with a walker Ambulating with cane Ambulating without difficulty without walker or cane  Denies headaches, trauma, or injuries to the head, double vision, numbness or tingling or unilateral weakness. Denies tremors.   Denies urinary incontinence or retention Has chronic urinary incontinence    History on Initial Assessment 06/19/2019: This is an 87 year old right-handed woman with a history of hypertension, hyperlipidemia, hypothyroidism, atrial fibrillation s/p PPM on anticoagulation with Eliquis, presenting for evaluation of memory loss. She is accompanied by her aide [name] who has only been with her for 2 months. I attempted to contact her daughter for more information, unable to reach her. She states she knows her memory is not good. She gets ready to call a friend and cannot think of their name. She had been living alone until Christmas 2019 after she had several falls. She stayed at [facility] then moved to a different home with almost  24/7 care ([facility]). She is alone from 4 to 8pm. Her daughter/POA lives in [place]. She has a son in [place]. Her daughter manages her checkbook, which aggravates her because she does not not what is going on. She reports she was good with bill payments. She stopped driving 2 years ago, stating her daughter just decided she did not want the  patient to drive. She manages her own medications, she fixes 4 boxes every week, aides watch her and report she does well with this. She denies misplacing things and does her own cooking at night. She denies leaving the stove on. [name] reports that she is mostly just forgetful, good with her medications. She is independent with dressing and bathing. No paranoia or hallucinations. She reports a fall when she pulled on a limb, then says "my daughter said it's not true."   PCP notes from 04/11/19 reviewed: "Pt does not seem to have overt symptoms and does have care givers to help her at home. Family dynamic seems challenging - daughter out of state who has one opinion of patient's capability vs care takers who feel she is fairly active and does a good job managing her medications. Discussed neurology referral for further evaluation of noted memory changes, but also encouraged her contact social services if she feels she should have more control - that if work-up shows good mental ability she could regain control of her own finances."  She denies any headaches, neck/back pain, focal tingling/weakness, bladder dysfunction, anosmia, tremors. She feels her eyes are blurred when her eyelids are heavy, sometimes making her feel dizzy. She denies any falls in the past 4 months. Sleep is okay. She has swelling in both legs. Her left leg has felt weaker for the past 2-3 months. She has tingling in her fingers.  Diagnostic Data: Head CT without contrast in 06/2019 showed moderate cerebral atrophy, chronic microvascular disease.  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 She had anosognosia with lack of insight into her condition, and felt incapable of managing high-level affairs. She was advised not to drive.     PREVIOUS MEDICATIONS: ***  CURRENT MEDICATIONS:  Outpatient Encounter Medications as of 07/30/2020  Medication Sig  . acetaminophen (TYLENOL) 500 MG tablet Take 1,000 mg by mouth every 8 (eight) hours as needed for moderate pain or headache.   Marland Kitchen atorvastatin (LIPITOR) 20 MG tablet TAKE 1 TABLET BY MOUTH EVERY DAY IN THE EVENING  . Carboxymethylcell-Hypromellose (GENTEAL OP) Place 1 drop into both eyes daily as needed (dry eyes).  . cetirizine (ZYRTEC) 5 MG tablet TAKE 1 TABLET BY MOUTH EVERY DAY  . Cyanocobalamin (B-12) 1000 MCG CAPS TAKE 1 CAPSULE BY MOUTH EVERY DAY  . D 2000 50 MCG (2000 UT) TABS TAKE 1 TABLET BY MOUTH EVERY DAY  . donepezil (ARICEPT) 10 MG tablet Take 1 tablet daily  . ELIQUIS 5 MG TABS tablet TAKE 1 TABLET BY MOUTH TWICE A DAY  . fluticasone (FLONASE) 50 MCG/ACT nasal spray PLACE 1 SPRAY INTO BOTH NOSTRILS DAILY AS NEEDED FOR ALLERGIES OR RHINITIS.  . furosemide (LASIX) 20 MG tablet TAKE 1 TABLET BY MOUTH EVERY DAY  . levothyroxine (SYNTHROID) 88 MCG tablet TAKE 1 TABLET BY MOUTH EVERY MORNING ON AN EMPTY STOMACH  . lisinopril (ZESTRIL) 30 MG tablet Take 1 tablet (30 mg total) by mouth daily.  Marland Kitchen  oxybutynin (DITROPAN-XL) 5 MG 24 hr tablet TAKE 1 TABLET BY MOUTH EVERY DAY  . sertraline (ZOLOFT) 50 MG tablet Take 1 tablet (50 mg total) by mouth at bedtime.   No facility-administered encounter medications on file as of 07/30/2020.     Objective:     PHYSICAL EXAMINATION:    VITALS:  There were no vitals filed for this visit.  GEN:  The patient appears stated age and is in NAD. HEENT:  Normocephalic, atraumatic.   Neurological examination:  Orientation: The patient is alert. Oriented to person, place and date *** Cranial nerves: There is good facial symmetry.The speech is fluent and clear***. No aphasia or dysarthria. Fund of knowledge is appropriate. Recent and remote memory are intact. Attention and concentration are normal. Able to name objects and repeat  phrases.  Hearing is intact to conversational tone.    Sensation: Sensation is intact to light touch throughout Motor: Strength is at least antigravity x4.  ***alert and oriented to person, place, and time. No aphasia or dysarthria. Fund of knowledge is appropriate.  Recent and remote memory are impaired, 2/3 delayed recall. Attention and concentration are normal, 5/5 WORLD backwards, 3/5 serial 7s. Cranial nerves: Pupils equal, round. Extraocular movements intact with no nystagmus. Visual fields full.  No facial asymmetry.  Motor: Bulk and tone normal, muscle strength 5/5 throughout with no pronator drift.   Finger to nose testing intact.  Gait slow and cautious with walker, no ataxia   Montreal Cognitive Assessment  07/15/2020  Visuospatial/ Executive (0/5) 2  Naming (0/3) 2  Attention: Read list of digits (0/2) 1  Attention: Read list of letters (0/1) 1  Attention: Serial 7 subtraction starting at 100 (0/3) 1  Language: Repeat phrase (0/2) 2  Language : Fluency (0/1) 1  Abstraction (0/2) 1  Delayed Recall (0/5) 0  Orientation (0/6) 4  Total 15     MMSE - Mini Mental State Exam 04/08/2020 07/18/2019  Not completed: Refused -  Orientation to time - 2  Orientation to Place - 4  Registration - 3  Attention/ Calculation - 5  Recall - 0  Language- name 2 objects - 2  Language- repeat - 1  Language- follow 3 step command - 2  Language- read & follow direction - 1  Write a sentence - 1  Copy design - 0  Total score - 21      Movement examination: Tone: There is normal tone in the UE/LE Abnormal movements:  no tremor.  No myoclonus.  No asterixis.   Coordination:  There is no decremation with RAM's. .  Gait and Station: The patient has no difficulty arising out of a deep-seated chair without the use of the hands. The patient's stride length is good.  Gait is cautious and narrow.    CBC CBC Latest Ref Rng & Units 07/21/2020 05/09/2020 04/29/2020  WBC 3.4 - 10.8 x10E3/uL 6.7 7.7 7.6   Hemoglobin 11.1 - 15.9 g/dL 10.8(L) 12.0 11.2  Hematocrit 34.0 - 46.6 % 33.5(L) 38.3 34.2  Platelets 150 - 450 x10E3/uL 196 192 188     CMP Latest Ref Rng & Units 07/21/2020 06/09/2020 05/09/2020  Glucose 65 - 99 mg/dL 99 117(H) 113(H)  BUN 8 - 27 mg/dL 24 16 15   Creatinine 0.57 - 1.00 mg/dL 1.35(H) 1.23(H) 1.15(H)  Sodium 134 - 144 mmol/L 137 137 132(L)  Potassium 3.5 - 5.2 mmol/L 4.0 3.7 3.9  Chloride 96 - 106 mmol/L 96 97 96(L)  CO2 20 - 29 mmol/L  28 32 26  Calcium 8.7 - 10.3 mg/dL 9.8 9.3 9.1  Total Protein 6.0 - 8.3 g/dL - - -  Total Bilirubin 0.2 - 1.2 mg/dL - - -  Alkaline Phos 39 - 117 U/L - - -  AST 0 - 37 U/L - - -  ALT 0 - 35 U/L - - -       Total time spent on today's visit was *** minutes, including both face-to-face time and nonface-to-face time.  Time included that spent on review of records (prior notes available to me/labs/imaging if pertinent), discussing treatment and goals, answering patient's questions and coordinating care.  Cc:  [name], MD Sharene Butters, PA-C

## 2020-07-30 ENCOUNTER — Telehealth: Payer: Self-pay | Admitting: *Deleted

## 2020-07-30 ENCOUNTER — Ambulatory Visit: Payer: Medicare Other | Admitting: Physician Assistant

## 2020-07-30 NOTE — Patient Instructions (Incomplete)
It was a pleasure to see you today at our office.   Recommendations:  Meds: Follow up in 6  months Continue Donepezil 10 mg daily  Continue Sertraline 50 mg daily   RECOMMENDATIONS FOR ALL PATIENTS WITH MEMORY PROBLEMS: 1. Continue to exercise (Recommend 30 minutes of walking everyday, or 3 hours every week) 2. Increase social interactions - continue going to [place] and enjoy social gatherings with friends and family 3. Eat healthy, avoid fried foods and eat more fruits and vegetables 4. Maintain adequate blood pressure, blood sugar, and blood cholesterol level. Reducing the risk of stroke and cardiovascular disease also helps promoting better memory. 5. Avoid stressful situations. Live a simple life and avoid aggravations. Organize your time and prepare for the next day in anticipation. 6. Sleep well, avoid any interruptions of sleep and avoid any distractions in the bedroom that may interfere with adequate sleep quality 7. Avoid sugar, avoid sweets as there is a strong link between excessive sugar intake, diabetes, and cognitive impairment We discussed the Mediterranean diet, which has been shown to help patients reduce the risk of progressive memory disorders and reduces cardiovascular risk. This includes eating fish, eat fruits and green leafy vegetables, nuts like almonds and hazelnuts, walnuts, and also use olive oil. Avoid fast foods and fried foods as much as possible. Avoid sweets and sugar as sugar use has been linked to worsening of memory function.  There is always a concern of gradual progression of memory problems. If this is the case, then we may need to adjust level of care according to patient needs. Support, both to the patient and caregiver, should then be put into place.    The Alzheimer's Association is here all day, every day for people facing Alzheimer's disease through our free 24/7 Helpline: (437)228-0636. The Helpline provides reliable information and support to all  those who need assistance, such as individuals living with memory loss, Alzheimer's or other dementia, caregivers, health care professionals and the public.  Our highly trained and knowledgeable staff can help you with: . Understanding memory loss, dementia and Alzheimer's  . Medications and other treatment options  . General information about aging and brain health  . Skills to provide quality care and to find the best care from professionals  . Legal, financial and living-arrangement decisions Our Helpline also features: . Confidential care consultation provided by master's level clinicians who can help with decision-making support, crisis assistance and education on issues families face every day  . Help in a caller's preferred language using our translation service that features more than 200 languages and dialects  . Referrals to local community programs, services and ongoing support     FALL PRECAUTIONS: Be cautious when walking. Scan the area for obstacles that may increase the risk of trips and falls. When getting up in the mornings, sit up at the edge of the bed for a few minutes before getting out of bed. Consider elevating the bed at the head end to avoid drop of blood pressure when getting up. Walk always in a well-lit room (use night lights in the walls). Avoid area rugs or power cords from appliances in the middle of the walkways. Use a walker or a cane if necessary and consider physical therapy for balance exercise. Get your eyesight checked regularly.    HOME SAFETY: Consider the safety of the kitchen when operating appliances like stoves, microwave oven, and blender. Consider having supervision and share cooking responsibilities until no longer able to participate in  those. Accidents with firearms and other hazards in the house should be identified and addressed as well.   ABILITY TO BE LEFT ALONE: If patient is unable to contact 911 operator, consider using LifeLine, or when  the need is there, arrange for someone to stay with patients. Smoking is a fire hazard, consider supervision or cessation. Risk of wandering should be assessed by caregiver and if detected at any point, supervision and safe proof recommendations should be instituted.     Mediterranean Diet A Mediterranean diet refers to food and lifestyle choices that are based on the traditions of countries located on the [employer]. This way of eating has been shown to help prevent certain conditions and improve outcomes for people who have chronic diseases, like kidney disease and heart disease. What are tips for following this plan? Lifestyle   Cook and eat meals together with your family, when possible.  Drink enough fluid to keep your urine clear or pale yellow.  Be physically active every day. This includes:  Aerobic exercise like running or swimming.  Leisure activities like gardening, walking, or housework.  Get 7-8 hours of sleep each night.  If recommended by your health care provider, drink red wine in moderation. This means 1 glass a day for nonpregnant women and 2 glasses a day for men. A glass of wine equals 5 oz (150 mL). Reading food labels   Check the serving size of packaged foods. For foods such as rice and pasta, the serving size refers to the amount of cooked product, not dry.  Check the total fat in packaged foods. Avoid foods that have saturated fat or trans fats.  Check the ingredients list for added sugars, such as corn syrup. Shopping   At the grocery store, buy most of your food from the areas near the walls of the store. This includes:  Fresh fruits and vegetables (produce).  Grains, beans, nuts, and seeds. Some of these may be available in unpackaged forms or large amounts (in bulk).  Fresh seafood.  Poultry and eggs.  Low-fat dairy products.  Buy whole ingredients instead of prepackaged foods.  Buy fresh fruits and vegetables in-season from local  farmers markets.  Buy frozen fruits and vegetables in resealable bags.  If you do not have access to quality fresh seafood, buy precooked frozen shrimp or canned fish, such as tuna, salmon, or sardines.  Buy small amounts of raw or cooked vegetables, salads, or olives from the deli or salad bar at your store.  Stock your pantry so you always have certain foods on hand, such as olive oil, canned tuna, canned tomatoes, rice, pasta, and beans. Cooking   Cook foods with extra-virgin olive oil instead of using butter or other vegetable oils.  Have meat as a side dish, and have vegetables or grains as your main dish. This means having meat in small portions or adding small amounts of meat to foods like pasta or stew.  Use beans or vegetables instead of meat in common dishes like chili or lasagna.  Experiment with different cooking methods. Try roasting or broiling vegetables instead of steaming or sauteing them.  Add frozen vegetables to soups, stews, pasta, or rice.  Add nuts or seeds for added healthy fat at each meal. You can add these to yogurt, salads, or vegetable dishes.  Marinate fish or vegetables using olive oil, lemon juice, garlic, and fresh herbs. Meal planning   Plan to eat 1 vegetarian meal one day each week. Try to  work up to 2 vegetarian meals, if possible.  Eat seafood 2 or more times a week.  Have healthy snacks readily available, such as:  Vegetable sticks with hummus.  Greek yogurt.  Fruit and nut trail mix.  Eat balanced meals throughout the week. This includes:  Fruit: 2-3 servings a day  Vegetables: 4-5 servings a day  Low-fat dairy: 2 servings a day  Fish, poultry, or lean meat: 1 serving a day  Beans and legumes: 2 or more servings a week  Nuts and seeds: 1-2 servings a day  Whole grains: 6-8 servings a day  Extra-virgin olive oil: 3-4 servings a day  Limit red meat and sweets to only a few servings a month What are my food  choices?  Mediterranean diet  Recommended  Grains: Whole-grain pasta. Brown rice. Bulgar wheat. Polenta. Couscous. Whole-wheat bread. [place].  Vegetables: Artichokes. Beets. Broccoli. Cabbage. Carrots. Eggplant. Green beans. Chard. Kale. Spinach. Onions. Leeks. Peas. Squash. Tomatoes. Peppers. Radishes.  Fruits: Apples. Apricots. Avocado. Berries. Bananas. Cherries. Dates. Figs. Grapes. Lemons. Melon. Oranges. Peaches. Plums. Pomegranate.  Meats and other protein foods: Beans. Almonds. Sunflower seeds. Pine nuts. Peanuts. Otoe. Salmon. Scallops. Shrimp. Garden. Tilapia. Clams. Oysters. Eggs.  Dairy: Low-fat milk. Cheese. Greek yogurt.  Beverages: Water. Red wine. Herbal tea.  Fats and oils: Extra virgin olive oil. Avocado oil. Grape seed oil.  Sweets and desserts: [place] yogurt with honey. Baked apples. Poached pears. Trail mix.  Seasoning and other foods: Basil. Cilantro. Coriander. Cumin. Mint. Parsley. Sage. Rosemary. Tarragon. Garlic. Oregano. Thyme. Pepper. Balsalmic vinegar. Tahini. Hummus. Tomato sauce. Olives. Mushrooms.  Limit these  Grains: Prepackaged pasta or rice dishes. Prepackaged cereal with added sugar.  Vegetables: Deep fried potatoes (french fries).  Fruits: Fruit canned in syrup.  Meats and other protein foods: Beef. Pork. Lamb. Poultry with skin. Hot dogs. Berniece Salines.  Dairy: Ice cream. Sour cream. Whole milk.  Beverages: Juice. Sugar-sweetened soft drinks. Beer. Liquor and spirits.  Fats and oils: Butter. Canola oil. Vegetable oil. Beef fat (tallow). Lard.  Sweets and desserts: Cookies. Cakes. Pies. Candy.  Seasoning and other foods: Mayonnaise. Premade sauces and marinades.  The items listed may not be a complete list. Talk with your dietitian about what dietary choices are right for you. Summary  The Mediterranean diet includes both food and lifestyle choices.  Eat a variety of fresh fruits and vegetables, beans, nuts, seeds, and whole  grains.  Limit the amount of red meat and sweets that you eat.  Talk with your health care provider about whether it is safe for you to drink red wine in moderation. This means 1 glass a day for nonpregnant women and 2 glasses a day for men. A glass of wine equals 5 oz (150 mL). This information is not intended to replace advice given to you by your health care provider. Make sure you discuss any questions you have with your health care provider. Document Released: 10/30/2015 Document Revised: 12/02/2015 Document Reviewed: 10/30/2015 Elsevier Interactive Patient Education  2017 [employer].

## 2020-07-30 NOTE — Telephone Encounter (Addendum)
Pt contacted pre-catheterization scheduled at [facility] for: Thursday Jul 31, 2020 12 Noon Verified arrival time and place: [facility]) at: 7 AM-pre-procedure hydration   No solid food after midnight prior to cath, clear liquids until 5 AM day of procedure.  Hold: Eliquis-none 07/31/20 until post procedure Lasix-day before and day of procedure-GFR 38 Lisinopril-day before and day of procedure-GFR 38  Except hold medications AM meds can be  taken pre-cath with sips of water including: ASA 81 mg   Confirmed patient has responsible adult to drive home post procedure and be with patient first 24 hours after arriving home: yes  You are allowed ONE visitor in the waiting room during the time you are at the hospital for your procedure. Both you and your visitor must wear a mask once you enter the hospital.   Reviewed procedure/mask/visitor instructions with patient and caregiver, Solmon Ice.

## 2020-07-31 ENCOUNTER — Encounter (HOSPITAL_COMMUNITY)
Admission: RE | Disposition: A | Discharge status: Home / Self Care | Payer: Self-pay | Source: Home / Self Care | Attending: Cardiovascular Disease

## 2020-07-31 ENCOUNTER — Ambulatory Visit (HOSPITAL_COMMUNITY)
Admission: RE | Admit: 2020-07-31 | Discharge: 2020-07-31 | Disposition: A | Discharge status: Home / Self Care | Payer: Medicare Other | Attending: Cardiovascular Disease | Admitting: Cardiovascular Disease

## 2020-07-31 ENCOUNTER — Other Ambulatory Visit: Payer: Self-pay

## 2020-07-31 DIAGNOSIS — I350 Nonrheumatic aortic (valve) stenosis: Secondary | ICD-10-CM

## 2020-07-31 DIAGNOSIS — Z888 Allergy status to other drugs, medicaments and biological substances status: Secondary | ICD-10-CM | POA: Diagnosis not present

## 2020-07-31 DIAGNOSIS — Z79899 Other long term (current) drug therapy: Secondary | ICD-10-CM | POA: Diagnosis not present

## 2020-07-31 DIAGNOSIS — Z7901 Long term (current) use of anticoagulants: Secondary | ICD-10-CM | POA: Diagnosis not present

## 2020-07-31 DIAGNOSIS — Z79890 Hormone replacement therapy (postmenopausal): Secondary | ICD-10-CM | POA: Diagnosis not present

## 2020-07-31 DIAGNOSIS — Z880 Allergy status to penicillin: Secondary | ICD-10-CM | POA: Diagnosis not present

## 2020-07-31 DIAGNOSIS — I352 Nonrheumatic aortic (valve) stenosis with insufficiency: Secondary | ICD-10-CM | POA: Diagnosis not present

## 2020-07-31 DIAGNOSIS — I2510 Atherosclerotic heart disease of native coronary artery without angina pectoris: Secondary | ICD-10-CM | POA: Insufficient documentation

## 2020-07-31 DIAGNOSIS — Z87891 Personal history of nicotine dependence: Secondary | ICD-10-CM | POA: Diagnosis not present

## 2020-07-31 HISTORY — PX: RIGHT/LEFT HEART CATH AND CORONARY ANGIOGRAPHY: CATH118266

## 2020-07-31 LAB — POCT I-STAT EG7
Acid-Base Excess: 2 mmol/L (ref 0.0–2.0)
Bicarbonate: 27.7 mmol/L (ref 20.0–28.0)
Calcium, Ion: 1.15 mmol/L (ref 1.15–1.40)
HCT: 33 % — ABNORMAL LOW (ref 36.0–46.0)
Hemoglobin: 11.2 g/dL — ABNORMAL LOW (ref 12.0–15.0)
O2 Saturation: 48 %
Potassium: 3.7 mmol/L (ref 3.5–5.1)
Sodium: 140 mmol/L (ref 135–145)
TCO2: 29 mmol/L (ref 22–32)
pCO2, Ven: 47.2 mmHg (ref 44.0–60.0)
pH, Ven: 7.376 (ref 7.250–7.430)
pO2, Ven: 27 mmHg — CL (ref 32.0–45.0)

## 2020-07-31 LAB — POCT I-STAT 7, (LYTES, BLD GAS, ICA,H+H)
Acid-Base Excess: 0 mmol/L (ref 0.0–2.0)
Bicarbonate: 25.2 mmol/L (ref 20.0–28.0)
Calcium, Ion: 1.15 mmol/L (ref 1.15–1.40)
HCT: 33 % — ABNORMAL LOW (ref 36.0–46.0)
Hemoglobin: 11.2 g/dL — ABNORMAL LOW (ref 12.0–15.0)
O2 Saturation: 97 %
Potassium: 3.5 mmol/L (ref 3.5–5.1)
Sodium: 140 mmol/L (ref 135–145)
TCO2: 26 mmol/L (ref 22–32)
pCO2 arterial: 41 mmHg (ref 32.0–48.0)
pH, Arterial: 7.396 (ref 7.350–7.450)
pO2, Arterial: 94 mmHg (ref 83.0–108.0)

## 2020-07-31 SURGERY — RIGHT/LEFT HEART CATH AND CORONARY ANGIOGRAPHY
Anesthesia: LOCAL

## 2020-07-31 MED ORDER — SODIUM CHLORIDE 0.9 % IV SOLN: INTRAVENOUS | Status: AC

## 2020-07-31 MED ORDER — ASPIRIN 81 MG PO CHEW: 81.0000 mg | CHEWABLE_TABLET | ORAL | Status: DC

## 2020-07-31 MED ORDER — SODIUM CHLORIDE 0.9 % IV SOLN: 250.0000 mL | INTRAVENOUS | Status: DC | PRN

## 2020-07-31 MED ORDER — FENTANYL CITRATE (PF) 100 MCG/2ML IJ SOLN
INTRAMUSCULAR | Status: DC | PRN
  Administered 2020-07-31: 25 ug via INTRAVENOUS

## 2020-07-31 MED ORDER — MIDAZOLAM HCL 2 MG/2ML IJ SOLN
INTRAMUSCULAR | Status: AC
  Filled 2020-07-31: qty 2

## 2020-07-31 MED ORDER — HEPARIN SODIUM (PORCINE) 1000 UNIT/ML IJ SOLN
INTRAMUSCULAR | Status: AC
  Filled 2020-07-31: qty 1

## 2020-07-31 MED ORDER — SODIUM CHLORIDE 0.9% FLUSH: 3.0000 mL | Freq: Two times a day (BID) | INTRAVENOUS | Status: DC

## 2020-07-31 MED ORDER — SODIUM CHLORIDE 0.9 % WEIGHT BASED INFUSION
3.0000 mL/kg/h | INTRAVENOUS | Status: AC
  Administered 2020-07-31: 3 mL/kg/h via INTRAVENOUS

## 2020-07-31 MED ORDER — LIDOCAINE HCL (PF) 1 % IJ SOLN
INTRAMUSCULAR | Status: AC
  Filled 2020-07-31: qty 30

## 2020-07-31 MED ORDER — HEPARIN (PORCINE) IN NACL 1000-0.9 UT/500ML-% IV SOLN
INTRAVENOUS | Status: AC
  Filled 2020-07-31: qty 1000

## 2020-07-31 MED ORDER — HYDRALAZINE HCL 20 MG/ML IJ SOLN: 10.0000 mg | INTRAMUSCULAR | Status: DC | PRN

## 2020-07-31 MED ORDER — VERAPAMIL HCL 2.5 MG/ML IV SOLN
INTRAVENOUS | Status: DC | PRN
  Administered 2020-07-31: 10 mL via INTRA_ARTERIAL

## 2020-07-31 MED ORDER — HEPARIN (PORCINE) IN NACL 1000-0.9 UT/500ML-% IV SOLN
INTRAVENOUS | Status: DC | PRN
  Administered 2020-07-31 (×2): 500 mL

## 2020-07-31 MED ORDER — LABETALOL HCL 5 MG/ML IV SOLN: 10.0000 mg | INTRAVENOUS | Status: DC | PRN

## 2020-07-31 MED ORDER — HEPARIN SODIUM (PORCINE) 1000 UNIT/ML IJ SOLN
INTRAMUSCULAR | Status: DC | PRN
  Administered 2020-07-31: 3500 [IU] via INTRAVENOUS

## 2020-07-31 MED ORDER — SODIUM CHLORIDE 0.9% FLUSH: 3.0000 mL | INTRAVENOUS | Status: DC | PRN

## 2020-07-31 MED ORDER — FENTANYL CITRATE (PF) 100 MCG/2ML IJ SOLN
INTRAMUSCULAR | Status: AC
  Filled 2020-07-31: qty 2

## 2020-07-31 MED ORDER — ACETAMINOPHEN 325 MG PO TABS: 650.0000 mg | ORAL_TABLET | ORAL | Status: DC | PRN

## 2020-07-31 MED ORDER — VERAPAMIL HCL 2.5 MG/ML IV SOLN
INTRAVENOUS | Status: AC
  Filled 2020-07-31: qty 2

## 2020-07-31 MED ORDER — MIDAZOLAM HCL 2 MG/2ML IJ SOLN
INTRAMUSCULAR | Status: DC | PRN
  Administered 2020-07-31: 0.5 mg via INTRAVENOUS

## 2020-07-31 MED ORDER — SODIUM CHLORIDE 0.9 % WEIGHT BASED INFUSION: 1.0000 mL/kg/h | INTRAVENOUS | Status: DC

## 2020-07-31 MED ORDER — ONDANSETRON HCL 4 MG/2ML IJ SOLN: 4.0000 mg | Freq: Four times a day (QID) | INTRAMUSCULAR | Status: DC | PRN

## 2020-07-31 MED ORDER — IOHEXOL 350 MG/ML SOLN
INTRAVENOUS | Status: DC | PRN
  Administered 2020-07-31: 70 mL

## 2020-07-31 MED ORDER — LIDOCAINE HCL (PF) 1 % IJ SOLN
INTRAMUSCULAR | Status: DC | PRN
  Administered 2020-07-31 (×2): 2 mL

## 2020-07-31 SURGICAL SUPPLY — 15 items
CATH 5FR JL3.5 JR4 ANG PIG MP (CATHETERS) ×2 IMPLANT
CATH BALLN WEDGE 5F 110CM (CATHETERS) ×2 IMPLANT
CATH INFINITI 5FR AL1 (CATHETERS) ×2 IMPLANT
DEVICE RAD TR BAND REGULAR (VASCULAR PRODUCTS) ×2 IMPLANT
GLIDESHEATH SLEND SS 6F .021 (SHEATH) ×2 IMPLANT
GUIDEWIRE INQWIRE 1.5J.035X260 (WIRE) ×1 IMPLANT
INQWIRE 1.5J .035X260CM (WIRE) ×2
KIT HEART LEFT (KITS) ×2 IMPLANT
PACK CARDIAC CATHETERIZATION (CUSTOM PROCEDURE TRAY) ×2 IMPLANT
SHEATH 6FR 85 DEST SLENDER (SHEATH) ×2 IMPLANT
SHEATH GLIDE SLENDER 4/5FR (SHEATH) ×2 IMPLANT
TRANSDUCER W/STOPCOCK (MISCELLANEOUS) ×2 IMPLANT
TUBING CIL FLEX 10 FLL-RA (TUBING) ×2 IMPLANT
WIRE EMERALD ST .035X150CM (WIRE) ×2 IMPLANT
WIRE HI TORQ VERSACORE-J 145CM (WIRE) ×2 IMPLANT

## 2020-07-31 NOTE — Discharge Instructions (Signed)
Radial Site Care  This sheet gives you information about how to care for yourself after your procedure. Your health care provider may also give you more specific instructions. If you have problems or questions, contact your health care provider. What can I expect after the procedure? After the procedure, it is common to have:  Bruising and tenderness at the catheter insertion area. Follow these instructions at home: Medicines  Take over-the-counter and prescription medicines only as told by your health care provider. Insertion site care 1. Follow instructions from your health care provider about how to take care of your insertion site. Make sure you: ? Wash your hands with soap and water before you remove your bandage (dressing). If soap and water are not available, use hand sanitizer. ? May remove dressing in 24 hours. 2. Check your insertion site every day for signs of infection. Check for: ? Redness, swelling, or pain. ? Fluid or blood. ? Pus or a bad smell. ? Warmth. 3. Do no take baths, swim, or use a hot tub for 5 days. 4. You may shower 24-48 hours after the procedure. ? Remove the dressing and gently wash the site with plain soap and water. ? Pat the area dry with a clean towel. ? Do not rub the site. That could cause bleeding. 5. Do not apply powder or lotion to the site. Activity  1. For 24 hours after the procedure, or as directed by your health care provider: ? Do not flex or bend the affected arm. ? Do not push or pull heavy objects with the affected arm. ? Do not drive yourself home from the hospital or clinic. You may drive 24 hours after the procedure. ? Do not operate machinery or power tools. ? KEEP ARM ELEVATED THE REMAINDER OF THE DAY. 2. Do not push, pull or lift anything that is heavier than 10 lb for 5 days. 3. Ask your health care provider when it is okay to: ? Return to work or school. ? Resume usual physical activities or sports. ? Resume sexual  activity. General instructions  If the catheter site starts to bleed, raise your arm and put firm pressure on the site. If the bleeding does not stop, get help right away. This is a medical emergency.  DRINK PLENTY OF FLUIDS FOR THE NEXT 2-3 DAYS.  No alcohol consumption for 24 hours after receiving sedation.  If you went home on the same day as your procedure, a responsible adult should be with you for the first 24 hours after you arrive home.  Keep all follow-up visits as told by your health care provider. This is important. Contact a health care provider if:  You have a fever.  You have redness, swelling, or yellow drainage around your insertion site. Get help right away if:  You have unusual pain at the radial site.  The catheter insertion area swells very fast.  The insertion area is bleeding, and the bleeding does not stop when you hold steady pressure on the area.  Your arm or hand becomes pale, cool, tingly, or numb. These symptoms may represent a serious problem that is an emergency. Do not wait to see if the symptoms will go away. Get medical help right away. Call your local emergency services (911 in the [place] Do not drive yourself to the hospital. Summary  After the procedure, it is common to have bruising and tenderness at the site.  Follow instructions from your health care provider about how to take care   of your radial site wound. Check the wound every day for signs of infection.  This information is not intended to replace advice given to you by your health care provider. Make sure you discuss any questions you have with your health care provider. Document Revised: 04/13/2017 Document Reviewed: 04/13/2017 Elsevier Patient Education  2020 Elsevier Inc. 

## 2020-07-31 NOTE — Interval H&P Note (Signed)
History and Physical Interval Note:  07/31/2020 10:05 AM  [name]  has presented today for surgery, with the diagnosis of aortic stenosis.  The various methods of treatment have been discussed with the patient and family. After consideration of risks, benefits and other options for treatment, the patient has consented to  Procedure(s): RIGHT/LEFT HEART CATH AND CORONARY ANGIOGRAPHY (N/A) as a surgical intervention.  The patient's history has been reviewed, patient examined, no change in status, stable for surgery.  I have reviewed the patient's chart and labs.  Questions were answered to the patient's satisfaction.    Cath Lab Visit (complete for each Cath Lab visit)  Clinical Evaluation Leading to the Procedure:   ACS: No.  Non-ACS:    Anginal Classification: CCS II  Anti-ischemic medical therapy: No Therapy  Non-Invasive Test Results: No non-invasive testing performed  Prior CABG: No previous CABG        Lauree Chandler

## 2020-08-01 ENCOUNTER — Other Ambulatory Visit: Payer: Self-pay

## 2020-08-01 ENCOUNTER — Encounter (HOSPITAL_COMMUNITY): Payer: Self-pay | Admitting: Cardiovascular Disease

## 2020-08-01 DIAGNOSIS — N289 Disorder of kidney and ureter, unspecified: Secondary | ICD-10-CM

## 2020-08-01 DIAGNOSIS — I350 Nonrheumatic aortic (valve) stenosis: Secondary | ICD-10-CM

## 2020-08-04 ENCOUNTER — Other Ambulatory Visit: Payer: Self-pay | Admitting: Family Medicine

## 2020-08-04 ENCOUNTER — Telehealth: Payer: Self-pay

## 2020-08-04 DIAGNOSIS — E039 Hypothyroidism, unspecified: Secondary | ICD-10-CM

## 2020-08-04 NOTE — Telephone Encounter (Signed)
Patient advised.

## 2020-08-04 NOTE — Chronic Care Management (AMB) (Addendum)
Chronic Care Management Pharmacy Assistant   Name: [name]  MRN: [id number] DOB: [birth year]  Reason for Encounter: Disease State HTN   Conditions to be addressed/monitored: HTN  Recent office visits:  07/15/2020  Dr.Cody PCP  No medication changes  Follow up 2 months 06/09/2020  Dr.Cody PCP  Labs (BMP) ordered  No medication changes follow up 4 weeks 06/05/2020  Dr.Cody PCP  Started furosemide 20mg  1 tablet daily(hold off if too lightheaded) come back in a few days  Recent consult visits:  07/31/2020  [facility]  [facility] 07/21/2020  Dr.Christopher McAlhany cardiology,labs ordered,no medication changes 06/12/2020  Tommye Standard PA-C Cardiology  No medication changes follow up 6 weeks  Hospital visits:  None in previous 6 months  Medications: Outpatient Encounter Medications as of 08/04/2020  Medication Sig   acetaminophen (TYLENOL) 500 MG tablet Take 1,000 mg by mouth every 8 (eight) hours as needed for moderate pain or headache.    atorvastatin (LIPITOR) 20 MG tablet TAKE 1 TABLET BY MOUTH EVERY DAY IN THE EVENING (Patient taking differently: Take 20 mg by mouth at bedtime.)   Carboxymethylcell-Hypromellose (GENTEAL OP) Place 1 drop into both eyes daily as needed (dry eyes).   cetirizine (ZYRTEC) 5 MG tablet TAKE 1 TABLET BY MOUTH EVERY DAY (Patient taking differently: Take 5 mg by mouth daily.)   Cyanocobalamin (B-12) 1000 MCG CAPS TAKE 1 CAPSULE BY MOUTH EVERY DAY (Patient taking differently: Take 1,000 mcg by mouth daily.)   D 2000 50 MCG (2000 UT) TABS TAKE 1 TABLET BY MOUTH EVERY DAY (Patient taking differently: Take 2,000 Units by mouth daily.)   donepezil (ARICEPT) 10 MG tablet Take 1 tablet daily (Patient taking differently: Take 10 mg by mouth at bedtime.)   ELIQUIS 5 MG TABS tablet TAKE 1 TABLET BY MOUTH TWICE A DAY (Patient taking differently: Take 5 mg by mouth 2 (two) times daily.)   fluticasone (FLONASE) 50 MCG/ACT nasal  spray PLACE 1 SPRAY INTO BOTH NOSTRILS DAILY AS NEEDED FOR ALLERGIES OR RHINITIS.   furosemide (LASIX) 20 MG tablet TAKE 1 TABLET BY MOUTH EVERY DAY (Patient taking differently: Take 20 mg by mouth daily.)   levothyroxine (SYNTHROID) 88 MCG tablet TAKE 1 TABLET BY MOUTH EVERY MORNING ON AN EMPTY STOMACH (Patient taking differently: Take 88 mcg by mouth daily before breakfast. EVERY MORNING ON AN EMPTY STOMACH)   lisinopril (ZESTRIL) 30 MG tablet Take 1 tablet (30 mg total) by mouth daily.   oxybutynin (DITROPAN-XL) 5 MG 24 hr tablet TAKE 1 TABLET BY MOUTH EVERY DAY (Patient taking differently: Take 5 mg by mouth at bedtime.)   sertraline (ZOLOFT) 50 MG tablet Take 1 tablet (50 mg total) by mouth at bedtime.   No facility-administered encounter medications on file as of 08/04/2020.    Recent Office Vitals: BP Readings from Last 3 Encounters:  07/31/20 (!) 144/75  07/21/20 134/84  07/15/20 (!) 148/90   Pulse Readings from Last 3 Encounters:  07/31/20 (!) 58  07/21/20 64  07/15/20 79    Wt Readings from Last 3 Encounters:  07/31/20 150 lb (68 kg)  07/21/20 150 lb 12.8 oz (68.4 kg)  07/15/20 150 lb 12 oz (68.4 kg)     Kidney Function Lab Results  Component Value Date/Time   CREATININE 1.35 (H) 07/21/2020 12:45 PM   CREATININE 1.23 (H) 06/09/2020 12:16 PM   GFR 39.49 (L) 06/09/2020 12:16 PM   GFRNONAA 46 (L) 05/09/2020 02:52 PM   GFRAA 49 (  L) 02/13/2019 12:00 AM    BMP Latest Ref Rng & Units 07/31/2020 07/31/2020 07/21/2020  Glucose 65 - 99 mg/dL - - 99  BUN 8 - 27 mg/dL - - 24  Creatinine 0.57 - 1.00 mg/dL - - 1.35(H)  BUN/Creat Ratio 12 - 28 - - 18  Sodium 135 - 145 mmol/L 140 140 137  Potassium 3.5 - 5.1 mmol/L 3.7 3.5 4.0  Chloride 96 - 106 mmol/L - - 96  CO2 20 - 29 mmol/L - - 28  Calcium 8.7 - 10.3 mg/dL - - 9.8   Current antihypertensive regimen:   Lisinopril 20 mg - 1 tablet daily  Patient verbally confirms she is taking the above medications as directed. Yes  How  often are you checking your Blood Pressure? 3-5x per week  she checks her blood pressure in the morning before taking her medication.  Current home BP readings:   DATE:             BP               PULSE  08/02/2020 138/85  71  08/04/2020 130/87  71  08/05/2020 104/59  66  Wrist or arm cuff: uses arm cuff Salt intake: does not add to food   Any readings above 180/120? No  What recent interventions/DTPs have been made by any provider to improve Blood Pressure control since last CPP Visit:  None identified  Any recent hospitalizations or ED visits since last visit with CPP? No   What diet changes have been made to improve Blood Pressure Control? The patient said she eats salads, greens, does not add salt to food  What exercise is being done to improve your Blood Pressure Control? The patient is limited due to recent cardiac catherization 07/31/2020. She reports she is feeling sluggish today.  Adherence Review: Is the patient currently on ACE/ARB medication? Yes Does the patient have >5 day gap between last estimated fill dates? No gaps    Star Rating Drugs:  Medication:  Last Fill: Day Supply Atorvastatin 20mg  06/27/2020 90  Lisinopril 30mg  05/13/2020 90   Follow-Up:  Pharmacist Review  Debbora Dus, CPP notified  Avel Sensor, [place] Assistant [phone]  I have reviewed the care management and care coordination activities outlined in this encounter and I am certifying that I agree with the content of this note. No further action required.  Debbora Dus, PharmD Clinical Pharmacist [facility] [phone]

## 2020-08-06 ENCOUNTER — Ambulatory Visit: Payer: Medicare Other | Admitting: Internal Medicine

## 2020-08-12 ENCOUNTER — Ambulatory Visit (HOSPITAL_BASED_OUTPATIENT_CLINIC_OR_DEPARTMENT_OTHER)
Admit: 2020-08-12 | Discharge: 2020-08-12 | Disposition: A | Discharge status: Home / Self Care | Payer: Medicare Other | Attending: Cardiovascular Disease | Admitting: Cardiovascular Disease

## 2020-08-12 ENCOUNTER — Encounter (HOSPITAL_COMMUNITY)
Admission: RE | Admit: 2020-08-12 | Discharge: 2020-08-12 | Disposition: A | Discharge status: Home / Self Care | Payer: Medicare Other | Source: Ambulatory Visit | Attending: Cardiovascular Disease | Admitting: Cardiovascular Disease

## 2020-08-12 ENCOUNTER — Ambulatory Visit (HOSPITAL_COMMUNITY)
Admit: 2020-08-12 | Discharge: 2020-08-12 | Disposition: A | Discharge status: Home / Self Care | Payer: Medicare Other | Attending: Cardiovascular Disease | Admitting: Cardiovascular Disease

## 2020-08-12 ENCOUNTER — Other Ambulatory Visit: Payer: Self-pay

## 2020-08-12 ENCOUNTER — Encounter (HOSPITAL_COMMUNITY): Payer: Self-pay

## 2020-08-12 DIAGNOSIS — I700 Atherosclerosis of aorta: Secondary | ICD-10-CM | POA: Diagnosis not present

## 2020-08-12 DIAGNOSIS — R160 Hepatomegaly, not elsewhere classified: Secondary | ICD-10-CM | POA: Diagnosis not present

## 2020-08-12 DIAGNOSIS — K760 Fatty (change of) liver, not elsewhere classified: Secondary | ICD-10-CM | POA: Diagnosis not present

## 2020-08-12 DIAGNOSIS — Z9049 Acquired absence of other specified parts of digestive tract: Secondary | ICD-10-CM | POA: Diagnosis not present

## 2020-08-12 DIAGNOSIS — R911 Solitary pulmonary nodule: Secondary | ICD-10-CM | POA: Diagnosis not present

## 2020-08-12 DIAGNOSIS — I350 Nonrheumatic aortic (valve) stenosis: Secondary | ICD-10-CM

## 2020-08-12 DIAGNOSIS — N289 Disorder of kidney and ureter, unspecified: Secondary | ICD-10-CM | POA: Insufficient documentation

## 2020-08-12 DIAGNOSIS — I517 Cardiomegaly: Secondary | ICD-10-CM | POA: Diagnosis not present

## 2020-08-12 LAB — BASIC METABOLIC PANEL
Anion gap: 10 (ref 5–15)
BUN: 16 mg/dL (ref 8–23)
CO2: 29 mmol/L (ref 22–32)
Calcium: 9.3 mg/dL (ref 8.9–10.3)
Chloride: 95 mmol/L — ABNORMAL LOW (ref 98–111)
Creatinine, Ser: 1.29 mg/dL — ABNORMAL HIGH (ref 0.44–1.00)
GFR, Estimated: 40 mL/min — ABNORMAL LOW (ref >60)
Glucose, Bld: 106 mg/dL — ABNORMAL HIGH (ref 70–99)
Potassium: 4.8 mmol/L (ref 3.5–5.1)
Sodium: 134 mmol/L — ABNORMAL LOW (ref 135–145)

## 2020-08-12 MED ORDER — SODIUM CHLORIDE 0.9 % WEIGHT BASED INFUSION: 1.0000 mL/kg/h | INTRAVENOUS | Status: DC

## 2020-08-12 MED ORDER — SODIUM CHLORIDE 0.9 % WEIGHT BASED INFUSION
3.0000 mL/kg/h | INTRAVENOUS | Status: AC
  Administered 2020-08-12: 2.926 mL/kg/h via INTRAVENOUS

## 2020-08-12 MED ORDER — IOHEXOL 350 MG/ML SOLN
95.0000 mL | Freq: Once | INTRAVENOUS | Status: AC | PRN
  Administered 2020-08-12: 95 mL via INTRAVENOUS

## 2020-08-12 NOTE — Progress Notes (Signed)
Carotid artery duplex has been completed. Preliminary results can be found in CV Proc through chart review.   08/12/20 1:22 PM [name] RVT

## 2020-08-12 NOTE — Progress Notes (Signed)
Called CT, spoke to [place], let her know patient is in room 3 and will be ready for CT at 1005

## 2020-08-13 ENCOUNTER — Emergency Department (HOSPITAL_COMMUNITY): Payer: Medicare Other

## 2020-08-13 ENCOUNTER — Ambulatory Visit (HOSPITAL_COMMUNITY)
Admission: EM | Admit: 2020-08-13 | Discharge: 2020-08-13 | Disposition: A | Discharge status: Other Acute Inpatient Hospital | Payer: Medicare Other

## 2020-08-13 ENCOUNTER — Emergency Department (HOSPITAL_COMMUNITY)
Admission: EM | Admit: 2020-08-13 | Discharge: 2020-08-13 | Disposition: A | Discharge status: Home / Self Care | Payer: Medicare Other | Attending: Emergency Medicine | Admitting: Emergency Medicine

## 2020-08-13 ENCOUNTER — Encounter (HOSPITAL_COMMUNITY): Payer: Self-pay | Admitting: *Deleted

## 2020-08-13 ENCOUNTER — Telehealth: Payer: Self-pay

## 2020-08-13 ENCOUNTER — Other Ambulatory Visit: Payer: Self-pay

## 2020-08-13 DIAGNOSIS — W19XXXA Unspecified fall, initial encounter: Secondary | ICD-10-CM | POA: Insufficient documentation

## 2020-08-13 DIAGNOSIS — I509 Heart failure, unspecified: Secondary | ICD-10-CM | POA: Insufficient documentation

## 2020-08-13 DIAGNOSIS — S7001XA Contusion of right hip, initial encounter: Secondary | ICD-10-CM | POA: Insufficient documentation

## 2020-08-13 DIAGNOSIS — M79641 Pain in right hand: Secondary | ICD-10-CM

## 2020-08-13 DIAGNOSIS — M1611 Unilateral primary osteoarthritis, right hip: Secondary | ICD-10-CM | POA: Diagnosis not present

## 2020-08-13 DIAGNOSIS — I110 Hypertensive heart disease with heart failure: Secondary | ICD-10-CM | POA: Diagnosis not present

## 2020-08-13 DIAGNOSIS — Z87891 Personal history of nicotine dependence: Secondary | ICD-10-CM | POA: Insufficient documentation

## 2020-08-13 DIAGNOSIS — G319 Degenerative disease of nervous system, unspecified: Secondary | ICD-10-CM | POA: Diagnosis not present

## 2020-08-13 DIAGNOSIS — E039 Hypothyroidism, unspecified: Secondary | ICD-10-CM | POA: Insufficient documentation

## 2020-08-13 DIAGNOSIS — M533 Sacrococcygeal disorders, not elsewhere classified: Secondary | ICD-10-CM | POA: Diagnosis not present

## 2020-08-13 DIAGNOSIS — S60031A Contusion of right middle finger without damage to nail, initial encounter: Secondary | ICD-10-CM | POA: Insufficient documentation

## 2020-08-13 DIAGNOSIS — I4891 Unspecified atrial fibrillation: Secondary | ICD-10-CM | POA: Diagnosis not present

## 2020-08-13 DIAGNOSIS — S6991XA Unspecified injury of right wrist, hand and finger(s), initial encounter: Secondary | ICD-10-CM | POA: Diagnosis present

## 2020-08-13 DIAGNOSIS — Z7901 Long term (current) use of anticoagulants: Secondary | ICD-10-CM | POA: Insufficient documentation

## 2020-08-13 DIAGNOSIS — S0990XA Unspecified injury of head, initial encounter: Secondary | ICD-10-CM | POA: Diagnosis not present

## 2020-08-13 DIAGNOSIS — M19041 Primary osteoarthritis, right hand: Secondary | ICD-10-CM | POA: Diagnosis not present

## 2020-08-13 DIAGNOSIS — Z955 Presence of coronary angioplasty implant and graft: Secondary | ICD-10-CM | POA: Insufficient documentation

## 2020-08-13 DIAGNOSIS — Z79899 Other long term (current) drug therapy: Secondary | ICD-10-CM | POA: Diagnosis not present

## 2020-08-13 DIAGNOSIS — Z950 Presence of cardiac pacemaker: Secondary | ICD-10-CM | POA: Insufficient documentation

## 2020-08-13 DIAGNOSIS — R609 Edema, unspecified: Secondary | ICD-10-CM | POA: Diagnosis not present

## 2020-08-13 DIAGNOSIS — I6782 Cerebral ischemia: Secondary | ICD-10-CM | POA: Diagnosis not present

## 2020-08-13 DIAGNOSIS — F0390 Unspecified dementia without behavioral disturbance: Secondary | ICD-10-CM | POA: Diagnosis not present

## 2020-08-13 DIAGNOSIS — M25551 Pain in right hip: Secondary | ICD-10-CM

## 2020-08-13 NOTE — Discharge Instructions (Addendum)
As discussed, it is normal to feel worse in the days immediately following a fall regardless of medication use.  However, please take all medication as directed, use ice packs liberally.  If you develop any new, or concerning changes in your condition, please return here for further evaluation and management.    Your hand wound has been closed with Steri-Strips.  These will fall off when the time is appropriate.  Otherwise, please return followup with your physician

## 2020-08-13 NOTE — Telephone Encounter (Signed)
New message    Patient came to the front desk asking to be seen by MD/ RN due to recent fall . Patient is aware that MD/ RN are still in clinic from [facility]   Patient is aware no available slots today. Patient did not accept appt for tomorrow .   Patient is aware to seek treatment at urgent care or emergency room .

## 2020-08-13 NOTE — ED Notes (Signed)
EMS has been called for patient.

## 2020-08-13 NOTE — ED Triage Notes (Signed)
Pt reports fall yesterday at home. Pt reports Injury to Rt middle.ring and small fingers . Pt reports RT hip is hurting after fall. Fingers on rt hand bruised.

## 2020-08-13 NOTE — ED Notes (Signed)
Pt wheeled to waiting room with family. Pt verbalized understanding of discharge instructions.

## 2020-08-13 NOTE — ED Triage Notes (Signed)
Pt bib ems from UC with reports of falling yesterday and now having R hip pain and R middle finger pain. Pt on eliquis.  VSS with EMS.

## 2020-08-13 NOTE — ED Provider Notes (Addendum)
[name]    CSN: [id number] Arrival date & time: 08/13/20  1202      History   Chief Complaint Chief Complaint  Patient presents with  . Fall  . Hip Injury    Rt  . Hand Pain    HPI [name] is a 87 y.o. female.   Patient presents with her aide and also her daughter who is healthcare power of attorney on phone  HPI  Fall: Patient fell yesterday at home.  She fell onto her right hip and right hand.  She is having severe pain of her right hip, right middle finger, right ring finger and small finger.  She states that she has a large bump on her right hip where she fell and that she has rubbed some Voltaren gel which has helped minimally.  She is on chronic anticoagulation.  She denies hitting her head or having loss of consciousness. She does have a history of multiple fractures.     Past Medical History:  Diagnosis Date  . Aortic stenosis   . Arrhythmia    chronic atrial fib  . Atrial fibrillation, chronic ([place])   . Chronic anticoagulation   . Chronic atrial fibrillation ([place]) 01/28/2017  . Chronic venous insufficiency 01/28/2017  . Clavicle fracture 11/20/2017  . Complication of anesthesia    " difficult waking "  . Long term current use of anticoagulant therapy 01/28/2017  . Osteoarthritis   . Presence of permanent cardiac pacemaker 01/28/2017   Original implant 1992 Lead [employer]medics 431-04 Generator change 2001 and 2011.  Medtronic generator.   . SSS (sick sinus syndrome) ([place])   . Thyroid disease    hypothyroidism  . TIA (transient ischemic attack)     Patient Active Problem List   Diagnosis Date Noted  . Severe aortic stenosis   . Aortic stenosis 07/15/2020  . CHF (congestive heart failure) ([place]) 06/05/2020  . Left sided abdominal pain 05/13/2020  . Sinus congestion 12/18/2019  . Dementia without behavioral disturbance ([place]) 07/31/2019  . Sick sinus syndrome ([place]) 07/28/2019  . Epigastric pain 06/12/2019  . Localized swelling of left  lower leg 04/11/2019  . Memory loss 04/11/2019  . Prediabetes 03/14/2019  . Complete heart block ([place]) 03/05/2019  . TIA (transient ischemic attack) 01/15/2019  . Gait abnormality 01/15/2019  . Transient ischemia 12/13/2018  . Dysarthria 12/11/2018  . Hypothyroidism 12/11/2018  . Arthritis of right hand 08/17/2018  . Chronic back pain 04/11/2018  . Encounter for therapeutic drug monitoring 03/09/2018  . Hypertension 01/04/2018  . Clavicle fracture 11/20/2017  . Closed fracture of clavicle 11/20/2017  . Fall   . Accelerated hypertension   . Carpal tunnel syndrome of left wrist 08/02/2017  . Carpal tunnel syndrome of right wrist 08/02/2017  . Bradycardia 02/02/2017  . Presence of permanent cardiac pacemaker 01/28/2017  . Chronic a-fib ([place]) 01/28/2017  . Long term current use of anticoagulant therapy 01/28/2017  . Chronic venous insufficiency 01/28/2017  . Chronic atrial fibrillation ([place]) 01/28/2017  . Peripheral venous insufficiency 01/28/2017  . Cardiac pacemaker in situ 01/28/2017  . Anticoagulated 01/28/2017  . DOE (dyspnea on exertion) 09/22/2015  . Dyspnea on exertion 09/22/2015  . 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 05/15/2014  . After cataract of left eye not obscuring vision 05/15/2014  . Bilateral dry eyes 05/15/2014  . Dermatochalasis of eyelid 05/15/2014  . Hyperopia of both eyes with astigmatism and presbyopia 05/15/2014  . Irregular astigmatism of right eye 05/15/2014  . Mechanical  complication due to intraocular lens implant 05/15/2014  . 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 Vitreous syneresis of both eyes 05/15/2014    Past Surgical History:  Procedure Laterality Date  . ABDOMINAL HYSTERECTOMY    . INSERT / REPLACE / REMOVE PACEMAKER     generator change 2011 medtronic sigma  . KNEE SURGERY    . LEAD INSERTION N/A 03/05/2019   Procedure: LEAD INSERTION - RV LEAD;  Surgeon: Deboraha Sprang, MD;  Location: [facility];  Service: Cardiovascular;   Laterality: N/A;  . PACEMAKER IMPLANT N/A 03/05/2019   Procedure: PACEMAKER IMPLANT;  Surgeon: Deboraha Sprang, MD;  Location: [facility];  Service: Cardiovascular;  Laterality: N/A;  . PACEMAKER INSERTION    . PARS PLANA VITRECTOMY Right 07/19/2018   Procedure: VITRECTOMY WITH VITREOUS BIPOSY & ENDOLASER;  Surgeon: Jalene Mullet, MD;  Location: [facility];  Service: Ophthalmology;  Laterality: Right;  . PPM GENERATOR CHANGEOUT N/A 02/02/2017   Procedure: PPM GENERATOR CHANGEOUT;  Surgeon: Deboraha Sprang, MD;  Location: [facility];  Service: Cardiovascular;  Laterality: N/A;  . REPLACEMENT TOTAL KNEE BILATERAL    . RIGHT/LEFT HEART CATH AND CORONARY ANGIOGRAPHY N/A 07/31/2020   Procedure: RIGHT/LEFT HEART CATH AND CORONARY ANGIOGRAPHY;  Surgeon: Burnell Blanks, MD;  Location: [facility];  Service: Cardiovascular;  Laterality: N/A;  . VARICOSE VEIN SURGERY      OB History    Gravida  3   Para  3   Term      Preterm      AB      Living  2     SAB      IAB      Ectopic      Multiple      Live Births               Home Medications    Prior to Admission medications   Medication Sig Start Date End Date Taking? Authorizing Provider  acetaminophen (TYLENOL) 500 MG tablet Take 1,000 mg by mouth every 8 (eight) hours as needed for moderate pain or headache.     [provider]  atorvastatin (LIPITOR) 20 MG tablet TAKE 1 TABLET BY MOUTH EVERY DAY IN THE EVENING Patient taking differently: Take 20 mg by mouth at bedtime. 06/30/20   [name], MD  Carboxymethylcell-Hypromellose (GENTEAL OP) Place 1 drop into both eyes daily as needed (dry eyes).    [provider]  cetirizine (ZYRTEC) 5 MG tablet TAKE 1 TABLET BY MOUTH EVERY DAY Patient taking differently: Take 5 mg by mouth daily. 04/18/20   [name], MD  Cyanocobalamin (B-12) 1000 MCG CAPS TAKE 1 CAPSULE BY MOUTH EVERY DAY Patient taking differently: Take 1,000 mcg  by mouth daily. 03/17/20   [name], MD  D 2000 50 MCG (2000 UT) TABS TAKE 1 TABLET BY MOUTH EVERY DAY Patient taking differently: Take 2,000 Units by mouth daily. 07/07/20   [name], MD  donepezil (ARICEPT) 10 MG tablet Take 1 tablet daily Patient taking differently: Take 10 mg by mouth at bedtime. 04/18/20   Cameron Sprang, MD  ELIQUIS 5 MG TABS tablet TAKE 1 TABLET BY MOUTH TWICE A DAY Patient taking differently: Take 5 mg by mouth 2 (two) times daily. 05/20/20   [name], MD  fluticasone (FLONASE) 50 MCG/ACT nasal spray PLACE 1 SPRAY INTO BOTH NOSTRILS DAILY AS NEEDED FOR ALLERGIES OR RHINITIS. 05/20/20   Einar Pheasant,  Jobe Marker, MD  furosemide (LASIX) 20 MG tablet TAKE 1 TABLET BY MOUTH EVERY DAY Patient taking differently: Take 20 mg by mouth daily. 07/23/20   [name], MD  levothyroxine (SYNTHROID) 88 MCG tablet TAKE 1 TABLET BY MOUTH EVERY DAY IN THE MORNING ON AN EMPTY STOMACH 08/04/20   [name], MD  lisinopril (ZESTRIL) 30 MG tablet Take 1 tablet (30 mg total) by mouth daily. 05/13/20   [name], MD  oxybutynin (DITROPAN-XL) 5 MG 24 hr tablet TAKE 1 TABLET BY MOUTH EVERY DAY Patient taking differently: Take 5 mg by mouth at bedtime. 06/05/20   [name], MD  sertraline (ZOLOFT) 50 MG tablet Take 1 tablet (50 mg total) by mouth at bedtime. 05/08/20   Cameron Sprang, MD    Family History Family History  Problem Relation Age of Onset  . Diabetes Mother   . Mental illness Mother   . Diabetes Father   . Heart disease Father     Social History Social History   Tobacco Use  . Smoking status: Former Smoker    Packs/day: 0.25    Years: 10.00    Pack years: 2.50    Types: Cigarettes    Quit date: 1980    Years since quitting: 42.4  . Smokeless tobacco: Never Used  Vaping Use  . Vaping Use: Never used  Substance Use Topics  . Alcohol use: No  . Drug use: No     Allergies   Adhesive [tape] and Penicillins   Review of Systems Review of  Systems  As stated above in HPI Physical Exam Triage Vital Signs ED Triage Vitals  Enc Vitals Group     BP 08/13/20 1252 115/70     Pulse Rate 08/13/20 1252 89     Resp 08/13/20 1252 18     Temp 08/13/20 1252 (!) 97.4 F (36.3 C)     Temp src --      SpO2 08/13/20 1252 98 %     Weight --      Height --      Head Circumference --      Peak Flow --      Pain Score 08/13/20 1249 5     Pain Loc --      Pain Edu? --      Excl. in [place]? --    No data found.  Updated Vital Signs BP 115/70   Pulse 89   Temp (!) 97.4 F (36.3 C)   Resp 18   SpO2 98%   Physical Exam Vitals and nursing note reviewed.  Constitutional:      General: She is not in acute distress.    Appearance: Normal appearance. She is not ill-appearing, toxic-appearing or diaphoretic.  Musculoskeletal:        General: Swelling (right hip and right middle finger) and tenderness (Multiple areas of the right hand and right hip) present.  Skin:    Findings: Bruising present.     Comments: Large skin abrasion of the right lateral hand without active bleeding.  There is ecchymosis of multiple fingers especially at the distal right middle finger.  There is mild pallor at the distalmost aspect of the right middle finger.  No evidence of skin breakdown on the middle finger. Large hematoma of the right hip  Neurological:     Mental Status: She is alert and oriented to person, place, and time.      UC Treatments / Results  Labs (  all labs ordered are listed, but only abnormal results are displayed) Labs Reviewed - No data to display  EKG   Radiology  Procedures Procedures (including critical care time)  Medications Ordered in UC Medications - No data to display  Initial Impression / Assessment and Plan / UC Course  I have reviewed the triage vital signs and the nursing notes.  Pertinent labs & imaging results that were available during my care of the patient were reviewed by me and considered in my medical  decision making (see chart for details).     New.  I have multiple concerns regarding patient.  #1 of concern for potential hip fracture and given her history she will likely need CT imaging of the hip and pelvis.  Furthermore I am concerned that she may be developing compartment syndrome of her right middle finger.  I discussed both of these concerns with patient and her healthcare power of attorney.  It is in agreement that she proceed to the emergency room for further work-up and treatment to avoid further injury.  They arrived in a wheelchair and a cane as she is not able to ambulate at all on her own right now due to pain.  We have elected to have her transferred via EMS.  Final Clinical Impressions(s) / UC Diagnoses   Final diagnoses:  None   Discharge Instructions   None    ED Prescriptions    None     PDMP not reviewed this encounter.   Hughie Closs, PA-C 08/13/20 [place], [address], [place] 08/13/20 1322

## 2020-08-13 NOTE — ED Provider Notes (Signed)
[facility] EMERGENCY DEPARTMENT Provider Note   CSN: [id number] Arrival date & time: 08/13/20  1403     History Chief Complaint  Patient presents with  . Hip Pain  . Fall    [name] is a 87 y.o. female.  HPI Patient presents from urgent care due to a fall that occurred yesterday with ongoing pain.  Patient is on blood thinning medication for history of A. fib.  She notes that yesterday she had a mechanical fall.  She recalls falling onto her right hip, right hand.  She denies head trauma, neck pain.  Since that time she has had pain in all the distal digits of her right hand except for the thumb, and her right hip.  She has had minimal help with Voltaren gel.  She has been ambulatory.  She has no new weakness in any extremity.  History is obtained by the patient and chart review from urgent care earlier today.    Past Medical History:  Diagnosis Date  . Aortic stenosis   . Arrhythmia    chronic atrial fib  . Atrial fibrillation, chronic ([place])   . Chronic anticoagulation   . Chronic atrial fibrillation ([place]) 01/28/2017  . Chronic venous insufficiency 01/28/2017  . Clavicle fracture 11/20/2017  . Complication of anesthesia    " difficult waking "  . Long term current use of anticoagulant therapy 01/28/2017  . Osteoarthritis   . Presence of permanent cardiac pacemaker 01/28/2017   Original implant 1992 Lead Intermedics 431-04 Generator change 2001 and 2011.  Medtronic generator.   . SSS (sick sinus syndrome) ([place])   . Thyroid disease    hypothyroidism  . TIA (transient ischemic attack)     Patient Active Problem List   Diagnosis Date Noted  . Severe aortic stenosis   . Aortic stenosis 07/15/2020  . CHF (congestive heart failure) ([place]) 06/05/2020  . Left sided abdominal pain 05/13/2020  . Sinus congestion 12/18/2019  . Dementia without behavioral disturbance ([place]) 07/31/2019  . Sick sinus syndrome ([place]) 07/28/2019  . Epigastric pain  06/12/2019  . Localized swelling of left lower leg 04/11/2019  . Memory loss 04/11/2019  . Prediabetes 03/14/2019  . Complete heart block ([place]) 03/05/2019  . TIA (transient ischemic attack) 01/15/2019  . Gait abnormality 01/15/2019  . Transient ischemia 12/13/2018  . Dysarthria 12/11/2018  . Hypothyroidism 12/11/2018  . Arthritis of right hand 08/17/2018  . Chronic back pain 04/11/2018  . Encounter for therapeutic drug monitoring 03/09/2018  . Hypertension 01/04/2018  . Clavicle fracture 11/20/2017  . Closed fracture of clavicle 11/20/2017  . Fall   . Accelerated hypertension   . Carpal tunnel syndrome of left wrist 08/02/2017  . Carpal tunnel syndrome of right wrist 08/02/2017  . Bradycardia 02/02/2017  . Presence of permanent cardiac pacemaker 01/28/2017  . Chronic a-fib ([place]) 01/28/2017  . Long term current use of anticoagulant therapy 01/28/2017  . Chronic venous insufficiency 01/28/2017  . Chronic atrial fibrillation ([place]) 01/28/2017  . Peripheral venous insufficiency 01/28/2017  . Cardiac pacemaker in situ 01/28/2017  . Anticoagulated 01/28/2017  . DOE (dyspnea on exertion) 09/22/2015  . Dyspnea on exertion 09/22/2015  . 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 05/15/2014  . After cataract of left eye not obscuring vision 05/15/2014  . Bilateral dry eyes 05/15/2014  . Dermatochalasis of eyelid 05/15/2014  . Hyperopia of both eyes with astigmatism and presbyopia 05/15/2014  . Irregular astigmatism of right eye 05/15/2014  . Mechanical complication due  to intraocular lens implant 05/15/2014  . 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 Vitreous syneresis of both eyes 05/15/2014    Past Surgical History:  Procedure Laterality Date  . ABDOMINAL HYSTERECTOMY    . INSERT / REPLACE / REMOVE PACEMAKER     generator change 2011 medtronic sigma  . KNEE SURGERY    . LEAD INSERTION N/A 03/05/2019   Procedure: LEAD INSERTION - RV LEAD;  Surgeon: Deboraha Sprang, MD;  Location: [facility]  CV LAB;  Service: Cardiovascular;  Laterality: N/A;  . PACEMAKER IMPLANT N/A 03/05/2019   Procedure: PACEMAKER IMPLANT;  Surgeon: Deboraha Sprang, MD;  Location: [facility];  Service: Cardiovascular;  Laterality: N/A;  . PACEMAKER INSERTION    . PARS PLANA VITRECTOMY Right 07/19/2018   Procedure: VITRECTOMY WITH VITREOUS BIPOSY & ENDOLASER;  Surgeon: Jalene Mullet, MD;  Location: [facility];  Service: Ophthalmology;  Laterality: Right;  . PPM GENERATOR CHANGEOUT N/A 02/02/2017   Procedure: PPM GENERATOR CHANGEOUT;  Surgeon: Deboraha Sprang, MD;  Location: [facility];  Service: Cardiovascular;  Laterality: N/A;  . REPLACEMENT TOTAL KNEE BILATERAL    . RIGHT/LEFT HEART CATH AND CORONARY ANGIOGRAPHY N/A 07/31/2020   Procedure: RIGHT/LEFT HEART CATH AND CORONARY ANGIOGRAPHY;  Surgeon: Burnell Blanks, MD;  Location: [facility];  Service: Cardiovascular;  Laterality: N/A;  . VARICOSE VEIN SURGERY       OB History    Gravida  3   Para  3   Term      Preterm      AB      Living  2     SAB      IAB      Ectopic      Multiple      Live Births              Family History  Problem Relation Age of Onset  . Diabetes Mother   . Mental illness Mother   . Diabetes Father   . Heart disease Father     Social History   Tobacco Use  . Smoking status: Former Smoker    Packs/day: 0.25    Years: 10.00    Pack years: 2.50    Types: Cigarettes    Quit date: 1980    Years since quitting: 42.4  . Smokeless tobacco: Never Used  Vaping Use  . Vaping Use: Never used  Substance Use Topics  . Alcohol use: No  . Drug use: No    Home Medications Prior to Admission medications   Medication Sig Start Date End Date Taking? Authorizing Provider  acetaminophen (TYLENOL) 500 MG tablet Take 1,000 mg by mouth every 8 (eight) hours as needed for moderate pain or headache.     [provider]  atorvastatin (LIPITOR) 20 MG tablet TAKE 1 TABLET BY MOUTH  EVERY DAY IN THE EVENING Patient taking differently: Take 20 mg by mouth at bedtime. 06/30/20   [name], MD  Carboxymethylcell-Hypromellose (GENTEAL OP) Place 1 drop into both eyes daily as needed (dry eyes).    [provider]  cetirizine (ZYRTEC) 5 MG tablet TAKE 1 TABLET BY MOUTH EVERY DAY Patient taking differently: Take 5 mg by mouth daily. 04/18/20   [name], MD  Cyanocobalamin (B-12) 1000 MCG CAPS TAKE 1 CAPSULE BY MOUTH EVERY DAY Patient taking differently: Take 1,000 mcg by mouth daily. 03/17/20   [name], MD  D 2000 50 MCG (2000 UT) TABS TAKE  1 TABLET BY MOUTH EVERY DAY Patient taking differently: Take 2,000 Units by mouth daily. 07/07/20   [name], MD  donepezil (ARICEPT) 10 MG tablet Take 1 tablet daily Patient taking differently: Take 10 mg by mouth at bedtime. 04/18/20   Cameron Sprang, MD  ELIQUIS 5 MG TABS tablet TAKE 1 TABLET BY MOUTH TWICE A DAY Patient taking differently: Take 5 mg by mouth 2 (two) times daily. 05/20/20   [name], MD  fluticasone (FLONASE) 50 MCG/ACT nasal spray PLACE 1 SPRAY INTO BOTH NOSTRILS DAILY AS NEEDED FOR ALLERGIES OR RHINITIS. 05/20/20   [name], MD  furosemide (LASIX) 20 MG tablet TAKE 1 TABLET BY MOUTH EVERY DAY Patient taking differently: Take 20 mg by mouth daily. 07/23/20   [name], MD  levothyroxine (SYNTHROID) 88 MCG tablet TAKE 1 TABLET BY MOUTH EVERY DAY IN THE MORNING ON AN EMPTY STOMACH 08/04/20   [name], MD  lisinopril (ZESTRIL) 30 MG tablet Take 1 tablet (30 mg total) by mouth daily. 05/13/20   [name], MD  oxybutynin (DITROPAN-XL) 5 MG 24 hr tablet TAKE 1 TABLET BY MOUTH EVERY DAY Patient taking differently: Take 5 mg by mouth at bedtime. 06/05/20   [name], MD  sertraline (ZOLOFT) 50 MG tablet Take 1 tablet (50 mg total) by mouth at bedtime. 05/08/20   Cameron Sprang, MD    Allergies    Adhesive [tape] and Penicillins  Review of Systems   Review of  Systems  Constitutional:       Per HPI, otherwise negative  HENT:       Per HPI, otherwise negative  Respiratory:       Per HPI, otherwise negative  Cardiovascular:       Per HPI, otherwise negative  Gastrointestinal: Negative for vomiting.  Endocrine:       Negative aside from HPI  Genitourinary:       Neg aside from HPI   Musculoskeletal:       Per HPI, otherwise negative  Skin: Negative.   Neurological: Negative for syncope.  Hematological: Bruises/bleeds easily.    Physical Exam Updated Vital Signs BP (!) 152/93 (BP Location: Left Arm)   Pulse 93   Temp (!) 97.5 F (36.4 C) (Oral)   Resp 16   SpO2 93%   Physical Exam Vitals and nursing note reviewed.  Constitutional:      General: She is not in acute distress.    Appearance: Normal appearance. She is not ill-appearing, toxic-appearing or diaphoretic.  Musculoskeletal:        General: Swelling (right hip and right middle finger) present.       Arms:       Back:  Skin:    Findings: Bruising present.     Comments: Large skin abrasion of the right lateral hand without active bleeding.  There is ecchymosis of multiple fingers especially at the distal right middle finger.  There is mild pallor at the distalmost aspect of the right middle finger.  No evidence of skin breakdown on the middle finger. Large hematoma of the right hip  Neurological:     Mental Status: She is alert and oriented to person, place, and time.     ED Results / Procedures / Treatments    Radiology CT Head Wo Contrast  Result Date: 08/13/2020 CLINICAL DATA:  Poly trauma. EXAM: CT HEAD WITHOUT CONTRAST TECHNIQUE: Contiguous axial images were obtained from the base of the skull  through the vertex without intravenous contrast. COMPARISON:  CT head July 16, 2019. FINDINGS: Brain: No evidence of acute large vascular territory infarction, hemorrhage, hydrocephalus, extra-axial collection or mass lesion/mass effect. Patchy white matter  hypoattenuation, likely related to chronic microvascular ischemic disease. Moderate atrophy. Vascular: No hyperdense vessel.  Calcific atherosclerosis. Skull: No acute fracture. Sinuses/Orbits: Clear sinuses.  Unremarkable orbits. Other: No mastoid effusions. IMPRESSION: 1. No evidence of acute intracranial abnormality. 2. Atrophy and chronic microvascular ischemic disease. Electronically Signed   By: Margaretha Sheffield MD   On: 08/13/2020 15:05     DG Hand Complete Right  Result Date: 08/13/2020 CLINICAL DATA:  Fall and posterior hand pain. EXAM: RIGHT HAND - COMPLETE 3+ VIEW COMPARISON:  Right wrist 11/21/2017 FINDINGS: Sclerosis and degenerative changes at the first carpometacarpal joint. Chondrocalcinosis along the ulnar aspect of the wrist. Negative for an acute fracture or dislocation. No focal soft tissue abnormality. Joint space narrowing involving the second, third, fourth and fifth DIP joints. IMPRESSION: Osteoarthritis in the right hand without acute bone abnormality. Electronically Signed   By: Markus Daft M.D.   On: 08/13/2020 14:51     DG Hip Unilat With Pelvis 2-3 Views Right  Result Date: 08/13/2020 CLINICAL DATA:  Post fall with right lateral hip pain. EXAM: DG HIP (WITH OR WITHOUT PELVIS) 2-3V RIGHT COMPARISON:  Abdominopelvic CT reformats 05/30/2020 FINDINGS: No acute pelvic or hip fracture. Advanced right hip osteoarthritis with severe joint space narrowing, osteophytes, and subchondral cystic change. Pubic rami are intact. Pubic symphysis and sacroiliac joints are congruent. There are vascular calcifications. IMPRESSION: 1. No acute fracture of the pelvis or right hip. 2. Advanced right hip osteoarthritis. Electronically Signed   By: Keith Rake M.D.   On: 08/13/2020 14:51    Procedures Procedures   Medications Ordered in ED Medications - No data to display  ED Course  I have reviewed the triage vital signs and the nursing notes.  Pertinent labs & imaging results that  were available during my care of the patient were reviewed by me and considered in my medical decision making (see chart for details).  I reviewed exam patient's right hand wound identified, no separation, wound will have closure with Steri-Strips by nursing staff.  Given the passage of the day since the event, wound is not appropriate for sutures.   On repeat exam patient is in no distress.  Today's CT scan, x-rays reviewed.  Notably, the patient has had multiple CT scans performed yesterday as well given her history of TAVR. Now, 1 day after fall, with no new weakness, pain in her hand, hip, but no evidence for fracture, distal neurovascular compromise, the patient is appropriate for discharge with outpatient follow-up.  Final Clinical Impression(s) / ED Diagnoses Final diagnoses:  Fall, initial encounter      Carmin Muskrat, MD 08/13/20 [phone]

## 2020-08-14 ENCOUNTER — Other Ambulatory Visit: Payer: Self-pay | Admitting: Family Medicine

## 2020-08-15 ENCOUNTER — Ambulatory Visit (INDEPENDENT_AMBULATORY_CARE_PROVIDER_SITE_OTHER): Payer: Medicare Other

## 2020-08-15 DIAGNOSIS — I495 Sick sinus syndrome: Secondary | ICD-10-CM | POA: Diagnosis not present

## 2020-08-16 LAB — CUP PACEART REMOTE DEVICE CHECK
Battery Remaining Longevity: 142 mo
Battery Voltage: 3.05 V
Brady Statistic RV Percent Paced: 53.69 %
Date Time Interrogation Session: 20220527150822
Implantable Lead Implant Date: 20201214
Implantable Lead Location: 753860
Implantable Lead Model: 1948
Implantable Pulse Generator Implant Date: 20201214
Lead Channel Impedance Value: 399 Ohm
Lead Channel Impedance Value: 551 Ohm
Lead Channel Pacing Threshold Amplitude: 0.875 V
Lead Channel Pacing Threshold Pulse Width: 0.4 ms
Lead Channel Sensing Intrinsic Amplitude: 5.625 mV
Lead Channel Sensing Intrinsic Amplitude: 5.625 mV
Lead Channel Setting Pacing Amplitude: 2.5 V
Lead Channel Setting Pacing Pulse Width: 0.4 ms
Lead Channel Setting Sensing Sensitivity: 1.2 mV

## 2020-08-20 ENCOUNTER — Other Ambulatory Visit: Payer: Self-pay | Admitting: Family Medicine

## 2020-08-20 DIAGNOSIS — I509 Heart failure, unspecified: Secondary | ICD-10-CM

## 2020-08-20 NOTE — Telephone Encounter (Signed)
Last refilled on 07/23/20 # 30 with 1 refill. Request for future refills. LOV 07/15/20 for follow up routine Next appointment on 10/06/20 for 2 months follow up

## 2020-08-27 ENCOUNTER — Other Ambulatory Visit (HOSPITAL_COMMUNITY): Payer: Medicare Other

## 2020-08-29 NOTE — Progress Notes (Signed)
Remote pacemaker transmission.   

## 2020-09-02 ENCOUNTER — Ambulatory Visit: Payer: Medicare Other | Admitting: Family Medicine

## 2020-09-04 ENCOUNTER — Ambulatory Visit (INDEPENDENT_AMBULATORY_CARE_PROVIDER_SITE_OTHER): Payer: Medicare Other | Admitting: Physician Assistant

## 2020-09-04 ENCOUNTER — Other Ambulatory Visit: Payer: Self-pay

## 2020-09-04 ENCOUNTER — Encounter: Payer: Self-pay | Admitting: Physician Assistant

## 2020-09-04 ENCOUNTER — Ambulatory Visit: Payer: Medicare Other | Admitting: Physician Assistant

## 2020-09-04 DIAGNOSIS — F03B18 Unspecified dementia, moderate, with other behavioral disturbance: Secondary | ICD-10-CM

## 2020-09-04 DIAGNOSIS — F0391 Unspecified dementia with behavioral disturbance: Secondary | ICD-10-CM | POA: Diagnosis not present

## 2020-09-04 NOTE — Patient Instructions (Signed)
It was a pleasure to see you today at our office.   Recommendations:  Follow up with Dr. Delice Lesch as scheduled OK to go through surgery form the Neurology standpoint.   RECOMMENDATIONS FOR ALL PATIENTS WITH MEMORY PROBLEMS: 1. Continue to exercise (Recommend 30 minutes of walking everyday, or 3 hours every week) 2. Increase social interactions - continue going to [place] and enjoy social gatherings with friends and family 3. Eat healthy, avoid fried foods and eat more fruits and vegetables 4. Maintain adequate blood pressure, blood sugar, and blood cholesterol level. Reducing the risk of stroke and cardiovascular disease also helps promoting better memory. 5. Avoid stressful situations. Live a simple life and avoid aggravations. Organize your time and prepare for the next day in anticipation. 6. Sleep well, avoid any interruptions of sleep and avoid any distractions in the bedroom that may interfere with adequate sleep quality 7. Avoid sugar, avoid sweets as there is a strong link between excessive sugar intake, diabetes, and cognitive impairment We discussed the Mediterranean diet, which has been shown to help patients reduce the risk of progressive memory disorders and reduces cardiovascular risk. This includes eating fish, eat fruits and green leafy vegetables, nuts like almonds and hazelnuts, walnuts, and also use olive oil. Avoid fast foods and fried foods as much as possible. Avoid sweets and sugar as sugar use has been linked to worsening of memory function.  There is always a concern of gradual progression of memory problems. If this is the case, then we may need to adjust level of care according to patient needs. Support, both to the patient and caregiver, should then be put into place.      You have been referred for a neuropsychological evaluation (i.e., evaluation of memory and thinking abilities). Please bring someone with you to this appointment if possible, as it is helpful for the  doctor to hear from both you and another adult who knows you well. Please bring eyeglasses and hearing aids if you wear them.    The evaluation will take approximately 3 hours and has two parts:   The first part is a clinical interview with the neuropsychologist (Dr. Melvyn Novas or Dr. Nicole Kindred). During the interview, the neuropsychologist will speak with you and the individual you brought to the appointment.    The second part of the evaluation is testing with the doctor's technician Hinton Dyer or Maudie Mercury). During the testing, the technician will ask you to remember different types of material, solve problems, and answer some questionnaires. Your family member will not be present for this portion of the evaluation.   Please note: We must reserve several hours of the neuropsychologist's time and the psychometrician's time for your evaluation appointment. As such, there is a No-Show fee of $100. If you are unable to attend any of your appointments, please contact our office as soon as possible to reschedule.    FALL PRECAUTIONS: Be cautious when walking. Scan the area for obstacles that may increase the risk of trips and falls. When getting up in the mornings, sit up at the edge of the bed for a few minutes before getting out of bed. Consider elevating the bed at the head end to avoid drop of blood pressure when getting up. Walk always in a well-lit room (use night lights in the walls). Avoid area rugs or power cords from appliances in the middle of the walkways. Use a walker or a cane if necessary and consider physical therapy for balance exercise. Get your eyesight checked  regularly.  FINANCIAL OVERSIGHT: Supervision, especially oversight when making financial decisions or transactions is also recommended.  HOME SAFETY: Consider the safety of the kitchen when operating appliances like stoves, microwave oven, and blender. Consider having supervision and share cooking responsibilities until no longer able to  participate in those. Accidents with firearms and other hazards in the house should be identified and addressed as well.   ABILITY TO BE LEFT ALONE: If patient is unable to contact 911 operator, consider using LifeLine, or when the need is there, arrange for someone to stay with patients. Smoking is a fire hazard, consider supervision or cessation. Risk of wandering should be assessed by caregiver and if detected at any point, supervision and safe proof recommendations should be instituted.  MEDICATION SUPERVISION: Inability to self-administer medication needs to be constantly addressed. Implement a mechanism to ensure safe administration of the medications.   DRIVING: Regarding driving, in patients with progressive memory problems, driving will be impaired. We advise to have someone else do the driving if trouble finding directions or if minor accidents are reported. Independent driving assessment is available to determine safety of driving.   If you are interested in the driving assessment, you can contact the following:  [employer] in [place]  [place] [phone] or [phone]    [place] refers to food and lifestyle choices that are based on the traditions of countries located on the [employer]. This way of eating has been shown to help prevent certain conditions and improve outcomes for people who have chronic diseases, like kidney disease and heart disease. What are tips for following this plan? Lifestyle  Cook and eat meals together with your family, when possible. Drink enough fluid to keep your urine clear or pale yellow. Be physically active every day. This includes: Aerobic exercise like running or swimming. Leisure activities like gardening, walking, or housework. Get 7-8 hours of sleep each night. If recommended  by your health care provider, drink red wine in moderation. This means 1 glass a day for nonpregnant women and 2 glasses a day for men. A glass of wine equals 5 oz (150 mL). Reading food labels  Check the serving size of packaged foods. For foods such as rice and pasta, the serving size refers to the amount of cooked product, not dry. Check the total fat in packaged foods. Avoid foods that have saturated fat or trans fats. Check the ingredients list for added sugars, such as corn syrup. Shopping  At the grocery store, buy most of your food from the areas near the walls of the store. This includes: Fresh fruits and vegetables (produce). Grains, beans, nuts, and seeds. Some of these may be available in unpackaged forms or large amounts (in bulk). Fresh seafood. Poultry and eggs. Low-fat dairy products. Buy whole ingredients instead of prepackaged foods. Buy fresh fruits and vegetables in-season from local farmers markets. Buy frozen fruits and vegetables in resealable bags. If you do not have access to quality fresh seafood, buy precooked frozen shrimp or canned fish, such as tuna, salmon, or sardines. Buy small amounts of raw or cooked vegetables, salads, or olives from the deli or salad bar at your store. Stock your pantry so you always have certain foods on hand, such as olive oil, canned tuna, canned tomatoes, rice, pasta, and beans. Cooking  Cook foods with extra-virgin olive oil instead of using butter or other vegetable oils.  Have meat as a side dish, and have vegetables or grains as your main dish. This means having meat in small portions or adding small amounts of meat to foods like pasta or stew. Use beans or vegetables instead of meat in common dishes like chili or lasagna. Experiment with different cooking methods. Try roasting or broiling vegetables instead of steaming or sauteing them. Add frozen vegetables to soups, stews, pasta, or rice. Add nuts or seeds for added healthy fat  at each meal. You can add these to yogurt, salads, or vegetable dishes. Marinate fish or vegetables using olive oil, lemon juice, garlic, and fresh herbs. Meal planning  Plan to eat 1 vegetarian meal one day each week. Try to work up to 2 vegetarian meals, if possible. Eat seafood 2 or more times a week. Have healthy snacks readily available, such as: Vegetable sticks with hummus. Greek yogurt. Fruit and nut trail mix. Eat balanced meals throughout the week. This includes: Fruit: 2-3 servings a day Vegetables: 4-5 servings a day Low-fat dairy: 2 servings a day Fish, poultry, or lean meat: 1 serving a day Beans and legumes: 2 or more servings a week Nuts and seeds: 1-2 servings a day Whole grains: 6-8 servings a day Extra-virgin olive oil: 3-4 servings a day Limit red meat and sweets to only a few servings a month What are my food choices? Mediterranean diet Recommended Grains: Whole-grain pasta. Brown rice. Bulgar wheat. Polenta. Couscous. Whole-wheat bread. [place]. Vegetables: Artichokes. Beets. Broccoli. Cabbage. Carrots. Eggplant. Green beans. Chard. Kale. Spinach. Onions. Leeks. Peas. Squash. Tomatoes. Peppers. Radishes. Fruits: Apples. Apricots. Avocado. Berries. Bananas. Cherries. Dates. Figs. Grapes. Lemons. Melon. Oranges. Peaches. Plums. Pomegranate. Meats and other protein foods: Beans. Almonds. Sunflower seeds. Pine nuts. Peanuts. [place]. Salmon. Scallops. Shrimp. [place]. Tilapia. Clams. Oysters. Eggs. Dairy: Low-fat milk. Cheese. Greek yogurt. Beverages: Water. Red wine. Herbal tea. Fats and oils: Extra virgin olive oil. Avocado oil. Grape seed oil. Sweets and desserts: [place] yogurt with honey. Baked apples. Poached pears. Trail mix. Seasoning and other foods: Basil. Cilantro. Coriander. Cumin. Mint. Parsley. Sage. Rosemary. Tarragon. Garlic. Oregano. Thyme. Pepper. Balsalmic vinegar. Tahini. Hummus. Tomato sauce. Olives. Mushrooms. Limit these Grains: Prepackaged  pasta or rice dishes. Prepackaged cereal with added sugar. Vegetables: Deep fried potatoes (french fries). Fruits: Fruit canned in syrup. Meats and other protein foods: Beef. Pork. Lamb. Poultry with skin. Hot dogs. [place] Salines. Dairy: Ice cream. Sour cream. Whole milk. Beverages: Juice. Sugar-sweetened soft drinks. Beer. Liquor and spirits. Fats and oils: Butter. Canola oil. Vegetable oil. Beef fat (tallow). Lard. Sweets and desserts: Cookies. Cakes. Pies. Candy. Seasoning and other foods: Mayonnaise. Premade sauces and marinades. The items listed may not be a complete list. Talk with your dietitian about what dietary choices are right for you. Summary The Mediterranean diet includes both food and lifestyle choices. Eat a variety of fresh fruits and vegetables, beans, nuts, seeds, and whole grains. Limit the amount of red meat and sweets that you eat. Talk with your health care provider about whether it is safe for you to drink red wine in moderation. This means 1 glass a day for nonpregnant women and 2 glasses a day for men. A glass of wine equals 5 oz (150 mL). This information is not intended to replace advice given to you by your health care provider. Make sure you discuss any questions you have with your health care provider. Document Released: 10/30/2015 Document Revised: 12/02/2015 Document Reviewed: 10/30/2015 Elsevier Interactive Patient Education  2017 [employer].

## 2020-09-04 NOTE — Progress Notes (Signed)
Assessment/Plan:    Moderate dementia, mixed, with behavioral disturbance  87 year old woman with a history of moderate mixed dementia with behavioral disturbance.  The patient is being seen prior to her scheduled appointment in August 2022, as she is to undergo valve replacement, and neurological clearance was requested.  After evaluation, it is felt that the benefits to undergo valve repair, will outweigh the risks of confusion postoperatively.    Recommendations are as follows: Recommend labs including CBC, CMP, and UA while in the hospital, as this may affect her cognitive status i.e. severe anemia, confusion due to UTI or electrolytes, etc. Recommend postoperative inpatient rehab until her overall status is optimal, given the fact that she has frequent falls and will require quires close monitoring. Discussed safety both in and out of the home.  Continue donepezil 10 mg daily Side effects  were discussed, Follow-up with Dr. Delice Lesch in August 2022 as priorly scheduled  Case discussed with Dr. Delice Lesch who agrees with the plan     Subjective:   [name] is a 87 y.o. female  seen today in follow up prior to her scheduled appointment in August 2022 with Dr. Delice Lesch, for evaluation prior to AVR. This patient is accompanied in the office by her daughter who supplements the history.  Previous records as well as any outside records available were reviewed prior to todays visit.  Patient is currently on donepezil 10 mg daily.  She was last seen on April 18 2020.  Since her last visit, the patient has been falling more frequently.  Most recently, on Aug 13, 2020 she fell near the fireplace, and sustained a mechanical fall as she was trying to reach her walker, without loss of consciousness, without hitting her head.  This affected the gluteal area, as well as in the right hip and thigh.  No fractures were noted.  CT of the head was negative for acute findings.  Blood thinners were  temporarily held, but due to her history of pacemaker placement, this was restarted. In the beginning of the appointment, she was focused on these issue, and was adamant to show the injury.  Her daughter states that she has moments " where she becomes very agitated, especially with the caregivers ".  If they instructed her to walk with the walker, she becomes even more belligerent, "and that is how she falls "-daughter reports.  She does take sertraline, which was reduced to 50 mg daily, but as a side effect, "makes her sleepy ". However, in trying to reduce it even further, agitation returns, for which family wants to keep her on this med.  Her short-term memory is severely affected.  Her long-term memory appears to be good.  She has become more tangential in her conversations, and if interrupted, "she becomes feisty".  She has trouble remembering appointments, and confuses physicians names.  In fact, initially she canceled this appointment, as she thought that she needed to see her cardiologist instead. She appears to be more depressed according to her daughter, stating "my friends are running out of time, so what ever is going to be is going to be ." Caregiver manage the medications, they almost stay 24/7 with her.  [name] manages the finances.  She no longer drives. Daughter reports increasing hoarding "anything, especially clothes ".  She also has lost interest in hygiene, does not want to shower or have a haircut.  She takes "fluid pill, and has urgency, but does not want to wear diapers which  causes accidents ".  She is not incontinent of stool.  Denies vivid dreams, paranoia or hallucinations.  She no longer cooks, denies trouble swallowing.  She denies any headaches, she has chronic issues with vision, but denies blurred or double vision.  She has hearing loss in the left ear.  She denies any focal numbness or tingling, unilateral weakness or tremors.     History on Initial Assessment 06/19/2019: This is  an 87 year old right-handed woman with a history of hypertension, hyperlipidemia, hypothyroidism, atrial fibrillation s/p PPM on anticoagulation with Eliquis, presenting for evaluation of memory loss. She is accompanied by her aide [name] who has only been with her for 2 months. I attempted to contact her daughter for more information, unable to reach her. She states she knows her memory is not good. She gets ready to call a friend and cannot think of their name. She had been living alone until Christmas 2019 after she had several falls. She stayed at [facility] then moved to a different home with almost 24/7 care ([facility]). She is alone from 4 to 8pm. Her daughter/POA lives in [place]. She has a son in [place]. Her daughter manages her checkbook, which aggravates her because she does not what is going on. She reports she was good with bill payments. She stopped driving 2 years ago, stating her daughter just decided she did not want the patient to drive. She manages her own medications, she fixes 4 boxes every week, aides watch her and report she does well with this. She denies misplacing things and does her own cooking at night. She denies leaving the stove on. [name] reports that she is mostly just forgetful, good with her medications. She is independent with dressing and bathing. No paranoia or hallucinations. She reports a fall when she pulled on a limb, then says "my daughter said it's not true."   PCP notes from 04/11/19 reviewed: "Pt does not seem to have overt symptoms and does have care givers to help her at home. Family dynamic seems challenging - daughter out of state who has one opinion of patient's capability vs care takers who feel she is fairly active and does a good job managing her medications. Discussed neurology referral for further evaluation of noted memory changes, but also encouraged her contact social services if she feels she should have more control - that if work-up shows good  mental ability she could regain control of her own finances."   She denies any headaches, neck/back pain, focal tingling/weakness, bladder dysfunction, anosmia, tremors. She feels her eyes are blurred when her eyelids are heavy, sometimes making her feel dizzy. She denies any falls in the past 4 months. Sleep is okay. She has swelling in both legs. Her left leg has felt weaker for the past 2-3 months. She has tingling in her fingers.   Diagnostic Data: Head CT without contrast in 06/2019 showed moderate cerebral atrophy, chronic microvascular disease. 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 She had anosognosia with lack of insight into her condition, and felt incapable of managing high-level affairs. She was advised not to drive.    I CT head 5/25 IMPRESSION: 1. No evidence of acute intracranial abnormality. 2. Atrophy and chronic microvascular ischemic disease.  PREVIOUS MEDICATIONS: none  CURRENT MEDICATIONS:  Outpatient Encounter Medications as of 09/04/2020  Medication Sig  acetaminophen (TYLENOL) 500 MG tablet Take 1,000 mg by mouth every 8 (eight) hours as needed for moderate pain or headache.    atorvastatin (LIPITOR) 20 MG tablet TAKE 1 TABLET BY MOUTH EVERY DAY IN THE EVENING (Patient taking differently: Take 20 mg by mouth at bedtime.)   cetirizine (ZYRTEC) 5 MG tablet TAKE 1 TABLET BY MOUTH EVERY DAY (Patient taking differently: Take 5 mg by mouth daily.)   Cyanocobalamin (B-12) 1000 MCG CAPS TAKE 1 CAPSULE BY MOUTH EVERY DAY (Patient taking differently: Take 1,000 mcg by mouth daily.)   D 2000 50 MCG (2000 UT) TABS TAKE 1 TABLET BY MOUTH EVERY DAY (Patient taking differently: Take 2,000 Units by mouth daily.)   donepezil (ARICEPT) 10 MG tablet Take 1 tablet daily (Patient  taking differently: Take 10 mg by mouth at bedtime.)   ELIQUIS 5 MG TABS tablet TAKE 1 TABLET BY MOUTH TWICE A DAY   fluticasone (FLONASE) 50 MCG/ACT nasal spray PLACE 1 SPRAY INTO BOTH NOSTRILS DAILY AS NEEDED FOR ALLERGIES OR RHINITIS.   furosemide (LASIX) 20 MG tablet TAKE 1 TABLET BY MOUTH EVERY DAY   levothyroxine (SYNTHROID) 88 MCG tablet TAKE 1 TABLET BY MOUTH EVERY DAY IN THE MORNING ON AN EMPTY STOMACH   lisinopril (ZESTRIL) 30 MG tablet Take 1 tablet (30 mg total) by mouth daily.   oxybutynin (DITROPAN-XL) 5 MG 24 hr tablet TAKE 1 TABLET BY MOUTH EVERY DAY (Patient taking differently: Take 5 mg by mouth as needed.)   sertraline (ZOLOFT) 50 MG tablet Take 1 tablet (50 mg total) by mouth at bedtime.   Carboxymethylcell-Hypromellose (GENTEAL OP) Place 1 drop into both eyes daily as needed (dry eyes).   No facility-administered encounter medications on file as of 09/04/2020.     Objective:     PHYSICAL EXAMINATION:    VITALS:   Vitals:   09/04/20 1046  BP: 115/72  Pulse: 90  SpO2: 97%  Weight: 146 lb (66.2 kg)  Height: 5\' 2"  (1.575 m)    GEN:  The patient appears stated age and is in NAD. HEENT:  Normocephalic, atraumatic.   Neurological examination:  General: NAD, appears stated age. Orientation: The patient is alert. Oriented to person, place not to date Cranial nerves: There is good facial symmetry.The speech is fluent and clear. No aphasia or dysarthria. Fund of knowledge is reduced. Recent memory impaired, remote is norma . Attention and concentration are reduced.  Able to name objects and repeat phrases.  Hearing is intact to conversational tone.    Sensation: Sensation is intact to light touch throughout Motor: Strength is at least antigravity x4.  Montreal Cognitive Assessment  07/15/2020  Visuospatial/ Executive (0/5) 2  Naming (0/3) 2  Attention: Read list of digits (0/2) 1  Attention: Read list of letters (0/1) 1  Attention: Serial 7 subtraction starting at  100 (0/3) 1  Language: Repeat phrase (0/2) 2  Language : Fluency (0/1) 1  Abstraction (0/2) 1  Delayed Recall (0/5) 0  Orientation (0/6) 4  Total 15     MMSE - Mini Mental State Exam 04/08/2020 07/18/2019  Not completed: Refused -  Orientation to time - 2  Orientation to Place - 4  Registration - 3  Attention/ Calculation - 5  Recall - 0  Language- name 2 objects - 2  Language- repeat - 1  Language- follow 3 step command - 2  Language- read & follow direction - 1  Write a sentence - 1  Copy design - 0  Total score - 21      Movement examination: Tone: There is normal tone in the UE/LE Abnormal movements:  no tremor.  No myoclonus.  No asterixis.   Coordination:  There is no  decremation with RAM's. Normal finger to nose  Gait and Station: The patient has difficulty arising out of a deep-seated chair without the use of the hands. The patient's stride length is shorter.  Gait is cautious and narrow only with walker, not tasted without walker.    CBC CBC Latest Ref Rng & Units 07/31/2020 07/31/2020 07/21/2020  WBC 3.4 - 10.8 x10E3/uL - - 6.7  Hemoglobin 12.0 - 15.0 g/dL 11.2(L) 11.2(L) 10.8(L)  Hematocrit 36.0 - 46.0 % 33.0(L) 33.0(L) 33.5(L)  Platelets 150 - 450 x10E3/uL - - 196     CMP Latest Ref Rng & Units 08/12/2020 07/31/2020 07/31/2020  Glucose 70 - 99 mg/dL 106(H) - -  BUN 8 - 23 mg/dL 16 - -  Creatinine 0.44 - 1.00 mg/dL 1.29(H) - -  Sodium 135 - 145 mmol/L 134(L) 140 140  Potassium 3.5 - 5.1 mmol/L 4.8 3.7 3.5  Chloride 98 - 111 mmol/L 95(L) - -  CO2 22 - 32 mmol/L 29 - -  Calcium 8.9 - 10.3 mg/dL 9.3 - -  Total Protein 6.0 - 8.3 g/dL - - -  Total Bilirubin 0.2 - 1.2 mg/dL - - -  Alkaline Phos 39 - 117 U/L - - -  AST 0 - 37 U/L - - -  ALT 0 - 35 U/L - - -       Total time spent on today's visit was 45 minutes, including both face-to-face time and nonface-to-face time. Time included that spent on review of records (prior notes available to me/labs/imaging if  pertinent), discussing treatment and goals, answering patient's questions and coordinating care.  Cc:  [name], MD Sharene Butters, PA-C

## 2020-09-08 ENCOUNTER — Ambulatory Visit: Payer: Medicare Other | Admitting: Internal Medicine

## 2020-09-18 ENCOUNTER — Other Ambulatory Visit: Payer: Self-pay

## 2020-09-18 ENCOUNTER — Encounter: Payer: Self-pay | Admitting: Internal Medicine

## 2020-09-18 ENCOUNTER — Ambulatory Visit (INDEPENDENT_AMBULATORY_CARE_PROVIDER_SITE_OTHER): Payer: Medicare Other | Admitting: Internal Medicine

## 2020-09-18 VITALS — BP 100/56 | HR 75 | Ht 62.0 in | Wt 140.0 lb

## 2020-09-18 DIAGNOSIS — I482 Chronic atrial fibrillation, unspecified: Secondary | ICD-10-CM

## 2020-09-18 DIAGNOSIS — I10 Essential (primary) hypertension: Secondary | ICD-10-CM

## 2020-09-18 DIAGNOSIS — I442 Atrioventricular block, complete: Secondary | ICD-10-CM | POA: Diagnosis not present

## 2020-09-18 DIAGNOSIS — Z950 Presence of cardiac pacemaker: Secondary | ICD-10-CM | POA: Diagnosis not present

## 2020-09-18 MED ORDER — LISINOPRIL 20 MG PO TABS: 10.0000 mg | ORAL_TABLET | Freq: Every day | ORAL | 3 refills | Status: DC

## 2020-09-18 NOTE — Patient Instructions (Signed)
Medication Instructions:  Your physician has recommended you make the following change in your medication:  **  Increase your Furosemide to 40mg  (2 tablets) by mouth x 3 days.  Decrease your Lisinopril 20mg  - 1/2 tablet by mouth daily  *If you need a refill on your cardiac medications before your next appointment, please call your pharmacy*   Lab Work: None ordered.  If you have labs (blood work) drawn today and your tests are completely normal, you will receive your results only by: [place] (if you have MyChart) OR A paper copy in the mail If you have any lab test that is abnormal or we need to change your treatment, we will call you to review the results.   Testing/Procedures: None ordered.    Follow-Up: At [facility], you and your health needs are our priority.  As part of our continuing mission to provide you with exceptional heart care, we have created designated Provider Care Teams.  These Care Teams include your primary Cardiologist (physician) and Advanced Practice Providers (APPs -  Physician Assistants and Nurse Practitioners) who all work together to provide you with the care you need, when you need it.  We recommend signing up for the patient portal called "MyChart".  Sign up information is provided on this After Visit Summary.  MyChart is used to connect with patients for Virtual Visits (Telemedicine).  Patients are able to view lab/test results, encounter notes, upcoming appointments, etc.  Non-urgent messages can be sent to your provider as well.   To learn more about what you can do with MyChart, go to [email or web address].    Your next appointment:   12 month(s)  The format for your next appointment:   In Person  Provider:   Virl Axe, MD

## 2020-09-18 NOTE — Progress Notes (Signed)
Patient ID: [name], female   DOB: Aug 08, 1930, 87 y.o.   MRN: [id number]   .      Patient Care Team: [name], MD as PCP - General (Family Medicine) Debbora Dus, [facility] as Pharmacist (Pharmacist) Delice Lesch Lezlie Octave, MD as Consulting Physician (Neurology) Shawn Route Nancy Marus (Neurology)   HPI  [name] is a 87 y.o. female SEEN IN followup the pacemaker originally implanted by Dr. Doreatha Lew 25 years ago.  She has a history of complete heart block now permanent atrial fibrillation and severe orthostatic hypotension  She underwent device generator replacement 11/18.  When seen 11/20, concerns of her very old Intermedics lead and impending lead failure; we undertook premature generator replacement and lead replacement.   She is felt to have severe low-flow low gradient aortic stenosis and discussions with Dr. Elder Love 5/22 were in favor of proceeding  With TAVR      Present with her daughter today  08/13/20 she had a fall that have effected her right hip  Today, the patient denies chest pain, nocturnal dyspnea, orthopnea  .  There have been no palpitations or syncope.  Complains of fatigue and unable to keep her eyes open even when trying to watch TV. Her lightheadedness occurs while standing and if standing, it is relieved by sitting .   Dyspnea assoc with exertion, unable to walk less that 10 feet.   More recently she been having issues with her eyesight, unable to stabilize while walking. Currently she can not walk up or down the stairs due to the fear of missing a step while unable to judge the level of the steps she thinks this is worse when her blood pressure is lower  Prior to 3 months ago she was doing fairly well  She was active in the yard feeding the birds and doing yard work daily .She is unable to do that now    Her at home systolic bp was below 034 and diastolic bp below 60 all weak. This is outside of her norm   She notes she has lost some of  her memory and unable to remember her daughters name   She do have caregivers that do assist 22 out of 24 hours a day   Date Cr K TSH Hgb Dig  10/20 0.91 4.1 0.05 13.    3/21 0.99 4.6 0.15 (dose adjusted)  1.1  5/22 1.29 4.8  11.2     DATE TEST EF%   9/20 Echo   55-65 % AS mod--mean grad 23  8/21  Echo    4/22 Echo 60-65% AS mod  mean grad 21  5/22 LHC  Non obstructive diseawe    Past Medical History:  Diagnosis Date   Aortic stenosis    Arrhythmia    chronic atrial fib   Atrial fibrillation, chronic (HCC)    Chronic anticoagulation    Chronic atrial fibrillation ([place]) 01/28/2017   Chronic venous insufficiency 01/28/2017   Clavicle fracture 74/25/9563   Complication of anesthesia    " difficult waking "   Long term current use of anticoagulant therapy 01/28/2017   Osteoarthritis    Presence of permanent cardiac pacemaker 01/28/2017   Original implant 1992 Lead Intermedics 431-04 Generator change 2001 and 2011.  Medtronic generator.    SSS (sick sinus syndrome) (HCC)    Thyroid disease    hypothyroidism   TIA (transient ischemic attack)     Past Surgical History:  Procedure Laterality Date   ABDOMINAL  HYSTERECTOMY     INSERT / REPLACE / REMOVE PACEMAKER     generator change 2011 medtronic sigma   KNEE SURGERY     LEAD INSERTION N/A 03/05/2019   Procedure: LEAD INSERTION - RV LEAD;  Surgeon: Deboraha Sprang, MD;  Location: [facility];  Service: Cardiovascular;  Laterality: N/A;   PACEMAKER IMPLANT N/A 03/05/2019   Procedure: PACEMAKER IMPLANT;  Surgeon: Deboraha Sprang, MD;  Location: [place] [facility];  Service: Cardiovascular;  Laterality: N/A;   PACEMAKER INSERTION     PARS PLANA VITRECTOMY Right 07/19/2018   Procedure: VITRECTOMY WITH VITREOUS BIPOSY & ENDOLASER;  Surgeon: Jalene Mullet, MD;  Location: [facility];  Service: Ophthalmology;  Laterality: Right;   PPM GENERATOR CHANGEOUT N/A 02/02/2017   Procedure: PPM GENERATOR CHANGEOUT;  Surgeon: Deboraha Sprang,  MD;  Location: [place] [facility];  Service: Cardiovascular;  Laterality: N/A;   REPLACEMENT TOTAL KNEE BILATERAL     RIGHT/LEFT HEART CATH AND CORONARY ANGIOGRAPHY N/A 07/31/2020   Procedure: RIGHT/LEFT HEART CATH AND CORONARY ANGIOGRAPHY;  Surgeon: Burnell Blanks, MD;  Location: [place] [facility];  Service: Cardiovascular;  Laterality: N/A;   VARICOSE VEIN SURGERY      Current Outpatient Medications  Medication Sig Dispense Refill   acetaminophen (TYLENOL) 500 MG tablet Take 1,000 mg by mouth every 8 (eight) hours as needed for moderate pain or headache.      atorvastatin (LIPITOR) 20 MG tablet TAKE 1 TABLET BY MOUTH EVERY DAY IN THE EVENING 90 tablet 1   Carboxymethylcell-Hypromellose (GENTEAL OP) Place 1 drop into both eyes daily as needed (dry eyes).     cetirizine (ZYRTEC) 5 MG tablet TAKE 1 TABLET BY MOUTH EVERY DAY 90 tablet 2   Cyanocobalamin (B-12) 1000 MCG CAPS TAKE 1 CAPSULE BY MOUTH EVERY DAY 30 capsule 11   D 2000 50 MCG (2000 UT) TABS TAKE 1 TABLET BY MOUTH EVERY DAY 90 tablet 3   donepezil (ARICEPT) 10 MG tablet Take 1 tablet daily 90 tablet 3   ELIQUIS 5 MG TABS tablet TAKE 1 TABLET BY MOUTH TWICE A DAY 180 tablet 1   fluticasone (FLONASE) 50 MCG/ACT nasal spray PLACE 1 SPRAY INTO BOTH NOSTRILS DAILY AS NEEDED FOR ALLERGIES OR RHINITIS. 16 mL 2   furosemide (LASIX) 20 MG tablet TAKE 1 TABLET BY MOUTH EVERY DAY 90 tablet 1   levothyroxine (SYNTHROID) 88 MCG tablet TAKE 1 TABLET BY MOUTH EVERY DAY IN THE MORNING ON AN EMPTY STOMACH 90 tablet 3   lisinopril (ZESTRIL) 30 MG tablet Take 1 tablet (30 mg total) by mouth daily. 90 tablet 3   oxybutynin (DITROPAN-XL) 5 MG 24 hr tablet TAKE 1 TABLET BY MOUTH EVERY DAY 90 tablet 1   sertraline (ZOLOFT) 50 MG tablet Take 1 tablet (50 mg total) by mouth at bedtime. 30 tablet 2   No current facility-administered medications for this visit.    Allergies  Allergen Reactions   Adhesive [Tape] Other (See Comments)    TAPE WILL  TEAR THE SKIN!!!!   Penicillins Hives and Rash    Did it involve swelling of the face/tongue/throat, SOB, or low BP? No Did it involve sudden or severe rash/hives, skin peeling, or any reaction on the inside of your mouth or nose? Yes Did you need to seek medical attention at a hospital or doctor's office? Unknown  When did it last happen?      unknown  If all above answers are "NO", may proceed  with cephalosporin use.     Review of Systems negative except from HPI and PMH  Physical Exam: BP (!) 100/56   Pulse 75   Ht 5\' 2"  (1.575 m)   Wt 140 lb (63.5 kg)   SpO2 96%   BMI 25.61 kg/m  Well developed and well nourished in no acute distress HENT normal Neck supple with JVP 10  lungs crackles Device pocket well healed; without hematoma or erythema.  There is no tethering  Regular rate and rhythm, single S2 gallop 3/6 murmur Abd-soft with active BS No Clubbing cyanosis 1+ edema with venous varicosities Skin-warm and dry A & Oriented  Grossly normal sensory and motor function  ECG: Atrial fibrillation at 75 -04/03/1938 Right axis at 109 Right bundle branch block   Assessment and  Plan  Intermittent complete heart block with intrinsic conduction today  Orthostatic hypotension  Hypertension   Atrial fibrillation permanent  HFpEF acute/chronic  Left leg swelling  Pacemaker-Medtronic    Atrial fibrillation is permanent.  Ventricular rate is reasonable; interestingly, there is left ventricular pacing and there was 6 months ago; this does not seem to be coincidental with any change in medications prompting the concern that it may be associated with her worsening dyspnea, either positively or consequentially.  There may be a role for beta-blocker and/or resuming her digoxin to slow rates down to see if we cannot improve symptoms.  For now, we will continue her Eliquis at 5 mg twice daily.  There has been a recent change in her creatinine so we will want to monitor this closely.   No significant clinical bleeding.  She is volume overloaded.  Symptomatically she has significant shortness of breath has been rapidly progressive.  I suspect some of it is her valve; some may be her heart failure.  With the volume issues we will increase her furosemide from 20--40 mg for 3 days.  Visual issues in the lower trending blood pressure raises concern that these are causally related; we will decrease her lisinopril from 20--10 mg a day.  The family checks her blood pressure daily they will let us know over the next week how it is trending and whether she is better.     Extensive discussions regarding the above physiology.  Sought out Dr. Elder Love; he had left the office.    I,Stephanie Williams,acting as a Education administrator for Virl Axe, MD.,have documented all relevant documentation on the behalf of Virl Axe, MD,as directed by  Virl Axe, MD while in the presence of Virl Axe, MD.  I, Virl Axe, MD, have reviewed all documentation for this visit. The documentation on 09/18/20 for the exam, diagnosis, procedures, and orders are all accurate and complete.

## 2020-09-25 ENCOUNTER — Other Ambulatory Visit: Payer: Self-pay | Admitting: Family Medicine

## 2020-09-25 DIAGNOSIS — R0981 Nasal congestion: Secondary | ICD-10-CM

## 2020-09-26 ENCOUNTER — Ambulatory Visit
Admission: RE | Admit: 2020-09-26 | Discharge: 2020-09-26 | Disposition: A | Discharge status: Home / Self Care | Payer: Medicare Other | Source: Ambulatory Visit | Attending: Family Medicine | Admitting: Family Medicine

## 2020-09-26 ENCOUNTER — Encounter (HOSPITAL_COMMUNITY): Payer: Self-pay

## 2020-09-26 ENCOUNTER — Other Ambulatory Visit: Payer: Self-pay

## 2020-09-26 ENCOUNTER — Emergency Department (HOSPITAL_COMMUNITY): Payer: Medicare Other

## 2020-09-26 ENCOUNTER — Emergency Department (HOSPITAL_COMMUNITY)
Admission: EM | Admit: 2020-09-26 | Discharge: 2020-09-26 | Disposition: A | Discharge status: Home / Self Care | Payer: Medicare Other | Attending: Emergency Medicine | Admitting: Emergency Medicine

## 2020-09-26 DIAGNOSIS — Z7901 Long term (current) use of anticoagulants: Secondary | ICD-10-CM | POA: Diagnosis not present

## 2020-09-26 DIAGNOSIS — M7989 Other specified soft tissue disorders: Secondary | ICD-10-CM | POA: Diagnosis not present

## 2020-09-26 DIAGNOSIS — Z96653 Presence of artificial knee joint, bilateral: Secondary | ICD-10-CM | POA: Insufficient documentation

## 2020-09-26 DIAGNOSIS — F0390 Unspecified dementia without behavioral disturbance: Secondary | ICD-10-CM | POA: Insufficient documentation

## 2020-09-26 DIAGNOSIS — S59901A Unspecified injury of right elbow, initial encounter: Secondary | ICD-10-CM | POA: Diagnosis not present

## 2020-09-26 DIAGNOSIS — W133XXA Fall through floor, initial encounter: Secondary | ICD-10-CM | POA: Insufficient documentation

## 2020-09-26 DIAGNOSIS — W19XXXA Unspecified fall, initial encounter: Secondary | ICD-10-CM | POA: Diagnosis not present

## 2020-09-26 DIAGNOSIS — Z79899 Other long term (current) drug therapy: Secondary | ICD-10-CM | POA: Insufficient documentation

## 2020-09-26 DIAGNOSIS — J811 Chronic pulmonary edema: Secondary | ICD-10-CM | POA: Diagnosis not present

## 2020-09-26 DIAGNOSIS — S61210A Laceration without foreign body of right index finger without damage to nail, initial encounter: Secondary | ICD-10-CM | POA: Insufficient documentation

## 2020-09-26 DIAGNOSIS — E2839 Other primary ovarian failure: Secondary | ICD-10-CM

## 2020-09-26 DIAGNOSIS — R7303 Prediabetes: Secondary | ICD-10-CM | POA: Insufficient documentation

## 2020-09-26 DIAGNOSIS — S51011A Laceration without foreign body of right elbow, initial encounter: Secondary | ICD-10-CM | POA: Insufficient documentation

## 2020-09-26 DIAGNOSIS — Z8673 Personal history of transient ischemic attack (TIA), and cerebral infarction without residual deficits: Secondary | ICD-10-CM | POA: Diagnosis not present

## 2020-09-26 DIAGNOSIS — S61214A Laceration without foreign body of right ring finger without damage to nail, initial encounter: Secondary | ICD-10-CM | POA: Diagnosis not present

## 2020-09-26 DIAGNOSIS — I110 Hypertensive heart disease with heart failure: Secondary | ICD-10-CM | POA: Insufficient documentation

## 2020-09-26 DIAGNOSIS — S60511A Abrasion of right hand, initial encounter: Secondary | ICD-10-CM | POA: Diagnosis not present

## 2020-09-26 DIAGNOSIS — I482 Chronic atrial fibrillation, unspecified: Secondary | ICD-10-CM | POA: Diagnosis not present

## 2020-09-26 DIAGNOSIS — R079 Chest pain, unspecified: Secondary | ICD-10-CM | POA: Diagnosis not present

## 2020-09-26 DIAGNOSIS — Z87891 Personal history of nicotine dependence: Secondary | ICD-10-CM | POA: Insufficient documentation

## 2020-09-26 DIAGNOSIS — Z043 Encounter for examination and observation following other accident: Secondary | ICD-10-CM | POA: Diagnosis not present

## 2020-09-26 DIAGNOSIS — R102 Pelvic and perineal pain: Secondary | ICD-10-CM | POA: Diagnosis not present

## 2020-09-26 DIAGNOSIS — Z950 Presence of cardiac pacemaker: Secondary | ICD-10-CM | POA: Insufficient documentation

## 2020-09-26 DIAGNOSIS — I509 Heart failure, unspecified: Secondary | ICD-10-CM | POA: Insufficient documentation

## 2020-09-26 DIAGNOSIS — E039 Hypothyroidism, unspecified: Secondary | ICD-10-CM | POA: Diagnosis not present

## 2020-09-26 DIAGNOSIS — M810 Age-related osteoporosis without current pathological fracture: Secondary | ICD-10-CM | POA: Diagnosis not present

## 2020-09-26 DIAGNOSIS — I10 Essential (primary) hypertension: Secondary | ICD-10-CM | POA: Diagnosis not present

## 2020-09-26 DIAGNOSIS — I517 Cardiomegaly: Secondary | ICD-10-CM | POA: Diagnosis not present

## 2020-09-26 DIAGNOSIS — R519 Headache, unspecified: Secondary | ICD-10-CM | POA: Insufficient documentation

## 2020-09-26 DIAGNOSIS — J449 Chronic obstructive pulmonary disease, unspecified: Secondary | ICD-10-CM | POA: Diagnosis not present

## 2020-09-26 DIAGNOSIS — R58 Hemorrhage, not elsewhere classified: Secondary | ICD-10-CM | POA: Diagnosis not present

## 2020-09-26 DIAGNOSIS — R0902 Hypoxemia: Secondary | ICD-10-CM | POA: Diagnosis not present

## 2020-09-26 DIAGNOSIS — Z780 Asymptomatic menopausal state: Secondary | ICD-10-CM | POA: Diagnosis not present

## 2020-09-26 DIAGNOSIS — M160 Bilateral primary osteoarthritis of hip: Secondary | ICD-10-CM | POA: Diagnosis not present

## 2020-09-26 DIAGNOSIS — S0003XA Contusion of scalp, initial encounter: Secondary | ICD-10-CM | POA: Diagnosis not present

## 2020-09-26 DIAGNOSIS — I959 Hypotension, unspecified: Secondary | ICD-10-CM | POA: Diagnosis not present

## 2020-09-26 LAB — CBC WITH DIFFERENTIAL/PLATELET
Abs Immature Granulocytes: 0.04 10*3/uL (ref 0.00–0.07)
Basophils Absolute: 0.1 10*3/uL (ref 0.0–0.1)
Basophils Relative: 1 %
Eosinophils Absolute: 0 10*3/uL (ref 0.0–0.5)
Eosinophils Relative: 0 %
HCT: 34.2 % — ABNORMAL LOW (ref 36.0–46.0)
Hemoglobin: 10.7 g/dL — ABNORMAL LOW (ref 12.0–15.0)
Immature Granulocytes: 1 %
Lymphocytes Relative: 7 %
Lymphs Abs: 0.6 10*3/uL — ABNORMAL LOW (ref 0.7–4.0)
MCH: 26 pg (ref 26.0–34.0)
MCHC: 31.3 g/dL (ref 30.0–36.0)
MCV: 83 fL (ref 80.0–100.0)
Monocytes Absolute: 1 10*3/uL (ref 0.1–1.0)
Monocytes Relative: 11 %
Neutro Abs: 7.1 10*3/uL (ref 1.7–7.7)
Neutrophils Relative %: 80 %
Platelets: 205 10*3/uL (ref 150–400)
RBC: 4.12 MIL/uL (ref 3.87–5.11)
RDW: 18.6 % — ABNORMAL HIGH (ref 11.5–15.5)
WBC: 8.8 10*3/uL (ref 4.0–10.5)
nRBC: 0 % (ref 0.0–0.2)

## 2020-09-26 LAB — BASIC METABOLIC PANEL
Anion gap: 8 (ref 5–15)
BUN: 22 mg/dL (ref 8–23)
CO2: 30 mmol/L (ref 22–32)
Calcium: 8.8 mg/dL — ABNORMAL LOW (ref 8.9–10.3)
Chloride: 94 mmol/L — ABNORMAL LOW (ref 98–111)
Creatinine, Ser: 1.51 mg/dL — ABNORMAL HIGH (ref 0.44–1.00)
GFR, Estimated: 33 mL/min — ABNORMAL LOW (ref >60)
Glucose, Bld: 129 mg/dL — ABNORMAL HIGH (ref 70–99)
Potassium: 3.8 mmol/L (ref 3.5–5.1)
Sodium: 132 mmol/L — ABNORMAL LOW (ref 135–145)

## 2020-09-26 LAB — PROTIME-INR
INR: 1.9 — ABNORMAL HIGH (ref 0.8–1.2)
Prothrombin Time: 21.9 seconds — ABNORMAL HIGH (ref 11.4–15.2)

## 2020-09-26 MED ORDER — ACETAMINOPHEN 500 MG PO TABS
500.0000 mg | ORAL_TABLET | Freq: Once | ORAL | Status: AC
  Administered 2020-09-26: 500 mg via ORAL
  Filled 2020-09-26: qty 1

## 2020-09-26 NOTE — Discharge Instructions (Addendum)
Recommend following up with your primary doctor to discuss the fall you had as well as recheck of your wounds.  Come back to ER if you have any additional falls, if you develop chest pain or difficulty in breathing.  Recommend changing dressings every day or every other day.  For your elbow skin tear, recommend Xeroform of petroleum gauze wrapped in additional dry gauze.

## 2020-09-26 NOTE — ED Notes (Signed)
Radiology to bedside. 

## 2020-09-26 NOTE — ED Notes (Signed)
Right elbow with skin tear, redressed with nonadherent dressing to cover wound the soft kerlex wrap to cover. Patient tolerated well.

## 2020-09-26 NOTE — Progress Notes (Signed)
Orthopedic Tech Progress Note Patient Details:  [name] November 27, 1930 [id number] Level 2 Trauma  Patient ID: [name], female   DOB: 05-04-1930, 87 y.o.   MRN: [id number]  Jearld Lesch 09/26/2020, 12:04 PM

## 2020-09-26 NOTE — ED Notes (Signed)
EDMD at bedside

## 2020-09-26 NOTE — ED Provider Notes (Signed)
[facility] EMERGENCY DEPARTMENT Provider Note   CSN: [id number] Arrival date & time: 09/26/20  1158     History Chief Complaint  Patient presents with   [name] is a 87 y.o. female.  Multiple medical problems including chronic A. fib on Eliquis presents to ER as a level 2 trauma.  Per report, patient was walking out of doctor's appointment when sliding door hit her and caused her to fall.  Patient thinks that she may have lost consciousness with the fall.  Was not having symptoms prior to the fall.  Has slight headache, no neck pain or back pain no chest or abdominal pain.  Having some slight pain in her right elbow and right hand.  Level 5 caveat history limited due to acuity.  HPI     Past Medical History:  Diagnosis Date   Aortic stenosis    Arrhythmia    chronic atrial fib   Atrial fibrillation, chronic (HCC)    Chronic anticoagulation    Chronic atrial fibrillation ([place]) 01/28/2017   Chronic venous insufficiency 01/28/2017   Clavicle fracture 96/22/2979   Complication of anesthesia    " difficult waking "   Long term current use of anticoagulant therapy 01/28/2017   Osteoarthritis    Presence of permanent cardiac pacemaker 01/28/2017   Original implant 1992 Lead [employer] 431-04 Generator change 2001 and 2011.  Medtronic generator.    SSS (sick sinus syndrome) (HCC)    Thyroid disease    hypothyroidism   TIA (transient ischemic attack)     Patient Active Problem List   Diagnosis Date Noted   Moderate dementia with behavioral disturbance ([place]) 09/04/2020   Severe aortic stenosis    Aortic stenosis 07/15/2020   CHF (congestive heart failure) ([place]) 06/05/2020   Left sided abdominal pain 05/13/2020   Sinus congestion 12/18/2019   Dementia without behavioral disturbance ([place]) 07/31/2019   Sick sinus syndrome ([place]) 07/28/2019   Epigastric pain 06/12/2019   Localized swelling of left lower leg 04/11/2019   Memory loss  04/11/2019   Prediabetes 03/14/2019   Complete heart block ([place]) 03/05/2019   TIA (transient ischemic attack) 01/15/2019   Gait abnormality 01/15/2019   Transient ischemia 12/13/2018   Dysarthria 12/11/2018   Hypothyroidism 12/11/2018   Arthritis of right hand 08/17/2018   Chronic back pain 04/11/2018   Encounter for therapeutic drug monitoring 03/09/2018   Hypertension 01/04/2018   Clavicle fracture 11/20/2017   Closed fracture of clavicle 11/20/2017   Fall    Accelerated hypertension    Carpal tunnel syndrome of left wrist 08/02/2017   Carpal tunnel syndrome of right wrist 08/02/2017   Bradycardia 02/02/2017   Presence of permanent cardiac pacemaker 01/28/2017   Chronic a-fib ([place]) 01/28/2017   Long term current use of anticoagulant therapy 01/28/2017   Chronic venous insufficiency 01/28/2017   Chronic atrial fibrillation ([place]) 01/28/2017   Peripheral venous insufficiency 01/28/2017   Cardiac pacemaker in situ 01/28/2017   Anticoagulated 01/28/2017   DOE (dyspnea on exertion) 09/22/2015   Dyspnea on exertion 09/22/2015   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 05/15/2014   After cataract of left eye not obscuring vision 05/15/2014   Bilateral dry eyes 05/15/2014   Dermatochalasis of eyelid 05/15/2014   Hyperopia of both eyes with astigmatism and presbyopia 05/15/2014   Irregular astigmatism of right eye 05/15/2014   Mechanical complication due to intraocular lens implant 05/15/2014   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   Vitreous syneresis of both eyes  05/15/2014    Past Surgical History:  Procedure Laterality Date   ABDOMINAL HYSTERECTOMY     INSERT / REPLACE / REMOVE PACEMAKER     generator change 2011 medtronic sigma   KNEE SURGERY     LEAD INSERTION N/A 03/05/2019   Procedure: LEAD INSERTION - RV LEAD;  Surgeon: Deboraha Sprang, MD;  Location: [place] [facility];  Service: Cardiovascular;  Laterality: N/A;   PACEMAKER IMPLANT N/A 03/05/2019   Procedure: PACEMAKER IMPLANT;   Surgeon: Deboraha Sprang, MD;  Location: [place] [facility];  Service: Cardiovascular;  Laterality: N/A;   PACEMAKER INSERTION     PARS PLANA VITRECTOMY Right 07/19/2018   Procedure: VITRECTOMY WITH VITREOUS BIPOSY & ENDOLASER;  Surgeon: Jalene Mullet, MD;  Location: [place];  Service: Ophthalmology;  Laterality: Right;   PPM GENERATOR CHANGEOUT N/A 02/02/2017   Procedure: PPM GENERATOR CHANGEOUT;  Surgeon: Deboraha Sprang, MD;  Location: [place] [facility];  Service: Cardiovascular;  Laterality: N/A;   REPLACEMENT TOTAL KNEE BILATERAL     RIGHT/LEFT HEART CATH AND CORONARY ANGIOGRAPHY N/A 07/31/2020   Procedure: RIGHT/LEFT HEART CATH AND CORONARY ANGIOGRAPHY;  Surgeon: Burnell Blanks, MD;  Location: [place] [facility];  Service: Cardiovascular;  Laterality: N/A;   VARICOSE VEIN SURGERY       OB History     Gravida  3   Para  3   Term      Preterm      AB      Living  2      SAB      IAB      Ectopic      Multiple      Live Births              Family History  Problem Relation Age of Onset   Diabetes Mother    Mental illness Mother    Diabetes Father    Heart disease Father     Social History   Tobacco Use   Smoking status: Former    Packs/day: 0.25    Years: 10.00    Pack years: 2.50    Types: Cigarettes    Quit date: 1980    Years since quitting: 42.5   Smokeless tobacco: Never  Vaping Use   Vaping Use: Never used  Substance Use Topics   Alcohol use: No   Drug use: No    Home Medications Prior to Admission medications   Medication Sig Start Date End Date Taking? Authorizing Provider  acetaminophen (TYLENOL) 500 MG tablet Take 1,000 mg by mouth every 8 (eight) hours as needed for moderate pain or headache.     [provider]  atorvastatin (LIPITOR) 20 MG tablet TAKE 1 TABLET BY MOUTH EVERY DAY IN THE EVENING 06/30/20   [name], MD  Carboxymethylcell-Hypromellose (GENTEAL OP) Place 1 drop into both eyes daily as needed  (dry eyes).    [provider]  cetirizine (ZYRTEC) 5 MG tablet TAKE 1 TABLET BY MOUTH EVERY DAY 04/18/20   [name], MD  Cyanocobalamin (B-12) 1000 MCG CAPS TAKE 1 CAPSULE BY MOUTH EVERY DAY 03/17/20   [name], MD  D 2000 50 MCG [phone] UT) TABS TAKE 1 TABLET BY MOUTH EVERY DAY 07/07/20   [name], MD  donepezil (ARICEPT) 10 MG tablet Take 1 tablet daily 04/18/20   Cameron Sprang, MD  ELIQUIS 5 MG TABS tablet TAKE 1 TABLET BY MOUTH TWICE A DAY  08/15/20   [name], MD  fluticasone (FLONASE) 50 MCG/ACT nasal spray PLACE 1 SPRAY INTO BOTH NOSTRILS DAILY AS NEEDED FOR ALLERGIES OR RHINITIS. 09/25/20   [name], MD  furosemide (LASIX) 20 MG tablet TAKE 1 TABLET BY MOUTH EVERY DAY 08/21/20   [name], MD  levothyroxine (SYNTHROID) 88 MCG tablet TAKE 1 TABLET BY MOUTH EVERY DAY IN THE MORNING ON AN EMPTY STOMACH 08/04/20   [name], MD  lisinopril (ZESTRIL) 20 MG tablet Take 0.5 tablets (10 mg total) by mouth daily. 09/18/20   Deboraha Sprang, MD  oxybutynin (DITROPAN-XL) 5 MG 24 hr tablet TAKE 1 TABLET BY MOUTH EVERY DAY 06/05/20   [name], MD  sertraline (ZOLOFT) 50 MG tablet Take 1 tablet (50 mg total) by mouth at bedtime. 05/08/20   Cameron Sprang, MD    Allergies    Adhesive [tape] and Penicillins  Review of Systems   Review of Systems  Unable to perform ROS: Acuity of condition  Musculoskeletal:  Positive for arthralgias.   Physical Exam Updated Vital Signs BP 135/87 (BP Location: Left Arm)   Pulse 68   Temp 98.1 F (36.7 C) (Oral)   Resp 18   SpO2 94%   Physical Exam Vitals and nursing note reviewed.  Constitutional:      General: She is not in acute distress.    Appearance: She is well-developed.  HENT:     Head: Normocephalic and atraumatic.     Comments: Some tenderness to the right forehead but no significant hematoma or laceration or abrasion appreciated Eyes:     Conjunctiva/sclera: Conjunctivae normal.  Neck:      Comments: C collar intact Cardiovascular:     Rate and Rhythm: Normal rate and regular rhythm.     Heart sounds: No murmur heard. Pulmonary:     Effort: Pulmonary effort is normal. No respiratory distress.     Breath sounds: Normal breath sounds.  Abdominal:     Palpations: Abdomen is soft.     Tenderness: There is no abdominal tenderness.  Musculoskeletal:     Comments: Back: No T or L-spine tenderness, no step-off or deformity RUE: 2 through centimeter long skin tear over the right lateral elbow, slight tenderness to elbow, no active bleeding, there is also small skin tear to the fourth finger and second finger.  No significant laceration, sensation intact, flexion and extension intact, distal motor intact, radial pulse and distal cap refill all intact LUE: No tenderness palpation throughout, no deformity appreciated RLE: No tenderness palpation throughout, no deformity appreciated LLE: No tenderness palpation throughout, no deformity appreciated  Skin:    General: Skin is warm and dry.  Neurological:     General: No focal deficit present.     Mental Status: She is alert.  Psychiatric:        Mood and Affect: Mood normal.        Behavior: Behavior normal.    ED Results / Procedures / Treatments   Labs (all labs ordered are listed, but only abnormal results are displayed) Labs Reviewed  CBC WITH DIFFERENTIAL/PLATELET - Abnormal; Notable for the following components:      Result Value   Hemoglobin 10.7 (*)    HCT 34.2 (*)    RDW 18.6 (*)    Lymphs Abs 0.6 (*)    All other components within normal limits  BASIC METABOLIC PANEL - Abnormal; Notable for the following components:   Sodium 132 (*)  Chloride 94 (*)    Glucose, Bld 129 (*)    Creatinine, Ser 1.51 (*)    Calcium 8.8 (*)    GFR, Estimated 33 (*)    All other components within normal limits  PROTIME-INR - Abnormal; Notable for the following components:   Prothrombin Time 21.9 (*)    INR 1.9 (*)    All other  components within normal limits    EKG None  Radiology DG Elbow Complete Right  Result Date: 09/26/2020 CLINICAL DATA:  chest, pelvis pain after fall, right hand injury and right elbow EXAM: RIGHT ELBOW - COMPLETE 3+ VIEW COMPARISON:  None. FINDINGS: There is no evidence of acute fracture. There is no significant joint effusion. There is mild soft tissue swelling posteriorly along the olecranon. There is enthesophyte formation at the distal triceps insertion. There is mild radiocapitellar teller and ulnotrochlear degenerative change. IMPRESSION: No evidence of acute fracture or significant joint effusion. Mild soft tissue swelling posteriorly along the olecranon. Electronically Signed   By: Maurine Simmering   On: 09/26/2020 13:00   CT HEAD WO CONTRAST  Result Date: 09/26/2020 CLINICAL DATA:  Fall, head trauma EXAM: CT HEAD WITHOUT CONTRAST TECHNIQUE: Contiguous axial images were obtained from the base of the skull through the vertex without intravenous contrast. COMPARISON:  08/13/2020 FINDINGS: Brain: No evidence of acute infarction, hemorrhage, hydrocephalus, extra-axial collection or mass lesion/mass effect. Mild-moderate low-density changes within the periventricular and subcortical white matter compatible with chronic microvascular ischemic change. Moderate diffuse cerebral volume loss. Vascular: Atherosclerotic calcifications involving the large vessels of the skull base. No unexpected hyperdense vessel. Skull: Normal. Negative for fracture or focal lesion. Sinuses/Orbits: No acute finding. Other: Small scalp hematoma overlies the superior right frontal bone. IMPRESSION: 1. No acute intracranial findings. 2. Small scalp hematoma overlies the superior right frontal bone. No underlying calvarial fracture. 3. Chronic microvascular ischemic change and cerebral volume loss. Electronically Signed   By: Davina Poke D.O.   On: 09/26/2020 14:04   CT CERVICAL SPINE WO CONTRAST  Result Date:  09/26/2020 CLINICAL DATA:  Status post fall.  Initial encounter. EXAM: CT CERVICAL SPINE WITHOUT CONTRAST TECHNIQUE: Multidetector CT imaging of the cervical spine was performed without intravenous contrast. Multiplanar CT image reconstructions were also generated. COMPARISON:  CT cervical spine 09/02/2017. FINDINGS: Alignment: Normal. Skull base and vertebrae: No acute fracture. No primary bone lesion or focal pathologic process. Soft tissues and spinal canal: No prevertebral fluid or swelling. No visible canal hematoma. Disc levels: Mild multilevel disc bulging does not appear markedly changed. Upper chest: Lung apices clear. Other: None. IMPRESSION: No acute abnormality. Mild appearing degenerative disease. Electronically Signed   By: Inge Rise M.D.   On: 09/26/2020 14:17   DG Pelvis Portable  Result Date: 09/26/2020 CLINICAL DATA:  Pelvic pain. EXAM: PORTABLE PELVIS 1-2 VIEWS COMPARISON:  08/13/2020 FINDINGS: No acute fracture or dislocation. No aggressive osseous lesion. Normal alignment. Generalized osteopenia. Moderate osteoarthritis of the right hip. Mild osteoarthritis of the left hip. Soft tissue are unremarkable. No radiopaque foreign body or soft tissue emphysema. IMPRESSION: No acute osseous injury of the pelvis. Given the patient's age and osteopenia, if there is persistent clinical concern for an occult hip fracture, a MRI of the hip is recommended for increased sensitivity. Electronically Signed   By: Kathreen Devoid   On: 09/26/2020 13:00   DG BONE DENSITY (DXA)  Result Date: 09/26/2020 EXAM: DUAL X-RAY ABSORPTIOMETRY (DXA) FOR BONE MINERAL DENSITY IMPRESSION: Referring Physician:  [place] Your  patient completed a bone mineral density test using GE Lunar iDXA system (analysis version: 16). Technologist: [place] PATIENT: Name: [name], [name] Patient ID: [id number] Birth Date: 1930-08-30 Height: 60.5 in. Sex: Female Measured: 09/26/2020 Weight: 142.0 lbs. Indications: Advanced Age,  Aricept, Caucasian, Estrogen Deficient, Hypothyroid, Levothyroxine, Postmenopausal Fractures: None Treatments: None ASSESSMENT: The BMD measured at Forearm Radius 33% is 0.522 g/cm2 with a T-score of -4.1. This patient is considered osteoporotic according to [facility]) criteria. The quality of the exam is good. The lumbar spine was excluded due to degenerative changes. Site Region Measured Date Measured Age YA BMD Significant CHANGE T-score Left Forearm Radius 33% 09/26/2020 87.8 -4.1 0.522 g/cm2 DualFemur Total Left 09/26/2020 87.8 -3.4 0.579 g/cm2 DualFemur Total Mean 09/26/2020 87.8 -3.2 0.606 g/cm2 World Health Organization [facility]) criteria for post-menopausal, Caucasian Women: Normal       T-score at or above -1 SD Osteopenia   T-score between -1 and -2.5 SD Osteoporosis T-score at or below -2.5 SD RECOMMENDATION: 1. All patients should optimize calcium and vitamin D intake. 2. Consider FDA-approved medical therapies in postmenopausal women and men aged 33 years and older, based on the following: a. A hip or vertebral (clinical or morphometric) fracture. b. T-score = -2.5 at the femoral neck or spine after appropriate evaluation to exclude secondary causes. c. Low bone mass (T-score between -1.0 and -2.5 at the femoral neck or spine) and a 10-year probability of a hip fracture = 3% or a 10-year probability of a major osteoporosis-related fracture = 20% based on the [place]-adapted WHO algorithm. d. Clinician judgment and/or patient preferences may indicate treatment for people with 10-year fracture probabilities above or below these levels. FOLLOW-UP: Patients with diagnosis of osteoporosis or at high risk for fracture should have regular bone mineral density tests.? Patients eligible for Medicare are allowed routine testing every 2 years.? The testing frequency can be increased to one year for patients who have rapidly progressing disease, are receiving or discontinuing medical therapy to restore  bone mass, or have additional risk factors. I have reviewed this study and agree with the findings. Mark A. Thornton Papas, M.D. [facility] Electronically Signed   By: Lavonia Dana M.D.   On: 09/26/2020 15:09   DG Chest Portable 1 View  Result Date: 09/26/2020 CLINICAL DATA:  Chest pain status post fall EXAM: PORTABLE CHEST 1 VIEW COMPARISON:  05/09/2020 FINDINGS: Stable cardiomegaly. Single lead cardiac pacemaker. Single abandoned right-sided pacemaker lead. Lungs are hyperinflated as can be seen with COPD. Bilateral diffuse interstitial thickening. No pleural effusion or pneumothorax. No acute osseous abnormality. IMPRESSION: Cardiomegaly with mild pulmonary vascular congestion. Electronically Signed   By: Kathreen Devoid   On: 09/26/2020 12:58   DG Hand Complete Right  Result Date: 09/26/2020 CLINICAL DATA:  Trauma.  Right hand abrasions. EXAM: RIGHT HAND - COMPLETE 3+ VIEW COMPARISON:  Radiographs 08/13/2020. FINDINGS: The bones are demineralized. There is no evidence of acute fracture or dislocation. Multifocal degenerative changes are again noted involving all of the interphalangeal joints as well as the 1st carpometacarpal and scaphotrapeziotrapezoidal articulations. There is chondrocalcinosis of the triangular fibrocartilage. Chronic periarticular calcifications are present about the 2nd proximal interphalangeal joint as well as the 1st, 2nd, 3rd and 5th metacarpal phalangeal joints. No acute soft tissue findings are evident. IMPRESSION: 1. No acute findings identified. 2. Chronic osteoarthritis and scattered periarticular calcifications compatible with chronic calcific periarthritis/hydroxyapatite deposition. Electronically Signed   By: Richardean Sale M.D.   On: 09/26/2020 13:03  Procedures Procedures   Medications Ordered in ED Medications  acetaminophen (TYLENOL) tablet 500 mg (500 mg Oral Given 09/26/20 1239)    ED Course  I have reviewed the triage vital signs and the nursing  notes.  Pertinent labs & imaging results that were available during my care of the patient were reviewed by me and considered in my medical decision making (see chart for details).    MDM Rules/Calculators/A&P                          87 year old lady presented to ER as a level 2 trauma.  Reported head trauma and blood thinner use.  Patient on Eliquis.  On exam patient was well-appearing in no acute distress.  Noted a couple of superficial skin tears, right elbow and right hand.  Basic labs stable.  CT head negative, CT C-spine negative.  Screening chest and pelvis x-rays negative for fracture.  After the discussed management above, the patient was determined to be safe for discharge.  The patient was in agreement with this plan and all questions regarding their care were answered.  ED return precautions were discussed and the patient will return to the ED with any significant worsening of condition.   Final Clinical Impression(s) / ED Diagnoses Final diagnoses:  Fall, initial encounter  Skin tear of right elbow without complication, initial encounter    Rx / DC Orders ED Discharge Orders     None        Lucrezia Starch, MD 09/27/20 [phone]

## 2020-09-26 NOTE — ED Triage Notes (Signed)
Leaving medical appointment and was knocked down onto cement by an automatic door that closed on her, fell onto cement and hit her head, on blood thinners, NO LOC, Hematoma to right FA and multiple abrasions and skin tears noted to hand and arms, c-collar placed, 4 lead nl pacemaker.

## 2020-09-26 NOTE — ED Notes (Signed)
Patient remain A/A/O. Felicia Pounds patient's caregiver was called. She stated she is on her way to pick patient up and drive her home.

## 2020-09-29 ENCOUNTER — Encounter: Payer: Self-pay | Admitting: Family Medicine

## 2020-09-29 DIAGNOSIS — M810 Age-related osteoporosis without current pathological fracture: Secondary | ICD-10-CM | POA: Insufficient documentation

## 2020-10-01 ENCOUNTER — Ambulatory Visit: Payer: Medicare Other | Admitting: Family Medicine

## 2020-10-01 ENCOUNTER — Other Ambulatory Visit: Payer: Self-pay | Admitting: Family Medicine

## 2020-10-01 DIAGNOSIS — R0981 Nasal congestion: Secondary | ICD-10-CM

## 2020-10-02 ENCOUNTER — Encounter (HOSPITAL_COMMUNITY): Payer: Self-pay | Admitting: *Deleted

## 2020-10-02 ENCOUNTER — Ambulatory Visit (HOSPITAL_COMMUNITY)
Admission: EM | Admit: 2020-10-02 | Discharge: 2020-10-02 | Disposition: A | Discharge status: Home / Self Care | Payer: Medicare Other

## 2020-10-02 ENCOUNTER — Other Ambulatory Visit: Payer: Self-pay

## 2020-10-02 DIAGNOSIS — I959 Hypotension, unspecified: Secondary | ICD-10-CM | POA: Diagnosis not present

## 2020-10-02 DIAGNOSIS — E860 Dehydration: Secondary | ICD-10-CM

## 2020-10-02 NOTE — ED Notes (Signed)
Stopped by bathroom on her way out of building with caregiver.  Escorted patient in wheelchair and caregiver to car in parking lot.

## 2020-10-02 NOTE — ED Provider Notes (Addendum)
[facility]    CSN: [id number] Arrival date & time: 10/02/20  1236      History   Chief Complaint Chief Complaint  Patient presents with   Hypotension    HPI [name] is a 87 y.o. female.   Patient here for evaluation of low BP. Reports home reading was 88/49 after breakfast, BP 108/57 in office. Reports blood pressures have been on the lower end for the past several weeks.  Most recent cardiology visit, blood pressure medication was cut in half (lisinopril 10mg ).  Caregiver does reports some weakness and fatigue that has been ongoing for the past several weeks to months.  Patient denies any dizziness, weakness, fatigue.  Patient does report "unable to focus eyes" but that has been ongoing for several weeks.  Denies any dysuria, urgency, or frequency.  Denies any trauma, injury, or other precipitating event.  Denies any specific alleviating or aggravating factors.  Denies any fevers, chest pain, shortness of breath, N/V/D, numbness, tingling, weakness, abdominal pain, or headaches.     The history is provided by the patient and a caregiver.   Past Medical History:  Diagnosis Date   Aortic stenosis    Arrhythmia    chronic atrial fib   Atrial fibrillation, chronic (HCC)    Chronic anticoagulation    Chronic atrial fibrillation ([place]) 01/28/2017   Chronic venous insufficiency 01/28/2017   Clavicle fracture 76/28/3151   Complication of anesthesia    " difficult waking "   Long term current use of anticoagulant therapy 01/28/2017   Osteoarthritis    Presence of permanent cardiac pacemaker 01/28/2017   Original implant 1992 [employer] 431-04 Generator change 2001 and 2011.  Medtronic generator.    SSS (sick sinus syndrome) (HCC)    Thyroid disease    hypothyroidism   TIA (transient ischemic attack)     Patient Active Problem List   Diagnosis Date Noted   Osteoporosis 09/29/2020   Moderate dementia with behavioral disturbance ([place]) 09/04/2020    Severe aortic stenosis    Aortic stenosis 07/15/2020   CHF (congestive heart failure) ([place]) 06/05/2020   Left sided abdominal pain 05/13/2020   Sinus congestion 12/18/2019   Dementia without behavioral disturbance ([place]) 07/31/2019   Sick sinus syndrome ([place]) 07/28/2019   Epigastric pain 06/12/2019   Localized swelling of left lower leg 04/11/2019   Memory loss 04/11/2019   Prediabetes 03/14/2019   Complete heart block ([place]) 03/05/2019   TIA (transient ischemic attack) 01/15/2019   Gait abnormality 01/15/2019   Transient ischemia 12/13/2018   Dysarthria 12/11/2018   Hypothyroidism 12/11/2018   Arthritis of right hand 08/17/2018   Chronic back pain 04/11/2018   Encounter for therapeutic drug monitoring 03/09/2018   Hypertension 01/04/2018   Clavicle fracture 11/20/2017   Closed fracture of clavicle 11/20/2017   Fall    Accelerated hypertension    Carpal tunnel syndrome of left wrist 08/02/2017   Carpal tunnel syndrome of right wrist 08/02/2017   Bradycardia 02/02/2017   Presence of permanent cardiac pacemaker 01/28/2017   Chronic a-fib ([place]) 01/28/2017   Long term current use of anticoagulant therapy 01/28/2017   Chronic venous insufficiency 01/28/2017   Chronic atrial fibrillation ([place]) 01/28/2017   Peripheral venous insufficiency 01/28/2017   Cardiac pacemaker in situ 01/28/2017   Anticoagulated 01/28/2017   DOE (dyspnea on exertion) 09/22/2015   Dyspnea on exertion 09/22/2015   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 05/15/2014   After cataract of left eye not obscuring vision 05/15/2014  Bilateral dry eyes 05/15/2014   Dermatochalasis of eyelid 05/15/2014   Hyperopia of both eyes with astigmatism and presbyopia 05/15/2014   Irregular astigmatism of right eye 05/15/2014   Mechanical complication due to intraocular lens implant 05/15/2014   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   Vitreous syneresis of both eyes 05/15/2014    Past Surgical History:  Procedure Laterality Date    ABDOMINAL HYSTERECTOMY     INSERT / REPLACE / REMOVE PACEMAKER     generator change 2011 medtronic sigma   KNEE SURGERY     LEAD INSERTION N/A 03/05/2019   Procedure: LEAD INSERTION - RV LEAD;  Surgeon: Deboraha Sprang, MD;  Location: [facility];  Service: Cardiovascular;  Laterality: N/A;   PACEMAKER IMPLANT N/A 03/05/2019   Procedure: PACEMAKER IMPLANT;  Surgeon: Deboraha Sprang, MD;  Location: [facility];  Service: Cardiovascular;  Laterality: N/A;   PACEMAKER INSERTION     PARS PLANA VITRECTOMY Right 07/19/2018   Procedure: VITRECTOMY WITH VITREOUS BIPOSY & ENDOLASER;  Surgeon: Jalene Mullet, MD;  Location: [facility];  Service: Ophthalmology;  Laterality: Right;   PPM GENERATOR CHANGEOUT N/A 02/02/2017   Procedure: PPM GENERATOR CHANGEOUT;  Surgeon: Deboraha Sprang, MD;  Location: [facility];  Service: Cardiovascular;  Laterality: N/A;   REPLACEMENT TOTAL KNEE BILATERAL     RIGHT/LEFT HEART CATH AND CORONARY ANGIOGRAPHY N/A 07/31/2020   Procedure: RIGHT/LEFT HEART CATH AND CORONARY ANGIOGRAPHY;  Surgeon: Burnell Blanks, MD;  Location: [facility];  Service: Cardiovascular;  Laterality: N/A;   VARICOSE VEIN SURGERY      OB History     Gravida  3   Para  3   Term      Preterm      AB      Living  2      SAB      IAB      Ectopic      Multiple      Live Births               Home Medications    Prior to Admission medications   Medication Sig Start Date End Date Taking? Authorizing Provider  acetaminophen (TYLENOL) 500 MG tablet Take 1,000 mg by mouth every 8 (eight) hours as needed for moderate pain or headache.    Yes [provider]  atorvastatin (LIPITOR) 20 MG tablet TAKE 1 TABLET BY MOUTH EVERY DAY IN THE EVENING 06/30/20  Yes [name], MD  cetirizine (ZYRTEC) 5 MG tablet TAKE 1 TABLET BY MOUTH EVERY DAY 04/18/20  Yes [name], MD  Cyanocobalamin (B-12) 1000 MCG CAPS TAKE 1 CAPSULE BY MOUTH EVERY  DAY 03/17/20  Yes [name], MD  D 2000 50 MCG (2000 UT) TABS TAKE 1 TABLET BY MOUTH EVERY DAY 07/07/20  Yes [name], MD  ELIQUIS 5 MG TABS tablet TAKE 1 TABLET BY MOUTH TWICE A DAY 08/15/20  Yes [name], MD  fluticasone (FLONASE) 50 MCG/ACT nasal spray PLACE 1 SPRAY INTO BOTH NOSTRILS DAILY AS NEEDED FOR ALLERGIES OR RHINITIS. 09/25/20  Yes [name], MD  furosemide (LASIX) 20 MG tablet TAKE 1 TABLET BY MOUTH EVERY DAY 08/21/20  Yes [name], MD  levothyroxine (SYNTHROID) 88 MCG tablet TAKE 1 TABLET BY MOUTH EVERY DAY IN THE MORNING ON AN EMPTY STOMACH 08/04/20  Yes [name], MD  lisinopril (ZESTRIL) 20 MG tablet Take 0.5 tablets (10 mg total) by  mouth daily. 09/18/20  Yes Deboraha Sprang, MD  oxybutynin (DITROPAN-XL) 5 MG 24 hr tablet TAKE 1 TABLET BY MOUTH EVERY DAY 06/05/20  Yes [name], MD  sertraline (ZOLOFT) 50 MG tablet Take 1 tablet (50 mg total) by mouth at bedtime. 05/08/20  Yes Cameron Sprang, MD  Carboxymethylcell-Hypromellose (GENTEAL OP) Place 1 drop into both eyes daily as needed (dry eyes).    [provider]  donepezil (ARICEPT) 10 MG tablet Take 1 tablet daily 04/18/20   Cameron Sprang, MD    Family History Family History  Problem Relation Age of Onset   Diabetes Mother    Mental illness Mother    Diabetes Father    Heart disease Father     Social History Social History   Tobacco Use   Smoking status: Former    Packs/day: 0.25    Years: 10.00    Pack years: 2.50    Types: Cigarettes    Quit date: 1980    Years since quitting: 42.5   Smokeless tobacco: Never  Vaping Use   Vaping Use: Never used  Substance Use Topics   Alcohol use: No   Drug use: No     Allergies   Adhesive [tape] and Penicillins   Review of Systems Review of Systems  Constitutional:  Positive for fatigue.  Eyes:  Negative for visual disturbance.  Respiratory:  Negative for cough, chest tightness and shortness of breath.    Cardiovascular:  Negative for chest pain and palpitations.  Gastrointestinal:  Negative for abdominal pain.  Neurological:  Negative for dizziness, numbness and headaches.  All other systems reviewed and are negative.   Physical Exam Triage Vital Signs ED Triage Vitals  Enc Vitals Group     BP 10/02/20 1300 (!) 108/57     Pulse Rate 10/02/20 1300 71     Resp 10/02/20 1300 18     Temp 10/02/20 1300 97.8 F (36.6 C)     Temp Source 10/02/20 1300 Oral     SpO2 10/02/20 1300 99 %     Weight --      Height --      Head Circumference --      Peak Flow --      Pain Score 10/02/20 1301 0     Pain Loc --      Pain Edu? --      Excl. in [place]? --    No data found.  Updated Vital Signs BP (!) 108/57   Pulse 71   Temp 97.8 F (36.6 C) (Oral)   Resp 18   SpO2 99%   Visual Acuity Right Eye Distance:   Left Eye Distance:   Bilateral Distance:    Right Eye Near:   Left Eye Near:    Bilateral Near:     Physical Exam Vitals and nursing note reviewed.  Constitutional:      General: She is not in acute distress.    Appearance: Normal appearance. She is not ill-appearing, toxic-appearing or diaphoretic.  HENT:     Head: Normocephalic and atraumatic.     Nose: Nose normal.     Mouth/Throat:     Mouth: Mucous membranes are moist.     Pharynx: Oropharynx is clear.  Eyes:     Extraocular Movements: Extraocular movements intact.     Conjunctiva/sclera: Conjunctivae normal.     Pupils: Pupils are equal, round, and reactive to light.  Cardiovascular:     Rate and Rhythm: Normal rate.  Pulses: Normal pulses.     Heart sounds: Normal heart sounds.  Pulmonary:     Effort: Pulmonary effort is normal.     Breath sounds: Normal breath sounds.  Abdominal:     General: Abdomen is flat.  Musculoskeletal:        General: Normal range of motion.     Cervical back: Normal range of motion.  Skin:    General: Skin is warm and dry.  Neurological:     General: No focal deficit  present.     Mental Status: She is alert and oriented to person, place, and time.     Cranial Nerves: No cranial nerve deficit.  Psychiatric:        Mood and Affect: Mood normal.     UC Treatments / Results  Labs (all labs ordered are listed, but only abnormal results are displayed) Labs Reviewed - No data to display  EKG   Radiology No results found.  Procedures Procedures (including critical care time)  Medications Ordered in UC Medications - No data to display  Initial Impression / Assessment and Plan / UC Course  I have reviewed the triage vital signs and the nursing notes.  Pertinent labs & imaging results that were available during my care of the patient were reviewed by me and considered in my medical decision making (see chart for details).    Assessment negative for red flags or concerns.  Hypotension and possible dehydration.  Caregiver reports that patient takes half of a lisinopril in the morning and at night.  Recommend skipping next dose of lisinopril.  If BP is less than 100/60, do not take lisinopril.  Make sure that patient is drinking plenty of fluids, especially water.  Patient has doctor's appointment on Monday and encouraged to ask about necessity of BP pills at that time.  Strict ED follow up for any headaches, chest pain, shortness of breath, dizziness, altered mental status, dysarthia, or weakness.   Final Clinical Impressions(s) / UC Diagnoses   Final diagnoses:  Hypotension, unspecified hypotension type  Dehydration     Discharge Instructions      You may be dehydrated.  Make sure you are drinking plenty of fluids, especially water.    Skip your night dose of blood pressure medication.  Check your blood pressure tomorrow morning before you take the morning dose and if it is still less than 100/60, skip the morning dose as well.   Follow up with your doctor on Monday for further evaluation.   If you develop any chest pain, shortness of breath,  inability to speak, dizziness, headaches, numbness, weakness, or altered mental status, call 911 or go to the Emergency department for further evaluation.      ED Prescriptions   None    PDMP not reviewed this encounter.   Pearson Forster, NP 10/02/20 1350    Pearson Forster, NP 10/02/20 1351

## 2020-10-02 NOTE — Discharge Instructions (Addendum)
You may be dehydrated.  Make sure you are drinking plenty of fluids, especially water.    Skip your night dose of blood pressure medication.  Check your blood pressure tomorrow morning before you take the morning dose and if it is still less than 100/60, skip the morning dose as well.   Follow up with your doctor on Monday for further evaluation.   If you develop any chest pain, shortness of breath, inability to speak, dizziness, headaches, numbness, weakness, or altered mental status, call 911 or go to the Emergency department for further evaluation.

## 2020-10-02 NOTE — ED Triage Notes (Signed)
Pt here with CG because reported BP at home was 88/49 after eating morning meal.

## 2020-10-06 ENCOUNTER — Other Ambulatory Visit: Payer: Self-pay

## 2020-10-06 ENCOUNTER — Telehealth: Payer: Self-pay | Admitting: Family Medicine

## 2020-10-06 ENCOUNTER — Ambulatory Visit (INDEPENDENT_AMBULATORY_CARE_PROVIDER_SITE_OTHER): Payer: Medicare Other | Admitting: Family Medicine

## 2020-10-06 VITALS — BP 106/62 | HR 68 | Temp 97.3°F | Resp 18 | Wt 142.8 lb

## 2020-10-06 DIAGNOSIS — S50311D Abrasion of right elbow, subsequent encounter: Secondary | ICD-10-CM

## 2020-10-06 DIAGNOSIS — H02403 Unspecified ptosis of bilateral eyelids: Secondary | ICD-10-CM

## 2020-10-06 DIAGNOSIS — I10 Essential (primary) hypertension: Secondary | ICD-10-CM | POA: Diagnosis not present

## 2020-10-06 DIAGNOSIS — M810 Age-related osteoporosis without current pathological fracture: Secondary | ICD-10-CM | POA: Diagnosis not present

## 2020-10-06 DIAGNOSIS — S50311A Abrasion of right elbow, initial encounter: Secondary | ICD-10-CM | POA: Insufficient documentation

## 2020-10-06 MED ORDER — LISINOPRIL 5 MG PO TABS: 5.0000 mg | ORAL_TABLET | Freq: Every day | ORAL | Status: DC

## 2020-10-06 MED ORDER — LISINOPRIL 5 MG PO TABS: 5.0000 mg | ORAL_TABLET | Freq: Every day | ORAL | 3 refills | Status: AC

## 2020-10-06 NOTE — Assessment & Plan Note (Signed)
Pt not sure if she has ever received treatment. Daughter will get AVS for review. Advised fosamax if never treated and information provided.

## 2020-10-06 NOTE — Assessment & Plan Note (Signed)
Constant complaint for patient. She would like a second opinion. Hopeful that if they decline surgery this will allow her to seek care for things that can be addressed.

## 2020-10-06 NOTE — Patient Instructions (Addendum)
#  Abrasion on Arm - use non-adhesive bandage  - tape - let air out as much as possible - if redness, worsening pain, swelling   Referral to ophthalmology for second opinion placed    Bone Density shows Osteoporosis - if you have never been on Fosamax (Alendronate) you should consider taking  -- If she has taken or been treated in the past we can place a referral to Endocrinology to help.  -- Call back to update and let me know what she would like to do   Osteoporosis increases your risk of having a fracture.   Bisphosphonate therapy is the first line treatment for prevention of fractures.   Alendronate (daily or weekly) Risedronate (daily or weekly) Zoledronic acid (IV 5 mg yearly)  Common Side effects: Heartburn and Acid Reflux symptoms  Less common:  1) Osteonecrosis of the Jaw - presents with jaw pain, swelling, local infection 2) Femur Fracture 3) Ocular Side effects - pain, blurred vision, conjunctivitis  Non-medication options below:   Would recommend the following:   1) 800 units of Vitamin D daily 2) Get 1200 mg of elemental calcium --- this is best from your diet. Try to track how much calcium you get on a typical day. You could find ways to add more (dairy products, leafy greens). Take a supplement for whatever you don't typically get so you reach 1200 mg of calcium.  3) Physical activity (ideally weight bearing) - like walking briskly 30 minutes 5 days a week.   Stop taking the Vitamin B12

## 2020-10-06 NOTE — Telephone Encounter (Signed)
Please review

## 2020-10-06 NOTE — Assessment & Plan Note (Signed)
BP low. Decrease lisinopril 10 mg > 5mg . Cont lasix but may need to hold in the future if still having lightheadedness/dehydration.

## 2020-10-06 NOTE — Telephone Encounter (Signed)
[facility] for lisinopril (ZESTRIL) 5 MG tablet

## 2020-10-06 NOTE — Assessment & Plan Note (Signed)
Discussed allowing to air, neosporin prn, no signs of infection. Change dressing every 2 days.

## 2020-10-06 NOTE — Progress Notes (Signed)
Subjective:     [name] is a 87 y.o. female presenting for ER follow up (F/u b/p, fall)     HPI  # eye lid - referral for second opinion  #Hypotension - continues to take lasix - has not been taking lisinopril if bp <100/60 - Saturday 118/74, Sunday 126/72 - did take lisinopril 10 mg this morning - endorses some lightheadedness - continues to feel like she may fall - tries to drink - got up to drink water last night - care giver - is encouraging water, weekend caregiver reports she seemed to do well over the weekend - endorses some sob  Review of Systems  09/26/2020: ER - fall - head injury - CT negative, hematoma. Ischemic change. XR pelvis - no fracture but consider mri if persistent pain. Elbow, hand (right) w/o fractures. CXR cardiomegaly and vascular congestion 10/02/2020: ER - Hypotension/dehydration - advised reducing lisinopril dosing and to drink water  Social History   Tobacco Use  Smoking Status Former   Packs/day: 0.25   Years: 10.00   Pack years: 2.50   Types: Cigarettes   Quit date: 1980   Years since quitting: 42.5  Smokeless Tobacco Never        Objective:    BP Readings from Last 3 Encounters:  10/06/20 106/62  10/02/20 (!) 108/57  09/26/20 135/87   Wt Readings from Last 3 Encounters:  10/06/20 142 lb 12 oz (64.8 kg)  09/18/20 140 lb (63.5 kg)  09/04/20 146 lb (66.2 kg)    BP 106/62   Pulse 68   Temp (!) 97.3 F (36.3 C)   Resp 18   Wt 142 lb 12 oz (64.8 kg)   BMI 26.11 kg/m    Physical Exam Constitutional:      General: She is not in acute distress.    Appearance: She is well-developed. She is not diaphoretic.  HENT:     Right Ear: External ear normal.     Left Ear: External ear normal.     Nose: Nose normal.  Eyes:     Conjunctiva/sclera: Conjunctivae normal.  Cardiovascular:     Rate and Rhythm: Normal rate and regular rhythm.     Heart sounds: Murmur heard.  Pulmonary:     Effort: Pulmonary effort is  normal. No respiratory distress.     Breath sounds: Rales present. No wheezing.  Musculoskeletal:     Cervical back: Neck supple.     Right lower leg: No edema.     Left lower leg: No edema.  Skin:    General: Skin is warm and dry.     Capillary Refill: Capillary refill takes less than 2 seconds.     Comments: Right elbow abrasion - no sign of infection. Appears to be healing  Neurological:     Mental Status: She is alert. Mental status is at baseline.  Psychiatric:        Mood and Affect: Mood normal.        Behavior: Behavior normal.          Assessment & Plan:   Problem List Items Addressed This Visit       Cardiovascular and Mediastinum   Hypertension    BP low. Decrease lisinopril 10 mg > 5mg . Cont lasix but may need to hold in the future if still having lightheadedness/dehydration.        Relevant Medications   lisinopril (ZESTRIL) 5 MG tablet     Musculoskeletal and Integument   Osteoporosis  Pt not sure if she has ever received treatment. Daughter will get AVS for review. Advised fosamax if never treated and information provided.        Abrasion of right elbow    Discussed allowing to air, neosporin prn, no signs of infection. Change dressing every 2 days.          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 She would like a second opinion. Hopeful that if they decline surgery this will allow her to seek care for things that can be addressed.        Relevant Orders   Ambulatory referral to Ophthalmology     Return in about 3 months (around 01/06/2021) for blood pressure.  [name], MD  This visit occurred during the SARS-CoV-2 public health emergency.  Safety protocols were in place, including screening questions prior to the visit, additional usage of staff PPE, and extensive cleaning of exam room while observing appropriate contact time as indicated for disinfecting solutions.

## 2020-10-06 NOTE — Telephone Encounter (Signed)
Unintentionally had "no print" selected. [facility]. Sorry for the delay

## 2020-10-07 NOTE — Telephone Encounter (Signed)
Left detailed message on patient's voicemail advising of this.

## 2020-10-13 ENCOUNTER — Encounter: Payer: Self-pay | Admitting: Physician Assistant

## 2020-10-15 ENCOUNTER — Encounter (HOSPITAL_COMMUNITY): Payer: Self-pay

## 2020-10-15 ENCOUNTER — Institutional Professional Consult (permissible substitution) (INDEPENDENT_AMBULATORY_CARE_PROVIDER_SITE_OTHER): Payer: Medicare Other | Admitting: Surgery

## 2020-10-15 ENCOUNTER — Other Ambulatory Visit: Payer: Self-pay

## 2020-10-15 ENCOUNTER — Emergency Department (HOSPITAL_COMMUNITY): Payer: Medicare Other

## 2020-10-15 ENCOUNTER — Observation Stay (HOSPITAL_COMMUNITY)
Admission: EM | Admit: 2020-10-15 | Discharge: 2020-10-16 | Disposition: A | Discharge status: Home / Self Care | Payer: Medicare Other | Attending: Internal Medicine | Admitting: Internal Medicine

## 2020-10-15 ENCOUNTER — Observation Stay (HOSPITAL_COMMUNITY): Payer: Medicare Other

## 2020-10-15 ENCOUNTER — Ambulatory Visit: Payer: Medicare Other | Attending: Cardiovascular Disease | Admitting: Physical Therapy

## 2020-10-15 ENCOUNTER — Encounter: Payer: Self-pay | Admitting: Surgery

## 2020-10-15 VITALS — BP 86/50 | HR 54 | Resp 20 | Ht 62.0 in | Wt 142.0 lb

## 2020-10-15 DIAGNOSIS — R531 Weakness: Secondary | ICD-10-CM | POA: Diagnosis not present

## 2020-10-15 DIAGNOSIS — E039 Hypothyroidism, unspecified: Secondary | ICD-10-CM | POA: Insufficient documentation

## 2020-10-15 DIAGNOSIS — R404 Transient alteration of awareness: Secondary | ICD-10-CM | POA: Diagnosis not present

## 2020-10-15 DIAGNOSIS — I130 Hypertensive heart and chronic kidney disease with heart failure and stage 1 through stage 4 chronic kidney disease, or unspecified chronic kidney disease: Secondary | ICD-10-CM | POA: Insufficient documentation

## 2020-10-15 DIAGNOSIS — N1831 Chronic kidney disease, stage 3a: Secondary | ICD-10-CM | POA: Insufficient documentation

## 2020-10-15 DIAGNOSIS — Z950 Presence of cardiac pacemaker: Secondary | ICD-10-CM | POA: Diagnosis not present

## 2020-10-15 DIAGNOSIS — I482 Chronic atrial fibrillation, unspecified: Secondary | ICD-10-CM | POA: Diagnosis not present

## 2020-10-15 DIAGNOSIS — R4781 Slurred speech: Secondary | ICD-10-CM

## 2020-10-15 DIAGNOSIS — R4701 Aphasia: Secondary | ICD-10-CM

## 2020-10-15 DIAGNOSIS — Z7901 Long term (current) use of anticoagulants: Secondary | ICD-10-CM | POA: Insufficient documentation

## 2020-10-15 DIAGNOSIS — Z79899 Other long term (current) drug therapy: Secondary | ICD-10-CM | POA: Insufficient documentation

## 2020-10-15 DIAGNOSIS — I6621 Occlusion and stenosis of right posterior cerebral artery: Secondary | ICD-10-CM | POA: Diagnosis not present

## 2020-10-15 DIAGNOSIS — I509 Heart failure, unspecified: Secondary | ICD-10-CM | POA: Diagnosis not present

## 2020-10-15 DIAGNOSIS — R29810 Facial weakness: Secondary | ICD-10-CM | POA: Diagnosis not present

## 2020-10-15 DIAGNOSIS — F0391 Unspecified dementia with behavioral disturbance: Secondary | ICD-10-CM | POA: Insufficient documentation

## 2020-10-15 DIAGNOSIS — Z20822 Contact with and (suspected) exposure to covid-19: Secondary | ICD-10-CM | POA: Diagnosis not present

## 2020-10-15 DIAGNOSIS — Y900 Blood alcohol level of less than 20 mg/100 ml: Secondary | ICD-10-CM | POA: Insufficient documentation

## 2020-10-15 DIAGNOSIS — G459 Transient cerebral ischemic attack, unspecified: Secondary | ICD-10-CM | POA: Diagnosis not present

## 2020-10-15 DIAGNOSIS — Z87891 Personal history of nicotine dependence: Secondary | ICD-10-CM | POA: Insufficient documentation

## 2020-10-15 DIAGNOSIS — R4182 Altered mental status, unspecified: Secondary | ICD-10-CM | POA: Diagnosis not present

## 2020-10-15 DIAGNOSIS — R7303 Prediabetes: Secondary | ICD-10-CM | POA: Diagnosis not present

## 2020-10-15 DIAGNOSIS — I350 Nonrheumatic aortic (valve) stenosis: Secondary | ICD-10-CM | POA: Diagnosis not present

## 2020-10-15 DIAGNOSIS — R9431 Abnormal electrocardiogram [ECG] [EKG]: Secondary | ICD-10-CM | POA: Diagnosis not present

## 2020-10-15 DIAGNOSIS — I6523 Occlusion and stenosis of bilateral carotid arteries: Secondary | ICD-10-CM | POA: Diagnosis not present

## 2020-10-15 LAB — DIFFERENTIAL
Abs Immature Granulocytes: 0.06 10*3/uL (ref 0.00–0.07)
Basophils Absolute: 0.1 10*3/uL (ref 0.0–0.1)
Basophils Relative: 1 %
Eosinophils Absolute: 0 10*3/uL (ref 0.0–0.5)
Eosinophils Relative: 0 %
Immature Granulocytes: 1 %
Lymphocytes Relative: 5 %
Lymphs Abs: 0.5 10*3/uL — ABNORMAL LOW (ref 0.7–4.0)
Monocytes Absolute: 0.9 10*3/uL (ref 0.1–1.0)
Monocytes Relative: 9 %
Neutro Abs: 7.8 10*3/uL — ABNORMAL HIGH (ref 1.7–7.7)
Neutrophils Relative %: 84 %

## 2020-10-15 LAB — COMPREHENSIVE METABOLIC PANEL
ALT: 13 U/L (ref 0–44)
AST: 24 U/L (ref 15–41)
Albumin: 3.5 g/dL (ref 3.5–5.0)
Alkaline Phosphatase: 55 U/L (ref 38–126)
Anion gap: 10 (ref 5–15)
BUN: 18 mg/dL (ref 8–23)
CO2: 27 mmol/L (ref 22–32)
Calcium: 8.9 mg/dL (ref 8.9–10.3)
Chloride: 92 mmol/L — ABNORMAL LOW (ref 98–111)
Creatinine, Ser: 1.32 mg/dL — ABNORMAL HIGH (ref 0.44–1.00)
GFR, Estimated: 39 mL/min — ABNORMAL LOW (ref >60)
Glucose, Bld: 109 mg/dL — ABNORMAL HIGH (ref 70–99)
Potassium: 3.9 mmol/L (ref 3.5–5.1)
Sodium: 129 mmol/L — ABNORMAL LOW (ref 135–145)
Total Bilirubin: 1.2 mg/dL (ref 0.3–1.2)
Total Protein: 6.5 g/dL (ref 6.5–8.1)

## 2020-10-15 LAB — I-STAT CHEM 8, ED
BUN: 20 mg/dL (ref 8–23)
Calcium, Ion: 1.05 mmol/L — ABNORMAL LOW (ref 1.15–1.40)
Chloride: 92 mmol/L — ABNORMAL LOW (ref 98–111)
Creatinine, Ser: 1.3 mg/dL — ABNORMAL HIGH (ref 0.44–1.00)
Glucose, Bld: 107 mg/dL — ABNORMAL HIGH (ref 70–99)
HCT: 34 % — ABNORMAL LOW (ref 36.0–46.0)
Hemoglobin: 11.6 g/dL — ABNORMAL LOW (ref 12.0–15.0)
Potassium: 3.9 mmol/L (ref 3.5–5.1)
Sodium: 129 mmol/L — ABNORMAL LOW (ref 135–145)
TCO2: 28 mmol/L (ref 22–32)

## 2020-10-15 LAB — CBC
HCT: 33.9 % — ABNORMAL LOW (ref 36.0–46.0)
Hemoglobin: 10.6 g/dL — ABNORMAL LOW (ref 12.0–15.0)
MCH: 26.6 pg (ref 26.0–34.0)
MCHC: 31.3 g/dL (ref 30.0–36.0)
MCV: 85 fL (ref 80.0–100.0)
Platelets: 222 10*3/uL (ref 150–400)
RBC: 3.99 MIL/uL (ref 3.87–5.11)
RDW: 19.7 % — ABNORMAL HIGH (ref 11.5–15.5)
WBC: 9.2 10*3/uL (ref 4.0–10.5)
nRBC: 0 % (ref 0.0–0.2)

## 2020-10-15 LAB — URINALYSIS, ROUTINE W REFLEX MICROSCOPIC
Bilirubin Urine: NEGATIVE
Glucose, UA: NEGATIVE mg/dL
Ketones, ur: NEGATIVE mg/dL
Nitrite: NEGATIVE
Protein, ur: NEGATIVE mg/dL
Specific Gravity, Urine: 1.009 (ref 1.005–1.030)
pH: 8 (ref 5.0–8.0)

## 2020-10-15 LAB — RAPID URINE DRUG SCREEN, HOSP PERFORMED
Amphetamines: NOT DETECTED
Barbiturates: NOT DETECTED
Benzodiazepines: NOT DETECTED
Cocaine: NOT DETECTED
Opiates: NOT DETECTED
Tetrahydrocannabinol: NOT DETECTED

## 2020-10-15 LAB — APTT: aPTT: 41 seconds — ABNORMAL HIGH (ref 24–36)

## 2020-10-15 LAB — RETICULOCYTES
Immature Retic Fract: 25.9 % — ABNORMAL HIGH (ref 2.3–15.9)
RBC.: 4.06 MIL/uL (ref 3.87–5.11)
Retic Count, Absolute: 98.7 10*3/uL (ref 19.0–186.0)
Retic Ct Pct: 2.4 % (ref 0.4–3.1)

## 2020-10-15 LAB — CBG MONITORING, ED: Glucose-Capillary: 112 mg/dL — ABNORMAL HIGH (ref 70–99)

## 2020-10-15 LAB — PROTIME-INR
INR: 2.2 — ABNORMAL HIGH (ref 0.8–1.2)
Prothrombin Time: 24.2 seconds — ABNORMAL HIGH (ref 11.4–15.2)

## 2020-10-15 LAB — RESP PANEL BY RT-PCR (FLU A&B, COVID) ARPGX2
Influenza A by PCR: NEGATIVE
Influenza B by PCR: NEGATIVE
SARS Coronavirus 2 by RT PCR: NEGATIVE

## 2020-10-15 LAB — ETHANOL: Alcohol, Ethyl (B): <10 mg/dL (ref <10)

## 2020-10-15 MED ORDER — HYDRALAZINE HCL 25 MG PO TABS: 25.0000 mg | ORAL_TABLET | Freq: Four times a day (QID) | ORAL | Status: DC | PRN

## 2020-10-15 MED ORDER — DONEPEZIL HCL 10 MG PO TABS: 10.0000 mg | ORAL_TABLET | Freq: Every day | ORAL | Status: DC

## 2020-10-15 MED ORDER — FUROSEMIDE 20 MG PO TABS: 20.0000 mg | ORAL_TABLET | Freq: Every day | ORAL | Status: DC

## 2020-10-15 MED ORDER — CARBOXYMETHYLCELL-HYPROMELLOSE 0.25-0.3 % OP GEL: Freq: Every day | OPHTHALMIC | Status: DC | PRN

## 2020-10-15 MED ORDER — IOHEXOL 350 MG/ML SOLN
75.0000 mL | Freq: Once | INTRAVENOUS | Status: AC | PRN
  Administered 2020-10-15: 75 mL via INTRAVENOUS

## 2020-10-15 MED ORDER — STROKE: EARLY STAGES OF RECOVERY BOOK: Freq: Once | Status: DC

## 2020-10-15 MED ORDER — ACETAMINOPHEN 325 MG PO TABS: 650.0000 mg | ORAL_TABLET | ORAL | Status: DC | PRN

## 2020-10-15 MED ORDER — FLUTICASONE PROPIONATE 50 MCG/ACT NA SUSP: 1.0000 | Freq: Every day | NASAL | Status: DC | PRN

## 2020-10-15 MED ORDER — OXYBUTYNIN CHLORIDE ER 5 MG PO TB24: 5.0000 mg | ORAL_TABLET | Freq: Every day | ORAL | Status: DC

## 2020-10-15 MED ORDER — FUROSEMIDE 20 MG PO TABS
20.0000 mg | ORAL_TABLET | Freq: Every day | ORAL | Status: DC
  Administered 2020-10-16: 20 mg via ORAL
  Filled 2020-10-15: qty 1

## 2020-10-15 MED ORDER — LORAZEPAM 2 MG/ML IJ SOLN: 0.5000 mg | INTRAMUSCULAR | Status: DC | PRN

## 2020-10-15 MED ORDER — LORATADINE 10 MG PO TABS
10.0000 mg | ORAL_TABLET | Freq: Every day | ORAL | Status: DC
  Administered 2020-10-16: 10 mg via ORAL
  Filled 2020-10-15: qty 1

## 2020-10-15 MED ORDER — SODIUM CHLORIDE 0.9 % IV SOLN: INTRAVENOUS | Status: DC

## 2020-10-15 MED ORDER — APIXABAN 5 MG PO TABS: 5.0000 mg | ORAL_TABLET | Freq: Two times a day (BID) | ORAL | Status: DC

## 2020-10-15 MED ORDER — OXYBUTYNIN CHLORIDE ER 5 MG PO TB24
5.0000 mg | ORAL_TABLET | Freq: Every day | ORAL | Status: DC
  Administered 2020-10-16: 5 mg via ORAL
  Filled 2020-10-15 (×2): qty 1

## 2020-10-15 MED ORDER — HYDRALAZINE HCL 20 MG/ML IJ SOLN: 5.0000 mg | Freq: Four times a day (QID) | INTRAMUSCULAR | Status: DC | PRN

## 2020-10-15 MED ORDER — SENNOSIDES-DOCUSATE SODIUM 8.6-50 MG PO TABS: 1.0000 | ORAL_TABLET | Freq: Every evening | ORAL | Status: DC | PRN

## 2020-10-15 MED ORDER — ACETAMINOPHEN 160 MG/5ML PO SOLN: 650.0000 mg | ORAL | Status: DC | PRN

## 2020-10-15 MED ORDER — LORAZEPAM 1 MG PO TABS
0.5000 mg | ORAL_TABLET | ORAL | Status: DC | PRN
Start: 2020-10-15 — End: 2020-10-15

## 2020-10-15 MED ORDER — POLYVINYL ALCOHOL 1.4 % OP SOLN
1.0000 [drp] | OPHTHALMIC | Status: DC | PRN
  Filled 2020-10-15: qty 15

## 2020-10-15 MED ORDER — SERTRALINE HCL 50 MG PO TABS: 50.0000 mg | ORAL_TABLET | Freq: Every day | ORAL | Status: DC

## 2020-10-15 MED ORDER — ATORVASTATIN CALCIUM 40 MG PO TABS
40.0000 mg | ORAL_TABLET | Freq: Every day | ORAL | Status: DC
  Administered 2020-10-16: 40 mg via ORAL
  Filled 2020-10-15: qty 1

## 2020-10-15 MED ORDER — ACETAMINOPHEN 650 MG RE SUPP: 650.0000 mg | RECTAL | Status: DC | PRN

## 2020-10-15 MED ORDER — ACETAMINOPHEN 500 MG PO TABS: 1000.0000 mg | ORAL_TABLET | Freq: Three times a day (TID) | ORAL | Status: DC | PRN

## 2020-10-15 MED ORDER — LEVOTHYROXINE SODIUM 88 MCG PO TABS
88.0000 ug | ORAL_TABLET | Freq: Every day | ORAL | Status: DC
  Administered 2020-10-16: 88 ug via ORAL
  Filled 2020-10-15: qty 1

## 2020-10-15 NOTE — ED Notes (Signed)
Pt AxO x3. Pt disoriented to year only. Pt denies pain. I updated her on plan for MRI. Pt denies further needs. Daughter remains at bedside.

## 2020-10-15 NOTE — H&P (Signed)
History and Physical    [name] [id number] DOB: 12/21/1930 DOA: 10/15/2020  PCP: [name], MD (Confirm with patient/family/NH records and if not entered, this has to be entered at [facility] point of entry) Patient coming from: Home  I have personally briefly reviewed patient's old medical records in [facility]  Chief Complaint: I feel ok  HPI: [name] is a 87 y.o. female with medical history significant of chronic A. Fib on Eliquis, SSS s/p PPM, severe aortic stenosis, HTN, mild dementia, presented with strokelike symptoms.  Patient was at thoracic surgery's office this morning alongside with her family to evaluate TAVR, when suddenly patient slumped to her left side, with right-sided facial droop and slurred speech.  Daughter at her side reported that could not understand what the patient was talking.  Patient remembered that she was sitting and suddenly started to feel " lightheaded and weird", "could not talk".  Daughter further reported that patient has had low blood pressure reading last week and her cardiology has cut down her lisinopril from 10 mg to 5 mg daily.  Since then, family has been checking her BP everyday and her BP remains to be on the lower side. This morning at home, her blood pressure 87/50.  And right after onset of her strokelike symptoms, staff at [facility]'s office check her blood pressure was 144 SBP. Her symptoms largely resolved in 2 hours. Right now, patient denied any LOC, numbness or weakness of any of the limbs, no chest pain or SOB.  At baseline, patient has had decompensated aortic stenosis recently with severe bilateral peripheral edema and worsening of exertional dyspnea.  Her cardiologist increased her Lasix from 20 mg daily to 20 mg twice daily for 3 days last week and leg swelling resolved but dyspnea symptoms remains the same.  ED Course: CT showed no acute findings but chronic microvascular disease, CT angiogram shows  severe right P2 PCA stenosis, CTA of neck bilaterally less than 50% stenosis.  Review of Systems: As per HPI otherwise 14 point review of systems negative.    Past Medical History:  Diagnosis Date   Chronic anticoagulation    Chronic atrial fibrillation ([place]) 01/28/2017   Chronic venous insufficiency 01/28/2017   Clavicle fracture 11/20/2017   Long term current use of anticoagulant therapy 01/28/2017   Osteoarthritis    Presence of permanent cardiac pacemaker 01/28/2017   Original implant 1992 [employer] 431-04 Generator change 2001 and 2011.  Medtronic generator.    Severe aortic stenosis    SSS (sick sinus syndrome) (HCC)    Thyroid disease    hypothyroidism   TIA (transient ischemic attack)     Past Surgical History:  Procedure Laterality Date   ABDOMINAL HYSTERECTOMY     INSERT / REPLACE / REMOVE PACEMAKER     generator change 2011 medtronic sigma   KNEE SURGERY     LEAD INSERTION N/A 03/05/2019   Procedure: LEAD INSERTION - RV LEAD;  Surgeon: Deboraha Sprang, MD;  Location: [facility];  Service: Cardiovascular;  Laterality: N/A;   PACEMAKER IMPLANT N/A 03/05/2019   Procedure: PACEMAKER IMPLANT;  Surgeon: Deboraha Sprang, MD;  Location: [facility];  Service: Cardiovascular;  Laterality: N/A;   PACEMAKER INSERTION     PARS PLANA VITRECTOMY Right 07/19/2018   Procedure: VITRECTOMY WITH VITREOUS BIPOSY & ENDOLASER;  Surgeon: Jalene Mullet, MD;  Location: [facility];  Service: Ophthalmology;  Laterality: Right;   PPM GENERATOR CHANGEOUT N/A  02/02/2017   Procedure: PPM GENERATOR CHANGEOUT;  Surgeon: Deboraha Sprang, MD;  Location: [facility];  Service: Cardiovascular;  Laterality: N/A;   REPLACEMENT TOTAL KNEE BILATERAL     RIGHT/LEFT HEART CATH AND CORONARY ANGIOGRAPHY N/A 07/31/2020   Procedure: RIGHT/LEFT HEART CATH AND CORONARY ANGIOGRAPHY;  Surgeon: Burnell Blanks, MD;  Location: [facility];  Service: Cardiovascular;  Laterality:  N/A;   VARICOSE VEIN SURGERY       reports that she quit smoking about 42 years ago. Her smoking use included cigarettes. She has a 2.50 pack-year smoking history. She has never used smokeless tobacco. She reports that she does not drink alcohol and does not use drugs.  Allergies  Allergen Reactions   Adhesive [Tape] Other (See Comments)    TAPE WILL TEAR THE SKIN!!!!   Penicillins Hives and Rash    Did it involve swelling of the face/tongue/throat, SOB, or low BP? No Did it involve sudden or severe rash/hives, skin peeling, or any reaction on the inside of your mouth or nose? Yes Did you need to seek medical attention at a hospital or doctor's office? Unknown  When did it last happen?      unknown  If all above answers are "NO", may proceed with cephalosporin use.     Family History  Problem Relation Age of Onset   Diabetes Mother    Mental illness Mother    Diabetes Father    Heart disease Father      Prior to Admission medications   Medication Sig Start Date End Date Taking? Authorizing Provider  acetaminophen (TYLENOL) 500 MG tablet Take 1,000 mg by mouth every 8 (eight) hours as needed for moderate pain or headache.    Yes [provider]  atorvastatin (LIPITOR) 20 MG tablet TAKE 1 TABLET BY MOUTH EVERY DAY IN THE EVENING 06/30/20  Yes [name], MD  Carboxymethylcell-Hypromellose (GENTEAL OP) Place 1 drop into both eyes daily as needed (dry eyes).   Yes [provider]  cetirizine (ZYRTEC) 5 MG tablet TAKE 1 TABLET BY MOUTH EVERY DAY 04/18/20  Yes [name], MD  D 2000 50 MCG [phone] UT) TABS TAKE 1 TABLET BY MOUTH EVERY DAY 07/07/20  Yes [name], MD  donepezil (ARICEPT) 10 MG tablet Take 1 tablet daily 04/18/20  Yes Cameron Sprang, MD  ELIQUIS 5 MG TABS tablet TAKE 1 TABLET BY MOUTH TWICE A DAY 08/15/20  Yes [name], MD  fluticasone (FLONASE) 50 MCG/ACT nasal spray PLACE 1 SPRAY INTO BOTH NOSTRILS DAILY AS NEEDED FOR ALLERGIES OR  RHINITIS. 09/25/20  Yes [name], MD  furosemide (LASIX) 20 MG tablet TAKE 1 TABLET BY MOUTH EVERY DAY 08/21/20  Yes [name], MD  levothyroxine (SYNTHROID) 88 MCG tablet TAKE 1 TABLET BY MOUTH EVERY DAY IN THE MORNING ON AN EMPTY STOMACH 08/04/20  Yes [name], MD  lisinopril (ZESTRIL) 5 MG tablet Take 1 tablet (5 mg total) by mouth daily. 10/06/20  Yes [name], MD  oxybutynin (DITROPAN-XL) 5 MG 24 hr tablet TAKE 1 TABLET BY MOUTH EVERY DAY 06/05/20  Yes [name], MD  sertraline (ZOLOFT) 50 MG tablet Take 1 tablet (50 mg total) by mouth at bedtime. 05/08/20  Yes Cameron Sprang, MD    Physical Exam: Vitals:   10/15/20 1316 10/15/20 1330 10/15/20 1345 10/15/20 1445  BP:  129/77 132/76 (!) 148/91  Pulse:  70 69 76  Resp:  '11 16 19  '$ Temp:      TempSrc:      SpO2: 97% 91% 92% 100%  Weight:        Constitutional: NAD, calm, comfortable Vitals:   10/15/20 1316 10/15/20 1330 10/15/20 1345 10/15/20 1445  BP:  129/77 132/76 (!) 148/91  Pulse:  70 69 76  Resp:  '11 16 19  '$ Temp:      TempSrc:      SpO2: 97% 91% 92% 100%  Weight:       Eyes: PERRL, lids and conjunctivae normal ENMT: Mucous membranes are moist. Posterior pharynx clear of any exudate or lesions.Normal dentition.  Neck: normal, supple, no masses, no thyromegaly Respiratory: clear to auscultation bilaterally, no wheezing, no crackles. Normal respiratory effort. No accessory muscle use.  Cardiovascular: Irregular heartbeat, loud systolic murmur heart base. No extremity edema. 2+ pedal pulses. No carotid bruits.  Abdomen: no tenderness, no masses palpated. No hepatosplenomegaly. Bowel sounds positive.  Musculoskeletal: no clubbing / cyanosis. No joint deformity upper and lower extremities. Good ROM, no contractures. Normal muscle tone.  Skin: no rashes, lesions, ulcers. No induration Neurologic: CN 2-12 grossly intact. Sensation intact, DTR normal. Strength 5/5 in all 4.  Psychiatric: Normal judgment  and insight. Alert and oriented x 3. Normal mood.    Labs on Admission: I have personally reviewed following labs and imaging studies  CBC: Recent Labs  Lab 10/15/20 1229 10/15/20 1234  WBC 9.2  --   NEUTROABS 7.8*  --   HGB 10.6* 11.6*  HCT 33.9* 34.0*  MCV 85.0  --   PLT 222  --    Basic Metabolic Panel: Recent Labs  Lab 10/15/20 1229 10/15/20 1234  NA 129* 129*  K 3.9 3.9  CL 92* 92*  CO2 27  --   GLUCOSE 109* 107*  BUN 18 20  CREATININE 1.32* 1.30*  CALCIUM 8.9  --    GFR: Estimated Creatinine Clearance: 27.1 mL/min (A) (by C-G formula based on SCr of 1.3 mg/dL (H)). Liver Function Tests: Recent Labs  Lab 10/15/20 1229  AST 24  ALT 13  ALKPHOS 55  BILITOT 1.2  PROT 6.5  ALBUMIN 3.5   No results for input(s): LIPASE, AMYLASE in the last 168 hours. No results for input(s): AMMONIA in the last 168 hours. Coagulation Profile: Recent Labs  Lab 10/15/20 1229  INR 2.2*   Cardiac Enzymes: No results for input(s): CKTOTAL, CKMB, CKMBINDEX, TROPONINI in the last 168 hours. BNP (last 3 results) No results for input(s): PROBNP in the last 8760 hours. HbA1C: No results for input(s): HGBA1C in the last 72 hours. CBG: Recent Labs  Lab 10/15/20 1228  GLUCAP 112*   Lipid Profile: No results for input(s): CHOL, HDL, LDLCALC, TRIG, CHOLHDL, LDLDIRECT in the last 72 hours. Thyroid Function Tests: No results for input(s): TSH, T4TOTAL, FREET4, T3FREE, THYROIDAB in the last 72 hours. Anemia Panel: No results for input(s): VITAMINB12, FOLATE, FERRITIN, TIBC, IRON, RETICCTPCT in the last 72 hours. Urine analysis:    Component Value Date/Time   COLORURINE YELLOW 10/15/2020 [place] 10/15/2020 1227   LABSPEC 1.009 10/15/2020 1227   PHURINE 8.0 10/15/2020 1227   GLUCOSEU NEGATIVE 10/15/2020 1227   HGBUR SMALL (A) 10/15/2020 1227   BILIRUBINUR NEGATIVE 10/15/2020 1227   KETONESUR NEGATIVE 10/15/2020 1227   PROTEINUR NEGATIVE 10/15/2020 1227    UROBILINOGEN 2.0 (H) 05/09/2020 1338   NITRITE NEGATIVE 10/15/2020 1227   LEUKOCYTESUR MODERATE (A) 10/15/2020 1227  Radiological Exams on Admission: CT HEAD CODE STROKE WO CONTRAST  Result Date: 10/15/2020 CLINICAL DATA:  Neuro deficit, acute stroke suspected. EXAM: CT ANGIOGRAPHY HEAD AND NECK TECHNIQUE: Multidetector CT imaging of the head and neck was performed using the standard protocol during bolus administration of intravenous contrast. Multiplanar CT image reconstructions and MIPs were obtained to evaluate the vascular anatomy. Carotid stenosis measurements (when applicable) are obtained utilizing NASCET criteria, using the distal internal carotid diameter as the denominator. CONTRAST:  20m OMNIPAQUE IOHEXOL 350 MG/ML SOLN COMPARISON:  CT head from 09/26/2020. FINDINGS: CT HEAD FINDINGS Brain: No evidence of acute large vascular territory infarction, hemorrhage, hydrocephalus, extra-axial collection or mass lesion/mass effect. Similar patchy white matter hypoattenuation, nonspecific but most likely related to chronic microvascular ischemic disease that is moderate. Also, moderate atrophy with ex vacuo ventricular dilation, similar to prior. ASPECTS 10. Vascular: See below. Skull: No acute fracture. Sinuses: Visualized sinuses are clear. Orbits: No acute orbital findings. 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 Right carotid system: There is predominately calcific atherosclerosis at the carotid bifurcation without greater than 50% narrowing. Left carotid system: There is predominant calcific atherosclerosis at the carotid bifurcation without greater than 50% narrowing. Vertebral arteries: Mildly left dominant. No evidence of significant (greater than 50%) stenosis. Skeleton: No evidence of acute  abnormality.  Osteopenia. Other neck: No acute abnormality. Upper chest: Emphysema. No consolidation visualized lung apices. Scattered ground-glass opacities, likely related to expiratory imaging. Review of the MIP images confirms the above findings CTA HEAD FINDINGS Anterior circulation: The intracranial ICA is are patent bilaterally. There is bilateral calcific atherosclerosis without evidence of greater than 50% stenosis. Bilateral MCAs and ACAs are patent without evidence of proximal hemodynamically significant stenosis. Posterior circulation: Left dominant vertebral vertebral artery. The intradural vertebral arteries, basilar artery, and posterior cerebral arteries are patent. Severe right P2 PCA stenosis. Bilateral posterior communicating arteries. Venous sinuses: As permitted by contrast timing, patent. Review of the MIP images confirms the above findings IMPRESSION: CT head: 1. No evidence of acute large vascular territory infarct or acute hemorrhage. ASPECTS 10. 2. Moderate atrophy and chronic microvascular ischemic disease. CTA Head: 1. No large vessel occlusion. 2. Severe right P2 PCA stenosis. CTA Neck: 1. Bilateral carotid bifurcation atherosclerosis without greater than 50% narrowing. 2. 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 Recommend annual imaging followup by CTA or MRA. This recommendation follows 2010 ACCF/AHA/AATS/ACR/ASA/SCA/SCAI/SIR/STS/SVM Guidelines for the Diagnosis and Management of Patients with Thoracic Aortic Disease. Circulation.2010; 121:[id number] Aortic aneurysm NOS (ICD10-I71.9) 3. Dilation of the partially imaged main pulmonary artery up to 3.4 cm, which can be seen with pulmonary arterial hypertension. 4. Emphysema. Code stroke imaging results (inclduing major findings for CT head and CTA) were communicated on 10/15/2020 at 12:57 pm to provider JEvelena Peat(neurology NP) via telephone, who verbally acknowledged these results. Right PCA stenosis  finding paged to Dr. LCheral Markerat 1:24 PM via [facility]aging. Electronically Signed   By: FMargaretha SheffieldMD   On: 10/15/2020 13:28   CT ANGIO HEAD CODE STROKE  Result Date: 10/15/2020 CLINICAL DATA:  Neuro deficit, acute stroke suspected. EXAM: CT ANGIOGRAPHY HEAD AND NECK TECHNIQUE: Multidetector CT imaging of the head and neck was performed using the standard protocol during bolus administration of intravenous contrast. Multiplanar CT image  reconstructions and MIPs were obtained to evaluate the vascular anatomy. Carotid stenosis measurements (when applicable) are obtained utilizing NASCET criteria, using the distal internal carotid diameter as the denominator. CONTRAST:  49m OMNIPAQUE IOHEXOL 350 MG/ML SOLN COMPARISON:  CT head from 09/26/2020. FINDINGS: CT HEAD FINDINGS Brain: No evidence of acute large vascular territory infarction, hemorrhage, hydrocephalus, extra-axial collection or mass lesion/mass effect. Similar patchy white matter hypoattenuation, nonspecific but most likely related to chronic microvascular ischemic disease that is moderate. Also, moderate atrophy with ex vacuo ventricular dilation, similar to prior. ASPECTS 10. Vascular: See below. Skull: No acute fracture. Sinuses: Visualized sinuses are clear. Orbits: No acute orbital findings. 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 Right carotid system: There is predominately calcific atherosclerosis at the carotid bifurcation without greater than 50% narrowing. Left carotid system: There is predominant calcific atherosclerosis at the carotid bifurcation without greater than 50% narrowing. Vertebral arteries: Mildly left dominant. No evidence of significant (greater than 50%) stenosis. Skeleton: No evidence of acute abnormality.  Osteopenia. Other  neck: No acute abnormality. Upper chest: Emphysema. No consolidation visualized lung apices. Scattered ground-glass opacities, likely related to expiratory imaging. Review of the MIP images confirms the above findings CTA HEAD FINDINGS Anterior circulation: The intracranial ICA is are patent bilaterally. There is bilateral calcific atherosclerosis without evidence of greater than 50% stenosis. Bilateral MCAs and ACAs are patent without evidence of proximal hemodynamically significant stenosis. Posterior circulation: Left dominant vertebral vertebral artery. The intradural vertebral arteries, basilar artery, and posterior cerebral arteries are patent. Severe right P2 PCA stenosis. Bilateral posterior communicating arteries. Venous sinuses: As permitted by contrast timing, patent. Review of the MIP images confirms the above findings IMPRESSION: CT head: 1. No evidence of acute large vascular territory infarct or acute hemorrhage. ASPECTS 10. 2. Moderate atrophy and chronic microvascular ischemic disease. CTA Head: 1. No large vessel occlusion. 2. Severe right P2 PCA stenosis. CTA Neck: 1. Bilateral carotid bifurcation atherosclerosis without greater than 50% narrowing. 2. 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 Recommend annual imaging followup by CTA or MRA. This recommendation follows 2010 ACCF/AHA/AATS/ACR/ASA/SCA/SCAI/SIR/STS/SVM Guidelines for the Diagnosis and Management of Patients with Thoracic Aortic Disease. Circulation.2010; 121:[id number] Aortic aneurysm NOS (ICD10-I71.9) 3. Dilation of the partially imaged main pulmonary artery up to 3.4 cm, which can be seen with pulmonary arterial hypertension. 4. Emphysema. Code stroke imaging results (inclduing major findings for CT head and CTA) were communicated on 10/15/2020 at 12:57 pm to provider JEvelena Peat(neurology NP) via telephone, who verbally acknowledged these results. Right PCA stenosis finding paged to Dr. LCheral Markerat  1:24 PM via [facility]paging. Electronically Signed   By: FMargaretha SheffieldMD   On: 10/15/2020 13:28   CT ANGIO NECK CODE STROKE  Result Date: 10/15/2020 CLINICAL DATA:  Neuro deficit, acute stroke suspected. EXAM: CT ANGIOGRAPHY HEAD AND NECK TECHNIQUE: Multidetector CT imaging of the head and neck was performed using the standard protocol during bolus administration of intravenous contrast. Multiplanar CT image reconstructions and MIPs were obtained to evaluate the vascular anatomy. Carotid stenosis measurements (when applicable) are obtained utilizing NASCET criteria, using the distal internal carotid diameter as the denominator. CONTRAST:  760mOMNIPAQUE IOHEXOL 350 MG/ML SOLN COMPARISON:  CT head from 09/26/2020. FINDINGS: CT HEAD FINDINGS Brain: No evidence of acute  large vascular territory infarction, hemorrhage, hydrocephalus, extra-axial collection or mass lesion/mass effect. Similar patchy white matter hypoattenuation, nonspecific but most likely related to chronic microvascular ischemic disease that is moderate. Also, moderate atrophy with ex vacuo ventricular dilation, similar to prior. ASPECTS 10. Vascular: See below. Skull: No acute fracture. Sinuses: Visualized sinuses are clear. Orbits: No acute orbital findings. 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 Right carotid system: There is predominately calcific atherosclerosis at the carotid bifurcation without greater than 50% narrowing. Left carotid system: There is predominant calcific atherosclerosis at the carotid bifurcation without greater than 50% narrowing. Vertebral arteries: Mildly left dominant. No evidence of significant (greater than 50%) stenosis. Skeleton: No evidence of acute abnormality.  Osteopenia. Other neck: No acute abnormality. Upper  chest: Emphysema. No consolidation visualized lung apices. Scattered ground-glass opacities, likely related to expiratory imaging. Review of the MIP images confirms the above findings CTA HEAD FINDINGS Anterior circulation: The intracranial ICA is are patent bilaterally. There is bilateral calcific atherosclerosis without evidence of greater than 50% stenosis. Bilateral MCAs and ACAs are patent without evidence of proximal hemodynamically significant stenosis. Posterior circulation: Left dominant vertebral vertebral artery. The intradural vertebral arteries, basilar artery, and posterior cerebral arteries are patent. Severe right P2 PCA stenosis. Bilateral posterior communicating arteries. Venous sinuses: As permitted by contrast timing, patent. Review of the MIP images confirms the above findings IMPRESSION: CT head: 1. No evidence of acute large vascular territory infarct or acute hemorrhage. ASPECTS 10. 2. Moderate atrophy and chronic microvascular ischemic disease. CTA Head: 1. No large vessel occlusion. 2. Severe right P2 PCA stenosis. CTA Neck: 1. Bilateral carotid bifurcation atherosclerosis without greater than 50% narrowing. 2. 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 Recommend annual imaging followup by CTA or MRA. This recommendation follows 2010 ACCF/AHA/AATS/ACR/ASA/SCA/SCAI/SIR/STS/SVM Guidelines for the Diagnosis and Management of Patients with Thoracic Aortic Disease. Circulation.2010; [id number]. Aortic aneurysm NOS (ICD10-I71.9) 3. Dilation of the partially imaged main pulmonary artery up to 3.4 cm, which can be seen with pulmonary arterial hypertension. 4. Emphysema. Code stroke imaging results (inclduing major findings for CT head and CTA) were communicated on 10/15/2020 at 12:57 pm to provider Evelena Peat (neurology NP) via telephone, who verbally acknowledged these results. Right PCA stenosis finding paged to Dr. Cheral Marker at 1:24 PM via [facility] paging.  Electronically Signed   By: Margaretha Sheffield MD   On: 10/15/2020 13:28    EKG: Independently reviewed. Paced   Assessment/Plan Active Problems:   TIA (transient ischemic attack)  (please populate well all problems here in Problem List. (For example, if patient is on BP meds at home and you resume or decide to hold them, it is a problem that needs to be her. Same for CAD, COPD, HLD and so on)  TIA -With transient left-sided weakness and expressive aphasia.  Differential can be TIA secondary to a potential vascular issue, thrombo/embolic event considered to be less possible given the patient on systemic anticoagulant.  Other etiologies such as severe aortic stenosis related to be considered given the patient has other symptoms and signs of aortic stenosis decompensation such as worsening of dyspnea and peripheral edema.  Especially since patient had a rather low blood pressure reading this morning and then a rebound hypertension after onset of TIA. -Monitor blood pressure  closely, hold off BP medication including ACE inhibitor and Lasix today. -MRI ordered. -Repeat echocardiogram. -Continue Eliquis and statin.  Severe aortic stenosis -Long-term prognosis poor given the onset of CHF like symptoms, discussed with patient daughter at bedside, plan for TAVR when stable. -Hold off Lasix today.  CKD stage III -Euvolemic, hold off Lasix today. -Creatinine level stable, received IV contrast today, will give gentle hydration normal saline 75 ml/HR x 12 hours for nephro protection.  Hyponatremia -Chronic, likely related to severe aortic stenosis and CKD. -Restart Lasix tomorrow.  Chronic A. Fib -Continue Eliquis  Hypothyroidism -Continue Synthroid.  Chronic normocytic anemia -H&H appears to be stable, no S/S of active bleeding, check iron study and reticulocyte count.   DVT prophylaxis: Eliquis Code Status: Full Code Family Communication: Daughter at bedside Disposition Plan: Expect  less than 2 midnight hospital stay Consults called: Neurology Admission status: Telemetry observation   Lequita Halt MD [facility] Pager [phone]  10/15/2020, 2:59 PM

## 2020-10-15 NOTE — ED Notes (Signed)
I had messaged dr. Roosevelt Locks about pt being NPO and having PO meds ordered. See new orders.

## 2020-10-15 NOTE — ED Notes (Signed)
Pt to MRI via stretcher.

## 2020-10-15 NOTE — ED Provider Notes (Signed)
[facility] EMERGENCY DEPARTMENT Provider Note   CSN: [id number] Arrival date & time: 10/15/20  1226  An emergency department physician performed an initial assessment on this suspected stroke patient at 60.  History Chief Complaint  Patient presents with   Code Stroke    [name] is a 87 y.o. female brought in by EMS from Dr. Laurene Footman office at [facility] cardiology as a code stroke.  History is gathered from the patient's daughter who is at bedside.  She reports that they were having discussion about aortic valve replacement when she had sudden onset of garbled speech, left-sided facial droop, difficulty swallowing and leaning to the left side.  She is a previous history of TIA.  She is on chronic anticoagulation with Eliquis and is not a tPA candidate.  She has a history of dementia.  Her daughter also reports that she has recently been on higher dose diuretic with multiple episodes of hypotension and had a decrease in her both her lisinopril and her diuretic due to hypotension.  She is currently normotensive.  She has not had any return to her baseline and continues to have some difficulty with her speech.  She was seen upon arrival by the stroke team with negative CT imaging.  HPI     Past Medical History:  Diagnosis Date   Chronic anticoagulation    Chronic atrial fibrillation ([place]) 01/28/2017   Chronic venous insufficiency 01/28/2017   Clavicle fracture 11/20/2017   Long term current use of anticoagulant therapy 01/28/2017   Osteoarthritis    Presence of permanent cardiac pacemaker 01/28/2017   Original implant 1992 [employer] 431-04 Generator change 2001 and 2011.  Medtronic generator.    Severe aortic stenosis    SSS (sick sinus syndrome) (HCC)    Thyroid disease    hypothyroidism   TIA (transient ischemic attack)     Patient Active Problem List   Diagnosis Date Noted   Abrasion of right elbow 10/06/2020   Osteoporosis 09/29/2020    Moderate dementia with behavioral disturbance (HCC) 09/04/2020   Severe aortic stenosis    Aortic stenosis 07/15/2020   CHF (congestive heart failure) ([place]) 06/05/2020   Left sided abdominal pain 05/13/2020   Sinus congestion 12/18/2019   Dementia without behavioral disturbance ([place]) 07/31/2019   Sick sinus syndrome ([place]) 07/28/2019   Epigastric pain 06/12/2019   Localized swelling of left lower leg 04/11/2019   Memory loss 04/11/2019   Prediabetes 03/14/2019   Complete heart block ([place]) 03/05/2019   TIA (transient ischemic attack) 01/15/2019   Gait abnormality 01/15/2019   Transient ischemia 12/13/2018   Dysarthria 12/11/2018   Hypothyroidism 12/11/2018   Arthritis of right hand 08/17/2018   Chronic back pain 04/11/2018   Encounter for therapeutic drug monitoring 03/09/2018   Hypertension 01/04/2018   Clavicle fracture 11/20/2017   Closed fracture of clavicle 11/20/2017   Fall    Accelerated hypertension    Carpal tunnel syndrome of left wrist 08/02/2017   Carpal tunnel syndrome of right wrist 08/02/2017   Bradycardia 02/02/2017   Presence of permanent cardiac pacemaker 01/28/2017   Chronic a-fib ([place]) 01/28/2017   Long term current use of anticoagulant therapy 01/28/2017   Chronic venous insufficiency 01/28/2017   Chronic atrial fibrillation ([place]) 01/28/2017   Peripheral venous insufficiency 01/28/2017   Cardiac pacemaker in situ 01/28/2017   Anticoagulated 01/28/2017   DOE (dyspnea on exertion) 09/22/2015   Dyspnea on exertion 09/22/2015   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 05/15/2014  After cataract of left eye not obscuring vision 05/15/2014   Bilateral dry eyes 05/15/2014   Dermatochalasis of eyelid 05/15/2014   Hyperopia of both eyes with astigmatism and presbyopia 05/15/2014   Irregular astigmatism of right eye 05/15/2014   Mechanical complication due to intraocular lens implant 05/15/2014   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   Vitreous syneresis of both eyes  05/15/2014    Past Surgical History:  Procedure Laterality Date   ABDOMINAL HYSTERECTOMY     INSERT / REPLACE / REMOVE PACEMAKER     generator change 2011 medtronic sigma   KNEE SURGERY     LEAD INSERTION N/A 03/05/2019   Procedure: LEAD INSERTION - RV LEAD;  Surgeon: Deboraha Sprang, MD;  Location: [facility];  Service: Cardiovascular;  Laterality: N/A;   PACEMAKER IMPLANT N/A 03/05/2019   Procedure: PACEMAKER IMPLANT;  Surgeon: Deboraha Sprang, MD;  Location: [facility];  Service: Cardiovascular;  Laterality: N/A;   PACEMAKER INSERTION     PARS PLANA VITRECTOMY Right 07/19/2018   Procedure: VITRECTOMY WITH VITREOUS BIPOSY & ENDOLASER;  Surgeon: Jalene Mullet, MD;  Location: [facility];  Service: Ophthalmology;  Laterality: Right;   PPM GENERATOR CHANGEOUT N/A 02/02/2017   Procedure: PPM GENERATOR CHANGEOUT;  Surgeon: Deboraha Sprang, MD;  Location: [facility];  Service: Cardiovascular;  Laterality: N/A;   REPLACEMENT TOTAL KNEE BILATERAL     RIGHT/LEFT HEART CATH AND CORONARY ANGIOGRAPHY N/A 07/31/2020   Procedure: RIGHT/LEFT HEART CATH AND CORONARY ANGIOGRAPHY;  Surgeon: Burnell Blanks, MD;  Location: [facility];  Service: Cardiovascular;  Laterality: N/A;   VARICOSE VEIN SURGERY       OB History     Gravida  3   Para  3   Term      Preterm      AB      Living  2      SAB      IAB      Ectopic      Multiple      Live Births              Family History  Problem Relation Age of Onset   Diabetes Mother    Mental illness Mother    Diabetes Father    Heart disease Father     Social History   Tobacco Use   Smoking status: Former    Packs/day: 0.25    Years: 10.00    Pack years: 2.50    Types: Cigarettes    Quit date: 1980    Years since quitting: 42.5   Smokeless tobacco: Never  Vaping Use   Vaping Use: Never used  Substance Use Topics   Alcohol use: No   Drug use: No    Home Medications Prior to  Admission medications   Medication Sig Start Date End Date Taking? Authorizing Provider  acetaminophen (TYLENOL) 500 MG tablet Take 1,000 mg by mouth every 8 (eight) hours as needed for moderate pain or headache.     [provider]  atorvastatin (LIPITOR) 20 MG tablet TAKE 1 TABLET BY MOUTH EVERY DAY IN THE EVENING 06/30/20   [name], MD  Carboxymethylcell-Hypromellose (GENTEAL OP) Place 1 drop into both eyes daily as needed (dry eyes).    [provider]  cetirizine (ZYRTEC) 5 MG tablet TAKE 1 TABLET BY MOUTH EVERY DAY 04/18/20   [name], MD  D 2000 50 MCG (2000 UT) TABS TAKE 1 TABLET BY  MOUTH EVERY DAY 07/07/20   [name], MD  donepezil (ARICEPT) 10 MG tablet Take 1 tablet daily 04/18/20   Cameron Sprang, MD  ELIQUIS 5 MG TABS tablet TAKE 1 TABLET BY MOUTH TWICE A DAY 08/15/20   [name], MD  fluticasone (FLONASE) 50 MCG/ACT nasal spray PLACE 1 SPRAY INTO BOTH NOSTRILS DAILY AS NEEDED FOR ALLERGIES OR RHINITIS. 09/25/20   [name], MD  furosemide (LASIX) 20 MG tablet TAKE 1 TABLET BY MOUTH EVERY DAY 08/21/20   [name], MD  levothyroxine (SYNTHROID) 88 MCG tablet TAKE 1 TABLET BY MOUTH EVERY DAY IN THE MORNING ON AN EMPTY STOMACH 08/04/20   [name], MD  lisinopril (ZESTRIL) 5 MG tablet Take 1 tablet (5 mg total) by mouth daily. 10/06/20   [name], MD  oxybutynin (DITROPAN-XL) 5 MG 24 hr tablet TAKE 1 TABLET BY MOUTH EVERY DAY 06/05/20   [name], MD  sertraline (ZOLOFT) 50 MG tablet Take 1 tablet (50 mg total) by mouth at bedtime. 05/08/20   Cameron Sprang, MD    Allergies    Adhesive [tape] and Penicillins  Review of Systems   Review of Systems Unable to review systems due to altered mental status, dementia and aphasia. Physical Exam Updated Vital Signs BP 129/77   Pulse 70   Resp 11   Wt 65.8 kg   SpO2 91%   BMI 26.53 kg/m   Physical Exam Vitals and nursing note reviewed.  Constitutional:      General:  She is not in acute distress.    Appearance: She is well-developed. She is not diaphoretic.  HENT:     Head: Normocephalic and atraumatic.     Right Ear: External ear normal.     Left Ear: External ear normal.     Nose: Nose normal.     Mouth/Throat:     Mouth: Mucous membranes are moist.  Eyes:     General: No scleral icterus.    Conjunctiva/sclera: Conjunctivae normal.  Cardiovascular:     Rate and Rhythm: Normal rate and regular rhythm.     Heart sounds: Normal heart sounds. No murmur heard.   No friction rub. No gallop.  Pulmonary:     Effort: Pulmonary effort is normal. No respiratory distress.     Breath sounds: Normal breath sounds.  Abdominal:     General: Bowel sounds are normal. There is no distension.     Palpations: Abdomen is soft. There is no mass.     Tenderness: There is no abdominal tenderness. There is no guarding.  Musculoskeletal:     Cervical back: Normal range of motion.  Skin:    General: Skin is warm and dry.  Neurological:     Mental Status: She is alert.     GCS: GCS eye subscore is 4. GCS verbal subscore is 2. GCS motor subscore is 6.     Cranial Nerves: Facial asymmetry present.     Motor: Motor function is intact.     Deep Tendon Reflexes: Reflexes are normal and symmetric.     Comments: Patient with left-sided facial flattening. Speech is intermittently comprehensible and otherwise garbled and incomprehensible. Equal grip strength and plantar and dorsi flexion in the lower extremities bilaterally.   Psychiatric:        Behavior: Behavior normal.    ED Results / Procedures / Treatments   Labs (all labs ordered are listed, but only abnormal results are displayed) Labs  Reviewed  PROTIME-INR - Abnormal; Notable for the following components:      Result Value   Prothrombin Time 24.2 (*)    INR 2.2 (*)    All other components within normal limits  APTT - Abnormal; Notable for the following components:   aPTT 41 (*)    All other components  within normal limits  CBC - Abnormal; Notable for the following components:   Hemoglobin 10.6 (*)    HCT 33.9 (*)    RDW 19.7 (*)    All other components within normal limits  DIFFERENTIAL - Abnormal; Notable for the following components:   Neutro Abs 7.8 (*)    Lymphs Abs 0.5 (*)    All other components within normal limits  COMPREHENSIVE METABOLIC PANEL - Abnormal; Notable for the following components:   Sodium 129 (*)    Chloride 92 (*)    Glucose, Bld 109 (*)    Creatinine, Ser 1.32 (*)    GFR, Estimated 39 (*)    All other components within normal limits  I-STAT CHEM 8, ED - Abnormal; Notable for the following components:   Sodium 129 (*)    Chloride 92 (*)    Creatinine, Ser 1.30 (*)    Glucose, Bld 107 (*)    Calcium, Ion 1.05 (*)    Hemoglobin 11.6 (*)    HCT 34.0 (*)    All other components within normal limits  CBG MONITORING, ED - Abnormal; Notable for the following components:   Glucose-Capillary 112 (*)    All other components within normal limits  RESP PANEL BY RT-PCR (FLU A&B, COVID) ARPGX2  ETHANOL  RAPID URINE DRUG SCREEN, HOSP PERFORMED  URINALYSIS, ROUTINE W REFLEX MICROSCOPIC    EKG None  Radiology CT HEAD CODE STROKE WO CONTRAST  Result Date: 10/15/2020 CLINICAL DATA:  Neuro deficit, acute stroke suspected. EXAM: CT ANGIOGRAPHY HEAD AND NECK TECHNIQUE: Multidetector CT imaging of the head and neck was performed using the standard protocol during bolus administration of intravenous contrast. Multiplanar CT image reconstructions and MIPs were obtained to evaluate the vascular anatomy. Carotid stenosis measurements (when applicable) are obtained utilizing NASCET criteria, using the distal internal carotid diameter as the denominator. CONTRAST:  26m OMNIPAQUE IOHEXOL 350 MG/ML SOLN COMPARISON:  CT head from 09/26/2020. FINDINGS: CT HEAD FINDINGS Brain: No evidence of acute large vascular territory infarction, hemorrhage, hydrocephalus, extra-axial collection  or mass lesion/mass effect. Similar patchy white matter hypoattenuation, nonspecific but most likely related to chronic microvascular ischemic disease that is moderate. Also, moderate atrophy with ex vacuo ventricular dilation, similar to prior. ASPECTS 10. Vascular: See below. Skull: No acute fracture. Sinuses: Visualized sinuses are clear. Orbits: No acute orbital findings. 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 Right carotid system: There is predominately calcific atherosclerosis at the carotid bifurcation without greater than 50% narrowing. Left carotid system: There is predominant calcific atherosclerosis at the carotid bifurcation without greater than 50% narrowing. Vertebral arteries: Mildly left dominant. No evidence of significant (greater than 50%) stenosis. Skeleton: No evidence of acute abnormality.  Osteopenia. Other neck: No acute abnormality. Upper chest: Emphysema. No consolidation visualized lung apices. Scattered ground-glass opacities, likely related to expiratory imaging. Review of the MIP images confirms the above findings CTA HEAD FINDINGS Anterior circulation: The intracranial ICA is are patent bilaterally. There  is bilateral calcific atherosclerosis without evidence of greater than 50% stenosis. Bilateral MCAs and ACAs are patent without evidence of proximal hemodynamically significant stenosis. Posterior circulation: Left dominant vertebral vertebral artery. The intradural vertebral arteries, basilar artery, and posterior cerebral arteries are patent. Severe right P2 PCA stenosis. Bilateral posterior communicating arteries. Venous sinuses: As permitted by contrast timing, patent. Review of the MIP images confirms the above findings IMPRESSION: CT head: 1. No evidence of acute large vascular  territory infarct or acute hemorrhage. ASPECTS 10. 2. Moderate atrophy and chronic microvascular ischemic disease. CTA Head: 1. No large vessel occlusion. 2. Severe right P2 PCA stenosis. CTA Neck: 1. Bilateral carotid bifurcation atherosclerosis without greater than 50% narrowing. 2. 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 Recommend annual imaging followup by CTA or MRA. This recommendation follows 2010 ACCF/AHA/AATS/ACR/ASA/SCA/SCAI/SIR/STS/SVM Guidelines for the Diagnosis and Management of Patients with Thoracic Aortic Disease. Circulation.2010; [id number]. Aortic aneurysm NOS (ICD10-I71.9) 3. Dilation of the partially imaged main pulmonary artery up to 3.4 cm, which can be seen with pulmonary arterial hypertension. 4. Emphysema. Code stroke imaging results (inclduing major findings for CT head and CTA) were communicated on 10/15/2020 at 12:57 pm to provider Evelena Peat (neurology NP) via telephone, who verbally acknowledged these results. Right PCA stenosis finding paged to Dr. Cheral Marker at 1:24 PM via [facility] paging. Electronically Signed   By: Margaretha Sheffield MD   On: 10/15/2020 13:28   CT ANGIO HEAD CODE STROKE  Result Date: 10/15/2020 CLINICAL DATA:  Neuro deficit, acute stroke suspected. EXAM: CT ANGIOGRAPHY HEAD AND NECK TECHNIQUE: Multidetector CT imaging of the head and neck was performed using the standard protocol during bolus administration of intravenous contrast. Multiplanar CT image reconstructions and MIPs were obtained to evaluate the vascular anatomy. Carotid stenosis measurements (when applicable) are obtained utilizing NASCET criteria, using the distal internal carotid diameter as the denominator. CONTRAST:  28m OMNIPAQUE IOHEXOL 350 MG/ML SOLN COMPARISON:  CT head from 09/26/2020. FINDINGS: CT HEAD FINDINGS Brain: No evidence of acute large vascular territory infarction, hemorrhage, hydrocephalus, extra-axial collection or mass lesion/mass effect.  Similar patchy white matter hypoattenuation, nonspecific but most likely related to chronic microvascular ischemic disease that is moderate. Also, moderate atrophy with ex vacuo ventricular dilation, similar to prior. ASPECTS 10. Vascular: See below. Skull: No acute fracture. Sinuses: Visualized sinuses are clear. Orbits: No acute orbital findings. 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 Right carotid system: There is predominately calcific atherosclerosis at the carotid bifurcation without greater than 50% narrowing. Left carotid system: There is predominant calcific atherosclerosis at the carotid bifurcation without greater than 50% narrowing. Vertebral arteries: Mildly left dominant. No evidence of significant (greater than 50%) stenosis. Skeleton: No evidence of acute abnormality.  Osteopenia. Other neck: No acute abnormality. Upper chest: Emphysema. No consolidation visualized lung apices. Scattered ground-glass opacities, likely related to expiratory imaging. Review of the MIP images confirms the above findings CTA HEAD FINDINGS Anterior circulation: The intracranial ICA is are patent bilaterally. There is bilateral calcific atherosclerosis without evidence of greater than 50% stenosis. Bilateral MCAs and ACAs are patent without evidence of proximal hemodynamically significant stenosis. Posterior circulation: Left dominant vertebral vertebral artery. The intradural vertebral arteries, basilar artery, and posterior cerebral arteries are patent. Severe right P2 PCA stenosis. Bilateral posterior communicating arteries.  Venous sinuses: As permitted by contrast timing, patent. Review of the MIP images confirms the above findings IMPRESSION: CT head: 1. No evidence of acute large vascular territory infarct or acute  hemorrhage. ASPECTS 10. 2. Moderate atrophy and chronic microvascular ischemic disease. CTA Head: 1. No large vessel occlusion. 2. Severe right P2 PCA stenosis. CTA Neck: 1. Bilateral carotid bifurcation atherosclerosis without greater than 50% narrowing. 2. 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 Recommend annual imaging followup by CTA or MRA. This recommendation follows 2010 ACCF/AHA/AATS/ACR/ASA/SCA/SCAI/SIR/STS/SVM Guidelines for the Diagnosis and Management of Patients with Thoracic Aortic Disease. Circulation.2010; [id number]. Aortic aneurysm NOS (ICD10-I71.9) 3. Dilation of the partially imaged main pulmonary artery up to 3.4 cm, which can be seen with pulmonary arterial hypertension. 4. Emphysema. Code stroke imaging results (inclduing major findings for CT head and CTA) were communicated on 10/15/2020 at 12:57 pm to provider Evelena Peat (neurology NP) via telephone, who verbally acknowledged these results. Right PCA stenosis finding paged to Dr. Cheral Marker at 1:24 PM via [facility] paging. Electronically Signed   By: Margaretha Sheffield MD   On: 10/15/2020 13:28   CT ANGIO NECK CODE STROKE  Result Date: 10/15/2020 CLINICAL DATA:  Neuro deficit, acute stroke suspected. EXAM: CT ANGIOGRAPHY HEAD AND NECK TECHNIQUE: Multidetector CT imaging of the head and neck was performed using the standard protocol during bolus administration of intravenous contrast. Multiplanar CT image reconstructions and MIPs were obtained to evaluate the vascular anatomy. Carotid stenosis measurements (when applicable) are obtained utilizing NASCET criteria, using the distal internal carotid diameter as the denominator. CONTRAST:  56m OMNIPAQUE IOHEXOL 350 MG/ML SOLN COMPARISON:  CT head from 09/26/2020. FINDINGS: CT HEAD FINDINGS Brain: No evidence of acute large vascular territory infarction, hemorrhage, hydrocephalus, extra-axial collection or mass lesion/mass effect. Similar patchy white matter  hypoattenuation, nonspecific but most likely related to chronic microvascular ischemic disease that is moderate. Also, moderate atrophy with ex vacuo ventricular dilation, similar to prior. ASPECTS 10. Vascular: See below. Skull: No acute fracture. Sinuses: Visualized sinuses are clear. Orbits: No acute orbital findings. 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 Right carotid system: There is predominately calcific atherosclerosis at the carotid bifurcation without greater than 50% narrowing. Left carotid system: There is predominant calcific atherosclerosis at the carotid bifurcation without greater than 50% narrowing. Vertebral arteries: Mildly left dominant. No evidence of significant (greater than 50%) stenosis. Skeleton: No evidence of acute abnormality.  Osteopenia. Other neck: No acute abnormality. Upper chest: Emphysema. No consolidation visualized lung apices. Scattered ground-glass opacities, likely related to expiratory imaging. Review of the MIP images confirms the above findings CTA HEAD FINDINGS Anterior circulation: The intracranial ICA is are patent bilaterally. There is bilateral calcific atherosclerosis without evidence of greater than 50% stenosis. Bilateral MCAs and ACAs are patent without evidence of proximal hemodynamically significant stenosis. Posterior circulation: Left dominant vertebral vertebral artery. The intradural vertebral arteries, basilar artery, and posterior cerebral arteries are patent. Severe right P2 PCA stenosis. Bilateral posterior communicating arteries. Venous sinuses: As permitted by contrast timing, patent. Review of the MIP images confirms the above findings IMPRESSION: CT head: 1. No evidence of acute large vascular territory infarct or acute hemorrhage. ASPECTS 10. 2.  Moderate atrophy and chronic microvascular ischemic disease. CTA Head: 1. No large vessel occlusion. 2. Severe  right P2 PCA stenosis. CTA Neck: 1. Bilateral carotid bifurcation atherosclerosis without greater than 50% narrowing. 2. 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 Recommend annual imaging followup by CTA or MRA. This recommendation follows 2010 ACCF/AHA/AATS/ACR/ASA/SCA/SCAI/SIR/STS/SVM Guidelines for the Diagnosis and Management of Patients with Thoracic Aortic Disease. Circulation.2010; [id number]. Aortic aneurysm NOS (ICD10-I71.9) 3. Dilation of the partially imaged main pulmonary artery up to 3.4 cm, which can be seen with pulmonary arterial hypertension. 4. Emphysema. Code stroke imaging results (inclduing major findings for CT head and CTA) were communicated on 10/15/2020 at 12:57 pm to provider Evelena Peat (neurology NP) via telephone, who verbally acknowledged these results. Right PCA stenosis finding paged to Dr. Cheral Marker at 1:24 PM via [facility] paging. Electronically Signed   By: Margaretha Sheffield MD   On: 10/15/2020 13:28    Procedures Procedures   Medications Ordered in ED Medications  iohexol (OMNIPAQUE) 350 MG/ML injection 75 mL (75 mLs Intravenous Contrast Given 10/15/20 1304)    ED Course  I have reviewed the triage vital signs and the nursing notes.  Pertinent labs & imaging results that were available during my care of the patient were reviewed by me and considered in my medical decision making (see chart for details).    MDM Rules/Calculators/A&P                           88 year old female here with acute neurologic changes.  Seen as a code stroke.  CT imaging is negative for acute stroke however findings are very concerning for this and she will need admission for a stroke work-up. The differential diagnosis for AMS is extensive and includes, but is not limited to: drug overdose - opioids, alcohol, sedatives, antipsychotics, drug  withdrawal, others; Metabolic: hypoxia, hypoglycemia, hyperglycemia, hypercalcemia, hypernatremia, hyponatremia, uremia, hepatic encephalopathy, hypothyroidism, hyperthyroidism, vitamin B12 or thiamine deficiency, carbon monoxide poisoning, Wilson's disease, Lactic acidosis, ; Infectious: meningitis, encephalitis, bacteremia/sepsis, urinary tract infection, pneumonia, neurosyphilis; Structural: Space-occupying lesion, (brain tumor, subdural hematoma, hydrocephalus,); Vascular: stroke, subarachnoid hemorrhage, coronary ischemia, hypertensive encephalopathy, CNS vasculitis, thrombotic thrombocytopenic purpura, disseminated intravascular coagulation, hyperviscosity; Psychiatric: Schizophrenia, depression; Other: Seizure, hypothermia, heat stroke, ICU psychosis, dementia -"sundowning." Standard stroke work-up labs ordered and reviewed by me.  Patient has mildly low sodium in setting of slightly elevated blood glucose.  Creatinine 1.32 consistent with chronic renal insufficiency. Patient's APTT PT/INR mildly elevated.  Patient's CBC shows mild normocytic Citic anemia.  I personally reviewed and interpreted CT head angiogram of the head and neck which show multiple age-related abnormalities and arterial disease however no obvious stroke findings on these images Patient will be admitted to the hospital service for stroke work-up.  Final Clinical Impression(s) / ED Diagnoses Final diagnoses:  Slurred speech  Left-sided weakness  Aphasia    Rx / DC Orders ED Discharge Orders     None        Margarita Mail, PA-C 10/16/20 1640    Charlesetta Shanks, MD 10/17/20 1530

## 2020-10-15 NOTE — Code Documentation (Signed)
Stroke Response Nurse Documentation Code Documentation  [name] is a 87 y.o. female arriving to [facility] ED via [facility] on 10/15/20 with past medical hx significant of atrial fibrillation, pacemaker, TIA, hypothyroidism, and aortic stenosis. Recent fall 7/8. On Eliquis (apixaban) daily.   Code stroke was activated by EMS. Patient from cardiologist's office where she was being seen regarding mitral valve repair. During the visit she was noted to have left facial droop and slurred speech. LKW 1145.   Stroke team at the bedside on patient arrival. Labs drawn and patient cleared for CT by Dr. Almyra Free. Patient to CT with team. NIHSS 1, see documentation for details and code stroke times. Patient with disoriented on exam. The following imaging was completed: CT, CTA head and neck. Patient is not a candidate for tPA due to anticoagulation. Care/Plan Q2 assessments. Bedside handoff with ED RN.    Leverne Humbles Stroke Response RN

## 2020-10-15 NOTE — Progress Notes (Unsigned)
Pt had a pacemaker revision and abandoned leads from previous implant. Due to abandoned leads, we are unable to proceed with any MRIs.

## 2020-10-15 NOTE — Progress Notes (Signed)
Patient ID: [name], female   DOB: 1930-04-26, 87 y.o.   MRN: [id number]  [facility]         [address]       [place] [address]             [phone]          CARDIOTHORACIC SURGERY CONSULTATION REPORT  PCP is [name], MD Referring Provider is Lauree Chandler Primary Cardiologist is Virl Axe, MD  Reason for consultation: Severe aortic stenosis  HPI:  The patient is an 87 year old woman with a history of persistent atrial fibrillation, complete heart block status post permanent pacemaker, hypothyroidism, prior TIA, mild dementia, and severe aortic stenosis referred by Dr. Angelena Form for consideration of TAVR.  The patient has been followed by Dr. Caryl Comes with persistent atrial fibrillation and has been on Eliquis.  Echocardiogram and August 2021 had shown moderate aortic stenosis with a mean gradient of 29 mmHg as well as moderate regurgitation with a pressure half-time of 328 ms.  There was moderate mitral and tricuspid regurgitation.  Right ventricular systolic function was mildly reduced with mild enlargement of the right ventricle and pulmonary hypertension with an estimated PA systolic pressure of 55 mmHg.  Left ventricular ejection fraction was 60 to 65%.  She presents with a 73-monthhistory of progressive exertional dyspnea and fatigue.  [place].  [place] is [place]  The patient has reduced mobility due to recurrent falls recently.  She denies any dizziness but said that she tends to trip over her feet and lose her balance.  She uses a walker to get around her house.  She gets short of breath walking to the bathroom.  She denies any chest pain or pressure.  She denies orthopnea and PND.  She has had some peripheral edema at times.  She has been symptomatically better on Lasix but has  developed some orthostatic hypotension.  Her most recent echo on 07/11/2020 showed a mean aortic valve gradient of 20 mmHg suggesting moderate aortic stenosis.  Aortic valve was trileaflet with heavy calcification and restricted mobility of the leaflets.  Aortic insufficiency was noted to be trivial.  The right ventricle remained mildly enlarged with mild RV systolic dysfunction and moderately elevated pulmonary artery pressure estimated at 47 mmHg.  Left ventricular ejection fraction was 60 to 65%.  Volume index was low at 27.  Dimensionless index was measured at 0.25.  Aortic valve area was measured at 0.7 cm by VTI.   Past Medical History:  Diagnosis Date   Chronic anticoagulation    Chronic atrial fibrillation ([facility] 01/28/2017   Chronic venous insufficiency 01/28/2017   Clavicle fracture 11/20/2017   Long term current use of anticoagulant therapy 01/28/2017   Osteoarthritis    Presence of permanent cardiac pacemaker 01/28/2017   Original implant 1992 Lead [employer] 431-04 Generator change 2001 and 2011.  Medtronic generator.    Severe aortic stenosis    SSS (sick sinus syndrome) (HCC)    Thyroid disease    hypothyroidism   TIA (transient ischemic attack)     Past Surgical History:  Procedure Laterality Date   ABDOMINAL HYSTERECTOMY     INSERT / REPLACE / REMOVE PACEMAKER     generator change 2011 medtronic sigma   KNEE SURGERY     LEAD INSERTION N/A 03/05/2019  Procedure: LEAD INSERTION - RV LEAD;  Surgeon: Deboraha Sprang, MD;  Location: [facility];  Service: Cardiovascular;  Laterality: N/A;   PACEMAKER IMPLANT N/A 03/05/2019   Procedure: PACEMAKER IMPLANT;  Surgeon: Deboraha Sprang, MD;  Location: [facility];  Service: Cardiovascular;  Laterality: N/A;   PACEMAKER INSERTION     PARS PLANA VITRECTOMY Right 07/19/2018   Procedure: VITRECTOMY WITH VITREOUS BIPOSY & ENDOLASER;  Surgeon: Jalene Mullet, MD;  Location: [facility];  Service: Ophthalmology;  Laterality:  Right;   PPM GENERATOR CHANGEOUT N/A 02/02/2017   Procedure: PPM GENERATOR CHANGEOUT;  Surgeon: Deboraha Sprang, MD;  Location: [facility];  Service: Cardiovascular;  Laterality: N/A;   REPLACEMENT TOTAL KNEE BILATERAL     RIGHT/LEFT HEART CATH AND CORONARY ANGIOGRAPHY N/A 07/31/2020   Procedure: RIGHT/LEFT HEART CATH AND CORONARY ANGIOGRAPHY;  Surgeon: Burnell Blanks, MD;  Location: [facility];  Service: Cardiovascular;  Laterality: N/A;   VARICOSE VEIN SURGERY      Family History  Problem Relation Age of Onset   Diabetes Mother    Mental illness Mother    Diabetes Father    Heart disease Father     Social History   Socioeconomic History   Marital status: Widowed    Spouse name: Not on file   Number of children: 2   Years of education: 2- year college   Highest education level: Not on file  Occupational History   Not on file  Tobacco Use   Smoking status: Former    Packs/day: 0.25    Years: 10.00    Pack years: 2.50    Types: Cigarettes    Quit date: 1980    Years since quitting: 42.5   Smokeless tobacco: Never  Vaping Use   Vaping Use: Never used  Substance and Sexual Activity   Alcohol use: No   Drug use: No   Sexual activity: Never  Other Topics Concern   Not on file  Social History Narrative   03/14/19   From: the area   Living: alone, but caretaker that - 20 hours a day of care givers, is alone from 4-8 pm   Work: retired -    Widowed: husband died from [place], used to run a golf course      Family: 2 children - [name] ([place]), [place] (lives nearby), has grandchildren and Designer, industrial/product      Enjoys: play golf (not as much), painting, crafting    Right handed      Right handed   Exercise: yard work   Diet: anything, whatever she can find      Land belts: Yes    Guns: No   Safe in relationships: Yes    Social Determinants of Radio broadcast assistant Strain: Low Risk    Difficulty  of Paying Living Expenses: Not hard at all  Food Insecurity: No Food Insecurity   Worried About Charity fundraiser in the Last Year: Never true   Arboriculturist in the Last Year: Never true  Transportation Needs: No Transportation Needs   Lack of Transportation (Medical): No   Lack of Transportation (Non-Medical): No  Physical Activity: Inactive   Days of Exercise per Week: 0 days   Minutes of Exercise per Session: 0 min  Stress: No Stress Concern Present   Feeling of Stress : Not at all  Social Connections: Not on file  Intimate Partner  Violence: Not At Risk   Fear of Current or Ex-Partner: No   Emotionally Abused: No   Physically Abused: No   Sexually Abused: No    Prior to Admission medications   Medication Sig Start Date End Date Taking? Authorizing Provider  acetaminophen (TYLENOL) 500 MG tablet Take 1,000 mg by mouth every 8 (eight) hours as needed for moderate pain or headache.    Yes [provider]  atorvastatin (LIPITOR) 20 MG tablet TAKE 1 TABLET BY MOUTH EVERY DAY IN THE EVENING 06/30/20  Yes [name], MD  Carboxymethylcell-Hypromellose (GENTEAL OP) Place 1 drop into both eyes daily as needed (dry eyes).   Yes [provider]  cetirizine (ZYRTEC) 5 MG tablet TAKE 1 TABLET BY MOUTH EVERY DAY 04/18/20  Yes [name], MD  D 2000 50 MCG [phone] UT) TABS TAKE 1 TABLET BY MOUTH EVERY DAY 07/07/20  Yes [name], MD  donepezil (ARICEPT) 10 MG tablet Take 1 tablet daily 04/18/20  Yes Cameron Sprang, MD  ELIQUIS 5 MG TABS tablet TAKE 1 TABLET BY MOUTH TWICE A DAY 08/15/20  Yes [name], MD  fluticasone (FLONASE) 50 MCG/ACT nasal spray PLACE 1 SPRAY INTO BOTH NOSTRILS DAILY AS NEEDED FOR ALLERGIES OR RHINITIS. 09/25/20  Yes [name], MD  furosemide (LASIX) 20 MG tablet TAKE 1 TABLET BY MOUTH EVERY DAY 08/21/20  Yes [name], MD  levothyroxine (SYNTHROID) 88 MCG tablet TAKE 1 TABLET BY MOUTH EVERY DAY IN THE MORNING ON AN EMPTY STOMACH  08/04/20  Yes [name], MD  lisinopril (ZESTRIL) 5 MG tablet Take 1 tablet (5 mg total) by mouth daily. 10/06/20  Yes [name], MD  oxybutynin (DITROPAN-XL) 5 MG 24 hr tablet TAKE 1 TABLET BY MOUTH EVERY DAY 06/05/20  Yes [name], MD  sertraline (ZOLOFT) 50 MG tablet Take 1 tablet (50 mg total) by mouth at bedtime. 05/08/20  Yes Cameron Sprang, MD    Current Outpatient Medications  Medication Sig Dispense Refill   acetaminophen (TYLENOL) 500 MG tablet Take 1,000 mg by mouth every 8 (eight) hours as needed for moderate pain or headache.      atorvastatin (LIPITOR) 20 MG tablet TAKE 1 TABLET BY MOUTH EVERY DAY IN THE EVENING 90 tablet 1   Carboxymethylcell-Hypromellose (GENTEAL OP) Place 1 drop into both eyes daily as needed (dry eyes).     cetirizine (ZYRTEC) 5 MG tablet TAKE 1 TABLET BY MOUTH EVERY DAY 90 tablet 2   D 2000 50 MCG (2000 UT) TABS TAKE 1 TABLET BY MOUTH EVERY DAY 90 tablet 3   donepezil (ARICEPT) 10 MG tablet Take 1 tablet daily 90 tablet 3   ELIQUIS 5 MG TABS tablet TAKE 1 TABLET BY MOUTH TWICE A DAY 180 tablet 1   fluticasone (FLONASE) 50 MCG/ACT nasal spray PLACE 1 SPRAY INTO BOTH NOSTRILS DAILY AS NEEDED FOR ALLERGIES OR RHINITIS. 16 mL 2   furosemide (LASIX) 20 MG tablet TAKE 1 TABLET BY MOUTH EVERY DAY 90 tablet 1   levothyroxine (SYNTHROID) 88 MCG tablet TAKE 1 TABLET BY MOUTH EVERY DAY IN THE MORNING ON AN EMPTY STOMACH 90 tablet 3   lisinopril (ZESTRIL) 5 MG tablet Take 1 tablet (5 mg total) by mouth daily. 90 tablet 3   oxybutynin (DITROPAN-XL) 5 MG 24 hr tablet TAKE 1 TABLET BY MOUTH EVERY DAY 90 tablet 1   sertraline (ZOLOFT) 50 MG tablet Take 1 tablet (50 mg total) by  mouth at bedtime. 30 tablet 2   No current facility-administered medications for this visit.    Allergies  Allergen Reactions   Adhesive [Tape] Other (See Comments)    TAPE WILL TEAR THE SKIN!!!!   Penicillins Hives and Rash    Did it involve swelling of the face/tongue/throat,  SOB, or low BP? No Did it involve sudden or severe rash/hives, skin peeling, or any reaction on the inside of your mouth or nose? Yes Did you need to seek medical attention at a hospital or doctor's office? Unknown  When did it last happen?      unknown  If all above answers are "NO", may proceed with cephalosporin use.       Review of Systems:   General:  +decreased appetite, +decreased energy, no weight gain, no weight loss, no fever  Cardiac:  no chest pain with exertion, no chest pain at rest, +SOB with mild exertion, no resting SOB, no PND, no orthopnea, no palpitations, + arrhythmia, + atrial fibrillation, + LE edema, no dizzy spells, no syncope  Respiratory:  + exertional shortness of breath, no home oxygen, no productive cough, no dry cough, no bronchitis, no wheezing, no hemoptysis, no asthma, no pain with inspiration or cough, no sleep apnea, no CPAP at night  GI:   no difficulty swallowing, no reflux, no frequent heartburn, no hiatal hernia, no abdominal pain, no constipation, no diarrhea, no hematochezia, no hematemesis, no melena  GU:   no dysuria,  no frequency, no urinary tract infection, no hematuria, no kidney stones, no kidney disease  Vascular:  no pain suggestive of claudication, no pain in feet, no leg cramps, no varicose veins, no DVT, no non-healing foot ulcer  Neuro:   no stroke, + TIA's, no seizures, no headaches, no temporary blindness one eye,  no slurred speech, no peripheral neuropathy, no chronic pain, + instability of gait, + memory/cognitive dysfunction  Musculoskeletal: + arthritis, no joint swelling, no myalgias, + difficulty walking, + reduced mobility   Skin:   no rash, no itching, no skin infections, no pressure sores or ulcerations  Psych:   n anxiety, ono depression, no nervousness, no unusual recent stress  Eyes:   no blurry vision, no floaters, no recent vision changes, + wears glasses or contacts  ENT:   no hearing loss, no loose or painful teeth, no  dentures, sees her dentist regularly  Hematologic:  no easy bruising, no abnormal bleeding, no clotting disorder, no frequent epistaxis  Endocrine:  no diabetes, does not check CBG's at home     Physical Exam:   BP (!) 86/50   Pulse (!) 54   Resp 20   Ht '5\' 2"'$  (1.575 m)   Wt 142 lb (64.4 kg)   SpO2 93% Comment: RA  BMI 25.97 kg/m   General:  Elderly, frail-appearing  HEENT:  Ecchymosis right forehead from prior fall, PERLA, EOMI,   Neck:   no JVD, no bruits, no adenopathy   Chest:   clear to auscultation, symmetrical breath sounds, no wheezes, no rhonchi   CV:   RRR, 3/6 systolic murmur RSB  Abdomen:  soft, non-tender, no masses   Extremities:  warm, well-perfused, pulses palpable in ankles, no lower extremity edema  Rectal/GU  Deferred  Neuro:   Grossly non-focal and symmetrical throughout  Skin:   Clean and dry, no rashes, no breakdown  Diagnostic Tests:  Lab Results: Recent Labs    10/15/20 1229 10/15/20 1234  WBC 9.2  --  HGB 10.6* 11.6*  HCT 33.9* 34.0*  PLT 222  --    BMET:  Recent Labs    10/15/20 1229 10/15/20 1234  NA 129* 129*  K 3.9 3.9  CL 92* 92*  CO2 27  --   GLUCOSE 109* 107*  BUN 18 20  CREATININE 1.32* 1.30*  CALCIUM 8.9  --     CBG (last 3)  Recent Labs    10/15/20 1228  GLUCAP 112*   PT/INR:   Recent Labs    10/15/20 1229  LABPROT 24.2*  INR 2.2*   ECHOCARDIOGRAM REPORT         Patient Name:   Mirha P Michele Date of Exam: 07/11/2020  Medical Rec #:  [id number]            Height:       61.0 in  Accession #:    [id number]           Weight:       148.8 lb  Date of Birth:  Sep 09, 1930            BSA:          1.666 m  Patient Age:    7 years             BP:           142/78 mmHg  Patient Gender: F                    HR:           74 bpm.  Exam Location:  [address]   Procedure: 2D Echo, Color Doppler and Cardiac Doppler   Indications:    I35.0 AS     History:        Patient has prior history of Echocardiogram  examinations,  most                  recent 10/30/2019. Pacemaker, AS, Arrythmias:Atrial  Fibrillation;                  Risk Factors:Hypertension.     Sonographer:    Coralyn Helling RDCS  Referring Phys: [id number] [place]     1. Left ventricular ejection fraction, by estimation, is 60 to 65%. The  left ventricle has normal function. The left ventricle has no regional  wall motion abnormalities. There is mild concentric left ventricular  hypertrophy of the basal-septal segment.  Left ventricular diastolic parameters are indeterminate.   2. Right ventricular systolic function is mildly reduced. The right  ventricular size is mildly enlarged. There is moderately elevated  pulmonary artery systolic pressure. The estimated right ventricular  systolic pressure is 0000000 mmHg.   3. Left atrial size was severely dilated.   4. Right atrial size was severely dilated.   5. The mitral valve is grossly normal. Moderate, eccentric mitral valve  regurgitation.   6. Tricuspid valve regurgitation is at least moderate.   7. The aortic valve is tricuspid. Aortic valve regurgitation is trivial.  Moderate aortic valve stenosis. Aortic valve mean gradient measures 20.4  mmHg. Aortic valve Vmax measures 3.08 m/s.   Comparison(s): A prior study was performed on 10/30/2019. Prior images  reviewed side by side. Compared to prior, left ventricle is more dilated  that prior but still within normal limits. Similar Aortic DVI from prior.   Conclusion(s)/Recommendation(s): LV stroke volume index of 27- consider  additional imaging for aortic valve stenosis only if  clinically indicated.   FINDINGS   Left Ventricle: Left ventricular ejection fraction, by estimation, is 60  to 65%. The left ventricle has normal function. The left ventricle has no  regional wall motion abnormalities. The left ventricular internal cavity  size was normal in size. There is   mild concentric left ventricular  hypertrophy of the basal-septal segment.  Left ventricular diastolic parameters are indeterminate.   Right Ventricle: The right ventricular size is mildly enlarged. No  increase in right ventricular wall thickness. Right ventricular systolic  function is mildly reduced. There is moderately elevated pulmonary artery  systolic pressure. The tricuspid  regurgitant velocity is 3.12 m/s, and with an assumed right atrial  pressure of 8 mmHg, the estimated right ventricular systolic pressure is  0000000 mmHg.   Left Atrium: Left atrial size was severely dilated.   Right Atrium: Right atrial size was severely dilated.   Pericardium: There is no evidence of pericardial effusion.   Mitral Valve: The mitral valve is grossly normal. Moderate mitral valve  regurgitation.   Tricuspid Valve: The tricuspid valve is normal in structure. Tricuspid  valve regurgitation is moderate.   Aortic Valve: The aortic valve is tricuspid. Aortic valve regurgitation is  trivial. Moderate aortic stenosis is present. Aortic valve mean gradient  measures 20.4 mmHg. Aortic valve peak gradient measures 38.0 mmHg. Aortic  valve area, by VTI measures 0.70   cm.   Pulmonic Valve: The pulmonic valve was normal in structure. Pulmonic valve  regurgitation is mild.   Aorta: The aortic root is normal in size and structure.   IAS/Shunts: The atrial septum is grossly normal.   Additional Comments: A device lead is visualized.      LEFT VENTRICLE  PLAX 2D  LVIDd:         4.60 cm  Diastology  LVIDs:         3.20 cm  LV e' medial:    5.84 cm/s  LV PW:         1.10 cm  LV E/e' medial:  28.8  LV IVS:        1.50 cm  LV e' lateral:   10.14 cm/s  LVOT diam:     1.90 cm  LV E/e' lateral: 16.6  LV SV:         45  LV SV Index:   27  LVOT Area:     2.84 cm      RIGHT VENTRICLE            IVC  RVSP:           41.9 mmHg  IVC diam: 1.40 cm   LEFT ATRIUM              Index       RIGHT ATRIUM           Index  LA diam:         5.60 cm  3.36 cm/m  RA Pressure: 3.00 mmHg  LA Vol (A2C):   122.0 ml 73.24 ml/m RA Area:     27.80 cm  LA Vol (A4C):   103.0 ml 61.84 ml/m RA Volume:   97.20 ml  58.35 ml/m  LA Biplane Vol: 112.0 ml 67.24 ml/m   AORTIC VALVE  AV Area (Vmax):    0.69 cm  AV Area (Vmean):   0.72 cm  AV Area (VTI):     0.70 cm  AV Vmax:           308.20  cm/s  AV Vmean:          205.200 cm/s  AV VTI:            0.645 m  AV Peak Grad:      38.0 mmHg  AV Mean Grad:      20.4 mmHg  LVOT Vmax:         75.44 cm/s  LVOT Vmean:        51.860 cm/s  LVOT VTI:          0.160 m  LVOT/AV VTI ratio: 0.25     AORTA  Ao Root diam: 3.00 cm  Ao Asc diam:  3.60 cm   MV E velocity: 168.20 cm/s  TRICUSPID VALVE                              TR Peak grad:   38.9 mmHg                              TR Vmax:        312.00 cm/s                              Estimated RAP:  3.00 mmHg                              RVSP:           41.9 mmHg                                 SHUNTS                              Systemic VTI:  0.16 m                              Systemic Diam: 1.90 cm   Rudean Haskell MD  Electronically signed by Rudean Haskell MD  Signature Date/Time: 07/11/2020/3:49:31 PM         Final     Procedures  RIGHT/LEFT HEART CATH AND CORONARY ANGIOGRAPHY    Conclusion    Prox Cx to Mid Cx lesion is 20% stenosed. 2nd Mrg lesion is 20% stenosed. Mid LAD lesion is 65% stenosed. Hemodynamic findings consistent with mild pulmonary hypertension.   1. Moderate, eccentric mid LAD stenosis 2. Mild disease in the proximal Circumflex and obtuse marginal branch 3. Non-dominant RCA 4. Severe paradoxical low flow/low gradient aortic stenosis by echo. Cath data: mean gradient 23.7 mmHg, peak to peak gradient 22 mmHg, AVA 0.89 cm2   Recommendations: Will continue workup for TAVR. In the absence of angina and with normal LV function, will manage the moderate stenosis in the mid LAD conservatively  with medical therapy.      Indications  Severe aortic stenosis [I35.0 (ICD-10-CM)]    Procedural Details  Technical Details Indication: 87 yo female with history of severe aortic stenosis, TAVR workup  Procedure: The risks, benefits, complications, treatment options, and expected outcomes were discussed with the patient. The patient and/or family concurred with the proposed plan, giving informed consent. The patient was brought to the cath lab after IV hydration  was given. The patient was sedated with Versed and Fentanyl. The IV catheter in the right antecubital vein was changed for a 5 [place] sheath. Right heart catheterization performed with a balloon tipped catheter. The right wrist was prepped and draped in a sterile fashion. 1% lidocaine was used for local anesthesia. Using the modified Seldinger access technique, a 5 French sheath was placed in the right radial artery. 3 mg Verapamil was given through the sheath. 3500 units IV heparin was given. Standard diagnostic catheters were used to perform selective coronary angiography. I had difficulty manipulating catheters due to abnormal takeoff of the innominate artery. A 6 Fr 85 cm Destination sheath was advanced into the ascending aorta. I crossed the aortic valve with an AL-1 catheter and the J wire.   The sheath was removed from the right radial artery and a Terumo hemostasis band was applied at the arteriotomy site on the right wrist.   Estimated blood loss <50 mL.   During this procedure medications were administered to achieve and maintain moderate conscious sedation while the patient's heart rate, blood pressure, and oxygen saturation were continuously monitored and I was present face-to-face 100% of this time.    Medications (Filter: Administrations occurring from 1206 to 1346 on 07/31/20)  Heparin (Porcine) in NaCl 1000-0.9 UT/500ML-% SOLN (mL) Total volume:  1,000 mL  Date/Time Rate/Dose/Volume Action   07/31/20 1218 500 mL  Given   1218 500 mL Given    lidocaine (PF) (XYLOCAINE) 1 % injection (mL) Total volume:  4 mL  Date/Time Rate/Dose/Volume Action   07/31/20 1230 2 mL Given   1230 2 mL Given    Radial Cocktail/Verapamil only (mL) Total volume:  10 mL  Date/Time Rate/Dose/Volume Action   07/31/20 1233 10 mL Given    midazolam (VERSED) injection (mg) Total dose:  0.5 mg  Date/Time Rate/Dose/Volume Action   07/31/20 1301 0.5 mg Given    fentaNYL (SUBLIMAZE) injection (mcg) Total dose:  25 mcg  Date/Time Rate/Dose/Volume Action   07/31/20 1301 25 mcg Given    heparin sodium (porcine) injection (Units) Total dose:  3,500 Units  Date/Time Rate/Dose/Volume Action   07/31/20 1313 3,500 Units Given    iohexol (OMNIPAQUE) 350 MG/ML injection (mL) Total volume:  70 mL  Date/Time Rate/Dose/Volume Action   07/31/20 1338 70 mL Given     Sedation Time  Sedation Time Physician-1: 36 minutes 24 seconds   Contrast  Medication Name Total Dose  iohexol (OMNIPAQUE) 350 MG/ML injection 70 mL    Radiation/Fluoro  Fluoro time: 12.2 (min) DAP: 19316 (mGycm2) Cumulative Air Kerma: [id number] (mGy)  Complications   Complications documented before study signed (07/31/2020  1:59 PM)     RIGHT/LEFT HEART CATH AND CORONARY ANGIOGRAPHY  None Documented by Burnell Blanks, MD 07/31/2020  1:57 PM  Date Found: 07/31/2020  Time Range: Intraprocedure        Coronary Findings   Diagnostic Dominance: Left  Left Anterior Descending  Vessel is large.  Mid LAD lesion is 65% stenosed.  Left Circumflex  Vessel is large.  Prox Cx to Mid Cx lesion is 20% stenosed.  Second Obtuse Marginal Branch  2nd Mrg lesion is 20% stenosed.   Intervention   No interventions have been documented.    Right Heart  Right Heart Pressures Hemodynamic findings consistent with mild pulmonary hypertension. Elevated LV EDP consistent with volume overload.          Coronary  Diagrams   Diagnostic Dominance: Left    Intervention  Implants     No implant documentation for this case.    Syngo Images   Show images for CARDIAC CATHETERIZATION  Images on Long Term Storage   Show images for Marguret, Slingsby "Becky"  Link to Procedure Log  Procedure Log      Hemo Data  Flowsheet Row Most Recent Value  Fick Cardiac Output 3.2 L/min  Fick Cardiac Output Index 1.85 (L/min)/BSA  Aortic Mean Gradient 23.66 mmHg  Aortic Peak Gradient 22 mmHg  Aortic Valve Area 0.89  Aortic Value Area Index 0.51 cm2/BSA  RA A Wave 12 mmHg  RA V Wave 17 mmHg  RA Mean 13 mmHg  RV Systolic Pressure 45 mmHg  RV Diastolic Pressure 3 mmHg  RV EDP 12 mmHg  PA Systolic Pressure 49 mmHg  PA Diastolic Pressure 17 mmHg  PA Mean 32 mmHg  PW A Wave 16 mmHg  PW V Wave 42 mmHg  PW Mean 24 mmHg  AO Systolic Pressure [id number] mmHg  AO Diastolic Pressure 73 mmHg  AO Mean [id number] mmHg  LV Systolic Pressure 0000000 mmHg  LV Diastolic Pressure 17 mmHg  LV EDP 18 mmHg  AOp Systolic Pressure A999333 mmHg  AOp Diastolic Pressure 71 mmHg  AOp Mean Pressure 98 mmHg  LVp Systolic Pressure 123456 mmHg  LVp Diastolic Pressure 18 mmHg  LVp EDP Pressure 22 mmHg  QP/QS 1  TPVR Index 17.3 HRUI  TSVR Index 55.16 HRUI  PVR SVR Ratio 0.09  TPVR/TSVR Ratio 0.31     ADDENDUM REPORT: 08/12/2020 20:21   CLINICAL DATA:  Severe Aortic Stenosis.   EXAM: Cardiac TAVR CT   TECHNIQUE: A non-contrast, gated CT scan was obtained with axial slices of 3 mm through the heart for aortic valve calcium scoring. A 110 kV retrospective, gated, contrast cardiac scan was obtained. Gantry rotation speed was 250 msecs and collimation was 0.6 mm. Nitroglycerin was not given. The 3D data set was reconstructed in 5% intervals of the 0-95% of the R-R cycle. Systolic and diastolic phases were analyzed on a dedicated workstation using MPR, MIP, and VRT modes. The patient received 95 cc of contrast.    FINDINGS: Image quality: Excellent.  Mild cardiac motion artifact.   Noise artifact is: Limited.   Valve Morphology: The aortic valve is tricuspid. There is severely restricted motion in systole consistent with severe aortic stenosis. The leaflets are diffusely calcified.   Aortic Valve Calcium score: 1458   Aortic annular dimension:   Phase assessed: 15%   Annular area: 360 mm2   Annular perimeter: 68.8 mm   Max diameter: 24.8 mm   Min diameter: 19.7 mm   Annular and subannular calcification: None.   Optimal coplanar projection: LAO 15 CAU 10   Coronary Artery Height above Annulus:   Left Main: 12.6 mm   Right Coronary: 13.3 mm   Sinus of Valsalva Measurements:   Non-coronary: 29 mm   Right-coronary: 28 mm   Left-coronary: 28 mm   Sinus of Valsalva Height:   Non-coronary: 17.2 mm   Right-coronary: 17.4 mm   Left-coronary: 19.9 mm   Sinotubular Junction: 28 mm   Ascending Thoracic Aorta: 36 mm   Coronary Arteries: Normal coronary origin. Left dominance. The study was performed without use of NTG and is insufficient for plaque evaluation. Please refer to recent cardiac catheterization for coronary assessment.   Cardiac Morphology:   Right Atrium: Right atrial size is severely dilated. There is contrast reflux into the IVC consistent with elevated RA pressure. The  IAS bows into the LA consistent with elevated RA pressure. There are 2 pacemaker leads that terminate in the RV. One of the leads originates from the right chest and the other from the left chest.   Right Ventricle: The right ventricular cavity is dilated.   Left Atrium: Left atrial size is severely dilated. There is contrast mixing artifact in the LAA, cannot exclude thrombus.   Left Ventricle: The ventricular cavity size is within normal limits. There are no stigmata of prior infarction. There is no abnormal filling defect. Normal left ventricular function, LVEF=75%. No regional  wall motion abnormalities.   Pulmonary arteries: Dilated suggestive of pulmonary hypertension. No proximal filling defect.   Pulmonary veins: Normal pulmonary venous drainage.   Pericardium: Normal thickness with no significant effusion or calcium present.   Mitral Valve: The mitral valve is normal structure without significant calcification.   Extra-cardiac findings: See attached radiology report for non-cardiac structures.   IMPRESSION: 1. Tricuspid aortic valve with severe aortic stenosis (calcium score 1458).   2. Annular measurements appropriate for 23 mm S3 (360 mm2), but would consider 26 mm Evolut Pro.   3. No significant annular or subannular calcifications.   4. Sufficient coronary to annulus distance.   5. Optimal Fluoroscopic Angle for Delivery: LAO 15 CAU 10   6. Dilated RA/RV with contrast reflux into the IVC consistent with elevated RA pressure.   7. Dilated pulmonary artery consistent with pulmonary hypertension.   8. There is contrast mixing artifact in the LAA, cannot exclude thrombus.   Lake Bells T. Audie Box, MD     Electronically Signed   By: Eleonore Chiquito   On: 08/12/2020 20:21        Narrative & Impression  CLINICAL DATA:  87 year old female with history of severe aortic stenosis. Preprocedural study prior to potential transcatheter aortic valve replacement (TAVR) procedure.   EXAM: CT ANGIOGRAPHY CHEST, ABDOMEN AND PELVIS   TECHNIQUE: Multidetector CT imaging through the chest, abdomen and pelvis was performed using the standard protocol during bolus administration of intravenous contrast. Multiplanar reconstructed images and MIPs were obtained and reviewed to evaluate the vascular anatomy.   CONTRAST:  74m OMNIPAQUE IOHEXOL 350 MG/ML SOLN   COMPARISON:  Chest CT 11/20/2017. CT of the abdomen and pelvis 05/30/2020.   FINDINGS: CTA CHEST FINDINGS   Cardiovascular: Heart size is enlarged with severe biatrial dilatation. There is  no significant pericardial fluid, thickening or pericardial calcification. There is aortic atherosclerosis, as well as atherosclerosis of the great vessels of the mediastinum and the coronary arteries, including calcified atherosclerotic plaque in the left anterior descending coronary artery. Severe thickening calcification of the aortic valve. Left-sided pacemaker device in place with lead tips terminating in the right ventricular apex. Abandoned right-sided pacemaker wire also with tip in the right ventricular apex.   Mediastinum/Lymph Nodes: No pathologically enlarged mediastinal or hilar lymph nodes. Esophagus is unremarkable in appearance. No axillary lymphadenopathy.   Lungs/Pleura: 3 mm medial right upper lobe pulmonary nodule (axial image 63 of series 7). No other larger more suspicious appearing pulmonary nodules or masses are noted. No acute consolidative airspace disease. No pleural effusions. Mild diffuse ground-glass attenuation and interlobular septal thickening, suggesting a background of mild interstitial pulmonary edema. Linear scarring in the right lower lobe.   Musculoskeletal/Soft Tissues: There are no aggressive appearing lytic or blastic lesions noted in the visualized portions of the skeleton.   CTA ABDOMEN AND PELVIS FINDINGS   Hepatobiliary: Liver is enlarged measuring up to 21.5 cm in  craniocaudal span. Diffuse low attenuation throughout the hepatic parenchyma, indicative of a background of hepatic steatosis. No definite suspicious cystic or solid hepatic lesions. No intra or extrahepatic biliary ductal dilatation. Status post cholecystectomy. Profound dilatation of the intrahepatic portion of the IVC and the hepatic veins which opacify with contrast, suggesting passive hepatic congestion from right-sided heart failure.   Pancreas: No pancreatic mass. No pancreatic ductal dilatation. No pancreatic or peripancreatic fluid collections or  inflammatory changes.   Spleen: Unremarkable.   Adrenals/Urinary Tract: Bilateral kidneys and bilateral adrenal glands are normal in appearance. No hydroureteronephrosis. Urinary bladder is normal in appearance.   Stomach/Bowel: The appearance of the stomach is normal. There is no pathologic dilatation of small bowel or colon. The appendix is not confidently identified and may be surgically absent. Regardless, there are no inflammatory changes noted adjacent to the cecum to suggest the presence of an acute appendicitis at this time.   Vascular/Lymphatic: Aortic atherosclerosis, with vascular findings and measurements pertinent to potential TAVR procedure, as detailed below. No aneurysm or dissection noted in the abdominal or pelvic vasculature. No lymphadenopathy noted in the abdomen or pelvis.   Reproductive: Status post hysterectomy. Ovaries are not confidently identified and may be surgically absent or atrophic. Pessary present in the vagina incidentally noted.   Other: No significant volume of ascites.  No pneumoperitoneum.   Musculoskeletal: There are no aggressive appearing lytic or blastic lesions noted in the visualized portions of the skeleton.   VASCULAR MEASUREMENTS PERTINENT TO TAVR:   AORTA:   Minimal Aortic Diameter-10 x 11 mm   Severity of Aortic Calcification-moderate   RIGHT PELVIS:   Right Common Iliac Artery -   Minimal Diameter-6.8 x 5.5 mm   Tortuosity-mild   Calcification-mild-to-moderate   Right External Iliac Artery -   Minimal Diameter-6.3 x 6.7 mm   Tortuosity-moderate to severe   Calcification-none   Right Common Femoral Artery -   Minimal Diameter-5.6 x 6.8 mm   Tortuosity-mild   Calcification-mild   LEFT PELVIS:   Left Common Iliac Artery -   Minimal Diameter-7.1 x 6.8 mm   Tortuosity-mild-to-moderate   Calcification-mild-to-moderate   Left External Iliac Artery -   Minimal Diameter-6.6 x 6.7 mm    Tortuosity-moderate to severe   Calcification-none   Left Common Femoral Artery -   Minimal Diameter-6.2 x 5.9 mm   Tortuosity-mild   Calcification-mild   Review of the MIP images confirms the above findings.   IMPRESSION: 1. Vascular findings and measurements pertinent to potential TAVR procedure, as detailed above. 2. Severe thickening calcification of the aortic valve, compatible with reported clinical history of severe aortic stenosis. 3. Cardiomegaly with severe biatrial dilatation. This is associated with mild and interlobular diffuse septal ground-glass attenuation thickening suggesting pulmonary edema from congestive heart failure. In addition, there is evidence of passive hepatic congestion. 4. Hepatomegaly with hepatic steatosis. 5. Aortic atherosclerosis, in addition to left anterior descending coronary artery disease. 6. Additional incidental findings, as above.     Electronically Signed   By: Vinnie Langton M.D.   On: 08/12/2020 13:58       Impression:  This 87 year old woman has stage D, severe, low-flow/low gradient symptomatic aortic stenosis with New York Heart Association class III symptoms of exertional fatigue and shortness of breath consistent with chronic diastolic congestive heart failure.  I have personally reviewed her 2D echocardiogram, cardiac catheterization, and CTA studies.  Her echocardiogram shows a trileaflet aortic valve with severe thickening and calcification and restricted leaflet mobility.  The mean gradient is 20 mmHg with a peak gradient of 38 mmHg.  Aortic valve area by VTI 0.7 cm.  Stroke-volume index is 27 consistent with low flow/low gradient severe aortic stenosis.  Her valve certainly looks visually like a severely stenotic valve.  Cardiac catheterization showed 65% mid LAD stenosis and mild nonobstructive disease in the left circumflex.  The mean gradient was 24 mmHg with a peak to peak gradient of 22 mmHg and a valve area  measured at 0.89 cm.  I agree that she appears to have low flow/low gradient severe aortic stenosis with worsening symptoms and would benefit from aortic valve placement.  She is not a candidate for open surgical aortic valve replacement due to her advanced age and comorbidities but I think transcatheter aortic valve replacement would be a reasonable alternative.  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 Angelena Form had told her.  She can give me a good history of her symptoms and progression with a fair amount of detail.  Her gated cardiac CTA shows anatomy suitable for TAVR using a SAPIEN 3 valve.  Her abdominal and pelvic CTA shows adequate pelvic vascular anatomy to allow transfemoral insertion.  I had a long discussion with the patient and her daughter about transcatheter aortic valve replacement and the potential benefits and risks including but not limited to stroke, worsening of cognitive deficits and the possibility that she may be less independent afterwards.  The patient says that she is very symptomatic and would like to proceed with surgery and take the risk with the hope of being more functional and improving her shortness of breath.  Unfortunately as we were wrapping up our conversation she suddenly became less responsive and had slurred speech and said she knew that something was wrong.  Her daughter has never known her to have these symptoms and I think that most likely she is having a TIA or stroke.  We called EMS who arrived to take the patient to the [facility] emergency room.   Plan:  The patient was taken to the [facility] emergency room by EMS for suspected TIA/stroke and we will put transcatheter aortic valve replacement on hold for now.  I spent 60 minutes performing this consultation and > 50% of this time was spent face to face counseling and coordinating the care of this patient's severe symptomatic  aortic stenosis.  Gaye Pollack, MD 10/15/2020 1:32 PM

## 2020-10-15 NOTE — Consult Note (Addendum)
NEURO HOSPITALIST CONSULT NOTE   Requestig physician: Margarita Mail, PA  Reason for Consult:Acute onset of dysphasia and left sided facial droop  History obtained from:  EMS and Chart     HPI:                                                                                                                                          [name] is an 87 y.o. female with PMH significant for atrial fibrillation on Eliquis, pacemaker, TIA, aortic stenosis,mild dementia and memory loss, cardiac pacemaker placement and  falls who presented to the [facility] ED from her Cardiology office after she had sudden onset of slurred speech while being evaluated by the Cardiologist.   Patient was at her [place] office for valve replacement work-up. Per Dr. Cyndia Bent he had been talking to the patient and daughter for about 45 minutes. Her speech became slurred and she was leaning to the left side.   She also stated hat she did not feel well. EMS was called for possible stroke. Of note patient had a fall 09/26/20 and was seen in ED. Per report patient has had increasing falls lately with the last fall occurring 7/8. She also receives 24 hour nursing care in her home.   LKW: 11:45 10/15/2020 TPA: No, on Eliquis CTH: no hemorrhage or large vascular territory infarct. ASPECTS 10 CTA: no LVO , severe right P2 stenosis BG: 126  BP: 136/86 NIHSS: 2 mRS: 2  Past Medical History:  Diagnosis Date   Chronic anticoagulation    Chronic atrial fibrillation ([place]) 01/28/2017   Chronic venous insufficiency 01/28/2017   Clavicle fracture 11/20/2017   Long term current use of anticoagulant therapy 01/28/2017   Osteoarthritis    Presence of permanent cardiac pacemaker 01/28/2017   Original implant 1992 [employer] 431-04 Generator change 2001 and 2011.  Medtronic generator.    Severe aortic stenosis    SSS (sick sinus syndrome) (HCC)    Thyroid disease    hypothyroidism   TIA (transient  ischemic attack)     Past Surgical History:  Procedure Laterality Date   ABDOMINAL HYSTERECTOMY     INSERT / REPLACE / REMOVE PACEMAKER     generator change 2011 medtronic sigma   KNEE SURGERY     LEAD INSERTION N/A 03/05/2019   Procedure: LEAD INSERTION - RV LEAD;  Surgeon: Deboraha Sprang, MD;  Location: [place] [facility];  Service: Cardiovascular;  Laterality: N/A;   PACEMAKER IMPLANT N/A 03/05/2019   Procedure: PACEMAKER IMPLANT;  Surgeon: Deboraha Sprang, MD;  Location: [place] [facility];  Service: Cardiovascular;  Laterality: N/A;   PACEMAKER INSERTION     PARS PLANA VITRECTOMY Right 07/19/2018   Procedure: VITRECTOMY WITH VITREOUS BIPOSY & ENDOLASER;  Surgeon: Posey Pronto,  Dareen Piano, MD;  Location: [place];  Service: Ophthalmology;  Laterality: Right;   PPM GENERATOR CHANGEOUT N/A 02/02/2017   Procedure: PPM GENERATOR CHANGEOUT;  Surgeon: Deboraha Sprang, MD;  Location: [place] [facility];  Service: Cardiovascular;  Laterality: N/A;   REPLACEMENT TOTAL KNEE BILATERAL     RIGHT/LEFT HEART CATH AND CORONARY ANGIOGRAPHY N/A 07/31/2020   Procedure: RIGHT/LEFT HEART CATH AND CORONARY ANGIOGRAPHY;  Surgeon: Burnell Blanks, MD;  Location: [place] [facility];  Service: Cardiovascular;  Laterality: N/A;   VARICOSE VEIN SURGERY      Family History  Problem Relation Age of Onset   Diabetes Mother    Mental illness Mother    Diabetes Father    Heart disease Father         Social History:  reports that she quit smoking about 42 years ago. Her smoking use included cigarettes. She has a 2.50 pack-year smoking history. She has never used smokeless tobacco. She reports that she does not drink alcohol and does not use drugs.  Allergies  Allergen Reactions   Adhesive [Tape] Other (See Comments)    TAPE WILL TEAR THE SKIN!!!!   Penicillins Hives and Rash    Did it involve swelling of the face/tongue/throat, SOB, or low BP? No Did it involve sudden or severe rash/hives, skin peeling, or any  reaction on the inside of your mouth or nose? Yes Did you need to seek medical attention at a hospital or doctor's office? Unknown  When did it last happen?      unknown  If all above answers are "NO", may proceed with cephalosporin use.     MEDICATIONS:                                                                                                                     No current facility-administered medications on file prior to encounter.   Current Outpatient Medications on File Prior to Encounter  Medication Sig Dispense Refill   acetaminophen (TYLENOL) 500 MG tablet Take 1,000 mg by mouth every 8 (eight) hours as needed for moderate pain or headache.      atorvastatin (LIPITOR) 20 MG tablet TAKE 1 TABLET BY MOUTH EVERY DAY IN THE EVENING 90 tablet 1   Carboxymethylcell-Hypromellose (GENTEAL OP) Place 1 drop into both eyes daily as needed (dry eyes).     cetirizine (ZYRTEC) 5 MG tablet TAKE 1 TABLET BY MOUTH EVERY DAY 90 tablet 2   D 2000 50 MCG (2000 UT) TABS TAKE 1 TABLET BY MOUTH EVERY DAY 90 tablet 3   donepezil (ARICEPT) 10 MG tablet Take 1 tablet daily 90 tablet 3   ELIQUIS 5 MG TABS tablet TAKE 1 TABLET BY MOUTH TWICE A DAY 180 tablet 1   fluticasone (FLONASE) 50 MCG/ACT nasal spray PLACE 1 SPRAY INTO BOTH NOSTRILS DAILY AS NEEDED FOR ALLERGIES OR RHINITIS. 16 mL 2   furosemide (LASIX) 20 MG tablet TAKE 1 TABLET BY MOUTH EVERY DAY 90 tablet 1   levothyroxine (  SYNTHROID) 88 MCG tablet TAKE 1 TABLET BY MOUTH EVERY DAY IN THE MORNING ON AN EMPTY STOMACH 90 tablet 3   lisinopril (ZESTRIL) 5 MG tablet Take 1 tablet (5 mg total) by mouth daily. 90 tablet 3   oxybutynin (DITROPAN-XL) 5 MG 24 hr tablet TAKE 1 TABLET BY MOUTH EVERY DAY 90 tablet 1   sertraline (ZOLOFT) 50 MG tablet Take 1 tablet (50 mg total) by mouth at bedtime. 30 tablet 2     ROS:                                                                                                                                        History unobtainable from patient due to mental status  Blood pressure 129/77, pulse 70, resp. rate 11, weight 65.8 kg, SpO2 91 %.   General Examination:                                                                                                       Physical Exam  HEENT-  Normocephalic, no lesions, without obvious abnormality.  Normal external eye and conjunctiva.   Cardiovascular- pulses palpable throughout   Lungs- no excessive working breathing.  Saturations within normal limits Abdomen- All 4 quadrants palpated and nontender Extremities- Warm, dry and intact Musculoskeletal-no joint tenderness, deformity or swelling Skin-warm and dry, no hyperpigmentation, vitiligo, or suspicious lesions  Neurological Examination Mental Status: Alert, oriented to place but not time. Speech fluent without evidence of aphasia, except for naming, which is mildly impaired.  Able to follow 3 step commands slowly. Dysarthria noted.   Cranial Nerves: II: Visual fields grossly normal, PERRL III,IV, VI: ptosis not present, extra-ocular motions intact bilaterally  V,VII: slight left facial droop, facial light touch sensation normal bilaterally VIII: hearing normal bilaterally IX,X: uvula rises symmetrically XI: bilateral shoulder shrug XII: midline tongue extension Motor: Right : Upper extremity   5/5  Left:     Upper extremity   5/5  Lower extremity   5/5  Lower extremity   5/5 Tone and bulk:normal tone throughout; no atrophy noted Sensory: light touch intact throughout, bilaterally Deep Tendon Reflexes: 2+ and symmetric throughout Cerebellar: Normal finger-to-nose  Gait: Deferred   Lab Results: Basic Metabolic Panel: Recent Labs  Lab 10/15/20 1229 10/15/20 1234  NA 129* 129*  K 3.9 3.9  CL 92* 92*  CO2 27  --   GLUCOSE 109* 107*  BUN 18 20  CREATININE 1.32* 1.30*  CALCIUM 8.9  --  CBC: Recent Labs  Lab 10/15/20 1229 10/15/20 1234  WBC 9.2  --   NEUTROABS 7.8*  --    HGB 10.6* 11.6*  HCT 33.9* 34.0*  MCV 85.0  --   PLT 222  --     Imaging: CT HEAD CODE STROKE WO CONTRAST  Result Date: 10/15/2020 CLINICAL DATA:  Neuro deficit, acute stroke suspected. EXAM: CT ANGIOGRAPHY HEAD AND NECK TECHNIQUE: Multidetector CT imaging of the head and neck was performed using the standard protocol during bolus administration of intravenous contrast. Multiplanar CT image reconstructions and MIPs were obtained to evaluate the vascular anatomy. Carotid stenosis measurements (when applicable) are obtained utilizing NASCET criteria, using the distal internal carotid diameter as the denominator. CONTRAST:  41m OMNIPAQUE IOHEXOL 350 MG/ML SOLN COMPARISON:  CT head from 09/26/2020. FINDINGS: CT HEAD FINDINGS Brain: No evidence of acute large vascular territory infarction, hemorrhage, hydrocephalus, extra-axial collection or mass lesion/mass effect. Similar patchy white matter hypoattenuation, nonspecific but most likely related to chronic microvascular ischemic disease that is moderate. Also, moderate atrophy with ex vacuo ventricular dilation, similar to prior. ASPECTS 10. Vascular: See below. Skull: No acute fracture. Sinuses: Visualized sinuses are clear. Orbits: No acute orbital findings. 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 Right carotid system: There is predominately calcific atherosclerosis at the carotid bifurcation without greater than 50% narrowing. Left carotid system: There is predominant calcific atherosclerosis at the carotid bifurcation without greater than 50% narrowing. Vertebral arteries: Mildly left dominant. No evidence of significant (greater than 50%) stenosis. Skeleton: No evidence of acute abnormality.  Osteopenia. Other neck: No acute abnormality. Upper chest:  Emphysema. No consolidation visualized lung apices. Scattered ground-glass opacities, likely related to expiratory imaging. Review of the MIP images confirms the above findings CTA HEAD FINDINGS Anterior circulation: The intracranial ICA is are patent bilaterally. There is bilateral calcific atherosclerosis without evidence of greater than 50% stenosis. Bilateral MCAs and ACAs are patent without evidence of proximal hemodynamically significant stenosis. Posterior circulation: Left dominant vertebral vertebral artery. The intradural vertebral arteries, basilar artery, and posterior cerebral arteries are patent. Severe right P2 PCA stenosis. Bilateral posterior communicating arteries. Venous sinuses: As permitted by contrast timing, patent. Review of the MIP images confirms the above findings IMPRESSION: CT head: 1. No evidence of acute large vascular territory infarct or acute hemorrhage. ASPECTS 10. 2. Moderate atrophy and chronic microvascular ischemic disease. CTA Head: 1. No large vessel occlusion. 2. Severe right P2 PCA stenosis. CTA Neck: 1. Bilateral carotid bifurcation atherosclerosis without greater than 50% narrowing. 2. 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 Recommend annual imaging followup by CTA or MRA. This recommendation follows 2010 ACCF/AHA/AATS/ACR/ASA/SCA/SCAI/SIR/STS/SVM Guidelines for the Diagnosis and Management of Patients with Thoracic Aortic Disease. Circulation.2010; 121:[id number] Aortic aneurysm NOS (ICD10-I71.9) 3. Dilation of the partially imaged main pulmonary artery up to 3.4 cm, which can be seen with pulmonary arterial hypertension. 4. Emphysema. Code stroke imaging results (inclduing major findings for CT head and CTA) were communicated on 10/15/2020 at 12:57 pm to provider JEvelena Peat(neurology NP) via telephone, who verbally acknowledged these results. Right PCA stenosis finding paged to Dr. LCheral Markerat 1:24 PM via [facility]. Electronically  Signed   By: FMargaretha SheffieldMD   On: 10/15/2020 13:28   CT ANGIO HEAD CODE STROKE  Result Date: 10/15/2020 CLINICAL DATA:  Neuro deficit, acute stroke suspected. EXAM: CT ANGIOGRAPHY HEAD AND NECK TECHNIQUE: Multidetector CT imaging of the head and neck was performed using the standard protocol during bolus administration of intravenous contrast. Multiplanar CT image reconstructions and MIPs were obtained to evaluate the vascular anatomy. Carotid stenosis measurements (when applicable) are obtained utilizing NASCET criteria, using the distal internal carotid diameter as the denominator. CONTRAST:  22m OMNIPAQUE IOHEXOL 350 MG/ML SOLN COMPARISON:  CT head from 09/26/2020. FINDINGS: CT HEAD FINDINGS Brain: No evidence of acute large vascular territory infarction, hemorrhage, hydrocephalus, extra-axial collection or mass lesion/mass effect. Similar patchy white matter hypoattenuation, nonspecific but most likely related to chronic microvascular ischemic disease that is moderate. Also, moderate atrophy with ex vacuo ventricular dilation, similar to prior. ASPECTS 10. Vascular: See below. Skull: No acute fracture. Sinuses: Visualized sinuses are clear. Orbits: No acute orbital findings. 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 Right carotid system: There is predominately calcific atherosclerosis at the carotid bifurcation without greater than 50% narrowing. Left carotid system: There is predominant calcific atherosclerosis at the carotid bifurcation without greater than 50% narrowing. Vertebral arteries: Mildly left dominant. No evidence of significant (greater than 50%) stenosis. Skeleton: No evidence of acute abnormality.  Osteopenia. Other neck: No acute abnormality. Upper chest: Emphysema. No consolidation  visualized lung apices. Scattered ground-glass opacities, likely related to expiratory imaging. Review of the MIP images confirms the above findings CTA HEAD FINDINGS Anterior circulation: The intracranial ICA is are patent bilaterally. There is bilateral calcific atherosclerosis without evidence of greater than 50% stenosis. Bilateral MCAs and ACAs are patent without evidence of proximal hemodynamically significant stenosis. Posterior circulation: Left dominant vertebral vertebral artery. The intradural vertebral arteries, basilar artery, and posterior cerebral arteries are patent. Severe right P2 PCA stenosis. Bilateral posterior communicating arteries. Venous sinuses: As permitted by contrast timing, patent. Review of the MIP images confirms the above findings IMPRESSION: CT head: 1. No evidence of acute large vascular territory infarct or acute hemorrhage. ASPECTS 10. 2. Moderate atrophy and chronic microvascular ischemic disease. CTA Head: 1. No large vessel occlusion. 2. Severe right P2 PCA stenosis. CTA Neck: 1. Bilateral carotid bifurcation atherosclerosis without greater than 50% narrowing. 2. 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 Recommend annual imaging followup by CTA or MRA. This recommendation follows 2010 ACCF/AHA/AATS/ACR/ASA/SCA/SCAI/SIR/STS/SVM Guidelines for the Diagnosis and Management of Patients with Thoracic Aortic Disease. Circulation.2010; 121:[id number] Aortic aneurysm NOS (ICD10-I71.9) 3. Dilation of the partially imaged main pulmonary artery up to 3.4 cm, which can be seen with pulmonary arterial hypertension. 4. Emphysema. Code stroke imaging results (inclduing major findings for CT head and CTA) were communicated on 10/15/2020 at 12:57 pm to provider JEvelena Peat(neurology NP) via telephone, who verbally acknowledged these results. Right PCA stenosis finding paged to Dr. LCheral Markerat 1:24 PM via [facility]aging. Electronically Signed   By: FMargaretha SheffieldMD   On: 10/15/2020 13:28   CT ANGIO NECK CODE STROKE  Result Date: 10/15/2020 CLINICAL DATA:  Neuro deficit, acute stroke suspected. EXAM: CT ANGIOGRAPHY HEAD AND NECK TECHNIQUE: Multidetector CT imaging of the head and neck was performed using the standard protocol during bolus administration of intravenous contrast. Multiplanar CT image reconstructions and MIPs were obtained to evaluate the vascular anatomy. Carotid  stenosis measurements (when applicable) are obtained utilizing NASCET criteria, using the distal internal carotid diameter as the denominator. CONTRAST:  53m OMNIPAQUE IOHEXOL 350 MG/ML SOLN COMPARISON:  CT head from 09/26/2020. FINDINGS: CT HEAD FINDINGS Brain: No evidence of acute large vascular territory infarction, hemorrhage, hydrocephalus, extra-axial collection or mass lesion/mass effect. Similar patchy white matter hypoattenuation, nonspecific but most likely related to chronic microvascular ischemic disease that is moderate. Also, moderate atrophy with ex vacuo ventricular dilation, similar to prior. ASPECTS 10. Vascular: See below. Skull: No acute fracture. Sinuses: Visualized sinuses are clear. Orbits: No acute orbital findings. 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 Right carotid system: There is predominately calcific atherosclerosis at the carotid bifurcation without greater than 50% narrowing. Left carotid system: There is predominant calcific atherosclerosis at the carotid bifurcation without greater than 50% narrowing. Vertebral arteries: Mildly left dominant. No evidence of significant (greater than 50%) stenosis. Skeleton: No evidence of acute abnormality.  Osteopenia. Other neck: No acute abnormality. Upper chest: Emphysema. No consolidation visualized lung apices. Scattered  ground-glass opacities, likely related to expiratory imaging. Review of the MIP images confirms the above findings CTA HEAD FINDINGS Anterior circulation: The intracranial ICA is are patent bilaterally. There is bilateral calcific atherosclerosis without evidence of greater than 50% stenosis. Bilateral MCAs and ACAs are patent without evidence of proximal hemodynamically significant stenosis. Posterior circulation: Left dominant vertebral vertebral artery. The intradural vertebral arteries, basilar artery, and posterior cerebral arteries are patent. Severe right P2 PCA stenosis. Bilateral posterior communicating arteries. Venous sinuses: As permitted by contrast timing, patent. Review of the MIP images confirms the above findings IMPRESSION: CT head: 1. No evidence of acute large vascular territory infarct or acute hemorrhage. ASPECTS 10. 2. Moderate atrophy and chronic microvascular ischemic disease. CTA Head: 1. No large vessel occlusion. 2. Severe right P2 PCA stenosis. CTA Neck: 1. Bilateral carotid bifurcation atherosclerosis without greater than 50% narrowing. 2. 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 Recommend annual imaging followup by CTA or MRA. This recommendation follows 2010 ACCF/AHA/AATS/ACR/ASA/SCA/SCAI/SIR/STS/SVM Guidelines for the Diagnosis and Management of Patients with Thoracic Aortic Disease. Circulation.2010; 121:[id number] Aortic aneurysm NOS (ICD10-I71.9) 3. Dilation of the partially imaged main pulmonary artery up to 3.4 cm, which can be seen with pulmonary arterial hypertension. 4. Emphysema. Code stroke imaging results (inclduing major findings for CT head and CTA) were communicated on 10/15/2020 at 12:57 pm to provider JEvelena Peat(neurology NP) via telephone, who verbally acknowledged these results. Right PCA stenosis finding paged to Dr. LCheral Markerat 1:24 PM via [facility]paging. Electronically Signed   By: FMargaretha SheffieldMD   On: 10/15/2020 13:28     Assessment : 87year old female with mild dementia presenting with acute onset of dysarthria, dysphasia, left facial droop an leaning to the left. Home medications include Eliquis, atorvastatin, Aricept, synthroid, and sertraline. 1. On exam patient had some dysarthria, slight left facial droop. No muscle weakness in BUE and BLE. 2. CTH did not show a hemorrhage and the CTA did not show an LVO. Therefore, not a thrombectomy candidate. 3. Detailed imaging findings: - CT head: No evidence of acute large vascular territory infarct or acute hemorrhage. ASPECTS 10. Moderate atrophy and chronic microvascular ischemic disease.  - CTA Head: No large vessel occlusion. Severe right P2  PCA stenosis.  - CTA Neck: Bilateral carotid bifurcation atherosclerosis without greater than 50% narrowing. 4. Will recommend full TIA work-up.  Recommendations: -- BP goal : Modified permissive HTN protocol given advanced age. SBP up to 180 (for 24-48 post admission), after which goal is to normalize BP < 140/90 by discharge. --HgbA1c, fasting lipid panel --MRI (if pacemaker compatible, otherwise repeat head CT tomorrow) --PT consult, OT consult, Speech consult --Echocardiogram --If LDL > 70 consider high intensity statin, but given patient's advanced age also note that overall risks may outweigh benefits over the short and medium term --Continue Eliquis --Telemetry monitoring --Frequent neuro checks --NPO until passes stroke swallow screen  Laurey Morale, MSN, NP-C [facility] [phone]  Electronically signed: Dr. Kerney Elbe 10/15/2020, 12:34 PM

## 2020-10-15 NOTE — Progress Notes (Signed)
MRI staff confirmed that due to abandoned leads from pt's PPM, it is contraindicated for her to have MRI. MRI canceled. Repeat CT head ordered for tomorrow.

## 2020-10-15 NOTE — ED Triage Notes (Signed)
BIB [facility] after staff at Cardiology center called to report pt having new onset aphasia, dysphagia, and left sided facial droop. LKW 1145.  Pt takes Eliquis

## 2020-10-16 ENCOUNTER — Observation Stay (HOSPITAL_COMMUNITY): Payer: Medicare Other

## 2020-10-16 ENCOUNTER — Observation Stay (HOSPITAL_BASED_OUTPATIENT_CLINIC_OR_DEPARTMENT_OTHER): Payer: Medicare Other

## 2020-10-16 DIAGNOSIS — S0003XA Contusion of scalp, initial encounter: Secondary | ICD-10-CM | POA: Diagnosis not present

## 2020-10-16 DIAGNOSIS — I6389 Other cerebral infarction: Secondary | ICD-10-CM | POA: Diagnosis not present

## 2020-10-16 DIAGNOSIS — G459 Transient cerebral ischemic attack, unspecified: Secondary | ICD-10-CM | POA: Diagnosis not present

## 2020-10-16 DIAGNOSIS — I4819 Other persistent atrial fibrillation: Secondary | ICD-10-CM

## 2020-10-16 DIAGNOSIS — Z950 Presence of cardiac pacemaker: Secondary | ICD-10-CM

## 2020-10-16 DIAGNOSIS — I63312 Cerebral infarction due to thrombosis of left middle cerebral artery: Secondary | ICD-10-CM | POA: Diagnosis not present

## 2020-10-16 LAB — IRON AND TIBC
Iron: 24 ug/dL — ABNORMAL LOW (ref 28–170)
Saturation Ratios: 8 % — ABNORMAL LOW (ref 10.4–31.8)
TIBC: 318 ug/dL (ref 250–450)
UIBC: 294 ug/dL

## 2020-10-16 LAB — LIPID PANEL
Cholesterol: 117 mg/dL (ref 0–200)
HDL: 44 mg/dL (ref >40)
LDL Cholesterol: 63 mg/dL (ref 0–99)
Total CHOL/HDL Ratio: 2.7 RATIO
Triglycerides: 49 mg/dL (ref <150)
VLDL: 10 mg/dL (ref 0–40)

## 2020-10-16 LAB — BASIC METABOLIC PANEL
Anion gap: 7 (ref 5–15)
BUN: 14 mg/dL (ref 8–23)
CO2: 28 mmol/L (ref 22–32)
Calcium: 8.6 mg/dL — ABNORMAL LOW (ref 8.9–10.3)
Chloride: 95 mmol/L — ABNORMAL LOW (ref 98–111)
Creatinine, Ser: 1.23 mg/dL — ABNORMAL HIGH (ref 0.44–1.00)
GFR, Estimated: 43 mL/min — ABNORMAL LOW (ref >60)
Glucose, Bld: 103 mg/dL — ABNORMAL HIGH (ref 70–99)
Potassium: 3.6 mmol/L (ref 3.5–5.1)
Sodium: 130 mmol/L — ABNORMAL LOW (ref 135–145)

## 2020-10-16 LAB — ECHOCARDIOGRAM COMPLETE
AR max vel: 0.62 cm2
AV Area VTI: 0.59 cm2
AV Area mean vel: 0.57 cm2
AV Mean grad: 32.6 mmHg
AV Peak grad: 52.9 mmHg
Ao pk vel: 3.64 m/s
Area-P 1/2: 3.31 cm2
Height: 62 in
MV M vel: 4.53 m/s
MV Peak grad: 82.1 mmHg
P 1/2 time: 418 msec
Radius: 0.9 cm
S' Lateral: 2.6 cm
Weight: 2292.78 oz

## 2020-10-16 MED ORDER — APIXABAN 5 MG PO TABS
5.0000 mg | ORAL_TABLET | Freq: Two times a day (BID) | ORAL | Status: DC
  Administered 2020-10-16: 5 mg via ORAL
  Filled 2020-10-16: qty 1

## 2020-10-16 MED ORDER — ASPIRIN 81 MG PO TBEC: 81.0000 mg | DELAYED_RELEASE_TABLET | Freq: Every day | ORAL | 11 refills | Status: AC

## 2020-10-16 MED ORDER — ASPIRIN EC 81 MG PO TBEC
81.0000 mg | DELAYED_RELEASE_TABLET | Freq: Every day | ORAL | Status: DC
  Administered 2020-10-16: 81 mg via ORAL
  Filled 2020-10-16: qty 1

## 2020-10-16 NOTE — Evaluation (Signed)
Clinical/Bedside Swallow Evaluation Patient Details  Name: [name] MRN: [id number] Date of Birth: 30-Jan-1931  Today's Date: 10/16/2020 Time: SLP Start Time (ACUTE ONLY): 0847 SLP Stop Time (ACUTE ONLY): 0900 SLP Time Calculation (min) (ACUTE ONLY): 13 min  Past Medical History:  Past Medical History:  Diagnosis Date   Chronic anticoagulation    Chronic atrial fibrillation ([place]) 01/28/2017   Chronic venous insufficiency 01/28/2017   Clavicle fracture 11/20/2017   Long term current use of anticoagulant therapy 01/28/2017   Osteoarthritis    Presence of permanent cardiac pacemaker 01/28/2017   Original implant 1992 [employer] 431-04 Generator change 2001 and 2011.  Medtronic generator.    Severe aortic stenosis    SSS (sick sinus syndrome) (HCC)    Thyroid disease    hypothyroidism   TIA (transient ischemic attack)    Past Surgical History:  Past Surgical History:  Procedure Laterality Date   ABDOMINAL HYSTERECTOMY     INSERT / REPLACE / REMOVE PACEMAKER     generator change 2011 medtronic sigma   KNEE SURGERY     LEAD INSERTION N/A 03/05/2019   Procedure: LEAD INSERTION - RV LEAD;  Surgeon: Deboraha Sprang, MD;  Location: [facility];  Service: Cardiovascular;  Laterality: N/A;   PACEMAKER IMPLANT N/A 03/05/2019   Procedure: PACEMAKER IMPLANT;  Surgeon: Deboraha Sprang, MD;  Location: [facility];  Service: Cardiovascular;  Laterality: N/A;   PACEMAKER INSERTION     PARS PLANA VITRECTOMY Right 07/19/2018   Procedure: VITRECTOMY WITH VITREOUS BIPOSY & ENDOLASER;  Surgeon: Jalene Mullet, MD;  Location: [facility];  Service: Ophthalmology;  Laterality: Right;   PPM GENERATOR CHANGEOUT N/A 02/02/2017   Procedure: PPM GENERATOR CHANGEOUT;  Surgeon: Deboraha Sprang, MD;  Location: [facility];  Service: Cardiovascular;  Laterality: N/A;   REPLACEMENT TOTAL KNEE BILATERAL     RIGHT/LEFT HEART CATH AND CORONARY ANGIOGRAPHY N/A 07/31/2020   Procedure:  RIGHT/LEFT HEART CATH AND CORONARY ANGIOGRAPHY;  Surgeon: Burnell Blanks, MD;  Location: [facility];  Service: Cardiovascular;  Laterality: N/A;   VARICOSE VEIN SURGERY     HPI:  87 y.o. female with PMH significant for atrial fibrillation on Eliquis, pacemaker, TIA, aortic stenosis,mild dementia and memory loss, cardiac pacemaker placement and  falls (most recent 7/8) who presented to the [facility] ED from her Cardiology office after she had sudden onset of slurred speech while being evaluated by the Cardiologist. CT negative for acute event; MRI contraindicated. Pt is undergoing TIA work-up.   Assessment / Plan / Recommendation Clinical Impression  Pt presents with functional swallow with adequate oral phase and mastication, the appearance of a brisk swallow response, cough on initial swallow of thin liquids but no subsequent coughing (six ounces water).  Recommend initiating a regular diet, thin liquids.  Give meds in water.  No SLP f/u for swallowing is needed. SLP Visit Diagnosis: Dysphagia, unspecified (R13.10)    Aspiration Risk  No limitations    Diet Recommendation   Regular diet thin liquids  Medication Administration: Whole meds with liquid    Other  Recommendations Oral Care Recommendations: Oral care BID   Follow up Recommendations None      Frequency and Duration            Prognosis        Swallow Study   General Date of Onset: 10/15/20 HPI: 87 y.o. female with PMH significant for atrial fibrillation on Eliquis, pacemaker, TIA, aortic stenosis,mild  dementia and memory loss, cardiac pacemaker placement and  falls (most recent 7/8) who presented to the [facility] ED from her Cardiology office after she had sudden onset of slurred speech while being evaluated by the Cardiologist. CT negative for acute event; MRI contraindicated. Pt is undergoing TIA work-up. Type of Study: Bedside Swallow Evaluation Previous Swallow Assessment: no Diet Prior to this Study:  NPO Temperature Spikes Noted: No Respiratory Status: Nasal cannula History of Recent Intubation: No Behavior/Cognition: Alert;Cooperative;Pleasant mood Oral Cavity Assessment: Within Functional Limits Oral Care Completed by SLP: No Oral Cavity - Dentition: Adequate natural dentition Vision: Functional for self-feeding Self-Feeding Abilities: Able to feed self Patient Positioning: Upright in chair Baseline Vocal Quality: Normal Volitional Cough: Strong Volitional Swallow: Able to elicit    Oral/Motor/Sensory Function Overall Oral Motor/Sensory Function: Within functional limits   Ice Chips Ice chips: Within functional limits   Thin Liquid Thin Liquid: Within functional limits    Nectar Thick Nectar Thick Liquid: Not tested   Honey Thick Honey Thick Liquid: Not tested   Puree Puree: Within functional limits   Solid     Solid: Within functional limits     [name] L. Tivis Ringer, [place] Office number [phone] Pager [phone]  [name] Laurice 10/16/2020,9:06 AM

## 2020-10-16 NOTE — Discharge Summary (Signed)
Physician Discharge Summary  [name] [id number] DOB: 26-Mar-1930 DOA: 10/15/2020  PCP: [name], MD  Admit date: 10/15/2020 Discharge date: 10/16/2020  Admitted From: home Discharge disposition: home   Recommendations for Outpatient Follow-Up:   Home health PT/OT/SLP   Discharge Diagnosis:   Active Problems:   TIA (transient ischemic attack)    Discharge Condition: Improved.  Diet recommendation: Low sodium, heart healthy.  Wound care: None.  Code status: Full.   History of Present Illness:   [name] is a 87 y.o. female with medical history significant of chronic A. Fib on Eliquis, SSS s/p PPM, severe aortic stenosis, HTN, mild dementia, presented with strokelike symptoms.   Patient was at thoracic surgery's office this morning alongside with her family to evaluate TAVR, when suddenly patient slumped to her left side, with right-sided facial droop and slurred speech.  Daughter at her side reported that could not understand what the patient was talking.  Patient remembered that she was sitting and suddenly started to feel " lightheaded and weird", "could not talk".  Daughter further reported that patient has had low blood pressure reading last week and her cardiology has cut down her lisinopril from 10 mg to 5 mg daily.  Since then, family has been checking her BP everyday and her BP remains to be on the lower side. This morning at home, her blood pressure 87/50.  And right after onset of her strokelike symptoms, staff at [facility]'s office check her blood pressure was 144 SBP. Her symptoms largely resolved in 2 hours. Right now, patient denied any LOC, numbness or weakness of any of the limbs, no chest pain or SOB.   At baseline, patient has had decompensated aortic stenosis recently with severe bilateral peripheral edema and worsening of exertional dyspnea.  Her cardiologist increased her Lasix from 20 mg daily to 20 mg twice daily for  3 days last week and leg swelling resolved but dyspnea symptoms remains the same.   Hospital Course by Problem:   TIA Neurology consult -unable to have MRI due to pacemaker wires -repeat head CT Negative Etiology for patient symptoms concerning for minor left brain stroke, however no cortical signs at this time, concerning for small vessel disease.  Patient compliant with Eliquis per daughter.  They would like to continue Eliquis.  We will add aspirin 81 on top of Eliquis.  Continue Lipitor on discharge.  Continue home Aricept and Zoloft.  PT/OT recommend home health PT/OT.  Patient has appointment with Dr. Mancel Bale on 10/29/2020.  Severe aortic stenosis -Long-term prognosis poor given the onset of CHF like symptoms, discussed with patient daughter at bedside, plan for TAVR when stable. -Hold off Lasix today.   CKD stage IIIa -stable  Chronic A. Fib -Continue Eliquis   Hypothyroidism -Continue Synthroid.    Medical Consultants:   neurology   Discharge Exam:   Vitals:   10/16/20 0825 10/16/20 1157  BP: 127/75 109/64  Pulse: 69 71  Resp: 16 18  Temp: 97.8 F (36.6 C) 97.8 F (36.6 C)  SpO2: 97% 92%   Vitals:   10/15/20 2303 10/16/20 0405 10/16/20 0825 10/16/20 1157  BP: 127/67 132/82 127/75 109/64  Pulse: 91 (!) 54 69 71  Resp: '15 15 16 18  '$ Temp: 98.2 F (36.8 C) 98.3 F (36.8 C) 97.8 F (36.6 C) 97.8 F (36.6 C)  TempSrc: Oral Oral Oral Oral  SpO2: 100% 100% 97% 92%  Weight: 65 kg  Height: '5\' 2"'$  (1.575 m)       General exam: Appears calm and comfortable.    The results of significant diagnostics from this hospitalization (including imaging, microbiology, ancillary and laboratory) are listed below for reference.     Procedures and Diagnostic Studies:   CT HEAD WO CONTRAST  Result Date: 10/16/2020 CLINICAL DATA:  Follow-up cerebral infarction. EXAM: CT HEAD WITHOUT CONTRAST TECHNIQUE: Contiguous axial images were obtained from the base of the skull  through the vertex without intravenous contrast. COMPARISON:  Earlier studies, same date. FINDINGS: Brain: Stable age related cerebral atrophy, ventriculomegaly and periventricular white matter disease. No extra-axial fluid collections are identified. No CT findings for acute hemispheric infarction or intracranial hemorrhage. No mass lesions. The brainstem and cerebellum are normal. Vascular: Stable vascular calcifications. No aneurysm or hyperdense vessels. Skull: No skull fracture or bone lesions. Sinuses/Orbits: The paranasal sinuses and mastoid air cells are clear. Other: No scalp lesions or scalp hematoma. IMPRESSION: 1. Stable age related cerebral atrophy, ventriculomegaly and periventricular white matter disease. 2. No acute intracranial findings. Electronically Signed   By: Marijo Sanes M.D.   On: 10/16/2020 12:32   ECHOCARDIOGRAM COMPLETE  Result Date: 10/16/2020    ECHOCARDIOGRAM REPORT   Patient Name:   [name] Date of Exam: 10/16/2020 Medical Rec #:  [id number]            Height:       62.0 in Accession #:    [id number]           Weight:       143.3 lb Date of Birth:  1931/03/02            BSA:          1.659 m Patient Age:    38 years             BP:           127/75 mmHg Patient Gender: F                    HR:           70 bpm. Exam Location:  Inpatient Procedure: 2D Echo, Cardiac Doppler and Color Doppler Indications:    TIA  History:        Patient has prior history of Echocardiogram examinations, most                 recent 07/11/2020. Pacemaker, Aortic Valve Disease and Mitral                 Valve Disease, Arrythmias:Atrial Fibrillation; Risk                 Factors:Hypertension.  Sonographer:    Clayton Lefort RDCS (AE) Referring Phys: [id number] [place]  1. There is severe eccentric anteriorly directed mitral regurgitation. Suspect flail P2 segment is present but not well visualized. There is systolic flow reversal in the R lower PV. Would recommend TEE for better  characterization. BP noted to be 127/75. This has worsened since the echocardiogram from 07/11/2020. The mitral valve is degenerative. Severe mitral valve regurgitation. No evidence of mitral stenosis.  2. Paradoxical severe low flow low gradient is present. Vmax 3.6 m/s, MG 33 mmHG, EOA 0.59 cm2, DI 0.21. SVI noted to be low, 29 cc/m2. The aortic valve is tricuspid. There is severe calcifcation of the aortic valve. There is severe thickening of the aortic valve. Aortic valve regurgitation is mild. Severe aortic valve  stenosis. Aortic valve area, by VTI measures 0.59 cm. Aortic valve mean gradient measures 32.6 mmHg. Aortic valve Vmax measures 3.64 m/s.  3. The tricuspid valve is abnormal. Tricuspid valve regurgitation is moderate to severe.  4. Left ventricular ejection fraction, by estimation, is 60 to 65%. The left ventricle has normal function. The left ventricle has no regional wall motion abnormalities. There is moderate concentric left ventricular hypertrophy. Left ventricular diastolic function could not be evaluated.  5. Right ventricular systolic function is mildly reduced. The right ventricular size is mildly enlarged. There is moderately elevated pulmonary artery systolic pressure. The estimated right ventricular systolic pressure is [id number] mmHg.  6. Left atrial size was severely dilated.  7. Right atrial size was severely dilated.  8. The inferior vena cava is dilated in size with <50% respiratory variability, suggesting right atrial pressure of 15 mmHg. Comparison(s): Changes from prior study are noted. Severe AS remains. MR is now severe. TR is now moderate to severe. RVSP similar to prior. FINDINGS  Left Ventricle: Left ventricular ejection fraction, by estimation, is 60 to 65%. The left ventricle has normal function. The left ventricle has no regional wall motion abnormalities. The left ventricular internal cavity size was normal in size. There is  moderate concentric left ventricular hypertrophy.  Left ventricular diastolic function could not be evaluated due to paced rhythm. Left ventricular diastolic function could not be evaluated. Right Ventricle: The right ventricular size is mildly enlarged. No increase in right ventricular wall thickness. Right ventricular systolic function is mildly reduced. There is moderately elevated pulmonary artery systolic pressure. The tricuspid regurgitant velocity is 2.94 m/s, and with an assumed right atrial pressure of 15 mmHg, the estimated right ventricular systolic pressure is [id number] mmHg. Left Atrium: Left atrial size was severely dilated. Right Atrium: Right atrial size was severely dilated. Pericardium: Trivial pericardial effusion is present. Mitral Valve: There is severe eccentric anteriorly directed mitral regurgitation. Suspect flail P2 segment is present but not well visualized. There is systolic flow reversal in the R lower PV. Would recommend TEE for better characterization. BP noted to  be 127/75. This has worsened since the echocardiogram from 07/11/2020. The mitral valve is degenerative in appearance. Severe mitral valve regurgitation. No evidence of mitral valve stenosis. Tricuspid Valve: The tricuspid valve is abnormal. Tricuspid valve regurgitation is moderate to severe. No evidence of tricuspid stenosis. Aortic Valve: Paradoxical severe low flow low gradient is present. Vmax 3.6 m/s, MG 33 mmHG, EOA 0.59 cm2, DI 0.21. SVI noted to be low, 29 cc/m2. The aortic valve is tricuspid. There is severe calcifcation of the aortic valve. There is severe thickening  of the aortic valve. Aortic valve regurgitation is mild. Aortic regurgitation PHT measures 418 msec. Severe aortic stenosis is present. Aortic valve mean gradient measures 32.6 mmHg. Aortic valve peak gradient measures 52.9 mmHg. Aortic valve area, by VTI measures 0.59 cm. Pulmonic Valve: The pulmonic valve was grossly normal. Pulmonic valve regurgitation is mild. No evidence of pulmonic stenosis. Aorta:  The aortic root and ascending aorta are structurally normal, with no evidence of dilitation. Venous: A pattern of systolic flow reversal, suggestive of severe mitral regurgitation is recorded from the right lower pulmonary vein. The inferior vena cava is dilated in size with less than 50% respiratory variability, suggesting right atrial pressure  of 15 mmHg. IAS/Shunts: The atrial septum is grossly normal. Additional Comments: A device lead is visualized in the right atrium.  LEFT VENTRICLE PLAX 2D LVIDd:  4.00 cm LVIDs:         2.60 cm LV PW:         1.40 cm LV IVS:        1.60 cm LVOT diam:     1.90 cm LV SV:         49 LV SV Index:   29 LVOT Area:     2.84 cm  RIGHT VENTRICLE            IVC RV Basal diam:  3.80 cm    IVC diam: 4.10 cm RV Mid diam:    2.70 cm RV S prime:     9.36 cm/s TAPSE (M-mode): 1.6 cm LEFT ATRIUM              Index       RIGHT ATRIUM           Index LA diam:        4.80 cm  2.89 cm/m  RA Area:     23.20 cm LA Vol (A2C):   111.0 ml 66.90 ml/m RA Volume:   72.60 ml  43.76 ml/m LA Vol (A4C):   149.0 ml 89.81 ml/m LA Biplane Vol: 128.0 ml 77.15 ml/m  AORTIC VALVE AV Area (Vmax):    0.62 cm AV Area (Vmean):   0.57 cm AV Area (VTI):     0.59 cm AV Vmax:           363.60 cm/s AV Vmean:          271.400 cm/s AV VTI:            0.823 m AV Peak Grad:      52.9 mmHg AV Mean Grad:      32.6 mmHg LVOT Vmax:         79.72 cm/s LVOT Vmean:        54.980 cm/s LVOT VTI:          0.172 m LVOT/AV VTI ratio: 0.21 AI PHT:            418 msec  AORTA Ao Root diam: 2.90 cm Ao Asc diam:  3.40 cm MITRAL VALVE                 TRICUSPID VALVE MV Area (PHT): 3.31 cm      TR Peak grad:   34.6 mmHg MV Decel Time: 229 msec      TR Vmax:        294.00 cm/s MR Peak grad:    82.1 mmHg MR Mean grad:    48.0 mmHg   SHUNTS MR Vmax:         453.00 cm/s Systemic VTI:  0.17 m MR Vmean:        318.0 cm/s  Systemic Diam: 1.90 cm MR PISA:         5.09 cm MR PISA Eff ROA: 35 mm MR PISA Radius:  0.90 cm MV E  velocity: 135.00 cm/s Eleonore Chiquito MD Electronically signed by Eleonore Chiquito MD Signature Date/Time: 10/16/2020/1:04:55 PM    Final    CT HEAD CODE STROKE WO CONTRAST  Result Date: 10/15/2020 CLINICAL DATA:  Neuro deficit, acute stroke suspected. EXAM: CT ANGIOGRAPHY HEAD AND NECK TECHNIQUE: Multidetector CT imaging of the head and neck was performed using the standard protocol during bolus administration of intravenous contrast. Multiplanar CT image reconstructions and MIPs were obtained to evaluate the vascular anatomy. Carotid stenosis measurements (when applicable) are obtained utilizing NASCET criteria, using the distal internal carotid  diameter as the denominator. CONTRAST:  32m OMNIPAQUE IOHEXOL 350 MG/ML SOLN COMPARISON:  CT head from 09/26/2020. FINDINGS: CT HEAD FINDINGS Brain: No evidence of acute large vascular territory infarction, hemorrhage, hydrocephalus, extra-axial collection or mass lesion/mass effect. Similar patchy white matter hypoattenuation, nonspecific but most likely related to chronic microvascular ischemic disease that is moderate. Also, moderate atrophy with ex vacuo ventricular dilation, similar to prior. ASPECTS 10. Vascular: See below. Skull: No acute fracture. Sinuses: Visualized sinuses are clear. Orbits: No acute orbital findings. 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 Right carotid system: There is predominately calcific atherosclerosis at the carotid bifurcation without greater than 50% narrowing. Left carotid system: There is predominant calcific atherosclerosis at the carotid bifurcation without greater than 50% narrowing. Vertebral arteries: Mildly left dominant. No evidence of significant (greater than 50%) stenosis. Skeleton: No evidence of acute abnormality.  Osteopenia.  Other neck: No acute abnormality. Upper chest: Emphysema. No consolidation visualized lung apices. Scattered ground-glass opacities, likely related to expiratory imaging. Review of the MIP images confirms the above findings CTA HEAD FINDINGS Anterior circulation: The intracranial ICA is are patent bilaterally. There is bilateral calcific atherosclerosis without evidence of greater than 50% stenosis. Bilateral MCAs and ACAs are patent without evidence of proximal hemodynamically significant stenosis. Posterior circulation: Left dominant vertebral vertebral artery. The intradural vertebral arteries, basilar artery, and posterior cerebral arteries are patent. Severe right P2 PCA stenosis. Bilateral posterior communicating arteries. Venous sinuses: As permitted by contrast timing, patent. Review of the MIP images confirms the above findings IMPRESSION: CT head: 1. No evidence of acute large vascular territory infarct or acute hemorrhage. ASPECTS 10. 2. Moderate atrophy and chronic microvascular ischemic disease. CTA Head: 1. No large vessel occlusion. 2. Severe right P2 PCA stenosis. CTA Neck: 1. Bilateral carotid bifurcation atherosclerosis without greater than 50% narrowing. 2. 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 Recommend annual imaging followup by CTA or MRA. This recommendation follows 2010 ACCF/AHA/AATS/ACR/ASA/SCA/SCAI/SIR/STS/SVM Guidelines for the Diagnosis and Management of Patients with Thoracic Aortic Disease. Circulation.2010; [id number] Aortic aneurysm NOS (ICD10-I71.9) 3. Dilation of the partially imaged main pulmonary artery up to 3.4 cm, which can be seen with pulmonary arterial hypertension. 4. Emphysema. Code stroke imaging results (inclduing major findings for CT head and CTA) were communicated on 10/15/2020 at 12:57 pm to provider JEvelena Peat(neurology NP) via telephone, who verbally acknowledged these results. Right PCA stenosis finding paged to Dr.  LCheral Markerat 1:24 PM via [facility]. Electronically Signed   By: FMargaretha SheffieldMD   On: 10/15/2020 13:28   CT ANGIO HEAD CODE STROKE  Result Date: 10/15/2020 CLINICAL DATA:  Neuro deficit, acute stroke suspected. EXAM: CT ANGIOGRAPHY HEAD AND NECK TECHNIQUE: Multidetector CT imaging of the head and neck was performed using the standard protocol during bolus administration of intravenous contrast. Multiplanar CT image reconstructions and MIPs were obtained to evaluate the vascular anatomy. Carotid stenosis measurements (when applicable) are obtained utilizing NASCET criteria, using the distal internal carotid diameter as the denominator. CONTRAST:  772mOMNIPAQUE IOHEXOL 350 MG/ML SOLN COMPARISON:  CT head from 09/26/2020. FINDINGS: CT HEAD FINDINGS Brain: No evidence of acute large vascular territory infarction, hemorrhage, hydrocephalus, extra-axial collection or mass lesion/mass effect. Similar patchy white matter hypoattenuation, nonspecific but most likely related to chronic microvascular  ischemic disease that is moderate. Also, moderate atrophy with ex vacuo ventricular dilation, similar to prior. ASPECTS 10. Vascular: See below. Skull: No acute fracture. Sinuses: Visualized sinuses are clear. Orbits: No acute orbital findings. 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 Right carotid system: There is predominately calcific atherosclerosis at the carotid bifurcation without greater than 50% narrowing. Left carotid system: There is predominant calcific atherosclerosis at the carotid bifurcation without greater than 50% narrowing. Vertebral arteries: Mildly left dominant. No evidence of significant (greater than 50%) stenosis. Skeleton: No evidence of acute abnormality.  Osteopenia. Other neck: No acute  abnormality. Upper chest: Emphysema. No consolidation visualized lung apices. Scattered ground-glass opacities, likely related to expiratory imaging. Review of the MIP images confirms the above findings CTA HEAD FINDINGS Anterior circulation: The intracranial ICA is are patent bilaterally. There is bilateral calcific atherosclerosis without evidence of greater than 50% stenosis. Bilateral MCAs and ACAs are patent without evidence of proximal hemodynamically significant stenosis. Posterior circulation: Left dominant vertebral vertebral artery. The intradural vertebral arteries, basilar artery, and posterior cerebral arteries are patent. Severe right P2 PCA stenosis. Bilateral posterior communicating arteries. Venous sinuses: As permitted by contrast timing, patent. Review of the MIP images confirms the above findings IMPRESSION: CT head: 1. No evidence of acute large vascular territory infarct or acute hemorrhage. ASPECTS 10. 2. Moderate atrophy and chronic microvascular ischemic disease. CTA Head: 1. No large vessel occlusion. 2. Severe right P2 PCA stenosis. CTA Neck: 1. Bilateral carotid bifurcation atherosclerosis without greater than 50% narrowing. 2. 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 Recommend annual imaging followup by CTA or MRA. This recommendation follows 2010 ACCF/AHA/AATS/ACR/ASA/SCA/SCAI/SIR/STS/SVM Guidelines for the Diagnosis and Management of Patients with Thoracic Aortic Disease. Circulation.2010; [id number]. Aortic aneurysm NOS (ICD10-I71.9) 3. Dilation of the partially imaged main pulmonary artery up to 3.4 cm, which can be seen with pulmonary arterial hypertension. 4. Emphysema. Code stroke imaging results (inclduing major findings for CT head and CTA) were communicated on 10/15/2020 at 12:57 pm to provider Evelena Peat (neurology NP) via telephone, who verbally acknowledged these results. Right PCA stenosis finding paged to Dr. Cheral Marker at 1:24 PM via  [facility] paging. Electronically Signed   By: Margaretha Sheffield MD   On: 10/15/2020 13:28   CT ANGIO NECK CODE STROKE  Result Date: 10/15/2020 CLINICAL DATA:  Neuro deficit, acute stroke suspected. EXAM: CT ANGIOGRAPHY HEAD AND NECK TECHNIQUE: Multidetector CT imaging of the head and neck was performed using the standard protocol during bolus administration of intravenous contrast. Multiplanar CT image reconstructions and MIPs were obtained to evaluate the vascular anatomy. Carotid stenosis measurements (when applicable) are obtained utilizing NASCET criteria, using the distal internal carotid diameter as the denominator. CONTRAST:  68m OMNIPAQUE IOHEXOL 350 MG/ML SOLN COMPARISON:  CT head from 09/26/2020. FINDINGS: CT HEAD FINDINGS Brain: No evidence of acute large vascular territory infarction, hemorrhage, hydrocephalus, extra-axial collection or mass lesion/mass effect. Similar patchy white matter hypoattenuation, nonspecific but most likely related to chronic microvascular ischemic disease that is moderate. Also, moderate atrophy with ex vacuo ventricular dilation, similar to prior. ASPECTS 10. Vascular: See below. Skull: No acute fracture. Sinuses: Visualized sinuses are clear. Orbits: No acute orbital findings. Review of the MIP images confirms the above findings CTA NECK FINDINGS Aortic arch: There is aneurysmal  dilation of the visualized aortic arch up to approximately 3.8 cm dilation of the partially imaged main pulmonary artery up to 3.4 cm. Right carotid system: There is predominately calcific atherosclerosis at the carotid bifurcation without greater than 50% narrowing. Left carotid system: There is predominant calcific atherosclerosis at the carotid bifurcation without greater than 50% narrowing. Vertebral arteries: Mildly left dominant. No evidence of significant (greater than 50%) stenosis. Skeleton: No evidence of acute abnormality.  Osteopenia. Other neck: No acute abnormality. Upper chest:  Emphysema. No consolidation visualized lung apices. Scattered ground-glass opacities, likely related to expiratory imaging. Review of the MIP images confirms the above findings CTA HEAD FINDINGS Anterior circulation: The intracranial ICA is are patent bilaterally. There is bilateral calcific atherosclerosis without evidence of greater than 50% stenosis. Bilateral MCAs and ACAs are patent without evidence of proximal hemodynamically significant stenosis. Posterior circulation: Left dominant vertebral vertebral artery. The intradural vertebral arteries, basilar artery, and posterior cerebral arteries are patent. Severe right P2 PCA stenosis. Bilateral posterior communicating arteries. Venous sinuses: As permitted by contrast timing, patent. Review of the MIP images confirms the above findings IMPRESSION: CT head: 1. No evidence of acute large vascular territory infarct or acute hemorrhage. ASPECTS 10. 2. Moderate atrophy and chronic microvascular ischemic disease. CTA Head: 1. No large vessel occlusion. 2. Severe right P2 PCA stenosis. CTA Neck: 1. Bilateral carotid bifurcation atherosclerosis without greater than 50% narrowing. 2. 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 Recommend annual imaging followup by CTA or MRA. This recommendation follows 2010 ACCF/AHA/AATS/ACR/ASA/SCA/SCAI/SIR/STS/SVM Guidelines for the Diagnosis and Management of Patients with Thoracic Aortic Disease. Circulation.2010; [id number]. Aortic aneurysm NOS (ICD10-I71.9) 3. Dilation of the partially imaged main pulmonary artery up to 3.4 cm, which can be seen with pulmonary arterial hypertension. 4. Emphysema. Code stroke imaging results (inclduing major findings for CT head and CTA) were communicated on 10/15/2020 at 12:57 pm to provider Evelena Peat (neurology NP) via telephone, who verbally acknowledged these results. Right PCA stenosis finding paged to Dr. Cheral Marker at 1:24 PM via [facility] paging. Electronically  Signed   By: Margaretha Sheffield MD   On: 10/15/2020 13:28     Labs:   Basic Metabolic Panel: Recent Labs  Lab 10/15/20 1229 10/15/20 1234 10/16/20 0311  NA 129* 129* 130*  K 3.9 3.9 3.6  CL 92* 92* 95*  CO2 27  --  28  GLUCOSE 109* 107* 103*  BUN '18 20 14  '$ CREATININE 1.32* 1.30* 1.23*  CALCIUM 8.9  --  8.6*   GFR Estimated Creatinine Clearance: 28.5 mL/min (A) (by C-G formula based on SCr of 1.23 mg/dL (H)). Liver Function Tests: Recent Labs  Lab 10/15/20 1229  AST 24  ALT 13  ALKPHOS 55  BILITOT 1.2  PROT 6.5  ALBUMIN 3.5   No results for input(s): LIPASE, AMYLASE in the last 168 hours. No results for input(s): AMMONIA in the last 168 hours. Coagulation profile Recent Labs  Lab 10/15/20 1229  INR 2.2*    CBC: Recent Labs  Lab 10/15/20 1229 10/15/20 1234  WBC 9.2  --   NEUTROABS 7.8*  --   HGB 10.6* 11.6*  HCT 33.9* 34.0*  MCV 85.0  --   PLT 222  --    Cardiac Enzymes: No results for input(s): CKTOTAL, CKMB, CKMBINDEX, TROPONINI in the last 168 hours. BNP: Invalid input(s): POCBNP CBG: Recent Labs  Lab 10/15/20 1228  GLUCAP 112*   D-Dimer No results for input(s): DDIMER in the last  72 hours. Hgb A1c No results for input(s): HGBA1C in the last 72 hours. Lipid Profile Recent Labs    10/16/20 0311  CHOL 117  HDL 44  LDLCALC 63  TRIG 49  CHOLHDL 2.7   Thyroid function studies No results for input(s): TSH, T4TOTAL, T3FREE, THYROIDAB in the last 72 hours.  Invalid input(s): FREET3 Anemia work up Recent Labs    10/15/20 1229 10/16/20 0311  TIBC  --  318  IRON  --  24*  RETICCTPCT 2.4  --    Microbiology Recent Results (from the past 240 hour(s))  Resp Panel by RT-PCR (Flu A&B, Covid) Nasopharyngeal Swab     Status: None   Collection Time: 10/15/20  1:48 PM   Specimen: Nasopharyngeal Swab; Nasopharyngeal(NP) swabs in vial transport medium  Result Value Ref Range Status   SARS Coronavirus 2 by RT PCR NEGATIVE NEGATIVE Final     Comment: (NOTE) SARS-CoV-2 target nucleic acids are NOT DETECTED.  The SARS-CoV-2 RNA is generally detectable in upper respiratory specimens during the acute phase of infection. The lowest concentration of SARS-CoV-2 viral copies this assay can detect is 138 copies/mL. A negative result does not preclude SARS-Cov-2 infection and should not be used as the sole basis for treatment or other patient management decisions. A negative result may occur with  improper specimen collection/handling, submission of specimen other than nasopharyngeal swab, presence of viral mutation(s) within the areas targeted by this assay, and inadequate number of viral copies(<138 copies/mL). A negative result must be combined with clinical observations, patient history, and epidemiological information. The expected result is Negative.  Fact Sheet for Patients:  EntrepreneurPulse.com.au  Fact Sheet for Healthcare Providers:  IncredibleEmployment.be  This test is no t yet approved or cleared by the [place] FDA and  has been authorized for detection and/or diagnosis of SARS-CoV-2 by FDA under an Emergency Use Authorization (EUA). This EUA will remain  in effect (meaning this test can be used) for the duration of the COVID-19 declaration under Section 564(b)(1) of the Act, 21 U.S.C.section 360bbb-3(b)(1), unless the authorization is terminated  or revoked sooner.       Influenza A by PCR NEGATIVE NEGATIVE Final   Influenza B by PCR NEGATIVE NEGATIVE Final    Comment: (NOTE) The Xpert Xpress SARS-CoV-2/FLU/RSV plus assay is intended as an aid in the diagnosis of influenza from Nasopharyngeal swab specimens and should not be used as a sole basis for treatment. Nasal washings and aspirates are unacceptable for Xpert Xpress SARS-CoV-2/FLU/RSV testing.  Fact Sheet for Patients: EntrepreneurPulse.com.au  Fact Sheet for Healthcare  Providers: IncredibleEmployment.be  This test is not yet approved or cleared by the [place] FDA and has been authorized for detection and/or diagnosis of SARS-CoV-2 by FDA under an Emergency Use Authorization (EUA). This EUA will remain in effect (meaning this test can be used) for the duration of the COVID-19 declaration under Section 564(b)(1) of the Act, 21 U.S.C. section 360bbb-3(b)(1), unless the authorization is terminated or revoked.  Performed at [facility], [employer] [address] [place], [place] [address]      Discharge Instructions:   Discharge Instructions     Diet general   Complete by: As directed    Discharge instructions   Complete by: As directed    Home health   Increase activity slowly   Complete by: As directed    No wound care   Complete by: As directed       Allergies as of 10/16/2020  Reactions   Adhesive [tape] Other (See Comments)   TAPE WILL TEAR THE SKIN!!!!   Penicillins Hives, Rash   Did it involve swelling of the face/tongue/throat, SOB, or low BP? No Did it involve sudden or severe rash/hives, skin peeling, or any reaction on the inside of your mouth or nose? Yes Did you need to seek medical attention at a hospital or doctor's office? Unknown  When did it last happen?      unknown  If all above answers are "NO", may proceed with cephalosporin use.        Medication List     TAKE these medications    acetaminophen 500 MG tablet Commonly known as: TYLENOL Take 1,000 mg by mouth every 8 (eight) hours as needed for moderate pain or headache.   aspirin 81 MG EC tablet Take 1 tablet (81 mg total) by mouth daily. Swallow whole.   atorvastatin 20 MG tablet Commonly known as: LIPITOR TAKE 1 TABLET BY MOUTH EVERY DAY IN THE EVENING   cetirizine 5 MG tablet Commonly known as: ZYRTEC TAKE 1 TABLET BY MOUTH EVERY DAY   D 2000 50 MCG (2000 UT) Tabs Generic drug: Cholecalciferol TAKE 1 TABLET BY MOUTH  EVERY DAY   donepezil 10 MG tablet Commonly known as: ARICEPT Take 1 tablet daily   Eliquis 5 MG Tabs tablet Generic drug: apixaban TAKE 1 TABLET BY MOUTH TWICE A DAY   fluticasone 50 MCG/ACT nasal spray Commonly known as: FLONASE PLACE 1 SPRAY INTO BOTH NOSTRILS DAILY AS NEEDED FOR ALLERGIES OR RHINITIS.   furosemide 20 MG tablet Commonly known as: LASIX TAKE 1 TABLET BY MOUTH EVERY DAY   GENTEAL OP Place 1 drop into both eyes daily as needed (dry eyes).   levothyroxine 88 MCG tablet Commonly known as: SYNTHROID TAKE 1 TABLET BY MOUTH EVERY DAY IN THE MORNING ON AN EMPTY STOMACH   lisinopril 5 MG tablet Commonly known as: ZESTRIL Take 1 tablet (5 mg total) by mouth daily.   oxybutynin 5 MG 24 hr tablet Commonly known as: DITROPAN-XL TAKE 1 TABLET BY MOUTH EVERY DAY   sertraline 50 MG tablet Commonly known as: Zoloft Take 1 tablet (50 mg total) by mouth at bedtime.        Follow-up Information     [name], MD Follow up in 1 week(s).   Specialty: Family Medicine Contact information: [place] [address] [phone]                  Time coordinating discharge: 35 min  Signed:  Geradine Girt DO  [facility] 10/16/2020, 1:58 PM

## 2020-10-16 NOTE — Evaluation (Signed)
Occupational Therapy Evaluation Patient Details Name: [name] MRN: [id number] DOB: May 02, 1930 Today's Date: 10/16/2020    History of Present Illness 87 y.o. female with medical history significant of chronic A. Fib on Eliquis, SSS s/p PPM, severe aortic stenosis, HTN, mild dementia, presented with strokelike symptoms.   Clinical Impression   Patient admitted for the diagnosis above.  PTA she walked with a RW, showered with supervision, and has HHA for the majority of the day and overnight for IADL, meal prep and community mobility.  Barriers are listed below.  Currently she is needing up to [facility] for basic bed mobility and transfers/toileting, and up to [place] a for lower body ADL.  It appears she could return home with prior supports and [facility] OT if she agrees.  OT will follow in the acute setting to maximize functional status.      Follow Up Recommendations  Home health OT    Equipment Recommendations  None recommended by OT    Recommendations for Other Services       Precautions / Restrictions Precautions Precautions: Fall Restrictions Weight Bearing Restrictions: No      Mobility Bed Mobility Overal bed mobility: Needs Assistance Bed Mobility: Supine to Sit     Supine to sit: Supervision       Patient Response: Cooperative  Transfers Overall transfer level: Needs assistance   Transfers: Sit to/from Stand Sit to Stand: Min guard              Balance Overall balance assessment: Needs assistance Sitting-balance support: Feet supported;No upper extremity supported Sitting balance-Leahy Scale: Good     Standing balance support: Bilateral upper extremity supported Standing balance-Leahy Scale: Poor Standing balance comment: needs a RW                           ADL either performed or assessed with clinical judgement   ADL Overall ADL's : Needs assistance/impaired Eating/Feeding: NPO   Grooming: Wash/dry hands;Standing;Min guard            Upper Body Dressing : Set up;Sitting   Lower Body Dressing: Minimal assistance;Sit to/from stand   Toilet Transfer: Tour manager Toilet;RW   Toileting- Water quality scientist and Hygiene: Supervision/safety;Sit to/from stand       Functional mobility during ADLs: Min guard;Rolling walker       Vision Patient Visual Report: Blurring of vision       Perception     Praxis      Pertinent Vitals/Pain Pain Assessment: No/denies pain     Hand Dominance Right   Extremity/Trunk Assessment Upper Extremity Assessment Upper Extremity Assessment: Generalized weakness   Lower Extremity Assessment Lower Extremity Assessment: Defer to PT evaluation   Cervical / Trunk Assessment Cervical / Trunk Assessment: Kyphotic   Communication Communication Communication: No difficulties   Cognition Arousal/Alertness: Awake/alert Behavior During Therapy: WFL for tasks assessed/performed Overall Cognitive Status: History of cognitive impairments - at baseline                                 General Comments: mild forgetfulness.  losses her train of thought at times.  Good safety noted.   General Comments                  Home Living Family/patient expects to be discharged to:: Private residence Living Arrangements: Alone Available Help at Discharge: Personal care attendant Type of  Home: House Home Access: Stairs to enter [employer] of Steps: 2 Entrance Stairs-Rails: Left Home Layout: One level     Bathroom Shower/Tub: Occupational psychologist: Standard     Home Equipment: Environmental consultant - 2 wheels;Cane - single point;Hand held shower head;Grab bars - tub/shower;Shower seat;Wheelchair - manual   Additional Comments: CG aide hours have been increased - someone is there in the moning now, and all night.  Patient states she is alone for approx 4 hours each day  Lives With: Alone (24 hour caregivers)    Prior  Functioning/Environment Level of Independence: Needs assistance  Gait / Transfers Assistance Needed: Mod I with RW ADL's / Homemaking Assistance Needed: Patient is supervision with showers.  Still helps with laundry, but HHA care for the bulk of IADL and meals.  HHA assist with community mobility.  Daughter lives near [place]. Communication / Swallowing Assistance Needed: mild slurring          OT Problem List: Decreased strength;Decreased activity tolerance;Impaired balance (sitting and/or standing)      OT Treatment/Interventions: Self-care/ADL training;Balance training;Cognitive remediation/compensation;Therapeutic activities    OT Goals(Current goals can be found in the care plan section) Acute Rehab OT Goals Patient Stated Goal: To get back home OT Goal Formulation: With patient Time For Goal Achievement: 10/30/20 Potential to Achieve Goals: Good ADL Goals Pt Will Perform Grooming: with modified independence;standing;sitting Pt Will Perform Lower Body Bathing: with modified independence;with supervision;sit to/from stand Pt Will Perform Lower Body Dressing: with supervision;sit to/from stand Pt Will Transfer to Toilet: with modified independence;ambulating;regular height toilet  OT Frequency: Min 2X/week   Barriers to D/C:    none noted       Co-evaluation              AM-PAC OT "6 Clicks" Daily Activity     Outcome Measure Help from another person eating meals?: None Help from another person taking care of personal grooming?: None Help from another person toileting, which includes using toliet, bedpan, or urinal?: A Little Help from another person bathing (including washing, rinsing, drying)?: A Little Help from another person to put on and taking off regular upper body clothing?: None Help from another person to put on and taking off regular lower body clothing?: A Little 6 Click Score: 21   End of Session Equipment Utilized During Treatment: Gait belt;Rolling  walker Nurse Communication: Mobility status  Activity Tolerance: Patient tolerated treatment well Patient left: in chair;with call bell/phone within reach;with chair alarm set  OT Visit Diagnosis: Unsteadiness on feet (R26.81);Muscle weakness (generalized) (M62.81);History of falling (Z91.81)                Time: [id number] OT Time Calculation (min): 35 min Charges:  OT General Charges $OT Visit: 1 Visit OT Evaluation $OT Eval Moderate Complexity: 1 Mod OT Treatments $Self Care/Home Management : 8-22 mins  10/16/2020  Rich, OTR/L  Acute Rehabilitation Services  Office:  [phone]   Metta Clines 10/16/2020, 9:30 AM

## 2020-10-16 NOTE — TOC Transition Note (Signed)
Transition of Care [facility]) - CM/SW Discharge Note   Patient Details  Name: [name] MRN: [id number] Date of Birth: 30-Oct-1930  [facility] CM/SW Contact:  Geralynn Ochs, LCSW Phone Number: 10/16/2020, 2:22 PM   Clinical Narrative:   CSW met with patient and daughter, [name], at bedside to discuss recommendation for home health. Daughter in agreement, no preference mentioned. CSW provided referral to [facility], they will follow up on scheduling. Patient already has walker at home and caregiver support. No other TOC needs at this time.    Final next level of care: [place] Barriers to Discharge: Barriers Resolved   Patient Goals and CMS Choice Patient states their goals for this hospitalization and ongoing recovery are:: patient unable to participate in goal setting, not fully oriented CMS Medicare.gov Compare Post Acute Care list provided to:: Patient Represenative (must comment) Choice offered to / list presented to : Adult Children  Discharge Placement                Patient to be transferred to facility by: Family car Name of family member notified: [name] Patient and family notified of of transfer: 10/16/20  Discharge Plan and Services                          HH Arranged: PT, OT, Speech Therapy [place]: [place] Date [place]: 10/16/20   Representative spoke with at [place]: [place] Interventions     Readmission Risk Interventions No flowsheet data found.

## 2020-10-16 NOTE — Progress Notes (Signed)
  Echocardiogram 2D Echocardiogram has been performed.  Marybelle Killings 10/16/2020, 12:00 PM

## 2020-10-16 NOTE — Progress Notes (Signed)
Pt wheeled off unit with all belongings.  

## 2020-10-16 NOTE — Progress Notes (Addendum)
STROKE TEAM PROGRESS NOTE   ATTENDING NOTE: I reviewed above note and agree with the assessment and plan. Pt was seen and examined.   87 year old female with history of A. fib on Eliquis, pacemaker, TIA, aortic stenosis, dementia, fall on 09/26/2020 admitted for slurred speech and right facial droop.  Per daughter at bedside, patient had a fall on 09/26/2020 hitting on the face, right elbow and right hip.  However, yesterday patient had acute onset of slurred speech and right facial droop and questionable left leaning.  Currently symptom improved, however still not fully resolved, still has slurred speech and right facial droop.  CT no acute abnormality.  CTA head and neck showed right P2 stenosis.  Noted to have MRI due to incompatible pacemaker leads.  Repeat CT no acute finding.  EF 60 to 65%, left atrial size severely dilated.  07/2020 carotid Doppler negative.  LDL 63, A1c pending.  UDS negative.  Sodium 129, creatinine 1.3, INR 2.2.  On exam, daughter at the bedside. Pt is eating lunch. Per daughter, pt still has subtle right facial droop and mild slurry speech.  Patient awake, alert, eyes open, orientated to age, place, time and people. No aphasia, fluent but slow language, following all simple commands. Able to name and repeat and read, mild dysarthria. No gaze palsy, tracking bilaterally, visual field full, PERRL. Slight right nasolabial fold flattening at rest, but no facial droop on facial movement. Tongue midline. Bilateral UEs 4/5, no drift. Bilaterally LEs 3/5, proximal and 4/5 distally. Sensation symmetrical bilaterally, b/l FTN grossly intact although slow, gait not tested.   Etiology for patient symptoms concerning for minor left brain stroke, however no cortical signs at this time, concerning for small vessel disease.  Patient compliant with Eliquis per daughter.  They would like to continue Eliquis.  We will add aspirin 81 on top of Eliquis.  Continue Lipitor on discharge.  Continue home  Aricept and Zoloft.  PT/OT recommend home health PT/OT.  Patient has appointment with Dr. Mancel Bale on 10/29/2020.  For detailed assessment and plan, please refer to above as I have made changes wherever appropriate.   Neurology will sign off. Please call with questions. Pt will follow up with Dr. Mancel Bale on 10/29/2020. Thanks for the consult.   Rosalin Hawking, MD PhD Stroke Neurology 10/16/2020 4:15 PM    INTERVAL HISTORY Her daughter is at the bedside.  Patient in bed resting comfortably. NAD.  Vitals:   10/15/20 2030 10/15/20 2230 10/15/20 2303 10/16/20 0405  BP: 121/76 123/76 127/67 132/82  Pulse: 80 82 91 (!) 54  Resp: (!) '23 13 15 15  '$ Temp: 99 F (37.2 C) 98.8 F (37.1 C) 98.2 F (36.8 C) 98.3 F (36.8 C)  TempSrc: Oral Oral Oral Oral  SpO2: 98% 96% 100% 100%  Weight:   65 kg   Height:   '5\' 2"'$  (1.575 m)    CBC:  Recent Labs  Lab 10/15/20 1229 10/15/20 1234  WBC 9.2  --   NEUTROABS 7.8*  --   HGB 10.6* 11.6*  HCT 33.9* 34.0*  MCV 85.0  --   PLT 222  --    Basic Metabolic Panel:  Recent Labs  Lab 10/15/20 1229 10/15/20 1234 10/16/20 0311  NA 129* 129* 130*  K 3.9 3.9 3.6  CL 92* 92* 95*  CO2 27  --  28  GLUCOSE 109* 107* 103*  BUN '18 20 14  '$ CREATININE 1.32* 1.30* 1.23*  CALCIUM 8.9  --  8.6*  Lipid Panel:  Recent Labs  Lab 10/16/20 0311  CHOL 117  TRIG 49  HDL 44  CHOLHDL 2.7  VLDL 10  LDLCALC 63   HgbA1c: pending Urine Drug Screen:  Recent Labs  Lab 10/15/20 1227  LABOPIA NONE DETECTED  COCAINSCRNUR NONE DETECTED  LABBENZ NONE DETECTED  AMPHETMU NONE DETECTED  THCU NONE DETECTED  LABBARB NONE DETECTED    Alcohol Level  Recent Labs  Lab 10/15/20 1229  ETH <10    IMAGING past 24 hours CT HEAD CODE STROKE WO CONTRAST  Result Date: 10/15/2020 CLINICAL DATA:  Neuro deficit, acute stroke suspected. EXAM: CT ANGIOGRAPHY HEAD AND NECK TECHNIQUE: Multidetector CT imaging of the head and neck was performed using the standard  protocol during bolus administration of intravenous contrast. Multiplanar CT image reconstructions and MIPs were obtained to evaluate the vascular anatomy. Carotid stenosis measurements (when applicable) are obtained utilizing NASCET criteria, using the distal internal carotid diameter as the denominator. CONTRAST:  24m OMNIPAQUE IOHEXOL 350 MG/ML SOLN COMPARISON:  CT head from 09/26/2020. FINDINGS: CT HEAD FINDINGS Brain: No evidence of acute large vascular territory infarction, hemorrhage, hydrocephalus, extra-axial collection or mass lesion/mass effect. Similar patchy white matter hypoattenuation, nonspecific but most likely related to chronic microvascular ischemic disease that is moderate. Also, moderate atrophy with ex vacuo ventricular dilation, similar to prior. ASPECTS 10. Vascular: See below. Skull: No acute fracture. Sinuses: Visualized sinuses are clear. Orbits: No acute orbital findings. 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 Right carotid system: There is predominately calcific atherosclerosis at the carotid bifurcation without greater than 50% narrowing. Left carotid system: There is predominant calcific atherosclerosis at the carotid bifurcation without greater than 50% narrowing. Vertebral arteries: Mildly left dominant. No evidence of significant (greater than 50%) stenosis. Skeleton: No evidence of acute abnormality.  Osteopenia. Other neck: No acute abnormality. Upper chest: Emphysema. No consolidation visualized lung apices. Scattered ground-glass opacities, likely related to expiratory imaging. Review of the MIP images confirms the above findings CTA HEAD FINDINGS Anterior circulation: The intracranial ICA is are patent bilaterally. There is bilateral calcific atherosclerosis without evidence of  greater than 50% stenosis. Bilateral MCAs and ACAs are patent without evidence of proximal hemodynamically significant stenosis. Posterior circulation: Left dominant vertebral vertebral artery. The intradural vertebral arteries, basilar artery, and posterior cerebral arteries are patent. Severe right P2 PCA stenosis. Bilateral posterior communicating arteries. Venous sinuses: As permitted by contrast timing, patent. Review of the MIP images confirms the above findings IMPRESSION: CT head: 1. No evidence of acute large vascular territory infarct or acute hemorrhage. ASPECTS 10. 2. Moderate atrophy and chronic microvascular ischemic disease. CTA Head: 1. No large vessel occlusion. 2. Severe right P2 PCA stenosis. CTA Neck: 1. Bilateral carotid bifurcation atherosclerosis without greater than 50% narrowing. 2. 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 Recommend annual imaging followup by CTA or MRA. This recommendation follows 2010 ACCF/AHA/AATS/ACR/ASA/SCA/SCAI/SIR/STS/SVM Guidelines for the Diagnosis and Management of Patients with Thoracic Aortic Disease. Circulation.2010; 121:[id number] Aortic aneurysm NOS (ICD10-I71.9) 3. Dilation of the partially imaged main pulmonary artery up to 3.4 cm, which can be seen with pulmonary arterial hypertension. 4. Emphysema. Code stroke imaging results (inclduing major findings for CT head and CTA) were communicated on 10/15/2020 at 12:57 pm to provider JEvelena Peat(neurology NP) via  telephone, who verbally acknowledged these results. Right PCA stenosis finding paged to Dr. Cheral Marker at 1:24 PM via [facility] paging. Electronically Signed   By: Margaretha Sheffield MD   On: 10/15/2020 13:28   CT ANGIO HEAD CODE STROKE  Result Date: 10/15/2020 CLINICAL DATA:  Neuro deficit, acute stroke suspected. EXAM: CT ANGIOGRAPHY HEAD AND NECK TECHNIQUE: Multidetector CT imaging of the head and neck was performed using the standard protocol during bolus  administration of intravenous contrast. Multiplanar CT image reconstructions and MIPs were obtained to evaluate the vascular anatomy. Carotid stenosis measurements (when applicable) are obtained utilizing NASCET criteria, using the distal internal carotid diameter as the denominator. CONTRAST:  88m OMNIPAQUE IOHEXOL 350 MG/ML SOLN COMPARISON:  CT head from 09/26/2020. FINDINGS: CT HEAD FINDINGS Brain: No evidence of acute large vascular territory infarction, hemorrhage, hydrocephalus, extra-axial collection or mass lesion/mass effect. Similar patchy white matter hypoattenuation, nonspecific but most likely related to chronic microvascular ischemic disease that is moderate. Also, moderate atrophy with ex vacuo ventricular dilation, similar to prior. ASPECTS 10. Vascular: See below. Skull: No acute fracture. Sinuses: Visualized sinuses are clear. Orbits: No acute orbital findings. 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 Right carotid system: There is predominately calcific atherosclerosis at the carotid bifurcation without greater than 50% narrowing. Left carotid system: There is predominant calcific atherosclerosis at the carotid bifurcation without greater than 50% narrowing. Vertebral arteries: Mildly left dominant. No evidence of significant (greater than 50%) stenosis. Skeleton: No evidence of acute abnormality.  Osteopenia. Other neck: No acute abnormality. Upper chest: Emphysema. No consolidation visualized lung apices. Scattered ground-glass opacities, likely related to expiratory imaging. Review of the MIP images confirms the above findings CTA HEAD FINDINGS Anterior circulation: The intracranial ICA is are patent bilaterally. There is bilateral calcific atherosclerosis without evidence of greater than 50%  stenosis. Bilateral MCAs and ACAs are patent without evidence of proximal hemodynamically significant stenosis. Posterior circulation: Left dominant vertebral vertebral artery. The intradural vertebral arteries, basilar artery, and posterior cerebral arteries are patent. Severe right P2 PCA stenosis. Bilateral posterior communicating arteries. Venous sinuses: As permitted by contrast timing, patent. Review of the MIP images confirms the above findings IMPRESSION: CT head: 1. No evidence of acute large vascular territory infarct or acute hemorrhage. ASPECTS 10. 2. Moderate atrophy and chronic microvascular ischemic disease. CTA Head: 1. No large vessel occlusion. 2. Severe right P2 PCA stenosis. CTA Neck: 1. Bilateral carotid bifurcation atherosclerosis without greater than 50% narrowing. 2. 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 Recommend annual imaging followup by CTA or MRA. This recommendation follows 2010 ACCF/AHA/AATS/ACR/ASA/SCA/SCAI/SIR/STS/SVM Guidelines for the Diagnosis and Management of Patients with Thoracic Aortic Disease. Circulation.2010; 121:[id number] Aortic aneurysm NOS (ICD10-I71.9) 3. Dilation of the partially imaged main pulmonary artery up to 3.4 cm, which can be seen with pulmonary arterial hypertension. 4. Emphysema. Code stroke imaging results (inclduing major findings for CT head and CTA) were communicated on 10/15/2020 at 12:57 pm to provider JEvelena Peat(neurology NP) via telephone, who verbally acknowledged these results. Right PCA stenosis finding paged to Dr. LCheral Markerat 1:24 PM via [facility]. Electronically Signed   By: FMargaretha SheffieldMD   On: 10/15/2020 13:28   CT ANGIO NECK CODE STROKE  Result Date: 10/15/2020 CLINICAL DATA:  Neuro deficit, acute stroke  suspected. EXAM: CT ANGIOGRAPHY HEAD AND NECK TECHNIQUE: Multidetector CT imaging of the head and neck was performed using the standard protocol during bolus administration of intravenous  contrast. Multiplanar CT image reconstructions and MIPs were obtained to evaluate the vascular anatomy. Carotid stenosis measurements (when applicable) are obtained utilizing NASCET criteria, using the distal internal carotid diameter as the denominator. CONTRAST:  72m OMNIPAQUE IOHEXOL 350 MG/ML SOLN COMPARISON:  CT head from 09/26/2020. FINDINGS: CT HEAD FINDINGS Brain: No evidence of acute large vascular territory infarction, hemorrhage, hydrocephalus, extra-axial collection or mass lesion/mass effect. Similar patchy white matter hypoattenuation, nonspecific but most likely related to chronic microvascular ischemic disease that is moderate. Also, moderate atrophy with ex vacuo ventricular dilation, similar to prior. ASPECTS 10. Vascular: See below. Skull: No acute fracture. Sinuses: Visualized sinuses are clear. Orbits: No acute orbital findings. 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 Right carotid system: There is predominately calcific atherosclerosis at the carotid bifurcation without greater than 50% narrowing. Left carotid system: There is predominant calcific atherosclerosis at the carotid bifurcation without greater than 50% narrowing. Vertebral arteries: Mildly left dominant. No evidence of significant (greater than 50%) stenosis. Skeleton: No evidence of acute abnormality.  Osteopenia. Other neck: No acute abnormality. Upper chest: Emphysema. No consolidation visualized lung apices. Scattered ground-glass opacities, likely related to expiratory imaging. Review of the MIP images confirms the above findings CTA HEAD FINDINGS Anterior circulation: The intracranial ICA is are patent bilaterally. There is bilateral calcific atherosclerosis without evidence of greater than 50% stenosis. Bilateral MCAs and ACAs  are patent without evidence of proximal hemodynamically significant stenosis. Posterior circulation: Left dominant vertebral vertebral artery. The intradural vertebral arteries, basilar artery, and posterior cerebral arteries are patent. Severe right P2 PCA stenosis. Bilateral posterior communicating arteries. Venous sinuses: As permitted by contrast timing, patent. Review of the MIP images confirms the above findings IMPRESSION: CT head: 1. No evidence of acute large vascular territory infarct or acute hemorrhage. ASPECTS 10. 2. Moderate atrophy and chronic microvascular ischemic disease. CTA Head: 1. No large vessel occlusion. 2. Severe right P2 PCA stenosis. CTA Neck: 1. Bilateral carotid bifurcation atherosclerosis without greater than 50% narrowing. 2. 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 Recommend annual imaging followup by CTA or MRA. This recommendation follows 2010 ACCF/AHA/AATS/ACR/ASA/SCA/SCAI/SIR/STS/SVM Guidelines for the Diagnosis and Management of Patients with Thoracic Aortic Disease. Circulation.2010; 121:[id number] Aortic aneurysm NOS (ICD10-I71.9) 3. Dilation of the partially imaged main pulmonary artery up to 3.4 cm, which can be seen with pulmonary arterial hypertension. 4. Emphysema. Code stroke imaging results (inclduing major findings for CT head and CTA) were communicated on 10/15/2020 at 12:57 pm to provider JEvelena Peat(neurology NP) via telephone, who verbally acknowledged these results. Right PCA stenosis finding paged to Dr. LCheral Markerat 1:24 PM via [facility]aging. Electronically Signed   By: FMargaretha SheffieldMD   On: 10/15/2020 13:28    PHYSICAL EXAM Physical Exam  Constitutional: Appears well-developed and well-nourished.  Psych: Affect appropriate to situation Eyes: Normal external eye and conjunctiva. HENT: Normocephalic, no lesions, without obvious abnormality.   Musculoskeletal- multiple scattered bruises and abrasions Cardiovascular:  Normal rate and regular rhythm.  Respiratory: Effort normal, non-labored breathing saturations WNL GI: Soft.  No distension. There is no tenderness.  [facility]  Neurological  Examination Mental Status: Alert, oriented, thought content appropriate.  Speech fluent without evidence of aphasia.  Able to follow 3 step commands without difficulty. Cranial Nerves: II:  Visual fields grossly normal,  III,IV, VI: ptosis not present, extra-ocular motions intact bilaterally, pupils equal, round, reactive to light and accommodation V,VII: smile symmetric, facial light touch sensation normal bilaterally VIII: hearing normal bilaterally IX,X: uvula rises midline XI: bilateral shoulder shrug XII: midline tongue extension Motor: Right : Upper extremity   4/5  Left:     Upper extremity   4/5 distal Lower extremity   4/5   Lower extremity   4/5 Proximal     RLE 3/5    LLE: 3/5 Tone and bulk:normal tone throughout; no atrophy noted Sensory: light touch intact throughout, bilaterally Cerebellar: normal finger-to-nose,  Gait: deferred  ASSESSMENT/PLAN [name] 87 year old female with PMH significant for atrial fibrillation on Eliquis, pacemaker, TIA, aortic stenosis,mild dementia and memory loss, cardiac pacemaker placement and  falls who presented to the [facility] ED from her Cardiology office after she had sudden onset of slurred speech while being evaluated by the Cardiologist.    Patient was at her [place] office for valve replacement work-up. Per Dr. Cyndia Bent he had been talking to the patient and daughter for about 45 minutes. Her speech became slurred and she was leaning to the left side.   She also stated hat she did not feel well. EMS was called for possible stroke. Of note patient had a fall 09/26/20 and was seen in ED. Per report patient has had increasing falls lately with the last fall occurring 7/8. She also receives 24 hour nursing care in her home.  Likely left brain small  minor stroke, likely due to small vessel disease  Code Stroke CT head 1. No acute intracranial findings. 2. Small scalp hematoma overlies the superior right frontal bone. No underlying calvarial fracture. 3. Chronic microvascular ischemic change and cerebral volume loss. CTA head 1. No large vessel occlusion.2. Severe right P2 PCA stenosis.  CTA neck 1. Bilateral carotid bifurcation atherosclerosis without greater than 50% narrowing. 2. 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 Recommend annual imaging follow up by CTA or MRA 3. Dilation of the partially imaged main pulmonary artery up to 3.4cm, which can be seen with pulmonary arterial hypertension. 4. Emphysema.  MRI  can not d/t pacemaker Repeat Head CT:1. Stable age related cerebral atrophy, ventriculomegaly and periventricular white matter disease. 2. No acute intracranial findings. 2D Echo pending LDL 63 HgbA1c 6.1 VTE prophylaxis - eliquis    Diet   Diet NPO time specified   Eliquis (apixaban) daily prior to admission, now on aspirin 81 mg daily and Eliquis (apixaban) daily.  Therapy recommendations:  SLP: [facility] SLP; none per OT Disposition:  pending  Hypertension Home meds:  lisinopril Stable Permissive hypertension (OK if < 220/120) but gradually normalize in 5-7 days Long-term BP goal normotensive  Hyperlipidemia Home meds:  atorvastatin 20, resumed in hospital LDL 63, goal < 70 Continue statin at discharge  Diabetes type II no diagnosis Home meds:  none HgbA1c 6.1, goal < 7.0 CBGs  Other Stroke Risk Factors Advanced Age >/= 56  Hx TIA  Other Active Problems Increased falls: [facility] PT/OT  Hospital day # 0  Laurey Morale, MSN, NP-C [facility] [phone]   To contact Stroke Continuity provider, please refer to [email or web address] After hours, contact General Neurology

## 2020-10-16 NOTE — Evaluation (Signed)
Speech Language Pathology Evaluation Patient Details Name: KIERSTYNN JOE MRN: [id number] DOB: [birth year] Today's Date: 10/16/2020 Time: 0900-0930 SLP Time Calculation (min) (ACUTE ONLY): 30 min  Problem List:  Patient Active Problem List   Diagnosis Date Noted   Slurred speech    Left-sided weakness    Abrasion of right elbow 10/06/2020   Osteoporosis 09/29/2020   Moderate dementia with behavioral disturbance (HCC) 09/04/2020   Severe aortic stenosis    Aortic stenosis 07/15/2020   CHF (congestive heart failure) ([place]) 06/05/2020   Left sided abdominal pain 05/13/2020   Sinus congestion 12/18/2019   Dementia without behavioral disturbance ([place]) 07/31/2019   Sick sinus syndrome ([place]) 07/28/2019   Epigastric pain 06/12/2019   Localized swelling of left lower leg 04/11/2019   Memory loss 04/11/2019   Prediabetes 03/14/2019   Complete heart block ([place]) 03/05/2019   TIA (transient ischemic attack) 01/15/2019   Gait abnormality 01/15/2019   Transient ischemia 12/13/2018   Dysarthria 12/11/2018   Hypothyroidism 12/11/2018   Arthritis of right hand 08/17/2018   Chronic back pain 04/11/2018   Encounter for therapeutic drug monitoring 03/09/2018   Hypertension 01/04/2018   Clavicle fracture 11/20/2017   Closed fracture of clavicle 11/20/2017   Fall    Accelerated hypertension    Carpal tunnel syndrome of left wrist 08/02/2017   Carpal tunnel syndrome of right wrist 08/02/2017   Bradycardia 02/02/2017   Presence of permanent cardiac pacemaker 01/28/2017   Chronic a-fib ([place]) 01/28/2017   Long term current use of anticoagulant therapy 01/28/2017   Chronic venous insufficiency 01/28/2017   Chronic atrial fibrillation ([place]) 01/28/2017   Peripheral venous insufficiency 01/28/2017   Cardiac pacemaker in situ 01/28/2017   Anticoagulated 01/28/2017   DOE (dyspnea on exertion) 09/22/2015   Dyspnea on exertion 09/22/2015   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 05/15/2014    After cataract of left eye not obscuring vision 05/15/2014   Bilateral dry eyes 05/15/2014   Dermatochalasis of eyelid 05/15/2014   Hyperopia of both eyes with astigmatism and presbyopia 05/15/2014   Irregular astigmatism of right eye 05/15/2014   Mechanical complication due to intraocular lens implant 05/15/2014   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  Medtronic generator.    Severe aortic stenosis    SSS (sick sinus syndrome) (HCC)    Thyroid disease    hypothyroidism   TIA (transient ischemic attack)    Past Surgical History:  Past Surgical History:  Procedure Laterality Date   ABDOMINAL HYSTERECTOMY     INSERT / REPLACE / REMOVE PACEMAKER     generator change 2011 medtronic sigma   KNEE SURGERY     LEAD INSERTION N/A 03/05/2019   Procedure: LEAD INSERTION - RV LEAD;  Surgeon: Deboraha Sprang, MD;  Location: [facility];  Service: Cardiovascular;  Laterality: N/A;   PACEMAKER IMPLANT N/A 03/05/2019   Procedure: PACEMAKER IMPLANT;  Surgeon: Deboraha Sprang, MD;  Location: [facility];  Service: Cardiovascular;  Laterality: N/A;   PACEMAKER INSERTION     PARS PLANA VITRECTOMY Right 07/19/2018  Procedure: VITRECTOMY WITH VITREOUS BIPOSY & ENDOLASER;  Surgeon: Jalene Mullet, MD;  Location: [facility];  Service: Ophthalmology;  Laterality: Right;   PPM GENERATOR CHANGEOUT N/A 02/02/2017   Procedure: PPM GENERATOR CHANGEOUT;  Surgeon: Deboraha Sprang, MD;  Location: [facility];  Service: Cardiovascular;   Laterality: N/A;   REPLACEMENT TOTAL KNEE BILATERAL     RIGHT/LEFT HEART CATH AND CORONARY ANGIOGRAPHY N/A 07/31/2020   Procedure: RIGHT/LEFT HEART CATH AND CORONARY ANGIOGRAPHY;  Surgeon: Burnell Blanks, MD;  Location: [facility];  Service: Cardiovascular;  Laterality: N/A;   VARICOSE VEIN SURGERY     HPI:  87 y.o. female with PMH significant for atrial fibrillation on Eliquis, pacemaker, TIA, aortic stenosis,mild dementia and memory loss, cardiac pacemaker placement and  falls (most recent 7/8) who presented to the [facility] ED from her Cardiology office after she had sudden onset of slurred speech while being evaluated by the Cardiologist. CT negative for acute event; MRI contraindicated. Pt is undergoing TIA work-up.   Assessment / Plan / Recommendation Clinical Impression  Pt presents with baseline mild memory deficits, now with word-retrieval deficits at conversational levels (mild dysnomia); basic language skills intact with regard to naming, following multistep commands, yes/no reliability.  Deficits in awareness.  Recommend acute SLP services while admitted; may benefit from f/u after discharge if no improvement next 24-48 hours. SLP will follow.    SLP Assessment  SLP Recommendation/Assessment: Patient needs continued Speech Lanaguage Pathology Services SLP Visit Diagnosis: Dysphagia, unspecified (R13.10)    Follow Up Recommendations  HH SLP    Frequency and Duration min 2x/week  1 week      SLP Evaluation Cognition  Overall Cognitive Status: History of cognitive impairments - at baseline Arousal/Alertness: Awake/alert Orientation Level: Oriented to person;Oriented to place;Disoriented to time Attention: Sustained Sustained Attention: Appears intact Awareness: Impaired       Comprehension  Auditory Comprehension Overall Auditory Comprehension: Appears within functional limits for tasks assessed Yes/No Questions: Within Functional Limits Commands: Within  Functional Limits Visual Recognition/Discrimination Discrimination: Within Function Limits Reading Comprehension Reading Status: Within funtional limits    Expression Expression Primary Mode of Expression: Verbal Verbal Expression Overall Verbal Expression: Impaired Initiation: No impairment Level of Generative/Spontaneous Verbalization: Conversation Repetition: No impairment Naming: Impairment Responsive: 76-100% accurate Confrontation: Within functional limits Divergent: 25-49% accurate Pragmatics: No impairment Written Expression Dominant Hand: Right   Oral / Motor  Oral Motor/Sensory Function Overall Oral Motor/Sensory Function: Mild impairment Facial Symmetry: Abnormal symmetry right Motor Speech Overall Motor Speech: Appears within functional limits for tasks assessed   GO                   Samarra Ridgely L. Tivis Ringer, [place] Office number [phone] Pager [phone]  Assunta Curtis 10/16/2020, 9:39 AM

## 2020-10-16 NOTE — Plan of Care (Signed)
  Problem: Elimination: Goal: Will not experience complications related to urinary retention Outcome: Progressing   Problem: Pain Managment: Goal: General experience of comfort will improve Outcome: Progressing   Problem: Safety: Goal: Ability to remain free from injury will improve Outcome: Progressing   Problem: Self-Care: Goal: Verbalization of feelings and concerns over difficulty with self-care will improve Outcome: Progressing Goal: Ability to communicate needs accurately will improve Outcome: Progressing

## 2020-10-16 NOTE — Evaluation (Signed)
Physical Therapy Evaluation Patient Details Name: [name] MRN: [id number] DOB: 1931/02/08 Today's Date: 10/16/2020   History of Present Illness  87 y.o. female admitted 7/27 secondary to aphasia and dysphagia. CT negative for acute abnormality. Unable to get MRI. Past medical history significant of chronic A. Fib on Eliquis, SSS s/p PPM, severe aortic stenosis, HTN, mild dementia, presented with strokelike symptoms.  Clinical Impression  Pt admitted secondary to problem above with deficits below. Requiring min to min guard A for mobility tasks this session using RW. Session limited secondary to fatigue. Per daughter, reports pt has aides that come in to assist throughout the day and has also been working with [facility]. Recommend resumption of HHPT at d/c. Will continue to follow acutely.     Follow Up Recommendations Home health PT;Supervision/Assistance - 24 hour    Equipment Recommendations  None recommended by PT    Recommendations for Other Services       Precautions / Restrictions Precautions Precautions: Fall Restrictions Weight Bearing Restrictions: No      Mobility  Bed Mobility Overal bed mobility: Needs Assistance Bed Mobility: Supine to Sit;Sit to Supine     Supine to sit: Min assist Sit to supine: Supervision   General bed mobility comments: Min A for trunk elevation.    Transfers Overall transfer level: Needs assistance Equipment used: Rolling walker (2 wheeled) Transfers: Sit to/from Stand Sit to Stand: Min guard         General transfer comment: Min guard for safety. Cues for hand placement.  Ambulation/Gait Ambulation/Gait assistance: Min guard Gait Distance (Feet): 30 Feet Assistive device: Rolling walker (2 wheeled) Gait Pattern/deviations: Step-through pattern;Decreased stride length Gait velocity: Decreased   General Gait Details: Min guard for safety. Cues for safety with use of RW. Pt fatigued so mobility limited to bathroom and  back.  Stairs            Wheelchair Mobility    Modified Rankin (Stroke Patients Only)       Balance Overall balance assessment: Needs assistance Sitting-balance support: No upper extremity supported;Feet supported Sitting balance-Leahy Scale: Good     Standing balance support: Bilateral upper extremity supported;During functional activity Standing balance-Leahy Scale: Poor Standing balance comment: Reliant on BUE support                             Pertinent Vitals/Pain Pain Assessment: No/denies pain    Home Living Family/patient expects to be discharged to:: Private residence Living Arrangements: Alone Available Help at Discharge: Personal care attendant;Available 24 hours/day Type of Home: House Home Access: Stairs to enter Entrance Stairs-Rails: Left Entrance Stairs-Number of Steps: 2 Home Layout: One level Home Equipment: Walker - 2 wheels;Cane - single point;Hand held shower head;Grab bars - tub/shower;Shower seat;Wheelchair - manual Additional Comments: [facility] aide hours have been increased - someone is there in the moning now, and all night.  Patient states she is alone for approx 4 hours each day    Prior Function Level of Independence: Needs assistance   Gait / Transfers Assistance Needed: Mod I with RW  ADL's / Homemaking Assistance Needed: Patient is supervision with showers.  Still helps with laundry, but HHA care for the bulk of IADL and meals.  HHA assist with community mobility.  Daughter lives near [place].        Hand Dominance        Extremity/Trunk Assessment   Upper Extremity Assessment Upper Extremity Assessment: Defer to  OT evaluation    Lower Extremity Assessment Lower Extremity Assessment: Generalized weakness    Cervical / Trunk Assessment Cervical / Trunk Assessment: Kyphotic  Communication   Communication: No difficulties  Cognition Arousal/Alertness: Awake/alert Behavior During Therapy: WFL for tasks  assessed/performed Overall Cognitive Status: History of cognitive impairments - at baseline                                 General Comments: Dementia at baseline      General Comments      Exercises     Assessment/Plan    PT Assessment Patient needs continued PT services  PT Problem List Decreased strength;Decreased activity tolerance;Decreased balance;Decreased mobility;Decreased cognition;Decreased knowledge of use of DME;Decreased safety awareness;Decreased knowledge of precautions       PT Treatment Interventions DME instruction;Gait training;Stair training;Functional mobility training;Therapeutic activities;Therapeutic exercise;Balance training;Patient/family education    PT Goals (Current goals can be found in the Care Plan section)  Acute Rehab PT Goals Patient Stated Goal: To get back home PT Goal Formulation: With patient Time For Goal Achievement: 10/30/20 Potential to Achieve Goals: Good    Frequency Min 3X/week   Barriers to discharge        Co-evaluation               AM-PAC PT "6 Clicks" Mobility  Outcome Measure Help needed turning from your back to your side while in a flat bed without using bedrails?: A Little Help needed moving from lying on your back to sitting on the side of a flat bed without using bedrails?: A Little Help needed moving to and from a bed to a chair (including a wheelchair)?: A Little Help needed standing up from a chair using your arms (e.g., wheelchair or bedside chair)?: A Little Help needed to walk in hospital room?: A Little Help needed climbing 3-5 steps with a railing? : A Lot 6 Click Score: 17    End of Session Equipment Utilized During Treatment: Gait belt Activity Tolerance: Patient limited by fatigue Patient left: in bed;with call bell/phone within reach;with bed alarm set Nurse Communication: Mobility status PT Visit Diagnosis: Unsteadiness on feet (R26.81);Muscle weakness (generalized)  (M62.81)    Time: [id number] PT Time Calculation (min) (ACUTE ONLY): 24 min   Charges:   PT Evaluation $PT Eval Low Complexity: 1 Low PT Treatments $Gait Training: 8-22 mins        [name], DPT  Acute Rehabilitation Services  Pager: [phone] Office: [phone]   [name] 10/16/2020, 3:47 PM

## 2020-10-17 LAB — HEMOGLOBIN A1C
Hgb A1c MFr Bld: 5.8 % — ABNORMAL HIGH (ref 4.8–5.6)
Mean Plasma Glucose: 120 mg/dL

## 2020-10-22 ENCOUNTER — Telehealth: Payer: Self-pay | Admitting: Family Medicine

## 2020-10-22 NOTE — Telephone Encounter (Signed)
Please call pt to schedule hospital f/u

## 2020-10-23 ENCOUNTER — Encounter: Payer: Self-pay | Admitting: Family Medicine

## 2020-10-23 NOTE — Telephone Encounter (Signed)
Called pt to schedule appt. Pt did not answer so I left a voicemail. 

## 2020-10-26 ENCOUNTER — Other Ambulatory Visit: Payer: Self-pay | Admitting: Family Medicine

## 2020-10-26 DIAGNOSIS — R0981 Nasal congestion: Secondary | ICD-10-CM

## 2020-10-27 NOTE — Telephone Encounter (Signed)
[facility] - Client TELEPHONE ADVICE RECORD [facility] Patient Name: [name] Gender: Female DOB: Aug 08, 1930 Age: 87 Y 11 M 23 D Return Phone Number: [phone] (Primary), [phone] (Secondary) Address: [place] [address] Client [facility] - Client Client Site [place] - Day Physician Waunita Schooner- MD Contact Type Call Who Is Calling Patient / Member / Family / Caregiver Call Type Triage / Clinical Caller Name Stanton Kidney Relationship To Patient Care [place] Return Phone Number [phone] (Secondary) Chief Complaint Dehydration (greater than 70 year old) Reason for Call Symptomatic / Request for [place] states patient is experiencing a uti with dehydration. Translation No Nurse Assessment Nurse: Clovis Riley, RN, Georgina Peer Date/Time (Eastern Time): 10/27/2020 9:21:45 AM Confirm and document reason for call. If symptomatic, describe symptoms. ---Caller states her mother had a minor stroke on 7/29 and was told she needed physical, speech and occupational therapy at home. She is having weakness and sleeping alot. Urine is dark and foul smelling. She is trying to drink water. Does the patient have any new or worsening symptoms? ---Yes Will a triage be completed? ---Yes Related visit to physician within the last 2 weeks? ---Yes Does the PT have any chronic conditions? (i.e. diabetes, asthma, this includes High risk factors for pregnancy, etc.) ---Yes List chronic conditions. ---afib, PPM, stroke Is this a behavioral health or substance abuse call? ---No Guidelines Guideline Title Affirmed Question Affirmed Notes Nurse Date/Time Eilene Ghazi Time) Urinary Symptoms Bad or foul-smelling urine Clovis Riley, RN, Georgina Peer 10/27/2020 9:25:37 AM Disp. Time Eilene Ghazi Time) Disposition Final User 10/27/2020 9:27:28 AM See PCP within 24 Hours Yes  Clovis Riley, RN, Georgina Peer PLEASE NOTE: All timestamps contained within this report are represented as [place] Standard Time. CONFIDENTIALTY NOTICE: This fax transmission is intended only for the addressee. It contains information that is legally privileged, confidential or otherwise protected from use or disclosure. If you are not the intended recipient, you are strictly prohibited from reviewing, disclosing, copying using or disseminating any of this information or taking any action in reliance on or regarding this information. If you have received this fax in error, please notify us immediately by telephone so that we can arrange for its return to [place]. Phone: [phone], Toll-Free: [phone], Fax: [phone] Page: 2 of 2 Call Id: [id number] [place] Disagree/Comply Comply Caller Understands Yes PreDisposition Call Doctor Care Advice Given Per Guideline SEE PCP WITHIN 24 HOURS: * IF OFFICE WILL BE OPEN: You need to be examined within the next 24 hours. Call your doctor (or NP/PA) when the office opens and make an appointment. CALL BACK IF: * Fever occurs * Unable to urinate and bladder feels full * You become worse CARE ADVICE given per Urinary Symptoms (Adult) guideline. Referrals REFERRED TO PCP OFFICE

## 2020-10-27 NOTE — Telephone Encounter (Signed)
Noted will see tomorrow 

## 2020-10-27 NOTE — Telephone Encounter (Signed)
[facility]  caregiver said that she did not think that pt was dehydrated and needs IV fluids and if she does Stanton Kidney said she would cb. I spoke with [name] (DPR signed) and she spoke with pt and pts caregiver this morning; and [name] thinks they have a handle on it and pt is not dehydrated at this time.caregivers are trying to encourage pt to drink while awake.  Pt already has HFU 10/28/20 at 11 AM with DR Einar Pheasant. Sending note to Dr Einar Pheasant and [facility] CMA.

## 2020-10-28 ENCOUNTER — Other Ambulatory Visit: Payer: Self-pay

## 2020-10-28 ENCOUNTER — Ambulatory Visit (INDEPENDENT_AMBULATORY_CARE_PROVIDER_SITE_OTHER): Payer: Medicare Other | Admitting: Family Medicine

## 2020-10-28 VITALS — BP 98/60 | HR 71 | Temp 97.5°F | Wt 138.5 lb

## 2020-10-28 DIAGNOSIS — R8290 Unspecified abnormal findings in urine: Secondary | ICD-10-CM | POA: Diagnosis not present

## 2020-10-28 DIAGNOSIS — G459 Transient cerebral ischemic attack, unspecified: Secondary | ICD-10-CM

## 2020-10-28 DIAGNOSIS — I350 Nonrheumatic aortic (valve) stenosis: Secondary | ICD-10-CM

## 2020-10-28 DIAGNOSIS — R296 Repeated falls: Secondary | ICD-10-CM

## 2020-10-28 DIAGNOSIS — R0981 Nasal congestion: Secondary | ICD-10-CM | POA: Diagnosis not present

## 2020-10-28 DIAGNOSIS — E860 Dehydration: Secondary | ICD-10-CM | POA: Diagnosis not present

## 2020-10-28 DIAGNOSIS — F0390 Unspecified dementia without behavioral disturbance: Secondary | ICD-10-CM

## 2020-10-28 DIAGNOSIS — I482 Chronic atrial fibrillation, unspecified: Secondary | ICD-10-CM | POA: Diagnosis not present

## 2020-10-28 LAB — POC URINALSYSI DIPSTICK (AUTOMATED)
Bilirubin, UA: NEGATIVE
Blood, UA: NEGATIVE
Glucose, UA: NEGATIVE
Ketones, UA: NEGATIVE
Nitrite, UA: NEGATIVE
Protein, UA: POSITIVE — AB
Spec Grav, UA: 1.01 (ref 1.010–1.025)
Urobilinogen, UA: 2 E.U./dL — AB
pH, UA: 8.5 — AB (ref 5.0–8.0)

## 2020-10-28 MED ORDER — OXYBUTYNIN CHLORIDE ER 5 MG PO TB24: 5.0000 mg | ORAL_TABLET | Freq: Every day | ORAL | 1 refills | Status: AC

## 2020-10-28 MED ORDER — FLUTICASONE PROPIONATE 50 MCG/ACT NA SUSP: 1.0000 | Freq: Every day | NASAL | 2 refills | Status: AC | PRN

## 2020-10-28 NOTE — Progress Notes (Signed)
Subjective:     [name] is a 87 y.o. female presenting for hospitilazation follow-up and Wound Check     Wound Check   #Falls - 2-3 weeks ago - she notes small fall  - bruise on the head - fell at the hospital - scratches on the arms  #UTI concern - odor to urine - no burning to urination - frequency - urgency and incontinence  - care giver has also noticed she has been sleeping a lot and having weakness - strong odor to urine since yesterday - alert and walking around   #TIA - HH SL/OT/PT - 1-2 days of slurred speech which has improved    Review of Systems  7/27-7/28/2022: Hospitalization - TIA - noted when at thoracic surgery with sudden slurred speech. Symptoms resolved w/in 2 hours. Cont eliquis and add asa 81 mg. Cont lipitor  Social History   Tobacco Use  Smoking Status Former   Packs/day: 0.25   Years: 10.00   Pack years: 2.50   Types: Cigarettes   Quit date: 1980   Years since quitting: 42.6  Smokeless Tobacco Never        Objective:    BP Readings from Last 3 Encounters:  10/28/20 98/60  10/16/20 120/71  10/15/20 (!) 86/50   Wt Readings from Last 3 Encounters:  10/28/20 138 lb 8 oz (62.8 kg)  10/15/20 143 lb 4.8 oz (65 kg)  10/15/20 142 lb (64.4 kg)    BP 98/60   Pulse 71   Temp (!) 97.5 F (36.4 C) (Temporal)   Wt 138 lb 8 oz (62.8 kg)   SpO2 98%   BMI 25.33 kg/m    Physical Exam Constitutional:      General: She is not in acute distress.    Appearance: She is well-developed. She is not diaphoretic.  HENT:     Right Ear: External ear normal.     Left Ear: External ear normal.     Nose: Nose normal.  Eyes:     Conjunctiva/sclera: Conjunctivae normal.  Cardiovascular:     Rate and Rhythm: Normal rate.  Pulmonary:     Effort: Pulmonary effort is normal.  Musculoskeletal:     Cervical back: Neck supple.  Skin:    General: Skin is warm and dry.     Capillary Refill: Capillary refill takes less than 2  seconds.     Comments: Right forehead - healing ecchymosis Right forearm - large healing laceration with dried blood present. Smaller laceration with some blood on Band-Aid but bleeding appears controlled.   Neurological:     Mental Status: She is alert. Mental status is at baseline.  Psychiatric:        Mood and Affect: Mood normal.        Behavior: Behavior normal.          Assessment & Plan:   Problem List Items Addressed This Visit       Cardiovascular and Mediastinum   Chronic a-fib ([place]) (Chronic)   Relevant Orders   Ambulatory referral to [place]   TIA (transient ischemic attack) - Primary    Symptoms generally improving but with some speech difficulty and recurrent falls. Will place [facility] referral for further evaluation and home safety.        Relevant Orders   Ambulatory referral to [place]   Aortic stenosis   Relevant Orders   Ambulatory referral to Home Health     Respiratory   Sinus congestion  Relevant Medications   fluticasone (FLONASE) 50 MCG/ACT nasal spray     Nervous and Auditory   Dementia without behavioral disturbance (HCC)    Stable, Will benefit from [facility]. Appreciate neuro care.        Relevant Orders   Ambulatory referral to [place]     Other   Recurrent falls    Wounds assessed and healing well. HH to help support home safety and ambulation plan.        Relevant Orders   Ambulatory referral to [place]   Unspecified abnormal findings in urine     Nitrates on urine and odor. Discussed watch and wait given no other symptoms. Will f/u culture and treat if positive. 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        Relevant Orders   Urine Culture   Other Visit Diagnoses     Dehydration       Relevant Orders   POCT Urinalysis Dipstick (Automated) (Completed)   Urine Culture        Return if symptoms worsen or fail to improve.  [name], MD  This visit occurred during the SARS-CoV-2 public health  emergency.  Safety protocols were in place, including screening questions prior to the visit, additional usage of staff PPE, and extensive cleaning of exam room while observing appropriate contact time as indicated for disinfecting solutions.

## 2020-10-28 NOTE — Assessment & Plan Note (Signed)
Stable, Will benefit from [facility]. Appreciate neuro care.

## 2020-10-28 NOTE — Patient Instructions (Signed)
#  Urine  - drink more water - will send the culture and treat if positive  #TIA - home health referral

## 2020-10-28 NOTE — Assessment & Plan Note (Signed)
Wounds assessed and healing well. HH to help support home safety and ambulation plan.

## 2020-10-28 NOTE — Assessment & Plan Note (Signed)
Nitrates on urine and odor. Discussed watch and wait given no other symptoms. Will f/u culture and treat if positive. 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

## 2020-10-28 NOTE — Assessment & Plan Note (Signed)
Symptoms generally improving but with some speech difficulty and recurrent falls. Will place [facility] referral for further evaluation and home safety.

## 2020-10-29 ENCOUNTER — Ambulatory Visit (INDEPENDENT_AMBULATORY_CARE_PROVIDER_SITE_OTHER): Payer: Medicare Other | Admitting: Neurology

## 2020-10-29 ENCOUNTER — Encounter: Payer: Self-pay | Admitting: Neurology

## 2020-10-29 VITALS — BP 85/48 | HR 74 | Resp 18 | Ht 61.0 in | Wt 140.0 lb

## 2020-10-29 DIAGNOSIS — F0391 Unspecified dementia with behavioral disturbance: Secondary | ICD-10-CM | POA: Diagnosis not present

## 2020-10-29 DIAGNOSIS — I639 Cerebral infarction, unspecified: Secondary | ICD-10-CM | POA: Diagnosis not present

## 2020-10-29 DIAGNOSIS — F0151 Vascular dementia with behavioral disturbance: Secondary | ICD-10-CM

## 2020-10-29 DIAGNOSIS — F01518 Vascular dementia, unspecified severity, with other behavioral disturbance: Secondary | ICD-10-CM

## 2020-10-29 DIAGNOSIS — F03B18 Unspecified dementia, moderate, with other behavioral disturbance: Secondary | ICD-10-CM

## 2020-10-29 NOTE — Progress Notes (Signed)
NEUROLOGY FOLLOW UP OFFICE NOTE  [name] [id number] 10/17/1930  HISTORY OF PRESENT ILLNESS: I had the pleasure of seeing Girtrue Stcroix in follow-up in the neurology clinic on 10/29/2020.  The patient was last seen 2 months ago by our Memory Disorders PA Sharene Butters to discuss risks/benefits prior to cardiac surgery. She is again accompanied by her daughter [name] who helps supplement the history.  Records and images were personally reviewed where available. On 10/15/20 while at her cardiologist's office for valve replacement work-up, she then started having slurred speech, leaning to the left side. She did not fell well and had also been having an increase in falls, last fall was 09/27/20. She was brought to the ER and evaluated by Neurology with note of slurred speech and right facial droop. Unable to obtain MRI due to PPM, repeat CT head did not show any acute changes. CTA head and neck showed right P2 stenosis. Symptoms concerning for minor left brain stroke, no cortical signs, concerning for small vessel disease. After discussion, aspirin '81mg'$  daily was added on to Eliquis. She is on Lipitor, LDL was 63. Since hospital discharge, she feels "weak as a dish rag." She reports diffuse weakness, no focal weakness noted. She denies any headaches, dizziness, vision changes, dysphagia, focal numbness/tingling/weakness. She reports sleep is good, staff reports good sleep, but she also naps during the day. No paranoia or hallucinations, [name] reports anxiety about some particular thing on a particular day. She continues on Donepezil '10mg'$  daily and Sertraline '50mg'$  daily without side effects. Staff administers medications, her daughter manages finances. She does not drive.   Initial Assessment 06/19/2019: This is an 87 year old right-handed woman with a history of hypertension, hyperlipidemia, hypothyroidism, atrial fibrillation s/p PPM on anticoagulation with Eliquis, presenting for evaluation of  memory loss. She is accompanied by her aide [name] who has only been with her for 2 months. I attempted to contact her daughter for more information, unable to reach her. She states she knows her memory is not good. She gets ready to call a friend and cannot think of their name. She had been living alone until Christmas 2019 after she had several falls. She stayed at [facility] then moved to a different home with almost 24/7 care ([facility]). She is alone from 4 to 8pm. Her daughter/POA lives in [place]. She has a son in [place]. Her daughter manages her checkbook, which aggravates her because she does not not what is going on. She reports she was good with bill payments. She stopped driving 2 years ago, stating her daughter just decided she did not want the patient to drive. She manages her own medications, she fixes 4 boxes every week, aides watch her and report she does well with this. She denies misplacing things and does her own cooking at night. She denies leaving the stove on. [name] reports that she is mostly just forgetful, good with her medications. She is independent with dressing and bathing. No paranoia or hallucinations. She reports a fall when she pulled on a limb, then says "my daughter said it's not true."    PCP notes from 04/11/19 reviewed: "Pt does not seem to have overt symptoms and does have care givers to help her at home. Family dynamic seems challenging - daughter out of state who has one opinion of patient's capability vs care takers who feel she is fairly active and does a good job managing her medications. Discussed neurology referral for further evaluation of noted  memory changes, but also encouraged her contact social services if she feels she should have more control - that if work-up shows good mental ability she could regain control of her own finances."   She denies any headaches, neck/back pain, focal tingling/weakness, bladder dysfunction, anosmia, tremors. She feels  her eyes are blurred when her eyelids are heavy, sometimes making her feel dizzy. She denies any falls in the past 4 months. Sleep is okay. She has swelling in both legs. Her left leg has felt weaker for the past 2-3 months. She has tingling in her fingers.   Diagnostic Data: Head CT without contrast in 06/2019 showed moderate cerebral atrophy, chronic microvascular disease.  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 She had anosognosia with lack of insight into her condition, and felt incapable of managing high-level affairs. She was advised not to drive.    PAST MEDICAL HISTORY: Past Medical History:  Diagnosis Date   Chronic anticoagulation    Chronic atrial fibrillation ([place]) 01/28/2017   Chronic venous insufficiency 01/28/2017   Clavicle fracture 11/20/2017   Long term current use of anticoagulant therapy 01/28/2017   Osteoarthritis    Presence of permanent cardiac pacemaker 01/28/2017   Original implant 1992 Lead Intermedics 431-04 Generator change 2001 and 2011.  Medtronic generator.    Severe aortic stenosis    SSS (sick sinus syndrome) (HCC)    Thyroid disease    hypothyroidism   TIA (transient ischemic attack)     MEDICATIONS: Current Outpatient Medications on File Prior to Visit  Medication Sig Dispense Refill   acetaminophen (TYLENOL) 500 MG tablet Take 1,000 mg by mouth every 8 (eight) hours as needed for moderate pain or headache.      aspirin EC 81 MG EC tablet Take 1 tablet (81 mg total) by mouth daily. Swallow whole. 30 tablet 11   atorvastatin (LIPITOR) 20 MG tablet TAKE 1 TABLET BY MOUTH EVERY DAY IN THE EVENING 90 tablet 1   Carboxymethylcell-Hypromellose (GENTEAL OP) Place 1 drop into both eyes daily as needed (dry eyes).     cetirizine (ZYRTEC) 5 MG tablet  TAKE 1 TABLET BY MOUTH EVERY DAY 90 tablet 2   D 2000 50 MCG (2000 UT) TABS TAKE 1 TABLET BY MOUTH EVERY DAY 90 tablet 3   donepezil (ARICEPT) 10 MG tablet Take 1 tablet daily 90 tablet 3   ELIQUIS 5 MG TABS tablet TAKE 1 TABLET BY MOUTH TWICE A DAY 180 tablet 1   fluticasone (FLONASE) 50 MCG/ACT nasal spray Place 1 spray into both nostrils daily as needed for allergies or rhinitis. 16 mL 2   furosemide (LASIX) 20 MG tablet TAKE 1 TABLET BY MOUTH EVERY DAY 90 tablet 1   levothyroxine (SYNTHROID) 88 MCG tablet TAKE 1 TABLET BY MOUTH EVERY DAY IN THE MORNING ON AN EMPTY STOMACH 90 tablet 3   lisinopril (ZESTRIL) 5 MG tablet Take 1 tablet (5 mg total) by mouth daily. 90 tablet 3   oxybutynin (DITROPAN-XL) 5 MG 24 hr tablet Take 1 tablet (5 mg total) by mouth daily. 90 tablet 1   sertraline (ZOLOFT) 50 MG tablet Take 1 tablet (50 mg total) by mouth at bedtime. 30 tablet 2   No current facility-administered medications on file prior to visit.    ALLERGIES: Allergies  Allergen Reactions   Adhesive [Tape] Other (See Comments)  TAPE WILL TEAR THE SKIN!!!!   Penicillins Hives and Rash    Did it involve swelling of the face/tongue/throat, SOB, or low BP? No Did it involve sudden or severe rash/hives, skin peeling, or any reaction on the inside of your mouth or nose? Yes Did you need to seek medical attention at a hospital or doctor's office? Unknown  When did it last happen?      unknown  If all above answers are "NO", may proceed with cephalosporin use.     FAMILY HISTORY: Family History  Problem Relation Age of Onset   Diabetes Mother    Mental illness Mother    Diabetes Father    Heart disease Father     SOCIAL HISTORY: Social History   Socioeconomic History   Marital status: Widowed    Spouse name: Not on file   Number of children: 2   Years of education: 2- year college   Highest education level: Not on file  Occupational History   Not on file  Tobacco Use   Smoking  status: Former    Packs/day: 0.25    Years: 10.00    Pack years: 2.50    Types: Cigarettes    Quit date: 1980    Years since quitting: 42.6   Smokeless tobacco: Never  Vaping Use   Vaping Use: Never used  Substance and Sexual Activity   Alcohol use: No   Drug use: No   Sexual activity: Never  Other Topics Concern   Not on file  Social History Narrative   03/14/19   From: the area   Living: alone, but caretaker that - 20 hours a day of care givers, is alone from 4-8 pm   Work: retired -    Widowed: husband died from [place], used to run a golf course      Family: 2 children - [name] ([place]), [place] (lives nearby), has grandchildren and Designer, industrial/product      Enjoys: play golf (not as much), painting, crafting    Right handed      Right handed   Exercise: yard work   Diet: anything, whatever she can find      Land belts: Yes    Guns: No   Safe in relationships: Yes    Social Determinants of Radio broadcast assistant Strain: Low Risk    Difficulty of Paying Living Expenses: Not hard at all  Food Insecurity: No Food Insecurity   Worried About Charity fundraiser in the Last Year: Never true   Arboriculturist in the Last Year: Never true  Transportation Needs: No Transportation Needs   Lack of Transportation (Medical): No   Lack of Transportation (Non-Medical): No  Physical Activity: Inactive   Days of Exercise per Week: 0 days   Minutes of Exercise per Session: 0 min  Stress: No Stress Concern Present   Feeling of Stress : Not at all  Social Connections: Not on file  Intimate Partner Violence: Not At Risk   Fear of Current or Ex-Partner: No   Emotionally Abused: No   Physically Abused: No   Sexually Abused: No     PHYSICAL EXAM: Vitals:   10/29/20 1512  BP: (!) 85/48  Pulse: 74  Resp: 18  SpO2: 95%   General: No acute distress Head:  Normocephalic/atraumatic Skin/Extremities: No rash, no edema Neurological Exam:  alert and oriented to person, place. No aphasia or dysarthria. Fund of knowledge is appropriate.  Recent and remote memory are impaired. Attention and concentration are normal. Cranial nerves: Pupils equal, round. Extraocular movements intact with no nystagmus. Visual fields full.  She has a symmetric smile, they report the right nasolabial fold is lifted more. Motor: Bulk and tone normal, muscle strength 5/5 throughout with no pronator drift.   Finger to nose testing intact.  Gait not tested, sitting on wheelchair.   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 She had a small stroke last 10/15/20 with right facial weakness and slurred speech, no weakness in extremities, felt to have a small left brain stroke due to small vessel disease. CTA showed right P2 stenosis. She was discharged home on aspirin '81mg'$  daily in addition to Eliquis, continue control of vascular risk factors. She has not started PT and OT yet, proceed as scheduled. Her cardiac surgery is on hold for now. Continue close supervision. Continue Donepezil '10mg'$  daily and Sertraline '50mg'$  daily. Follow-up in 3 months, call for any changes.   Thank you for allowing me to participate in her care.  Please do not hesitate to call for any questions or concerns.    Ellouise Newer, M.D.   CC: Dr. Einar Pheasant

## 2020-10-29 NOTE — Patient Instructions (Signed)
Good to see you. Continue all your medications. Proceed with physical therapy and occupational therapy. Follow-up in 3 months, call for any changes

## 2020-10-31 ENCOUNTER — Telehealth: Payer: Self-pay | Admitting: Family Medicine

## 2020-10-31 LAB — URINE CULTURE
MICRO NUMBER:: 12219611
SPECIMEN QUALITY:: ADEQUATE

## 2020-10-31 NOTE — Telephone Encounter (Signed)
[name] called in and wanted to know about the results from the urinalysis from 8/9 and if she sent her in anything due to she is still weak and has an order

## 2020-10-31 NOTE — Telephone Encounter (Signed)
Routing to lab, I have a preliminary result but still waiting for final. Can someone look into this.

## 2020-11-03 ENCOUNTER — Telehealth: Payer: Self-pay | Admitting: *Deleted

## 2020-11-03 ENCOUNTER — Other Ambulatory Visit: Payer: Self-pay | Admitting: Family Medicine

## 2020-11-03 DIAGNOSIS — N3000 Acute cystitis without hematuria: Secondary | ICD-10-CM

## 2020-11-03 MED ORDER — CEPHALEXIN 500 MG PO CAPS: 500.0000 mg | ORAL_CAPSULE | Freq: Two times a day (BID) | ORAL | 0 refills | Status: AC

## 2020-11-03 NOTE — Telephone Encounter (Signed)
Spoke to pt's daughter, Stanton Kidney ([place]), and caretaker Stanton Kidney on group call to go over results and plan.

## 2020-11-03 NOTE — Telephone Encounter (Signed)
Patient's daughter [name] left a voicemail stating that she was just on the phone with the office. [name] stated that her mom is suppose to have PT, OT and speech. [name] stated that they have not heard anything about this. [name] wanted to confirm that Dr. Einar Pheasant is getting this set up.

## 2020-11-03 NOTE — Telephone Encounter (Signed)
Working on [employer] placement Will update as I know more

## 2020-11-03 NOTE — Progress Notes (Signed)
Spoke with family. Will assess allergy to cephalosporins as no infection with resistance to many other common agents

## 2020-11-03 NOTE — Telephone Encounter (Signed)
Referral was placed for services last week  Routing to referral coordinator for update on timing

## 2020-11-03 NOTE — Telephone Encounter (Signed)
Ms. [name] called to follow up on results. She stated that pt urine still has a strong odor

## 2020-11-04 ENCOUNTER — Emergency Department (HOSPITAL_COMMUNITY): Payer: Medicare Other

## 2020-11-04 ENCOUNTER — Telehealth: Payer: Self-pay | Admitting: Family Medicine

## 2020-11-04 ENCOUNTER — Encounter (HOSPITAL_COMMUNITY): Payer: Self-pay

## 2020-11-04 ENCOUNTER — Inpatient Hospital Stay (HOSPITAL_COMMUNITY)
Admission: EM | Admit: 2020-11-04 | Discharge: 2020-11-20 | DRG: 951 | Disposition: E | Payer: Medicare Other | Attending: Pulmonary Disease | Admitting: Pulmonary Disease

## 2020-11-04 DIAGNOSIS — I495 Sick sinus syndrome: Secondary | ICD-10-CM | POA: Diagnosis present

## 2020-11-04 DIAGNOSIS — Z818 Family history of other mental and behavioral disorders: Secondary | ICD-10-CM | POA: Diagnosis not present

## 2020-11-04 DIAGNOSIS — I472 Ventricular tachycardia: Secondary | ICD-10-CM | POA: Diagnosis present

## 2020-11-04 DIAGNOSIS — I614 Nontraumatic intracerebral hemorrhage in cerebellum: Secondary | ICD-10-CM

## 2020-11-04 DIAGNOSIS — R404 Transient alteration of awareness: Secondary | ICD-10-CM | POA: Diagnosis not present

## 2020-11-04 DIAGNOSIS — Z888 Allergy status to other drugs, medicaments and biological substances status: Secondary | ICD-10-CM

## 2020-11-04 DIAGNOSIS — Z833 Family history of diabetes mellitus: Secondary | ICD-10-CM

## 2020-11-04 DIAGNOSIS — I619 Nontraumatic intracerebral hemorrhage, unspecified: Secondary | ICD-10-CM | POA: Insufficient documentation

## 2020-11-04 DIAGNOSIS — Z20822 Contact with and (suspected) exposure to covid-19: Secondary | ICD-10-CM | POA: Diagnosis present

## 2020-11-04 DIAGNOSIS — E039 Hypothyroidism, unspecified: Secondary | ICD-10-CM | POA: Diagnosis present

## 2020-11-04 DIAGNOSIS — Z90710 Acquired absence of both cervix and uterus: Secondary | ICD-10-CM

## 2020-11-04 DIAGNOSIS — Z950 Presence of cardiac pacemaker: Secondary | ICD-10-CM

## 2020-11-04 DIAGNOSIS — I462 Cardiac arrest due to underlying cardiac condition: Secondary | ICD-10-CM | POA: Diagnosis present

## 2020-11-04 DIAGNOSIS — I4901 Ventricular fibrillation: Secondary | ICD-10-CM | POA: Diagnosis present

## 2020-11-04 DIAGNOSIS — R29702 NIHSS score 2: Secondary | ICD-10-CM | POA: Diagnosis present

## 2020-11-04 DIAGNOSIS — R4182 Altered mental status, unspecified: Secondary | ICD-10-CM | POA: Diagnosis not present

## 2020-11-04 DIAGNOSIS — Z8249 Family history of ischemic heart disease and other diseases of the circulatory system: Secondary | ICD-10-CM

## 2020-11-04 DIAGNOSIS — N179 Acute kidney failure, unspecified: Secondary | ICD-10-CM | POA: Diagnosis present

## 2020-11-04 DIAGNOSIS — R739 Hyperglycemia, unspecified: Secondary | ICD-10-CM | POA: Diagnosis not present

## 2020-11-04 DIAGNOSIS — Z7982 Long term (current) use of aspirin: Secondary | ICD-10-CM

## 2020-11-04 DIAGNOSIS — Z79899 Other long term (current) drug therapy: Secondary | ICD-10-CM

## 2020-11-04 DIAGNOSIS — M1990 Unspecified osteoarthritis, unspecified site: Secondary | ICD-10-CM | POA: Diagnosis present

## 2020-11-04 DIAGNOSIS — F0391 Unspecified dementia with behavioral disturbance: Secondary | ICD-10-CM | POA: Diagnosis present

## 2020-11-04 DIAGNOSIS — I482 Chronic atrial fibrillation, unspecified: Secondary | ICD-10-CM | POA: Diagnosis present

## 2020-11-04 DIAGNOSIS — Z515 Encounter for palliative care: Secondary | ICD-10-CM | POA: Diagnosis not present

## 2020-11-04 DIAGNOSIS — I469 Cardiac arrest, cause unspecified: Principal | ICD-10-CM | POA: Diagnosis present

## 2020-11-04 DIAGNOSIS — I611 Nontraumatic intracerebral hemorrhage in hemisphere, cortical: Secondary | ICD-10-CM | POA: Diagnosis present

## 2020-11-04 DIAGNOSIS — R0689 Other abnormalities of breathing: Secondary | ICD-10-CM | POA: Diagnosis not present

## 2020-11-04 DIAGNOSIS — Z96653 Presence of artificial knee joint, bilateral: Secondary | ICD-10-CM | POA: Diagnosis present

## 2020-11-04 DIAGNOSIS — I959 Hypotension, unspecified: Secondary | ICD-10-CM | POA: Diagnosis present

## 2020-11-04 DIAGNOSIS — Z7901 Long term (current) use of anticoagulants: Secondary | ICD-10-CM

## 2020-11-04 DIAGNOSIS — R0902 Hypoxemia: Secondary | ICD-10-CM | POA: Diagnosis not present

## 2020-11-04 DIAGNOSIS — I10 Essential (primary) hypertension: Secondary | ICD-10-CM | POA: Diagnosis not present

## 2020-11-04 DIAGNOSIS — Z87891 Personal history of nicotine dependence: Secondary | ICD-10-CM

## 2020-11-04 DIAGNOSIS — G919 Hydrocephalus, unspecified: Secondary | ICD-10-CM | POA: Diagnosis present

## 2020-11-04 DIAGNOSIS — N390 Urinary tract infection, site not specified: Secondary | ICD-10-CM | POA: Diagnosis present

## 2020-11-04 DIAGNOSIS — Z66 Do not resuscitate: Secondary | ICD-10-CM | POA: Diagnosis present

## 2020-11-04 DIAGNOSIS — I517 Cardiomegaly: Secondary | ICD-10-CM | POA: Diagnosis not present

## 2020-11-04 DIAGNOSIS — J9601 Acute respiratory failure with hypoxia: Secondary | ICD-10-CM | POA: Diagnosis not present

## 2020-11-04 DIAGNOSIS — Z880 Allergy status to penicillin: Secondary | ICD-10-CM

## 2020-11-04 DIAGNOSIS — Z8673 Personal history of transient ischemic attack (TIA), and cerebral infarction without residual deficits: Secondary | ICD-10-CM

## 2020-11-04 DIAGNOSIS — Z79890 Hormone replacement therapy (postmenopausal): Secondary | ICD-10-CM

## 2020-11-04 LAB — BLOOD GAS, ARTERIAL
Acid-base deficit: 7.9 mmol/L — ABNORMAL HIGH (ref 0.0–2.0)
Bicarbonate: 18.2 mmol/L — ABNORMAL LOW (ref 20.0–28.0)
Drawn by: 42783
FIO2: 100
O2 Saturation: 99.6 %
Patient temperature: 37
pCO2 arterial: 44.3 mmHg (ref 32.0–48.0)
pH, Arterial: 7.238 — ABNORMAL LOW (ref 7.350–7.450)
pO2, Arterial: 309 mmHg — ABNORMAL HIGH (ref 83.0–108.0)

## 2020-11-04 LAB — TYPE AND SCREEN
ABO/RH(D): O NEG
Antibody Screen: NEGATIVE

## 2020-11-04 LAB — I-STAT VENOUS BLOOD GAS, ED
Acid-base deficit: 10 mmol/L — ABNORMAL HIGH (ref 0.0–2.0)
Bicarbonate: 19.9 mmol/L — ABNORMAL LOW (ref 20.0–28.0)
Calcium, Ion: 1.07 mmol/L — ABNORMAL LOW (ref 1.15–1.40)
HCT: 29 % — ABNORMAL LOW (ref 36.0–46.0)
Hemoglobin: 9.9 g/dL — ABNORMAL LOW (ref 12.0–15.0)
O2 Saturation: 82 %
Potassium: 3.7 mmol/L (ref 3.5–5.1)
Sodium: 132 mmol/L — ABNORMAL LOW (ref 135–145)
TCO2: 22 mmol/L (ref 22–32)
pCO2, Ven: 65.4 mmHg — ABNORMAL HIGH (ref 44.0–60.0)
pH, Ven: 7.091 — CL (ref 7.250–7.430)
pO2, Ven: 64 mmHg — ABNORMAL HIGH (ref 32.0–45.0)

## 2020-11-04 LAB — I-STAT CHEM 8, ED
BUN: 25 mg/dL — ABNORMAL HIGH (ref 8–23)
Calcium, Ion: 1.08 mmol/L — ABNORMAL LOW (ref 1.15–1.40)
Chloride: 96 mmol/L — ABNORMAL LOW (ref 98–111)
Creatinine, Ser: 1.4 mg/dL — ABNORMAL HIGH (ref 0.44–1.00)
Glucose, Bld: 288 mg/dL — ABNORMAL HIGH (ref 70–99)
HCT: 29 % — ABNORMAL LOW (ref 36.0–46.0)
Hemoglobin: 9.9 g/dL — ABNORMAL LOW (ref 12.0–15.0)
Potassium: 3.7 mmol/L (ref 3.5–5.1)
Sodium: 131 mmol/L — ABNORMAL LOW (ref 135–145)
TCO2: 23 mmol/L (ref 22–32)

## 2020-11-04 LAB — CBC WITH DIFFERENTIAL/PLATELET
Abs Immature Granulocytes: 1.18 10*3/uL — ABNORMAL HIGH (ref 0.00–0.07)
Basophils Absolute: 0.1 10*3/uL (ref 0.0–0.1)
Basophils Relative: 0 %
Eosinophils Absolute: 0 10*3/uL (ref 0.0–0.5)
Eosinophils Relative: 0 %
HCT: 31.9 % — ABNORMAL LOW (ref 36.0–46.0)
Hemoglobin: 9.2 g/dL — ABNORMAL LOW (ref 12.0–15.0)
Immature Granulocytes: 6 %
Lymphocytes Relative: 7 %
Lymphs Abs: 1.5 10*3/uL (ref 0.7–4.0)
MCH: 26.6 pg (ref 26.0–34.0)
MCHC: 28.8 g/dL — ABNORMAL LOW (ref 30.0–36.0)
MCV: 92.2 fL (ref 80.0–100.0)
Monocytes Absolute: 1.1 10*3/uL — ABNORMAL HIGH (ref 0.1–1.0)
Monocytes Relative: 5 %
Neutro Abs: 17 10*3/uL — ABNORMAL HIGH (ref 1.7–7.7)
Neutrophils Relative %: 82 %
Platelets: 189 10*3/uL (ref 150–400)
RBC: 3.46 MIL/uL — ABNORMAL LOW (ref 3.87–5.11)
RDW: 19.8 % — ABNORMAL HIGH (ref 11.5–15.5)
WBC: 20.9 10*3/uL — ABNORMAL HIGH (ref 4.0–10.5)
nRBC: 0.2 % (ref 0.0–0.2)

## 2020-11-04 LAB — PROTIME-INR
INR: 2.9 — ABNORMAL HIGH (ref 0.8–1.2)
Prothrombin Time: 29.9 seconds — ABNORMAL HIGH (ref 11.4–15.2)

## 2020-11-04 LAB — CBG MONITORING, ED: Glucose-Capillary: 267 mg/dL — ABNORMAL HIGH (ref 70–99)

## 2020-11-04 LAB — MAGNESIUM: Magnesium: 2.4 mg/dL (ref 1.7–2.4)

## 2020-11-04 LAB — COMPREHENSIVE METABOLIC PANEL
ALT: 70 U/L — ABNORMAL HIGH (ref 0–44)
AST: 104 U/L — ABNORMAL HIGH (ref 15–41)
Albumin: 2.5 g/dL — ABNORMAL LOW (ref 3.5–5.0)
Alkaline Phosphatase: 52 U/L (ref 38–126)
Anion gap: 14 (ref 5–15)
BUN: 22 mg/dL (ref 8–23)
CO2: 20 mmol/L — ABNORMAL LOW (ref 22–32)
Calcium: 8 mg/dL — ABNORMAL LOW (ref 8.9–10.3)
Chloride: 97 mmol/L — ABNORMAL LOW (ref 98–111)
Creatinine, Ser: 1.54 mg/dL — ABNORMAL HIGH (ref 0.44–1.00)
GFR, Estimated: 32 mL/min — ABNORMAL LOW (ref >60)
Glucose, Bld: 297 mg/dL — ABNORMAL HIGH (ref 70–99)
Potassium: 3.8 mmol/L (ref 3.5–5.1)
Sodium: 131 mmol/L — ABNORMAL LOW (ref 135–145)
Total Bilirubin: 1.6 mg/dL — ABNORMAL HIGH (ref 0.3–1.2)
Total Protein: 5.2 g/dL — ABNORMAL LOW (ref 6.5–8.1)

## 2020-11-04 LAB — LACTIC ACID, PLASMA
Lactic Acid, Venous: 9 mmol/L (ref 0.5–1.9)
Lactic Acid, Venous: 7.4 mmol/L (ref 0.5–1.9)

## 2020-11-04 LAB — RESP PANEL BY RT-PCR (FLU A&B, COVID) ARPGX2
Influenza A by PCR: NEGATIVE
Influenza B by PCR: NEGATIVE
SARS Coronavirus 2 by RT PCR: NEGATIVE

## 2020-11-04 LAB — TROPONIN I (HIGH SENSITIVITY)
Troponin I (High Sensitivity): 96 ng/L — ABNORMAL HIGH (ref <18)
Troponin I (High Sensitivity): 468 ng/L (ref <18)

## 2020-11-04 LAB — BRAIN NATRIURETIC PEPTIDE: B Natriuretic Peptide: 458.1 pg/mL — ABNORMAL HIGH (ref 0.0–100.0)

## 2020-11-04 LAB — PROCALCITONIN: Procalcitonin: 0.34 ng/mL

## 2020-11-04 MED ORDER — ACETAMINOPHEN 325 MG PO TABS
650.0000 mg | ORAL_TABLET | ORAL | Status: DC | PRN
Start: 2020-11-04 — End: 2020-11-04

## 2020-11-04 MED ORDER — EPINEPHRINE HCL 5 MG/250ML IV SOLN IN NS
0.5000 ug/min | INTRAVENOUS | Status: DC
  Administered 2020-11-04: 2 ug/min via INTRAVENOUS

## 2020-11-04 MED ORDER — DOCUSATE SODIUM 100 MG PO CAPS: 100.0000 mg | ORAL_CAPSULE | Freq: Two times a day (BID) | ORAL | Status: DC | PRN

## 2020-11-04 MED ORDER — NOREPINEPHRINE 4 MG/250ML-% IV SOLN
0.0000 ug/min | INTRAVENOUS | Status: DC
Start: 2020-11-04 — End: 2020-11-05
  Administered 2020-11-04: 2 ug/min via INTRAVENOUS
  Filled 2020-11-04: qty 250

## 2020-11-04 MED ORDER — GLYCOPYRROLATE 1 MG PO TABS
1.0000 mg | ORAL_TABLET | ORAL | Status: DC | PRN
  Filled 2020-11-04: qty 1

## 2020-11-04 MED ORDER — ALBUTEROL SULFATE (2.5 MG/3ML) 0.083% IN NEBU: 2.5000 mg | INHALATION_SOLUTION | RESPIRATORY_TRACT | Status: DC | PRN

## 2020-11-04 MED ORDER — MORPHINE 100MG IN NS 100ML (1MG/ML) PREMIX INFUSION
0.0000 mg/h | INTRAVENOUS | Status: DC
  Administered 2020-11-04: 5 mg/h via INTRAVENOUS
  Filled 2020-11-04: qty 100

## 2020-11-04 MED ORDER — POLYETHYLENE GLYCOL 3350 17 G PO PACK: 17.0000 g | PACK | Freq: Every day | ORAL | Status: DC | PRN

## 2020-11-04 MED ORDER — ONDANSETRON 4 MG PO TBDP: 4.0000 mg | ORAL_TABLET | Freq: Four times a day (QID) | ORAL | Status: DC | PRN

## 2020-11-04 MED ORDER — GLYCOPYRROLATE 0.2 MG/ML IJ SOLN: 0.2000 mg | INTRAMUSCULAR | Status: DC | PRN

## 2020-11-04 MED ORDER — CALCIUM GLUCONATE-NACL 1-0.675 GM/50ML-% IV SOLN
1.0000 g | Freq: Once | INTRAVENOUS | Status: AC
  Administered 2020-11-04: 1000 mg via INTRAVENOUS

## 2020-11-04 MED ORDER — ACETAMINOPHEN 325 MG PO TABS: 650.0000 mg | ORAL_TABLET | Freq: Four times a day (QID) | ORAL | Status: DC | PRN

## 2020-11-04 MED ORDER — MORPHINE BOLUS VIA INFUSION
5.0000 mg | INTRAVENOUS | Status: DC | PRN
  Filled 2020-11-04: qty 5

## 2020-11-04 MED ORDER — ONDANSETRON HCL 4 MG/2ML IJ SOLN: 4.0000 mg | Freq: Four times a day (QID) | INTRAMUSCULAR | Status: DC | PRN

## 2020-11-04 MED ORDER — LORAZEPAM 2 MG/ML IJ SOLN: 2.0000 mg | INTRAMUSCULAR | Status: DC | PRN

## 2020-11-04 MED ORDER — SODIUM CHLORIDE 0.9 % IV SOLN
1.0000 g | Freq: Once | INTRAVENOUS | Status: AC
  Administered 2020-11-04: 1 g via INTRAVENOUS
  Filled 2020-11-04: qty 10

## 2020-11-04 MED ORDER — POLYVINYL ALCOHOL 1.4 % OP SOLN
1.0000 [drp] | Freq: Four times a day (QID) | OPHTHALMIC | Status: DC | PRN
  Filled 2020-11-04: qty 15

## 2020-11-04 MED ORDER — ACETAMINOPHEN 650 MG RE SUPP: 650.0000 mg | Freq: Four times a day (QID) | RECTAL | Status: DC | PRN

## 2020-11-04 MED ORDER — MORPHINE SULFATE (PF) 2 MG/ML IV SOLN: 2.0000 mg | INTRAVENOUS | Status: DC | PRN

## 2020-11-04 MED ORDER — DEXTROSE 5 % IV SOLN: INTRAVENOUS | Status: DC

## 2020-11-04 MED ORDER — SODIUM CHLORIDE 0.9 % IV BOLUS
1000.0000 mL | Freq: Once | INTRAVENOUS | Status: AC
  Administered 2020-11-04: 1000 mL via INTRAVENOUS

## 2020-11-04 MED ORDER — DIPHENHYDRAMINE HCL 50 MG/ML IJ SOLN: 25.0000 mg | INTRAMUSCULAR | Status: DC | PRN

## 2020-11-06 NOTE — Telephone Encounter (Signed)
Referral cancelled Patient is deceased.

## 2020-11-07 ENCOUNTER — Ambulatory Visit: Payer: Medicare Other | Admitting: Neurology

## 2020-11-20 NOTE — ED Notes (Signed)
Notified Ravi MD about pt BP trending up and that levo has been titrated down to 73mg/min

## 2020-11-20 NOTE — Progress Notes (Addendum)
Goals of Care Discussion   I spoke with patient's daughter [name] who is the patient's POA. We discussed the patient's current medical situation  in detail , to include post Cardiac Arrest and cerebral hemorrhage and  acute respiratory failure. I explained that her mother is unresponsive without sedation and that her quality of life and hope for any meaningful survival is  minimal. We discussed that the patient signed a DNR in 2010, and that her current level of care ( Intubation and pressors) would most likely not be what she had intended. The family agreed.We discussed the option of Comfort Care, which would include starting a morphine gtt , making sure patient is comfortable, and then extubating her and letting her pass away in peace. [name], her daughter felt this was the best option , and her brother was I agreement.  I have placed the orders for comfort care. I have also changed the bed order to Palliative bed, to avoid patient being transferred to ICU for comfort measures . I spoke with the ED Charge nurse who will keep patient in Ed until extubated, and then transfer patient to Palliative/ Med Surg Bed so the family can be at her bedside.  Pt. Daughter did specify that she does not want to be in the room while patient  was extubated , but did want to be with her once the procedure was completed. I reassured her this was fine, and that the staff would work with her to meet her needs.   Magdalen Spatz, MSN, AGACNP-BC [place] for personal pager PCCM on call pager [phone]  11-09-20 3:36 PM   Advanced Care Planning Time 12 minutes

## 2020-11-20 NOTE — ED Notes (Signed)
Phlebotomy unable to obtain blood cultures d/t veins blowing. Regenia Skeeter MD aware and ok with no cultures.

## 2020-11-20 NOTE — ED Notes (Signed)
Patient placement called and notified that post mortem checklist is complete at this time.

## 2020-11-20 NOTE — ED Triage Notes (Signed)
Pt arrived to ED via EMS from home w/ c/o unresponsive. Per EMS report, home health aide said pt was not doing well at 0600, called family and family reported pt was not alert or talking. EMS was called at 0930 for unresponsive. Upon arrival to scene, pt w/ agonal respirations. EMS assisted respirations and pt went into cardiac arrest once loaded on truck. EMS reports they did CPR for 33mn prior to arrival to ED. LLinton Rumpwas still in place and compressing upon arrival to ED. EMS reports pt received 6 of epi with them, they gave '300mg'$  amio and delivered 2 shocks. 1st shock was for Vtach and 2nd shock for Vfib and then pt was PEA. EMS placed 7.0 ET tube and 20g L AC. Hx of dementia and UTI on abx, on eliquis and has pacemaker.

## 2020-11-20 NOTE — Consult Note (Signed)
Neurology Consultation Reason for Consult: [place] Referring Physician: Tyron Russell  CC: unresponsiveness.   History is obtained from:chart review  HPI: DELILIA TEAFF is a 87 y.o. female with a history of atrial fibrillation on chronic anticoagulation who declines this morning, starting around 6 AM and then EMS was called around 9:30 AM for unresponsiveness.  She then had a cardiac arrest and EMS did CPR for 40 minutes, Lucas device was still in place on arrival.  She did eventually achieve ROSC and a CT was performed which demonstrates a massive cerebellar hemorrhage.  Neurology was consulted to assess for futility regarding anticoagulation reversal.   LKW: Last night, prior to bed tpa given?: no, ICH ICH Score: 6    ROS: unable to obtain due to altered mental status.   Past Medical History:  Diagnosis Date   Chronic anticoagulation    Chronic atrial fibrillation ([place]) 01/28/2017   Chronic venous insufficiency 01/28/2017   Clavicle fracture 11/20/2017   Long term current use of anticoagulant therapy 01/28/2017   Osteoarthritis    Presence of permanent cardiac pacemaker 01/28/2017   Original implant 1992 Lead [employer]med[employer] 431-04 Generator change 2001 and 2011.  Medtronic generator.    Severe aortic stenosis    SSS (sick sinus syndrome) (HCC)    Thyroid disease    hypothyroidism   TIA (transient ischemic attack)      Family History  Problem Relation Age of Onset   Diabetes Mother    Mental illness Mother    Diabetes Father    Heart disease Father      Social History:  reports that she quit smoking about 42 years ago. Her smoking use included cigarettes. She has a 2.50 pack-year smoking history. She has never used smokeless tobacco. She reports that she does not drink alcohol and does not use drugs.   Exam: Current vital signs: BP 128/80   Pulse 90   Temp (!) 96.5 F (35.8 C) (Temporal)   Resp (!) 24   Ht '5\' 2"'$  (1.575 m)   SpO2 100%   BMI 25.61 kg/m  Vital  signs in last 24 hours: Temp:  [96.5 F (35.8 C)] 96.5 F (35.8 C) (08/16 1131) Pulse Rate:  [49-130] 90 (08/16 1400) Resp:  [10-27] 24 (08/16 1400) BP: (56-147)/(22-92) 128/80 (08/16 1405) SpO2:  [98 %-100 %] 100 % (08/16 1405) FiO2 (%):  [100 %] 100 % (08/16 1131)   Physical Exam  Constitutional: Appears well-developed and well-nourished.  Psych: She is unresponsive Eyes: No scleral injection HENT: ET tube in place MSK: no joint deformities.  Cardiovascular: Tachycardic Respiratory: Ventilated GI: Soft.  No distension. There is no tenderness.  Skin: WDI  Neuro: Mental Status: Patient is comatose, she does not open eyes or follow commands Cranial Nerves: II: She does not blink to threat. Pupils are equal, round, and sluggishly reactive to light.   III,IV, VI: No response to doll's eye maneuver V:VII: Corneals are absent Motor: Tone is flaccid, she has no response to noxious stimulation  sensory: As above  Cerebellar: Does not perform     I have reviewed labs in epic and the results pertinent to this consultation are: Creatinine 1.4  I have reviewed the images obtained: CT head-massive cerebellar hemorrhage with effacement of the basilar cisterns  Impression: 87 year old female with massive cerebellar hemorrhage in the setting of cardiac arrest.  My suspicion is the hemorrhage happened first, but this is not definite.  In any case, this is not a survivable hemorrhage and family  has made her DNR.  Aggressive interventions such as surgical therapy or anticoagulation reversal are medically futile at this point.    They are reportedly hoping that family member from [place] can arrive prior to withdrawal of care.  Recommendations: 1) I would recommend moving to comfort care 2) neurology will be available on an as-needed basis.  This patient is critically ill and at significant risk of neurological worsening, death and care requires constant monitoring of vital  signs, hemodynamics,respiratory and cardiac monitoring, neurological assessment, discussion with family, other specialists and medical decision making of high complexity. I spent 35 minutes of neurocritical care time  in the care of  this patient. This was time spent independent of any time provided by nurse practitioner or PA.  Roland Rack, MD [facility] [phone]  If 7pm- 7am, please page neurology on call as listed in [place]. 14-Nov-2020  3:10 PM

## 2020-11-20 NOTE — ED Notes (Signed)
Time of death 93. Confirmed by this RN and Ray MD

## 2020-11-20 NOTE — Progress Notes (Signed)
RT assisted with transportation of this from ED to CT then back to ED room while on full ventilatory support. Pt tolerated well with SVS. RN at bedside at this time.

## 2020-11-20 NOTE — Progress Notes (Signed)
Responded to ED nurse page to visit with patient and to support family.  Escorted family to bedside for visit. Per family request Chaplain prayed for patient and with family.  Provided emotional and spiritual support.  Continued support until family departure.     Jaclynn Major, [place], [facility], Pager [phone]

## 2020-11-20 NOTE — Telephone Encounter (Signed)
Patient's caregiver, Mrs. Javier Glazier, called to report that patient was unresponsive and she has been unable to wake her up.  Immediately she was told to call 911.    I spoke with Mrs. Slade after 911 had been called and within minutes 1st responders were on the scene.  Mrs. Javier Glazier said that patient was snoring and sleeping so soundly she couldn't wake her up.  I told Mrs. Javier Glazier to please contact us if she was able to let us know if patient was being taken to the hospital or not but that EMS was the best to evaluate her at this point.

## 2020-11-20 NOTE — ED Notes (Signed)
Pt's son at bedside

## 2020-11-20 NOTE — ED Notes (Signed)
Post-mortem care being provided at this time. Pt will be transported to morgue afterwards

## 2020-11-20 NOTE — Telephone Encounter (Signed)
Noted will await current hospital work-up and evaluation

## 2020-11-20 NOTE — ED Notes (Signed)
Paged Kary Kos NP regarding troponin

## 2020-11-20 NOTE — ED Notes (Addendum)
Pt transported to CT at this time.

## 2020-11-20 NOTE — Telephone Encounter (Signed)
Appreciate the update. Will follow along.   Pt has revived but is no DNR if she codes again.

## 2020-11-20 NOTE — ED Notes (Signed)
Family at bedside. 

## 2020-11-20 NOTE — ED Provider Notes (Signed)
[facility] EMERGENCY DEPARTMENT Provider Note   CSN: [id number] Arrival date & time: 11-08-2020  1045  LEVEL 5 CAVEAT - CPR  History Chief Complaint  Patient presents with   Cardiac Arrest    [name] is a 87 y.o. female.  HPI 87 year old female presents in cardiac arrest.  History is initially from EMS and later from the son and nurse aides.  Patient has been dealing with a UTI and was on Keflex.  This morning around 6:30 AM she seemed to be acutely worse and was very weak and having respiratory issues.  9:30 AM EMS was called and when they found her she was having a hard time breathing and became agonal in the truck and they started CPR after finding no pulses.  40 minutes of CPR with 6 epinephrines, 2 shocks (1 for ventricular tachycardia and 1 for V. fib) as well as amiodarone.  To have not had return of spontaneous circulation.   Past Medical History:  Diagnosis Date   Chronic anticoagulation    Chronic atrial fibrillation ([place]) 01/28/2017   Chronic venous insufficiency 01/28/2017   Clavicle fracture 11/20/2017   Long term current use of anticoagulant therapy 01/28/2017   Osteoarthritis    Presence of permanent cardiac pacemaker 01/28/2017   Original implant 1992 [employer] 431-04 Generator change 2001 and 2011.  Medtronic generator.    Severe aortic stenosis    SSS (sick sinus syndrome) (HCC)    Thyroid disease    hypothyroidism   TIA (transient ischemic attack)     Patient Active Problem List   Diagnosis Date Noted   Cardiac arrest ([place]) 08-Nov-2020   Acute respiratory failure with hypoxia (HCC)    Cerebral hemorrhage (HCC)    Recurrent falls 10/28/2020   Unspecified abnormal findings in urine  10/28/2020   Left-sided weakness    Osteoporosis 09/29/2020   Moderate dementia with behavioral disturbance (HCC) 09/04/2020   Severe aortic stenosis    Aortic stenosis 07/15/2020   CHF (congestive heart failure) ([place]) 06/05/2020   Left  sided abdominal pain 05/13/2020   Sinus congestion 12/18/2019   Dementia without behavioral disturbance ([place]) 07/31/2019   Sick sinus syndrome ([place]) 07/28/2019   Epigastric pain 06/12/2019   Localized swelling of left lower leg 04/11/2019   Memory loss 04/11/2019   Prediabetes 03/14/2019   Complete heart block ([place]) 03/05/2019   TIA (transient ischemic attack) 01/15/2019   Gait abnormality 01/15/2019   Transient ischemia 12/13/2018   Dysarthria 12/11/2018   Hypothyroidism 12/11/2018   Arthritis of right hand 08/17/2018   Chronic back pain 04/11/2018   Encounter for therapeutic drug monitoring 03/09/2018   Hypertension 01/04/2018   Clavicle fracture 11/20/2017   Closed fracture of clavicle 11/20/2017   Fall    Accelerated hypertension    Carpal tunnel syndrome of left wrist 08/02/2017   Carpal tunnel syndrome of right wrist 08/02/2017   Bradycardia 02/02/2017   Presence of permanent cardiac pacemaker 01/28/2017   Chronic a-fib ([place]) 01/28/2017   Long term current use of anticoagulant therapy 01/28/2017   Chronic venous insufficiency 01/28/2017   Chronic atrial fibrillation ([place]) 01/28/2017   Peripheral venous insufficiency 01/28/2017   Cardiac pacemaker in situ 01/28/2017   Anticoagulated 01/28/2017   DOE (dyspnea on exertion) 09/22/2015   Dyspnea on exertion 09/22/2015   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 05/15/2014   After cataract of left eye not obscuring vision 05/15/2014   Bilateral dry eyes 05/15/2014   Dermatochalasis of eyelid 05/15/2014  Hyperopia of both eyes with astigmatism and presbyopia 05/15/2014   Irregular astigmatism of right eye 05/15/2014   Mechanical complication due to intraocular lens implant 05/15/2014   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   Vitreous syneresis of both eyes 05/15/2014    Past Surgical History:  Procedure Laterality Date   ABDOMINAL HYSTERECTOMY     INSERT / REPLACE / REMOVE PACEMAKER     generator change 2011 medtronic sigma   KNEE  SURGERY     LEAD INSERTION N/A 03/05/2019   Procedure: LEAD INSERTION - RV LEAD;  Surgeon: Deboraha Sprang, MD;  Location: [facility];  Service: Cardiovascular;  Laterality: N/A;   PACEMAKER IMPLANT N/A 03/05/2019   Procedure: PACEMAKER IMPLANT;  Surgeon: Deboraha Sprang, MD;  Location: [facility];  Service: Cardiovascular;  Laterality: N/A;   PACEMAKER INSERTION     PARS PLANA VITRECTOMY Right 07/19/2018   Procedure: VITRECTOMY WITH VITREOUS BIPOSY & ENDOLASER;  Surgeon: Jalene Mullet, MD;  Location: [facility];  Service: Ophthalmology;  Laterality: Right;   PPM GENERATOR CHANGEOUT N/A 02/02/2017   Procedure: PPM GENERATOR CHANGEOUT;  Surgeon: Deboraha Sprang, MD;  Location: [facility];  Service: Cardiovascular;  Laterality: N/A;   REPLACEMENT TOTAL KNEE BILATERAL     RIGHT/LEFT HEART CATH AND CORONARY ANGIOGRAPHY N/A 07/31/2020   Procedure: RIGHT/LEFT HEART CATH AND CORONARY ANGIOGRAPHY;  Surgeon: Burnell Blanks, MD;  Location: [facility];  Service: Cardiovascular;  Laterality: N/A;   VARICOSE VEIN SURGERY       OB History     Gravida  3   Para  3   Term      Preterm      AB      Living  2      SAB      IAB      Ectopic      Multiple      Live Births              Family History  Problem Relation Age of Onset   Diabetes Mother    Mental illness Mother    Diabetes Father    Heart disease Father     Social History   Tobacco Use   Smoking status: Former    Packs/day: 0.25    Years: 10.00    Pack years: 2.50    Types: Cigarettes    Quit date: 1980    Years since quitting: 42.6   Smokeless tobacco: Never  Vaping Use   Vaping Use: Never used  Substance Use Topics   Alcohol use: No   Drug use: No    Home Medications Prior to Admission medications   Medication Sig Start Date End Date Taking? Authorizing Provider  acetaminophen (TYLENOL) 500 MG tablet Take 1,000 mg by mouth every 8 (eight) hours as needed for  moderate pain or headache.     [provider]  aspirin EC 81 MG EC tablet Take 1 tablet (81 mg total) by mouth daily. Swallow whole. 10/16/20   Geradine Girt, DO  atorvastatin (LIPITOR) 20 MG tablet TAKE 1 TABLET BY MOUTH EVERY DAY IN THE EVENING 06/30/20   [name], MD  Carboxymethylcell-Hypromellose (GENTEAL OP) Place 1 drop into both eyes daily as needed (dry eyes).    [provider]  cephALEXin (KEFLEX) 500 MG capsule Take 1 capsule (500 mg total) by mouth 2 (two) times daily for 7 days. 11/03/20 11/10/20  [name], MD  cetirizine (  ZYRTEC) 5 MG tablet TAKE 1 TABLET BY MOUTH EVERY DAY 04/18/20   [name], MD  D 2000 50 MCG ([phone] UT) TABS TAKE 1 TABLET BY MOUTH EVERY DAY 07/07/20   [name], MD  donepezil (ARICEPT) 10 MG tablet Take 1 tablet daily 04/18/20   [place] Sprang, MD  ELIQUIS 5 MG TABS tablet TAKE 1 TABLET BY MOUTH TWICE A DAY 08/15/20   [name], MD  fluticasone (FLONASE) 50 MCG/ACT nasal spray Place 1 spray into both nostrils daily as needed for allergies or rhinitis. 10/28/20   [name], MD  furosemide (LASIX) 20 MG tablet TAKE 1 TABLET BY MOUTH EVERY DAY 08/21/20   [name], MD  levothyroxine (SYNTHROID) 88 MCG tablet TAKE 1 TABLET BY MOUTH EVERY DAY IN THE MORNING ON AN EMPTY STOMACH 08/04/20   [name], MD  lisinopril (ZESTRIL) 5 MG tablet Take 1 tablet (5 mg total) by mouth daily. 10/06/20   [name], MD  oxybutynin (DITROPAN-XL) 5 MG 24 hr tablet Take 1 tablet (5 mg total) by mouth daily. 10/28/20   [name], MD  sertraline (ZOLOFT) 50 MG tablet Take 1 tablet (50 mg total) by mouth at bedtime. 05/08/20   [place] Sprang, MD    Allergies    Adhesive [tape] and Penicillins  Review of Systems   Review of Systems  Unable to perform ROS: Patient unresponsive   Physical Exam Updated Vital Signs BP 122/88   Pulse 70   Temp (!) 96.5 F (35.8 C) (Temporal)   Resp 20   Ht '5\' 2"'$  (1.575 m)   SpO2 100%    BMI 25.61 kg/m   Physical Exam Vitals and nursing note reviewed.  Constitutional:      Appearance: She is well-developed.     Interventions: She is intubated.  HENT:     Head: Normocephalic and atraumatic.     Right Ear: External ear normal.     Left Ear: External ear normal.     Nose: Nose normal.  Eyes:     General:        Right eye: No discharge.        Left eye: No discharge.     Comments: Pupils mid-sized and non-reactive  Cardiovascular:     Rate and Rhythm: Normal rate. Rhythm irregular.     Heart sounds: Normal heart sounds.  Pulmonary:     Effort: She is intubated.     Breath sounds: Normal breath sounds.  Abdominal:     General: There is no distension.     Palpations: Abdomen is soft.  Skin:    General: Skin is warm and dry.  Neurological:     Mental Status: She is unresponsive.     GCS: GCS eye subscore is 1. GCS verbal subscore is 1. GCS motor subscore is 1.  Psychiatric:        Mood and Affect: Mood is not anxious.    ED Results / Procedures / Treatments   Labs (all labs ordered are listed, but only abnormal results are displayed) Labs Reviewed  CBC WITH DIFFERENTIAL/PLATELET - Abnormal; Notable for the following components:      Result Value   WBC 20.9 (*)    RBC 3.46 (*)    Hemoglobin 9.2 (*)    HCT 31.9 (*)    MCHC 28.8 (*)    RDW 19.8 (*)    Neutro Abs 17.0 (*)    Monocytes Absolute  1.1 (*)    Abs Immature Granulocytes 1.18 (*)    All other components within normal limits  COMPREHENSIVE METABOLIC PANEL - Abnormal; Notable for the following components:   Sodium 131 (*)    Chloride 97 (*)    CO2 20 (*)    Glucose, Bld 297 (*)    Creatinine, Ser 1.54 (*)    Calcium 8.0 (*)    Total Protein 5.2 (*)    Albumin 2.5 (*)    AST 104 (*)    ALT 70 (*)    Total Bilirubin 1.6 (*)    GFR, Estimated 32 (*)    All other components within normal limits  BRAIN NATRIURETIC PEPTIDE - Abnormal; Notable for the following components:   B Natriuretic  Peptide 458.1 (*)    All other components within normal limits  LACTIC ACID, PLASMA - Abnormal; Notable for the following components:   Lactic Acid, Venous 9.0 (*)    All other components within normal limits  LACTIC ACID, PLASMA - Abnormal; Notable for the following components:   Lactic Acid, Venous 7.4 (*)    All other components within normal limits  PROTIME-INR - Abnormal; Notable for the following components:   Prothrombin Time 29.9 (*)    INR 2.9 (*)    All other components within normal limits  BLOOD GAS, ARTERIAL - Abnormal; Notable for the following components:   pH, Arterial 7.238 (*)    pO2, Arterial 309 (*)    Bicarbonate 18.2 (*)    Acid-base deficit 7.9 (*)    All other components within normal limits  I-STAT VENOUS BLOOD GAS, ED - Abnormal; Notable for the following components:   pH, Ven 7.091 (*)    pCO2, Ven 65.4 (*)    pO2, Ven 64.0 (*)    Bicarbonate 19.9 (*)    Acid-base deficit 10.0 (*)    Sodium 132 (*)    Calcium, Ion 1.07 (*)    HCT 29.0 (*)    Hemoglobin 9.9 (*)    All other components within normal limits  CBG MONITORING, ED - Abnormal; Notable for the following components:   Glucose-Capillary 267 (*)    All other components within normal limits  I-STAT CHEM 8, ED - Abnormal; Notable for the following components:   Sodium 131 (*)    Chloride 96 (*)    BUN 25 (*)    Creatinine, Ser 1.40 (*)    Glucose, Bld 288 (*)    Calcium, Ion 1.08 (*)    Hemoglobin 9.9 (*)    HCT 29.0 (*)    All other components within normal limits  TROPONIN I (HIGH SENSITIVITY) - Abnormal; Notable for the following components:   Troponin I (High Sensitivity) 96 (*)    All other components within normal limits  TROPONIN I (HIGH SENSITIVITY) - Abnormal; Notable for the following components:   Troponin I (High Sensitivity) 468 (*)    All other components within normal limits  RESP PANEL BY RT-PCR (FLU A&B, COVID) ARPGX2  CULTURE, BLOOD (ROUTINE X 2)  CULTURE, BLOOD  (ROUTINE X 2)  URINE CULTURE  MAGNESIUM  PROCALCITONIN  URINALYSIS, ROUTINE W REFLEX MICROSCOPIC  BLOOD GAS, ARTERIAL  CORTISOL  TYPE AND SCREEN    EKG None  Radiology CT HEAD WO CONTRAST (5MM)  Result Date: Nov 17, 2020 CLINICAL DATA:  Altered mental status EXAM: CT HEAD WITHOUT CONTRAST TECHNIQUE: Contiguous axial images were obtained from the base of the skull through the vertex without intravenous contrast. COMPARISON:  10/16/2020  FINDINGS: Brain: Large intraparenchymal hemorrhage, centered in the left cerebellar hemisphere, measuring up to 6.1 x 4.0 x 4.0 cm, with surrounding edema and effacement of the fourth ventricle. In addition there is hemorrhage seen along the inferior aspect of the tentorium, likely subdural, hemorrhage layering in the occipital horns of lateral ventricles, as well as likely subarachnoid hemorrhage in the left parietal lobe and a small focus of intraparenchymal hemorrhage in the left temporal lobe. Overall unchanged ventricular size and configuration compared to 10/08/2020. No midline shift. Vascular: No hyperdense vessel or unexpected calcification. Skull: Normal. Negative for fracture or focal lesion. Sinuses/Orbits: Mucosal thickening in the ethmoid air cells. Status post bilateral lens replacement. Other: None. IMPRESSION: Large intraparenchymal hemorrhage centered in the left cerebellar hemisphere, with effacement of the fourth ventricle and extension to the infratentorial subdural space. Wearing hemorrhage in the occipital horns of the lateral ventricles, without hydrocephalus. In addition there is subarachnoid and intraparenchymal hemorrhage in left parietal and temporal lobes, respectively. These results were called by telephone at the time of interpretation on 10-Nov-2020 at 1:17 pm to provider Sherwood Gambler , who verbally acknowledged these results. Electronically Signed   By: Merilyn Baba M.D.   On: November 10, 2020 13:22   DG Chest Portable 1 View  Result Date:  10-Nov-2020 CLINICAL DATA:  Cardiac arrest. EXAM: PORTABLE CHEST 1 VIEW COMPARISON:  Chest x-ray dated September 26, 2020. FINDINGS: Endotracheal tube tip in good position 2.9 cm above the carina. Unchanged left chest wall pacemaker. Unchanged moderate cardiomegaly. Slightly worsened diffuse interstitial thickening. No focal consolidation, pleural effusion, or pneumothorax. No acute osseous abnormality. IMPRESSION: 1. Appropriately positioned endotracheal tube. 2. Slightly worsened pulmonary interstitial edema. Electronically Signed   By: Titus Dubin M.D.   On: 11/10/2020 11:35    Procedures .Critical Care  Date/Time: 2020/11/10 3:32 PM Performed by: Sherwood Gambler, MD Authorized by: Sherwood Gambler, MD   Critical care provider statement:    Critical care time (minutes):  60   Critical care time was exclusive of:  Separately billable procedures and treating other patients   Critical care was necessary to treat or prevent imminent or life-threatening deterioration of the following conditions:  Cardiac failure and CNS failure or compromise   Critical care was time spent personally by me on the following activities:  Discussions with consultants, evaluation of patient's response to treatment, examination of patient, ordering and performing treatments and interventions, ordering and review of laboratory studies, ordering and review of radiographic studies, pulse oximetry, re-evaluation of patient's condition, obtaining history from patient or surrogate and review of old charts   Cardiopulmonary Resuscitation (CPR) Procedure Note Directed/Performed by: Ephraim Hamburger I personally directed ancillary staff and/or performed CPR in an effort to regain return of spontaneous circulation and to maintain cardiac, neuro and systemic perfusion.    Medications Ordered in ED Medications  EPINEPHrine (ADRENALIN) 5 mg in NS 250 mL (0.02 mg/mL) premix infusion (0 mcg/min Intravenous Stopped 2020/11/10 1135)   norepinephrine (LEVOPHED) '4mg'$  in 23m premix infusion (7 mcg/min Intravenous Infusion Verify 808/22/221355)  docusate sodium (COLACE) capsule 100 mg (has no administration in time range)  polyethylene glycol (MIRALAX / GLYCOLAX) packet 17 g (has no administration in time range)  dextrose 5 % solution (has no administration in time range)  acetaminophen (TYLENOL) tablet 650 mg (has no administration in time range)    Or  acetaminophen (TYLENOL) suppository 650 mg (has no administration in time range)  diphenhydrAMINE (BENADRYL) injection 25 mg (has no administration in time range)  glycopyrrolate (ROBINUL) tablet 1 mg (has no administration in time range)    Or  glycopyrrolate (ROBINUL) injection 0.2 mg (has no administration in time range)    Or  glycopyrrolate (ROBINUL) injection 0.2 mg (has no administration in time range)  polyvinyl alcohol (LIQUIFILM TEARS) 1.4 % ophthalmic solution 1 drop (has no administration in time range)  morphine 2 MG/ML injection 2 mg (has no administration in time range)  morphine bolus via infusion 5 mg (has no administration in time range)  morphine '100mg'$  in NS 18m ('1mg'$ /mL) infusion - premix (has no administration in time range)  LORazepam (ATIVAN) injection 2-4 mg (has no administration in time range)  albuterol (PROVENTIL) (2.5 MG/3ML) 0.083% nebulizer solution 2.5 mg (has no administration in time range)  ondansetron (ZOFRAN-ODT) disintegrating tablet 4 mg (has no administration in time range)    Or  ondansetron (ZOFRAN) injection 4 mg (has no administration in time range)  sodium chloride 0.9 % bolus 1,000 mL (0 mLs Intravenous Stopped [id number])  calcium gluconate 1 g/ 50 mL sodium chloride IVPB (0 g Intravenous Stopped [id number])  cefTRIAXone (ROCEPHIN) 1 g in sodium chloride 0.9 % 100 mL IVPB (0 g Intravenous Stopped 808/19/20221249)    ED Course  I have reviewed the triage vital signs and the nursing notes.  Pertinent labs & imaging results  that were available during my care of the patient were reviewed by me and considered in my medical decision making (see chart for details).    MDM Rules/Calculators/A&P                           Patient presents in cardiac arrest.  She was in arrest for about 40 minutes.  1 round of CPR performed here and she was surprisingly returned to spontaneous circulation.  Thus she was placed on pressors as she was also in shock.  I was able to talk to family and they think she might of had a previous DNR but the power of attorney is the daughter and wants her to be kept alive until she can see her.  Son is here at the bedside.  Further work-up shows that she has severe acidosis, likely lactate and the respiratory from being down.  Unfortunately her CT shows a sizable cerebellar hemorrhage which probably was the ultimate cause given that the caregivers are now saying that she was nauseated prior to her becoming unresponsive.  Currently she is metastable while on pressors but I have discussed her very poor prognosis with family.  They would like no escalation in care.  She will be admitted to the ICU team. Final Clinical Impression(s) / ED Diagnoses Final diagnoses:  Cardiac arrest ([facility]  Acute cerebellar hemorrhage ([facility]    Rx / DC Orders ED Discharge Orders     None        GSherwood Gambler MD [id number]

## 2020-11-20 NOTE — ED Notes (Signed)
RT at bedside to extubate pt.

## 2020-11-20 NOTE — Progress Notes (Signed)
Per order, pt was extubated to comfort care.

## 2020-11-20 NOTE — ED Notes (Signed)
Attempted report x1. 

## 2020-11-20 NOTE — Telephone Encounter (Signed)
[facility] is not taking this patient  [facility] is able to accept - pt is currently being Hospitalized due to Cardiac Arrest in home this morning.  Will hold off on [facility] referral until patient is D/c home from Hospital.   PCP to be made aware.

## 2020-11-20 NOTE — Progress Notes (Signed)
I spoke with the patient's son, CLARYSSA HILDE. Per the patient's son, the patient signed a DNR in 2010.  Per the son, who has spoken to his sister, the POA, if patient arrests again, they do not want CPR, defibrillation or ACLS medications.  In the event the patient does not arrest, they want [place] to maintain current level of care. Daughter, [name] , is on her way from [place].The hope is that the patient will hold on until the daughter from [facility] arrives.   Magdalen Spatz, MSN, AGACNP-BC [place] for personal pager PCCM on call pager [phone]  2020-11-06 12:50 PM

## 2020-11-20 NOTE — Telephone Encounter (Signed)
Mrs. [name] calls back to notify us  that patient coded while EMS was on scene and they were taking her now to the hospital.  Family and caregiver are following.  FYI To Dr. Einar Pheasant

## 2020-11-20 NOTE — Telephone Encounter (Signed)
Mrs. Javier Glazier called back in and wanted to speak to the nurse she spoke with, which was [facility]

## 2020-11-20 NOTE — Telephone Encounter (Signed)
[name] is hoping to accept this patient Will update once I know more

## 2020-11-20 NOTE — ED Notes (Signed)
Pt extubated by RT

## 2020-11-20 NOTE — H&P (Signed)
NAME:  [name], MRN:  [id number], DOB:  [birth year], LOS: 0 ADMISSION DATE:  2020/12/03, CONSULTATION DATE:  December 03, 2020 REFERRING MD:  Regenia Skeeter, CHIEF COMPLAINT:  Post Cardiac Arrest   History of Present Illness:  87 year old female presents in cardiac arrest.  History is initially from EMS and later from the son and nurse aides.  Patient has been dealing with a UTI and was on Keflex as an outpatient .  This morning around 6:30 AM she seemed to be acutely worse and was very weak and having respiratory issues.  9:30 AM EMS was called and when they found her she was having a hard time breathing and became agonal in the truck and they started CPR after finding no pulses.  40 minutes of CPR with 6 epinephrines, 2 shocks (1 for ventricular tachycardia and 1 for V. fib) as well as amiodarone.  In the Ed she was intubated for ABG>> 7.091/65.4/64/19.9 and 82% saturation. Repeat ABG post intubation >> 7.23/44/309. She was hypotensive and received 1 L of fluid. CXR was done which showed Unchanged moderate cardiomegaly. Slightly worsened diffuse interstitial thickening. No infiltrate.  Labs in the ED: Na 131, K 3.8, Creatinine initially 1.54, on repeat 1.4, ionized Calcium 1.08, Cl 97, CO2 20,  Glucose 297 Mag 2.4, AST 104, ALT 70, Total Bili 1.6 Troponin 96, Lactate 9.0 INR of 2.9, HGB of 9.2 , WBC of 20.9, platelets 189  Family were approached about Goals of Care, and at the time wanted to maintain current level of care until POA ( Daughter ) could arrive from [facility]. However, I spoke with family about making the patient a no CPR in the event she cardiac arrests prior to daughter arriving, as the patient had signed a DNR in 2010. The son was agreeable, and patient was made a DNR. She is already intubated and the family want to maintain current level of care.  Subsequently CT Head was completed which showed >>  Large intraparenchymal hemorrhage centered in the left cerebellar hemisphere, with  effacement of the fourth ventricle and extension to the infratentorial subdural space. Wearing hemorrhage in the occipital horns of the lateral ventricles, without hydrocephalus. In addition there is subarachnoid and intraparenchymal hemorrhage in left parietal and temporal lobes, respectively.  Dr. Regenia Skeeter ED spoke with the family regarding the CT Head findings ,and they  want to maintain current level of care until all family arrive to make any further decisions about goals of care vs comfort care.   PCCM have been asked to admit to ICU and take over care.   Pertinent  Medical History   Past Medical History:  Diagnosis Date   Chronic anticoagulation    Chronic atrial fibrillation ([place]) 01/28/2017   Chronic venous insufficiency 01/28/2017   Clavicle fracture 11/20/2017   Long term current use of anticoagulant therapy 01/28/2017   Osteoarthritis    Presence of permanent cardiac pacemaker 01/28/2017   Original implant 1992 [employer] 431-04 Generator change 2001 and 2011.  Medtronic generator.    Severe aortic stenosis    SSS (sick sinus syndrome) (HCC)    Thyroid disease    hypothyroidism   TIA (transient ischemic attack)      Significant Hospital Events: Including procedures, antibiotic start and stop dates in addition to other pertinent events   8/16 Cardiac Arrest and Large intraparenchymal hemorrhage  8/16 Intubated  Interim History / Subjective:  Remains intubated, on Levophed  BP is soft Net + 1200 cc's No Urine output  Objective  Blood pressure 114/79, pulse (!) 101, temperature (!) 96.5 F (35.8 C), temperature source Temporal, resp. rate 18, height '5\' 2"'$  (1.575 m), SpO2 100 %.    Vent Mode: PRVC FiO2 (%):  [100 %] 100 % Set Rate:  [18 bmp-24 bmp] 24 bmp Vt Set:  [430 mL] 430 mL PEEP:  [5 cmH20] 5 cmH20 Plateau Pressure:  [17 cmH20] 17 cmH20   Intake/Output Summary (Last 24 hours) at 11/27/2020 1303 Last data filed at Nov 27, 2020 1300 Gross per 24  hour  Intake 1180.64 ml  Output --  Net 1180.64 ml   There were no vitals filed for this visit.  Examination: General: Critically ill Elderly female, Intubated , Unresponsive , no sedation  HENT: NCAT, MM Pink and dry, No LAD, Trace JVD Lungs: Bilateral Chest Excursion, clear upper lobes with crackles noted per bases Cardiovascular: S1, S2, Irr, A-fib per tele with PVC's and PAC's Abdomen: Soft, Non tender, ND, BS  diminished  Extremities: Cool to touch with adequate capillary refill , no obvious deformities Neuro: Unresponsive , no sedation , Not moving extremities on my assessment  GU: Not assessed  Resolved Hospital Problem list     Assessment & Plan:  Large intraparenchymal hemorrhage centered in the left cerebellar hemisphere, with effacement of the fourth ventricle and extension to the infratentorial subdural space. Subarachnoid and intraparenchymal hemorrhage in left parietal and temporal lobes. Plan DNR established  Goals of Care vs Comfort Care once family arrives  Monitor for Seizures, and treat as needed  Will consider Neuro Consult if family decide against comfort care   Cardiac Arrest 2/2 Large intraparenchymal hemorrhage  Received 40 minutes of CPR in Ambulance Troponin 468 Plan Will hold off on Echo until Family has goals of Care vs [place]  Not a candidate for cooling  Tele monitoring Trend Troponins Titrate Levophed for MAP of 65 mm Hg  Acute Respiratory Failure in setting of intraparenchymal hemorrhage and Cardiac Arrest Pulmonary Edema per CXR Former smoker  Plan Goals of Care vs Comfort Care discussion with family Titrate oxygen for sats of > 94%, Adjust rate and PEEP using ABG as  guide CXR in am and prn  ABG in am and prn  Maintain Mag > 2   AKI in setting of Cardiac Arrest Improving with fluid resuscitation Plan Trend BMET Replete electrolytes as needed  Monitor UO Consider Renal Consult if patient is not made comfort  care Avoid nephrotoxic medications Maintain renal perfusion        Best Practice (right click and "Reselect all SmartList Selections" daily)   Diet/type: NPO DVT prophylaxis: PAS hose  GI prophylaxis: H2B Lines: N/A Foley:  Yes, and it is still needed Code Status:  DNR Last date of multidisciplinary goals of care discussion [11-27-20 with Son and family to discuss code status  Labs   CBC: Recent Labs  Lab 27-Nov-2020 1050 11-27-2020 1121  WBC 20.9*  --   NEUTROABS PENDING  --   HGB 9.2* 9.9*  9.9*  HCT 31.9* 29.0*  29.0*  MCV 92.2  --   PLT 189  --     Basic Metabolic Panel: Recent Labs  Lab November 27, 2020 1050 11/27/2020 1121  NA 131* 132*  131*  K 3.8 3.7  3.7  CL 97* 96*  CO2 20*  --   GLUCOSE 297* 288*  BUN 22 25*  CREATININE 1.54* 1.40*  CALCIUM 8.0*  --   MG 2.4  --    GFR: Estimated Creatinine Clearance: 24.3 mL/min (  A) (by C-G formula based on SCr of 1.4 mg/dL (H)). Recent Labs  Lab 11/30/2020 1050 30-Nov-2020 1053  WBC 20.9*  --   LATICACIDVEN  --  9.0*    Liver Function Tests: Recent Labs  Lab 2020-11-30 1050  AST 104*  ALT 70*  ALKPHOS 52  BILITOT 1.6*  PROT 5.2*  ALBUMIN 2.5*   No results for input(s): LIPASE, AMYLASE in the last 168 hours. No results for input(s): AMMONIA in the last 168 hours.  ABG    Component Value Date/Time   PHART 7.396 07/31/2020 1303   PCO2ART 41.0 07/31/2020 1303   PO2ART 94 07/31/2020 1303   HCO3 19.9 (L) 2020/11/30 1121   TCO2 22 11/30/20 1121   TCO2 23 November 30, 2020 1121   ACIDBASEDEF 10.0 (H) 11/30/20 1121   O2SAT 82.0 11/30/20 1121     Coagulation Profile: Recent Labs  Lab 30-Nov-2020 1050  INR 2.9*    Cardiac Enzymes: No results for input(s): CKTOTAL, CKMB, CKMBINDEX, TROPONINI in the last 168 hours.  HbA1C: Hgb A1c MFr Bld  Date/Time Value Ref Range Status  10/16/2020 03:11 AM 5.8 (H) 4.8 - 5.6 % Final    Comment:    (NOTE)         Prediabetes: 5.7 - 6.4         Diabetes: >6.4          Glycemic control for adults with diabetes: <7.0   04/10/2020 10:11 AM 6.1 4.6 - 6.5 % Final    Comment:    Glycemic Control Guidelines for People with Diabetes:Non Diabetic:  <6%Goal of Therapy: <7%Additional Action Suggested:  >8%     CBG: Recent Labs  Lab 11/30/2020 1048  [place]*    Review of Systems:   Unable  Past Medical History:  She,  has a past medical history of Chronic anticoagulation, Chronic atrial fibrillation ([place]) (01/28/2017), Chronic venous insufficiency (01/28/2017), Clavicle fracture (11/20/2017), Long term current use of anticoagulant therapy (01/28/2017), Osteoarthritis, Presence of permanent cardiac pacemaker (01/28/2017), Severe aortic stenosis, SSS (sick sinus syndrome) ([place]), Thyroid disease, and TIA (transient ischemic attack).   Surgical History:   Past Surgical History:  Procedure Laterality Date   ABDOMINAL HYSTERECTOMY     INSERT / REPLACE / REMOVE PACEMAKER     generator change 2011 medtronic sigma   KNEE SURGERY     LEAD INSERTION N/A 03/05/2019   Procedure: LEAD INSERTION - RV LEAD;  Surgeon: Deboraha Sprang, MD;  Location: [facility];  Service: Cardiovascular;  Laterality: N/A;   PACEMAKER IMPLANT N/A 03/05/2019   Procedure: PACEMAKER IMPLANT;  Surgeon: Deboraha Sprang, MD;  Location: [facility];  Service: Cardiovascular;  Laterality: N/A;   PACEMAKER INSERTION     PARS PLANA VITRECTOMY Right 07/19/2018   Procedure: VITRECTOMY WITH VITREOUS BIPOSY & ENDOLASER;  Surgeon: Jalene Mullet, MD;  Location: [facility];  Service: Ophthalmology;  Laterality: Right;   PPM GENERATOR CHANGEOUT N/A 02/02/2017   Procedure: PPM GENERATOR CHANGEOUT;  Surgeon: Deboraha Sprang, MD;  Location: [facility];  Service: Cardiovascular;  Laterality: N/A;   REPLACEMENT TOTAL KNEE BILATERAL     RIGHT/LEFT HEART CATH AND CORONARY ANGIOGRAPHY N/A 07/31/2020   Procedure: RIGHT/LEFT HEART CATH AND CORONARY ANGIOGRAPHY;  Surgeon: Burnell Blanks,  MD;  Location: [facility];  Service: Cardiovascular;  Laterality: N/A;   VARICOSE VEIN SURGERY       Social History:   reports that she quit smoking about 42 years ago.  Her smoking use included cigarettes. She has a 2.50 pack-year smoking history. She has never used smokeless tobacco. She reports that she does not drink alcohol and does not use drugs.   Family History:  Her family history includes Diabetes in her father and mother; Heart disease in her father; Mental illness in her mother.   Allergies Allergies  Allergen Reactions   Adhesive [Tape] Other (See Comments)    TAPE WILL TEAR THE SKIN!!!!   Penicillins Hives and Rash    Did it involve swelling of the face/tongue/throat, SOB, or low BP? No Did it involve sudden or severe rash/hives, skin peeling, or any reaction on the inside of your mouth or nose? Yes Did you need to seek medical attention at a hospital or doctor's office? Unknown  When did it last happen?      unknown  If all above answers are "NO", may proceed with cephalosporin use.      Home Medications  Prior to Admission medications   Medication Sig Start Date End Date Taking? Authorizing Provider  acetaminophen (TYLENOL) 500 MG tablet Take 1,000 mg by mouth every 8 (eight) hours as needed for moderate pain or headache.     [provider]  aspirin EC 81 MG EC tablet Take 1 tablet (81 mg total) by mouth daily. Swallow whole. 10/16/20   Geradine Girt, DO  atorvastatin (LIPITOR) 20 MG tablet TAKE 1 TABLET BY MOUTH EVERY DAY IN THE EVENING 06/30/20   [name], MD  Carboxymethylcell-Hypromellose (GENTEAL OP) Place 1 drop into both eyes daily as needed (dry eyes).    [provider]  cephALEXin (KEFLEX) 500 MG capsule Take 1 capsule (500 mg total) by mouth 2 (two) times daily for 7 days. 11/03/20 11/10/20  [name], MD  cetirizine (ZYRTEC) 5 MG tablet TAKE 1 TABLET BY MOUTH EVERY DAY 04/18/20   [name], MD  D 2000 50 MCG [phone]  UT) TABS TAKE 1 TABLET BY MOUTH EVERY DAY 07/07/20   [name], MD  donepezil (ARICEPT) 10 MG tablet Take 1 tablet daily 04/18/20   Cameron Sprang, MD  ELIQUIS 5 MG TABS tablet TAKE 1 TABLET BY MOUTH TWICE A DAY 08/15/20   [name], MD  fluticasone (FLONASE) 50 MCG/ACT nasal spray Place 1 spray into both nostrils daily as needed for allergies or rhinitis. 10/28/20   [name], MD  furosemide (LASIX) 20 MG tablet TAKE 1 TABLET BY MOUTH EVERY DAY 08/21/20   [name], MD  levothyroxine (SYNTHROID) 88 MCG tablet TAKE 1 TABLET BY MOUTH EVERY DAY IN THE MORNING ON AN EMPTY STOMACH 08/04/20   [name], MD  lisinopril (ZESTRIL) 5 MG tablet Take 1 tablet (5 mg total) by mouth daily. 10/06/20   [name], MD  oxybutynin (DITROPAN-XL) 5 MG 24 hr tablet Take 1 tablet (5 mg total) by mouth daily. 10/28/20   [name], MD  sertraline (ZOLOFT) 50 MG tablet Take 1 tablet (50 mg total) by mouth at bedtime. 05/08/20   Cameron Sprang, MD     Critical care time: 16 minutes    Magdalen Spatz, MSN, AGACNP-BC [place] for personal pager PCCM on call pager [phone]  2020-11-09 2:50 PM

## 2020-11-20 DEATH — deceased

## 2021-01-30 ENCOUNTER — Ambulatory Visit: Payer: Medicare Other | Admitting: Physician Assistant

## 2021-04-09 ENCOUNTER — Ambulatory Visit: Payer: Medicare Other
